# Patient Record
Sex: Female | Born: 1950
Health system: Southern US, Community
[De-identification: ages and names within clinical notes are randomized; demographics above are authoritative.]

## PROBLEM LIST (undated history)

## (undated) DIAGNOSIS — I499 Cardiac arrhythmia, unspecified: Secondary | ICD-10-CM

## (undated) DIAGNOSIS — F329 Major depressive disorder, single episode, unspecified: Secondary | ICD-10-CM

## (undated) DIAGNOSIS — Z860100 Personal history of colon polyps, unspecified: Secondary | ICD-10-CM

## (undated) DIAGNOSIS — E119 Type 2 diabetes mellitus without complications: Secondary | ICD-10-CM

## (undated) DIAGNOSIS — I10 Essential (primary) hypertension: Secondary | ICD-10-CM

## (undated) DIAGNOSIS — N83201 Unspecified ovarian cyst, right side: Secondary | ICD-10-CM

## (undated) DIAGNOSIS — K069 Disorder of gingiva and edentulous alveolar ridge, unspecified: Secondary | ICD-10-CM

## (undated) DIAGNOSIS — H269 Unspecified cataract: Secondary | ICD-10-CM

## (undated) DIAGNOSIS — R7303 Prediabetes: Secondary | ICD-10-CM

## (undated) DIAGNOSIS — F32A Depression, unspecified: Secondary | ICD-10-CM

## (undated) DIAGNOSIS — K219 Gastro-esophageal reflux disease without esophagitis: Secondary | ICD-10-CM

## (undated) DIAGNOSIS — R011 Cardiac murmur, unspecified: Secondary | ICD-10-CM

## (undated) DIAGNOSIS — K760 Fatty (change of) liver, not elsewhere classified: Secondary | ICD-10-CM

## (undated) DIAGNOSIS — Z8619 Personal history of other infectious and parasitic diseases: Secondary | ICD-10-CM

## (undated) DIAGNOSIS — D649 Anemia, unspecified: Secondary | ICD-10-CM

## (undated) DIAGNOSIS — M1990 Unspecified osteoarthritis, unspecified site: Secondary | ICD-10-CM

## (undated) DIAGNOSIS — I809 Phlebitis and thrombophlebitis of unspecified site: Secondary | ICD-10-CM

## (undated) DIAGNOSIS — T7840XA Allergy, unspecified, initial encounter: Secondary | ICD-10-CM

## (undated) HISTORY — DX: Fatty (change of) liver, not elsewhere classified: K76.0

## (undated) HISTORY — PX: CYSTECTOMY: SUR359

## (undated) HISTORY — DX: Unspecified osteoarthritis, unspecified site: M19.90

## (undated) HISTORY — DX: Unspecified cataract: H26.9

## (undated) HISTORY — DX: Disorder of gingiva and edentulous alveolar ridge, unspecified: K06.9

## (undated) HISTORY — DX: Depression, unspecified: F32.A

## (undated) HISTORY — DX: Gastro-esophageal reflux disease without esophagitis: K21.9

## (undated) HISTORY — DX: Cardiac murmur, unspecified: R01.1

## (undated) HISTORY — DX: Personal history of colonic polyps: Z86.010

## (undated) HISTORY — DX: Prediabetes: R73.03

## (undated) HISTORY — DX: Major depressive disorder, single episode, unspecified: F32.9

## (undated) HISTORY — DX: Personal history of other infectious and parasitic diseases: Z86.19

## (undated) HISTORY — DX: Personal history of colon polyps, unspecified: Z86.0100

## (undated) HISTORY — DX: Allergy, unspecified, initial encounter: T78.40XA

## (undated) HISTORY — DX: Unspecified ovarian cyst, right side: N83.201

## (undated) HISTORY — PX: THERAPEUTIC ABORTION: SHX798

---

## 1999-08-24 ENCOUNTER — Emergency Department (HOSPITAL_COMMUNITY): Admission: EM | Admit: 1999-08-24 | Discharge: 1999-08-24 | Payer: Self-pay | Admitting: Emergency Medicine

## 2001-02-25 ENCOUNTER — Emergency Department (HOSPITAL_COMMUNITY): Admission: EM | Admit: 2001-02-25 | Discharge: 2001-02-26 | Payer: Self-pay | Admitting: Emergency Medicine

## 2002-04-16 ENCOUNTER — Encounter: Admission: RE | Admit: 2002-04-16 | Discharge: 2002-04-16 | Payer: Self-pay | Admitting: Internal Medicine

## 2002-04-16 ENCOUNTER — Encounter: Payer: Self-pay | Admitting: Internal Medicine

## 2002-04-30 ENCOUNTER — Encounter: Admission: RE | Admit: 2002-04-30 | Discharge: 2002-04-30 | Payer: Self-pay | Admitting: Internal Medicine

## 2002-04-30 ENCOUNTER — Encounter: Payer: Self-pay | Admitting: Internal Medicine

## 2003-05-14 ENCOUNTER — Encounter: Admission: RE | Admit: 2003-05-14 | Discharge: 2003-05-14 | Payer: Self-pay | Admitting: Internal Medicine

## 2004-05-16 ENCOUNTER — Encounter: Admission: RE | Admit: 2004-05-16 | Discharge: 2004-05-16 | Payer: Self-pay | Admitting: Internal Medicine

## 2005-05-17 ENCOUNTER — Encounter: Admission: RE | Admit: 2005-05-17 | Discharge: 2005-05-17 | Payer: Self-pay | Admitting: Internal Medicine

## 2007-11-15 ENCOUNTER — Ambulatory Visit (HOSPITAL_COMMUNITY): Admission: RE | Admit: 2007-11-15 | Discharge: 2007-11-15 | Payer: Self-pay | Admitting: Family Medicine

## 2007-11-15 ENCOUNTER — Ambulatory Visit: Payer: Self-pay | Admitting: Family Medicine

## 2007-11-15 LAB — CONVERTED CEMR LAB
ALT: 25 units/L (ref 0–35)
AST: 23 units/L (ref 0–37)
Albumin: 4.7 g/dL (ref 3.5–5.2)
Alkaline Phosphatase: 77 units/L (ref 39–117)
BUN: 8 mg/dL (ref 6–23)
Basophils Absolute: 0 10*3/uL (ref 0.0–0.1)
Basophils Relative: 0 % (ref 0–1)
CO2: 24 meq/L (ref 19–32)
Calcium: 9.2 mg/dL (ref 8.4–10.5)
Chloride: 107 meq/L (ref 96–112)
Cholesterol: 86 mg/dL (ref 0–200)
Creatinine, Ser: 0.81 mg/dL (ref 0.40–1.20)
Eosinophils Absolute: 0.1 10*3/uL (ref 0.0–0.7)
Eosinophils Relative: 3 % (ref 0–5)
Free T4: 1.14 ng/dL (ref 0.89–1.80)
Glucose, Bld: 88 mg/dL (ref 70–99)
HCT: 42.7 % (ref 36.0–46.0)
HDL: 50 mg/dL (ref >39)
Hemoglobin: 13.5 g/dL (ref 12.0–15.0)
LDL Cholesterol: 10 mg/dL (ref 0–99)
Lymphocytes Relative: 37 % (ref 12–46)
Lymphs Abs: 1.7 10*3/uL (ref 0.7–4.0)
MCHC: 31.6 g/dL (ref 30.0–36.0)
MCV: 88 fL (ref 78.0–100.0)
Monocytes Absolute: 0.4 10*3/uL (ref 0.1–1.0)
Monocytes Relative: 9 % (ref 3–12)
Neutro Abs: 2.4 10*3/uL (ref 1.7–7.7)
Neutrophils Relative %: 52 % (ref 43–77)
Platelets: 207 10*3/uL (ref 150–400)
Potassium: 3.7 meq/L (ref 3.5–5.3)
RBC: 4.85 M/uL (ref 3.87–5.11)
RDW: 14.2 % (ref 11.5–15.5)
Sodium: 142 meq/L (ref 135–145)
TSH: 2.556 microintl units/mL (ref 0.350–4.50)
Total Bilirubin: 0.4 mg/dL (ref 0.3–1.2)
Total CHOL/HDL Ratio: 1.7
Total Protein: 7.3 g/dL (ref 6.0–8.3)
Triglycerides: 130 mg/dL (ref <150)
VLDL: 26 mg/dL (ref 0–40)
WBC: 4.7 10*3/uL (ref 4.0–10.5)

## 2008-09-18 ENCOUNTER — Encounter: Admission: RE | Admit: 2008-09-18 | Discharge: 2008-09-18 | Payer: Self-pay | Admitting: Family Medicine

## 2009-12-24 ENCOUNTER — Encounter: Admission: RE | Admit: 2009-12-24 | Discharge: 2009-12-24 | Payer: Self-pay | Admitting: Family Medicine

## 2010-11-25 ENCOUNTER — Emergency Department (HOSPITAL_BASED_OUTPATIENT_CLINIC_OR_DEPARTMENT_OTHER)
Admission: EM | Admit: 2010-11-25 | Discharge: 2010-11-25 | Disposition: A | Discharge status: Home / Self Care | Payer: BC Managed Care – PPO | Attending: Emergency Medicine | Admitting: Emergency Medicine

## 2010-11-25 ENCOUNTER — Emergency Department (INDEPENDENT_AMBULATORY_CARE_PROVIDER_SITE_OTHER): Payer: BC Managed Care – PPO

## 2010-11-25 ENCOUNTER — Encounter: Payer: Self-pay | Admitting: Family Medicine

## 2010-11-25 ENCOUNTER — Other Ambulatory Visit: Payer: Self-pay

## 2010-11-25 DIAGNOSIS — E876 Hypokalemia: Secondary | ICD-10-CM

## 2010-11-25 DIAGNOSIS — R000 Tachycardia, unspecified: Secondary | ICD-10-CM | POA: Insufficient documentation

## 2010-11-25 DIAGNOSIS — R079 Chest pain, unspecified: Secondary | ICD-10-CM

## 2010-11-25 DIAGNOSIS — R002 Palpitations: Secondary | ICD-10-CM

## 2010-11-25 HISTORY — DX: Cardiac arrhythmia, unspecified: I49.9

## 2010-11-25 HISTORY — DX: Phlebitis and thrombophlebitis of unspecified site: I80.9

## 2010-11-25 HISTORY — DX: Essential (primary) hypertension: I10

## 2010-11-25 LAB — BASIC METABOLIC PANEL
BUN: 10 mg/dL (ref 6–23)
CO2: 28 mEq/L (ref 19–32)
Calcium: 10 mg/dL (ref 8.4–10.5)
Chloride: 99 mEq/L (ref 96–112)
Creatinine, Ser: 0.8 mg/dL (ref 0.50–1.10)
Glucose, Bld: 146 mg/dL — ABNORMAL HIGH (ref 70–99)
Potassium: 2.9 mEq/L — ABNORMAL LOW (ref 3.5–5.1)
Sodium: 140 mEq/L (ref 135–145)

## 2010-11-25 LAB — CARDIAC PANEL(CRET KIN+CKTOT+MB+TROPI)
CK, MB: 1.7 ng/mL (ref 0.3–4.0)
Relative Index: 1.5 (ref 0.0–2.5)

## 2010-11-25 LAB — CBC
Hemoglobin: 12.4 g/dL (ref 12.0–15.0)
MCHC: 33.5 g/dL (ref 30.0–36.0)
MCV: 83 fL (ref 78.0–100.0)
RBC: 4.46 MIL/uL (ref 3.87–5.11)

## 2010-11-25 MED ORDER — POTASSIUM CHLORIDE CRYS ER 20 MEQ PO TBCR
40.0000 meq | EXTENDED_RELEASE_TABLET | Freq: Once | ORAL | Status: AC
  Administered 2010-11-25: 40 meq via ORAL
  Filled 2010-11-25: qty 2

## 2010-11-25 MED ORDER — POTASSIUM CHLORIDE CRYS ER 20 MEQ PO TBCR: 20.0000 meq | EXTENDED_RELEASE_TABLET | Freq: Every day | ORAL | Status: DC

## 2010-11-25 NOTE — ED Provider Notes (Addendum)
History     CSN: [id number] Arrival date & time: 11/25/2010  3:48 PM  Chief Complaint  Patient presents with  . Tachycardia    (Consider location/radiation/quality/duration/timing/severity/associated sxs/prior treatment) HPI Comments: About one month ago, pt's lisinopril/HCTZ was increased, has felt more tired, dizzy at times.  One week ago, started metoprolol.  Has had these feelings that her heart is racing since then.  Usually, has no associated symptoms.  Had one episode of chest tightness today with palpitations  Patient is a 60 y.o. female presenting with palpitations.  Palpitations  This is a new problem. The current episode started more than 2 days ago. The problem occurs hourly. The problem has been gradually worsening. Associated with: since starting labetalol about a week ago. Associated symptoms include malaise/fatigue, chest pressure, lower extremity edema and shortness of breath. Pertinent negatives include no diaphoresis, no fever, no numbness, no chest pain, no near-syncope, no syncope, no abdominal pain, no nausea, no vomiting, no headaches, no back pain, no leg pain, no dizziness, no weakness, no cough and no hemoptysis. Associated symptoms comments: Pt usually has had no symptoms associated with the palpitations, today, around lunch, had one episode of chest tightness with racing heart. She has tried nothing for the symptoms. Risk factors include smoking/tobacco exposure (quit smoking one year ago). Her past medical history does not include heart disease.    Past Medical History  Diagnosis Date  . Hypertension   . Phlebitis   . Irregular heart beat     Past Surgical History  Procedure Date  . Therapeutic abortion     No family history on file.  History  Substance Use Topics  . Smoking status: Former Games developer  . Smokeless tobacco: Not on file  . Alcohol Use: No    OB History    Grav Para Term Preterm Abortions TAB SAB Ect Mult Living                   Review of Systems  Constitutional: Positive for malaise/fatigue and fatigue. Negative for fever, chills and diaphoresis.  HENT: Negative for congestion, rhinorrhea and sneezing.   Eyes: Negative.   Respiratory: Positive for chest tightness and shortness of breath. Negative for cough and hemoptysis.        SOB at baseline  Cardiovascular: Positive for palpitations and leg swelling. Negative for chest pain, syncope and near-syncope.       Chronic leg edema, better than it usually is  Gastrointestinal: Negative for nausea, vomiting, abdominal pain, diarrhea and blood in stool.  Genitourinary: Negative for frequency, hematuria, flank pain and difficulty urinating.  Musculoskeletal: Negative for back pain and arthralgias.  Skin: Negative for rash.  Neurological: Negative for dizziness, speech difficulty, weakness, numbness and headaches.    Allergies  Review of patient's allergies indicates no known allergies.  Home Medications   Current Outpatient Rx  Name Route Sig Dispense Refill  . ASPIRIN 81 MG PO CHEW Oral Chew 81 mg by mouth daily.      Marland Kitchen CLONIDINE HCL 0.1 MG PO TABS Oral Take 0.1 mg by mouth at bedtime.      . IRON 240 (27 FE) MG PO TABS Oral Take 1 tablet by mouth daily.      Marland Kitchen LISINOPRIL-HYDROCHLOROTHIAZIDE 20-25 MG PO TABS Oral Take 1 tablet by mouth daily.      Marland Kitchen METOPROLOL TARTRATE 25 MG PO TABS Oral Take 25 mg by mouth daily.      Marland Kitchen POTASSIUM CHLORIDE CRYS CR 20 MEQ PO  TBCR Oral Take 1 tablet (20 mEq total) by mouth daily. 5 tablet 0    BP 132/72  Pulse 75  Temp(Src) 97.9 F (36.6 C) (Oral)  Resp 16  Ht 5\' 2"  (1.575 m)  Wt 183 lb (83.008 kg)  BMI 33.47 kg/m2  SpO2 100%  Physical Exam  Constitutional: She is oriented to person, place, and time. She appears well-developed and well-nourished.  HENT:  Head: Normocephalic and atraumatic.  Eyes: Pupils are equal, round, and reactive to light.  Neck: Normal range of motion. Neck supple.  Cardiovascular: Normal  rate, regular rhythm and normal heart sounds.   Pulmonary/Chest: Effort normal and breath sounds normal. No respiratory distress. She has no wheezes. She has no rales. She exhibits no tenderness.  Abdominal: Soft. Bowel sounds are normal. There is no tenderness. There is no rebound and no guarding.  Musculoskeletal: Normal range of motion. She exhibits edema. She exhibits no tenderness.  Lymphadenopathy:    She has no cervical adenopathy.  Neurological: She is alert and oriented to person, place, and time.  Skin: Skin is warm and dry. No rash noted.  Psychiatric: She has a normal mood and affect.    ED Course  Procedures (including critical care time)  Labs Reviewed  BASIC METABOLIC PANEL - Abnormal; Notable for the following:    Potassium 2.9 (*)    Glucose, Bld 146 (*)    All other components within normal limits  CBC  CARDIAC PANEL(CRET KIN+CKTOT+MB+TROPI)   Dg Chest 2 View  11/25/2010  *RADIOLOGY REPORT*  Clinical Data: Chest pain  CHEST - 2 VIEW  Comparison: 11/15/2007  Findings: Lungs are under aerated with hypoaeration change at the lung bases.  Heart is upper normal in size.  No pneumothorax or pleural effusion.  No consolidation or mass.  IMPRESSION: Bibasilar atelectasis.  Original Report Authenticated By: Donavan Burnet, M.D.     1. Heart palpitations   2. Hypokalemia       MDM    Spoke with Dr. Yetta Barre at [facility]/urgent care.  He is going to fax over an EKG for [place] to compare.  Assuming that there are no changes, since pt only had one episode of chest tightness which was associated with palpitations and all other episodes have been asymptomatic, feel that pt can be managed as an outpt, discussed this with Dr. Yetta Barre who agrees, will have pt call office on Monday.    Got old EKG, todays appears to be unchanged.  No ischemic changes noted.  Doubt ACS. Could be arrythmia, but no evidence of that today, has been typically asymptomatic. Nothing to suggest PE.   Could be related to hypokalemia.  Advised Dr. Yetta Barre of this, will follow up in office to have rechecked    Results for orders placed during the hospital encounter of 11/25/10  CBC      Component Value Range   WBC 5.3  4.0 - 10.5 (K/uL)   RBC 4.46  3.87 - 5.11 (MIL/uL)   Hemoglobin 12.4  12.0 - 15.0 (g/dL)   HCT 16.1  09.6 - 04.5 (%)   MCV 83.0  78.0 - 100.0 (fL)   MCH 27.8  26.0 - 34.0 (pg)   MCHC 33.5  30.0 - 36.0 (g/dL)   RDW 40.9  81.1 - 91.4 (%)   Platelets 197  150 - 400 (K/uL)  BASIC METABOLIC PANEL      Component Value Range   Sodium 140  135 - 145 (mEq/L)   Potassium  2.9 (*) 3.5 - 5.1 (mEq/L)   Chloride 99  96 - 112 (mEq/L)   CO2 28  19 - 32 (mEq/L)   Glucose, Bld 146 (*) 70 - 99 (mg/dL)   BUN 10  6 - 23 (mg/dL)   Creatinine, Ser 1.61  0.50 - 1.10 (mg/dL)   Calcium 09.6  8.4 - 10.5 (mg/dL)   GFR calc non Af Amer >60  >60 (mL/min)   GFR calc Af Amer >60  >60 (mL/min)  CARDIAC PANEL(CRET KIN+CKTOT+MB+TROPI)      Component Value Range   Total CK 117  7 - 177 (U/L)   CK, MB 1.7  0.3 - 4.0 (ng/mL)   Troponin I <0.30  <0.30 (ng/mL)   Relative Index 1.5  0.0 - 2.5    Dg Chest 2 View  11/25/2010  *RADIOLOGY REPORT*  Clinical Data: Chest pain  CHEST - 2 VIEW  Comparison: 11/15/2007  Findings: Lungs are under aerated with hypoaeration change at the lung bases.  Heart is upper normal in size.  No pneumothorax or pleural effusion.  No consolidation or mass.  IMPRESSION: Bibasilar atelectasis.  Original Report Authenticated By: Donavan Burnet, M.D.    Date: 11/25/2010  Rate: 76   Rhythm: normal sinus rhythm  QRS Axis: normal  Intervals: PR prolonged  ST/T Wave abnormalities: nonspecific ST changes  Conduction Disutrbances:first-degree A-V block  and right bundle branch block (incomplete)  Narrative Interpretation:   Old EKG Reviewed: unchanged     Rolan Bucco, MD 11/25/10 1710  Rolan Bucco, MD 11/25/10 1714

## 2010-11-25 NOTE — ED Notes (Addendum)
Pt c/o "feeling like my heart is racing for 2 weeks" and pt reports taking new medication recently. Pt sts "my heart also felt heavy earlier today but not right now". Pt also c/o "dizziness and light headedness x 2 weeks". Pt sts she left voice mail yesterday for PCP regarding symptoms.

## 2011-03-03 ENCOUNTER — Other Ambulatory Visit: Payer: Self-pay | Admitting: Family Medicine

## 2011-03-03 DIAGNOSIS — Z1231 Encounter for screening mammogram for malignant neoplasm of breast: Secondary | ICD-10-CM

## 2011-03-23 ENCOUNTER — Ambulatory Visit
Admission: RE | Admit: 2011-03-23 | Discharge: 2011-03-23 | Disposition: A | Discharge status: Home / Self Care | Payer: BC Managed Care – PPO | Source: Ambulatory Visit | Attending: Family Medicine | Admitting: Family Medicine

## 2011-03-23 DIAGNOSIS — Z1231 Encounter for screening mammogram for malignant neoplasm of breast: Secondary | ICD-10-CM

## 2011-09-25 ENCOUNTER — Ambulatory Visit (INDEPENDENT_AMBULATORY_CARE_PROVIDER_SITE_OTHER): Payer: BC Managed Care – PPO | Admitting: Family

## 2011-09-25 ENCOUNTER — Encounter: Payer: Self-pay | Admitting: Family

## 2011-09-25 VITALS — BP 122/80 | HR 72 | Temp 97.6°F | Resp 16 | Ht 63.0 in | Wt 171.0 lb

## 2011-09-25 DIAGNOSIS — F329 Major depressive disorder, single episode, unspecified: Secondary | ICD-10-CM | POA: Insufficient documentation

## 2011-09-25 DIAGNOSIS — Z8619 Personal history of other infectious and parasitic diseases: Secondary | ICD-10-CM

## 2011-09-25 DIAGNOSIS — M1990 Unspecified osteoarthritis, unspecified site: Secondary | ICD-10-CM | POA: Insufficient documentation

## 2011-09-25 DIAGNOSIS — E039 Hypothyroidism, unspecified: Secondary | ICD-10-CM

## 2011-09-25 DIAGNOSIS — I10 Essential (primary) hypertension: Secondary | ICD-10-CM

## 2011-09-25 DIAGNOSIS — E876 Hypokalemia: Secondary | ICD-10-CM

## 2011-09-25 DIAGNOSIS — M129 Arthropathy, unspecified: Secondary | ICD-10-CM

## 2011-09-25 LAB — BASIC METABOLIC PANEL
Creat: 0.77 mg/dL (ref 0.50–1.10)
Sodium: 138 mEq/L (ref 135–145)

## 2011-09-25 MED ORDER — METOPROLOL TARTRATE 25 MG PO TABS: 25.0000 mg | ORAL_TABLET | Freq: Every day | ORAL | Status: DC

## 2011-09-25 MED ORDER — LISINOPRIL-HYDROCHLOROTHIAZIDE 20-12.5 MG PO TABS: 2.0000 | ORAL_TABLET | Freq: Every day | ORAL | Status: DC

## 2011-09-25 NOTE — Assessment & Plan Note (Signed)
This is an ongoing issue for the patient.  Consider checking RA next visit.  Pt thinks she has been tested in the past but not sure.

## 2011-09-25 NOTE — Patient Instructions (Addendum)
Please complete your blood work prior to leaving.  Schedule a fasting physical at the front desk. Welcome to [facility]!
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
## 2011-09-25 NOTE — Assessment & Plan Note (Signed)
This is currently stable.  Pt is not on medications.  Monitor.

## 2011-09-25 NOTE — Assessment & Plan Note (Signed)
BP good today.  Continue current meds.  Obtain BMET to evaluate potassium and kidney function.

## 2011-09-26 ENCOUNTER — Encounter: Payer: Self-pay | Admitting: Family

## 2011-10-19 ENCOUNTER — Telehealth: Payer: Self-pay | Admitting: Family

## 2011-10-19 NOTE — Telephone Encounter (Signed)
Received medical records from [facility] ° °P: [phone] °F: [phone] °

## 2011-10-24 ENCOUNTER — Encounter: Payer: Self-pay | Admitting: Family

## 2011-10-24 DIAGNOSIS — E039 Hypothyroidism, unspecified: Secondary | ICD-10-CM | POA: Insufficient documentation

## 2011-10-24 DIAGNOSIS — Z8639 Personal history of other endocrine, nutritional and metabolic disease: Secondary | ICD-10-CM | POA: Insufficient documentation

## 2011-10-24 NOTE — Assessment & Plan Note (Signed)
Noted to have mild elevation of TSH per Dr. Kandis Cocking records back in 2013.  Plan follow up TFT's at upcoming apt in September.

## 2011-10-25 ENCOUNTER — Encounter: Payer: BC Managed Care – PPO | Admitting: Family

## 2011-11-13 ENCOUNTER — Ambulatory Visit (INDEPENDENT_AMBULATORY_CARE_PROVIDER_SITE_OTHER): Payer: BC Managed Care – PPO | Admitting: Family

## 2011-11-13 ENCOUNTER — Telehealth: Payer: Self-pay | Admitting: Family

## 2011-11-13 ENCOUNTER — Encounter: Payer: Self-pay | Admitting: Gastroenterology

## 2011-11-13 ENCOUNTER — Encounter: Payer: Self-pay | Admitting: Family

## 2011-11-13 VITALS — BP 120/70 | HR 63 | Temp 97.9°F | Resp 18 | Ht 63.0 in | Wt 172.0 lb

## 2011-11-13 DIAGNOSIS — H9190 Unspecified hearing loss, unspecified ear: Secondary | ICD-10-CM

## 2011-11-13 DIAGNOSIS — D126 Benign neoplasm of colon, unspecified: Secondary | ICD-10-CM

## 2011-11-13 DIAGNOSIS — M2550 Pain in unspecified joint: Secondary | ICD-10-CM

## 2011-11-13 DIAGNOSIS — Z0000 Encounter for general adult medical examination without abnormal findings: Secondary | ICD-10-CM | POA: Insufficient documentation

## 2011-11-13 DIAGNOSIS — Z23 Encounter for immunization: Secondary | ICD-10-CM

## 2011-11-13 DIAGNOSIS — K635 Polyp of colon: Secondary | ICD-10-CM

## 2011-11-13 LAB — CBC WITH DIFFERENTIAL/PLATELET
Eosinophils Absolute: 0.1 10*3/uL (ref 0.0–0.7)
Eosinophils Relative: 2 % (ref 0–5)
HCT: 37.3 % (ref 36.0–46.0)
Hemoglobin: 12.6 g/dL (ref 12.0–15.0)
Lymphocytes Relative: 42 % (ref 12–46)
MCH: 27.5 pg (ref 26.0–34.0)
MCHC: 33.8 g/dL (ref 30.0–36.0)
Monocytes Absolute: 0.4 10*3/uL (ref 0.1–1.0)
Platelets: 240 10*3/uL (ref 150–400)
RDW: 14 % (ref 11.5–15.5)
WBC: 5.2 10*3/uL (ref 4.0–10.5)

## 2011-11-13 LAB — BASIC METABOLIC PANEL WITH GFR
BUN: 10 mg/dL (ref 6–23)
Calcium: 9.6 mg/dL (ref 8.4–10.5)
Chloride: 102 mEq/L (ref 96–112)
GFR, Est African American: >89 mL/min
Glucose, Bld: 95 mg/dL (ref 70–99)
Potassium: 3.7 mEq/L (ref 3.5–5.3)

## 2011-11-13 LAB — HEPATIC FUNCTION PANEL
AST: 16 U/L (ref 0–37)
Albumin: 4.6 g/dL (ref 3.5–5.2)
Bilirubin, Direct: 0.1 mg/dL (ref 0.0–0.3)
Total Bilirubin: 0.4 mg/dL (ref 0.3–1.2)
Total Protein: 7.4 g/dL (ref 6.0–8.3)

## 2011-11-13 LAB — TSH: TSH: 2.817 u[IU]/mL (ref 0.350–4.500)

## 2011-11-13 LAB — RHEUMATOID FACTOR: Rhuematoid fact SerPl-aCnc: <10 IU/mL (ref <=14)

## 2011-11-13 NOTE — Telephone Encounter (Signed)
Received medical records from [facility]  P: [phone] F: [phone]

## 2011-11-13 NOTE — Patient Instructions (Addendum)
Schedule bone density at the front desk. You will be contact about your referral to Gastroenterology for colonoscopy and to Audiology for hearing test. Please let us know if you have not heard back within 1 week about your referrals. Please complete your blood work prior to leaving. Please schedule a follow up appointment in 3 months.

## 2011-11-13 NOTE — Assessment & Plan Note (Signed)
Discussed importance of flu shot with patient.  Pt is agreeable to flu shot today.  Refer for colo, dexa.  Formal hearing screen.  Pap next year.  Mammo up to date. Encouraged regular exercise.  Add caltrate for bone health.

## 2011-11-13 NOTE — Progress Notes (Signed)
Subjective:    Patient ID: CAMYIA ERTMAN, female    DOB: 10-18-1950, 61 y.o.   MRN: [id number]  HPI  Pt here for fasting physical.  Pt declines flu vaccine.  Doesn't remember last tetanus, pneumovax or shingles vaccine. Up to date with mammogram (february 2013- normal). Last pap smear 1 yr ago (normal). Last colonoscopy 2005 (polyps), DEXA 10 yrs ago ?normal. She reports that she eats healthy diet- chicken, fish, salad.  Eats very little bread.     Review of Systems  Constitutional: Negative for unexpected weight change.  HENT: Positive for hearing loss and congestion.        + pain related to gum disease  Eyes: Negative for visual disturbance.  Respiratory: Negative for cough.   Cardiovascular: Negative for chest pain.  Gastrointestinal: Negative for nausea and vomiting.  Genitourinary: Negative for dysuria and frequency.  Musculoskeletal: Positive for joint swelling.  Skin: Negative for rash.  Neurological: Negative for headaches.  Hematological: Does not bruise/bleed easily.  Psychiatric/Behavioral:       Depression is well controlled   Past Medical History  Diagnosis Date  . Hypertension   . Phlebitis   . Irregular heart beat   . Arthritis   . Depression   . Personal history of colonic polyps   . Heart murmur   . History of syphilis   . Gum disease     History   Social History  . Marital Status: Divorced    Spouse Name: N/A    Number of Children: 1  . Years of Education: N/A   Occupational History  . Not on file.   Social History Main Topics  . Smoking status: Former Smoker    Types: Cigarettes  . Smokeless tobacco: Never Used  . Alcohol Use: No  . Drug Use: No  . Sexually Active: Not on file   Other Topics Concern  . 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 Therapeutic abortion 1970. 1974, 1978    Family History  Problem Relation Age of Onset  . Diabetes Mother   . Cancer Father     lung cancer  . Kidney disease Father   . Seizures Father   . Cancer Brother     colon  . Heart attack Brother   . Stroke Brother     No Known Allergies  Current Outpatient Prescriptions on File Prior to Visit  Medication Sig Dispense Refill  . aspirin 81 MG chewable tablet Chew 81 mg by mouth daily.        . Calcium Carbonate-Vitamin D (CALTRATE 600+D) 600-400 MG-UNIT per tablet Take 1 tablet by mouth 2 (two) times daily.      Marland Kitchen diltiazem (CARDIZEM CD) 180 MG 24 hr capsule Take 180 mg by mouth daily.      . Ferrous Gluconate (IRON) 240 (27 FE) MG TABS Take 1 tablet by mouth daily.        Marland Kitchen lisinopril-hydrochlorothiazide (PRINZIDE,ZESTORETIC) 20-12.5 MG per tablet Take 2 tablets by mouth daily.  60 tablet  2  . metoprolol tartrate (LOPRESSOR) 25 MG tablet Take 1 tablet (25 mg total) by mouth daily.  30 tablet  2    BP 120/70  Pulse 63  Temp 97.9 F (36.6 C) (Oral)  Resp 18  Ht 5\' 3"  (1.6 m)  Wt 172 lb 0.6 oz (78.037 kg)  BMI 30.48 kg/m2  SpO2 99%       Objective:   Physical Exam  Physical Exam  Constitutional: She is oriented to person, place, and time. She appears well-developed and well-nourished. No distress.  HENT:  Head: Normocephalic and atraumatic.  Right Ear: Tympanic membrane and ear canal normal.  Left Ear: Tympanic membrane and ear canal normal.  Mouth/Throat: Oropharynx is clear and moist.  Eyes: Pupils are equal, round, and reactive to light. No scleral icterus.  Neck: Normal range of motion. No thyromegaly present.  Cardiovascular: Normal rate and regular rhythm.   No murmur heard. Pulmonary/Chest: Effort normal and breath sounds normal. No  respiratory distress. He has no wheezes. She has no rales. She exhibits no tenderness.  Abdominal: Soft. Bowel sounds are normal. He exhibits no distension and no mass. There is no tenderness. There is no rebound and no guarding.  Musculoskeletal: She exhibits no edema.  Lymphadenopathy:    She has no cervical adenopathy.  Neurological: She is alert and oriented to person, place, and time. She has normal reflexes. She exhibits normal muscle tone. Coordination normal.  Skin: Skin is warm and dry.  Psychiatric: She has a normal mood and affect. Her behavior is normal. Judgment and thought content normal.  Breasts: Examined lying Right: Without masses, retractions, discharge or axillary adenopathy.  Left: Without masses, retractions, discharge or axillary adenopathy. Inguinal/mons: Normal without inguinal adenopathy    Assessment & Plan:          Assessment & Plan:  EKG is reveiewed today and compared to 6/25 EKG reviewed by Dr. Alanda Amass. EKG notes sinus brady/1st degree AV block today.  Appears unchanged compared to prior.

## 2011-11-14 ENCOUNTER — Encounter: Payer: Self-pay | Admitting: Family

## 2011-11-14 LAB — URINALYSIS, ROUTINE W REFLEX MICROSCOPIC
Bilirubin Urine: NEGATIVE
Urobilinogen, UA: 0.2 mg/dL (ref 0.0–1.0)
pH: 6 (ref 5.0–8.0)

## 2011-11-14 LAB — URINALYSIS, MICROSCOPIC ONLY
Casts: NONE SEEN
Squamous Epithelial / LPF: NONE SEEN

## 2011-11-27 ENCOUNTER — Ambulatory Visit (INDEPENDENT_AMBULATORY_CARE_PROVIDER_SITE_OTHER)
Admission: RE | Admit: 2011-11-27 | Discharge: 2011-11-27 | Disposition: A | Discharge status: Home / Self Care | Payer: BC Managed Care – PPO | Source: Ambulatory Visit

## 2011-11-27 DIAGNOSIS — Z0000 Encounter for general adult medical examination without abnormal findings: Secondary | ICD-10-CM

## 2011-12-04 ENCOUNTER — Telehealth: Payer: Self-pay | Admitting: Family

## 2011-12-04 MED ORDER — METOPROLOL TARTRATE 25 MG PO TABS: 25.0000 mg | ORAL_TABLET | Freq: Every day | ORAL | Status: DC

## 2011-12-04 NOTE — Telephone Encounter (Signed)
Refill sent to [facility] x 2 refills.

## 2011-12-04 NOTE — Telephone Encounter (Signed)
Refill- metoprol tar 25mg  tab. Take one tablet by mouth every day. Qty 30 last fill 7.29.13

## 2011-12-05 ENCOUNTER — Telehealth: Payer: Self-pay | Admitting: *Deleted

## 2011-12-05 ENCOUNTER — Ambulatory Visit (AMBULATORY_SURGERY_CENTER): Payer: BC Managed Care – PPO | Admitting: *Deleted

## 2011-12-05 ENCOUNTER — Encounter: Payer: Self-pay | Admitting: Gastroenterology

## 2011-12-05 VITALS — Ht 62.5 in | Wt 176.2 lb

## 2011-12-05 DIAGNOSIS — Z1211 Encounter for screening for malignant neoplasm of colon: Secondary | ICD-10-CM

## 2011-12-05 MED ORDER — MOVIPREP 100 G PO SOLR: ORAL | Status: DC

## 2011-12-05 NOTE — Telephone Encounter (Signed)
Pt scheduled for colonoscopy with Dr. Christella Hartigan 10/23.  Last colonoscopy 5 to 7 years ago in [place].  Pt does not know where or the doctor's name that performed procedure.  She thinks she had polyps.  Pt will contact the pcp she had during that time and see if they have records.  Pt was instructed to call Chales Abrahams, CMA and let her know if she was able to locate records.  Release of information form signed and given to Chales Abrahams, CMA.

## 2011-12-05 NOTE — Progress Notes (Signed)
Pt scheduled for colonoscopy with Dr. Christella Hartigan 10/23.  Last colonoscopy 5 to 7 years ago in [place].  Pt does not know where or the doctor's name that performed procedure.  She thinks she had polyps.  Pt will contact the pcp she had during that time and see if they have records.  Pt was instructed to call Chales Abrahams, CMA and let her know if she was able to locate records.

## 2011-12-05 NOTE — Telephone Encounter (Signed)
Pt will call with the name of the facility we need to get records from

## 2011-12-18 ENCOUNTER — Telehealth: Payer: Self-pay | Admitting: Gastroenterology

## 2011-12-18 NOTE — Telephone Encounter (Signed)
Note not needed 

## 2011-12-19 ENCOUNTER — Encounter: Payer: Self-pay | Admitting: Family

## 2011-12-20 ENCOUNTER — Ambulatory Visit (AMBULATORY_SURGERY_CENTER): Payer: BC Managed Care – PPO | Admitting: Gastroenterology

## 2011-12-20 ENCOUNTER — Encounter: Payer: Self-pay | Admitting: Gastroenterology

## 2011-12-20 VITALS — BP 148/71 | HR 75 | Temp 96.3°F | Resp 16 | Ht 62.5 in | Wt 176.0 lb

## 2011-12-20 DIAGNOSIS — Z1211 Encounter for screening for malignant neoplasm of colon: Secondary | ICD-10-CM

## 2011-12-20 DIAGNOSIS — K644 Residual hemorrhoidal skin tags: Secondary | ICD-10-CM

## 2011-12-20 DIAGNOSIS — D126 Benign neoplasm of colon, unspecified: Secondary | ICD-10-CM

## 2011-12-20 MED ORDER — SODIUM CHLORIDE 0.9 % IV SOLN: 500.0000 mL | INTRAVENOUS | Status: DC

## 2011-12-20 NOTE — Patient Instructions (Addendum)

## 2011-12-20 NOTE — Progress Notes (Signed)
Patient did not experience any of the following events: a burn prior to discharge; a fall within the facility; wrong site/side/patient/procedure/implant event; or a hospital transfer or hospital admission upon discharge from the facility. (G8907) Patient did not have preoperative order for IV antibiotic SSI prophylaxis. (G8918)  

## 2011-12-20 NOTE — Op Note (Signed)
[facility] [address] [place], [address]   COLONOSCOPY PROCEDURE REPORT  PATIENT: Cassidy, Harrell  MR#: [id number] BIRTHDATE: Sep 23, 1950 , 60  yrs. old GENDER: Female ENDOSCOPIST: Rachael Fee, MD REFERRED GM:[id number] Peggyann Juba, FNP PROCEDURE DATE:  12/20/2011 PROCEDURE:   Colonoscopy with biopsy ASA CLASS:   Class III INDICATIONS:2005 colonoscopy with Dr.  Evette Cristal found 8mm TA. MEDICATIONS: Fentanyl 50 mcg IV, Versed 6 mg IV, and These medications were titrated to patient response per physician's verbal order  DESCRIPTION OF PROCEDURE:   After the risks benefits and alternatives of the procedure were thoroughly explained, informed consent was obtained.  A digital rectal exam revealed no abnormalities of the rectum.   The LB [id number]  endoscope was introduced through the anus and advanced to the cecum, which was identified by both the appendix and ileocecal valve. No adverse events experienced.   The quality of the prep was good, using MoviPrep  The instrument was then slowly withdrawn as the colon was fully examined.  COLON FINDINGS: There was a single small (1mm) polyp in transverse colon.  This was completely removed with biopsy forceps and sent to pathology (jar 1).  There were small external hemorrhoids.  The examination was otherwise normal.  Retroflexed views revealed no abnormalities. The time to cecum=1 minutes 28 seconds.  Withdrawal time=8 minutes 21 seconds.  The scope was withdrawn and the procedure completed. COMPLICATIONS: There were no complications.  ENDOSCOPIC IMPRESSION: There was a single small polyp, removed and sent to pathology) There were small external hemorrhoids. The examination was otherwise normal.  RECOMMENDATIONS: 1.  Given your personal history of adenomatous (pre-cancerous) polyps, you will need a repeat colonoscopy in 5 years even if the polyp removed today is NOT precancerous. 2.  You will receive a  letter within 1-2 weeks with the results of your biopsy as well as final recommendations.  Please call my office if you have not received a letter after 3 weeks.   eSigned:  Rachael Fee, MD 12/20/2011 9:41 AM

## 2011-12-20 NOTE — Progress Notes (Signed)
Pressure applied to the abdomen to reach the cecum 

## 2011-12-21 ENCOUNTER — Telehealth: Payer: Self-pay | Admitting: *Deleted

## 2011-12-21 NOTE — Telephone Encounter (Signed)
  Follow up Call-  Call back number 12/20/2011  Post procedure Call Back phone  # [phone]  Permission to leave phone message Yes     Patient questions:  Do you have a fever, pain , or abdominal swelling? no Pain Score  0 *  Have you tolerated food without any problems? yes  Have you been able to return to your normal activities? yes  Do you have any questions about your discharge instructions: Diet   no Medications  no Follow up visit  no  Do you have questions or concerns about your Care? no  Actions: * If pain score is 4 or above: No action needed, pain <4.

## 2011-12-26 ENCOUNTER — Encounter: Payer: Self-pay | Admitting: Gastroenterology

## 2012-01-01 ENCOUNTER — Other Ambulatory Visit: Payer: Self-pay | Admitting: Family

## 2012-01-29 ENCOUNTER — Telehealth: Payer: Self-pay | Admitting: Family

## 2012-01-29 MED ORDER — METOPROLOL TARTRATE 25 MG PO TABS: 25.0000 mg | ORAL_TABLET | Freq: Every day | ORAL | Status: DC

## 2012-01-29 NOTE — Telephone Encounter (Signed)
Refill- metoprol tar 25mg tab. Take one tablet by mouth every day. Qty 30 last fill 7.29.13 °

## 2012-01-29 NOTE — Telephone Encounter (Signed)
Pharmacy states they did not receive refills from [place] as noted in EPIC on 12/04/11; #30 x 2 refills. Verbal given to [place] at [place].

## 2012-02-07 ENCOUNTER — Encounter: Payer: Self-pay | Admitting: Family

## 2012-02-07 ENCOUNTER — Ambulatory Visit (INDEPENDENT_AMBULATORY_CARE_PROVIDER_SITE_OTHER): Payer: BC Managed Care – PPO | Admitting: Family

## 2012-02-07 VITALS — BP 130/72 | HR 69 | Temp 98.0°F | Resp 16 | Ht 63.0 in | Wt 177.1 lb

## 2012-02-07 DIAGNOSIS — F3289 Other specified depressive episodes: Secondary | ICD-10-CM

## 2012-02-07 DIAGNOSIS — E039 Hypothyroidism, unspecified: Secondary | ICD-10-CM

## 2012-02-07 DIAGNOSIS — I10 Essential (primary) hypertension: Secondary | ICD-10-CM

## 2012-02-07 DIAGNOSIS — F329 Major depressive disorder, single episode, unspecified: Secondary | ICD-10-CM

## 2012-02-07 NOTE — Assessment & Plan Note (Signed)
BP remains well controlled on lisinopril-HCTZ.

## 2012-02-07 NOTE — Assessment & Plan Note (Signed)
Well controlled off of meds.  Monitor.

## 2012-02-07 NOTE — Progress Notes (Signed)
Subjective:    Patient ID: Cassidy Harrell, female    DOB: [birth year], 61 y.o.   MRN: [id number]  HPI  Cassidy Harrell is a 61 yr old female who presents today for follow up.  1) Depression- Denies depression.    2) HTN- tolerating meds. No CP/SOB or swelling.    3) Hypothyroid-TSH normal last visit.    Review of Systems See HPI  Past Medical History  Diagnosis Date  . Hypertension   . Phlebitis   . Irregular heart beat   . Arthritis   . Depression   . Personal history of colonic polyps   . Heart murmur   . History of syphilis   . Gum disease     History   Social History  . Marital Status: Divorced    Spouse Name: N/A    Number of Children: 1  . Years of Education: N/A   Occupational History  . Not on file.   Social History Main Topics  . Smoking status: Former Smoker    Types: Cigarettes  . Smokeless tobacco: Never Used  . Alcohol Use: No  . Drug Use: No  . Sexually Active: Not on file   Other Topics Concern  . 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 Therapeutic abortion 1970. 1974, 1978    Family History  Problem Relation Age of Onset  . Diabetes Mother   . Cancer Father     lung cancer  . Kidney disease Father   . Seizures Father   . Cancer Brother     colon  . Heart attack Brother   . Stroke Brother   . Colon cancer Neg Hx   . Stomach cancer Neg Hx     No Known Allergies  Current Outpatient Prescriptions on File Prior to Visit  Medication Sig Dispense Refill  . aspirin 81 MG chewable tablet Chew 81 mg by mouth daily.        . Calcium Carbonate-Vitamin D (CALTRATE 600+D) 600-400  MG-UNIT per tablet Take 1 tablet by mouth 2 (two) times daily.      Marland Kitchen diltiazem (CARDIZEM CD) 180 MG 24 hr capsule Take 180 mg by mouth daily.      . Ferrous Gluconate (IRON) 240 (27 FE) MG TABS Take 1 tablet by mouth daily.        Marland Kitchen lisinopril-hydrochlorothiazide (PRINZIDE,ZESTORETIC) 20-12.5 MG per tablet TAKE TWO TABLETS BY MOUTH EVERY DAY  60 tablet  2  . metoprolol tartrate (LOPRESSOR) 25 MG tablet Take 1 tablet (25 mg total) by mouth daily.  30 tablet  2    BP 130/72  Pulse 69  Temp 98 F (36.7 C) (Oral)  Resp 16  Ht 5\' 3"  (1.6 m)  Wt 177 lb 1.3 oz (80.323 kg)  BMI 31.37 kg/m2  SpO2 98%       Objective:   Physical Exam  Constitutional: She is oriented to person, place, and time. She appears well-developed and well-nourished. No distress.  HENT:  Head: Normocephalic and atraumatic.  Cardiovascular: Normal rate and regular rhythm.   No murmur heard. Pulmonary/Chest: Effort normal and breath sounds normal. No respiratory distress. She has  no wheezes. She has no rales. She exhibits no tenderness.  Musculoskeletal: She exhibits no edema.  Lymphadenopathy:    She has no cervical adenopathy.  Neurological: She is alert and oriented to person, place, and time.  Skin: Skin is warm and dry. No rash noted. No erythema. No pallor.  Psychiatric: She has a normal mood and affect. Her behavior is normal. Judgment and thought content normal.          Assessment & Plan:

## 2012-02-07 NOTE — Patient Instructions (Addendum)
Please follow up in 4 months

## 2012-02-07 NOTE — Assessment & Plan Note (Signed)
TSH was normal last visit.  She is not on medication.  Monitor.

## 2012-02-28 HISTORY — PX: SPINAL FUSION: SHX223

## 2012-03-12 ENCOUNTER — Other Ambulatory Visit: Payer: Self-pay | Admitting: Family

## 2012-03-12 DIAGNOSIS — Z1231 Encounter for screening mammogram for malignant neoplasm of breast: Secondary | ICD-10-CM

## 2012-03-25 ENCOUNTER — Ambulatory Visit: Payer: BC Managed Care – PPO

## 2012-03-26 ENCOUNTER — Ambulatory Visit
Admission: RE | Admit: 2012-03-26 | Discharge: 2012-03-26 | Disposition: A | Discharge status: Home / Self Care | Payer: BC Managed Care – PPO | Source: Ambulatory Visit | Attending: Family | Admitting: Family

## 2012-03-26 DIAGNOSIS — Z1231 Encounter for screening mammogram for malignant neoplasm of breast: Secondary | ICD-10-CM

## 2012-04-08 ENCOUNTER — Other Ambulatory Visit: Payer: Self-pay | Admitting: Family

## 2012-05-09 ENCOUNTER — Other Ambulatory Visit: Payer: Self-pay | Admitting: Family

## 2012-06-06 ENCOUNTER — Encounter: Payer: Self-pay | Admitting: Family

## 2012-06-06 ENCOUNTER — Ambulatory Visit (INDEPENDENT_AMBULATORY_CARE_PROVIDER_SITE_OTHER): Payer: BC Managed Care – PPO | Admitting: Family

## 2012-06-06 VITALS — BP 120/70 | HR 68 | Temp 97.8°F | Resp 16 | Ht 63.0 in | Wt 178.0 lb

## 2012-06-06 DIAGNOSIS — M5090 Cervical disc disorder, unspecified, unspecified cervical region: Secondary | ICD-10-CM | POA: Insufficient documentation

## 2012-06-06 DIAGNOSIS — R200 Anesthesia of skin: Secondary | ICD-10-CM

## 2012-06-06 DIAGNOSIS — R209 Unspecified disturbances of skin sensation: Secondary | ICD-10-CM

## 2012-06-06 MED ORDER — METHYLPREDNISOLONE 4 MG PO KIT: PACK | ORAL | Status: DC

## 2012-06-06 NOTE — Progress Notes (Signed)
Subjective:    Patient ID: Cassidy Harrell, female    DOB: [birth year], 62 y.o.   MRN: [id number]  HPI  Tingling of the right hand.  Occurs with reaching.  Occuring x 2 months.  Works on [employer], right handed. Does not wake up at night with hand asleep. She reports that she had testing for carpal tunnel testing and nerve conduction studies around 2000 and was told negative for carpal tunnel. She denies current problem with sciatic pain.     Review of Systems See HPI  Past Medical History  Diagnosis Date  . Hypertension   . Phlebitis   . Irregular heart beat   . Arthritis   . Depression   . Personal history of colonic polyps   . Heart murmur   . History of syphilis   . Gum disease     History   Social History  . Marital Status: Divorced    Spouse Name: N/A    Number of Children: 1  . Years of Education: N/A   Occupational History  . Not on file.   Social History Main Topics  . Smoking status: Former Smoker    Types: Cigarettes  . Smokeless tobacco: Never Used  . Alcohol Use: No  . Drug Use: No  . Sexually Active: Not on file   Other Topics Concern  . 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 Therapeutic abortion  1970. 1974, 1978    Family History  Problem Relation Age of Onset  . Diabetes Mother   . Cancer Father     lung cancer  . Kidney disease Father   . Seizures Father   . Cancer Brother     colon  . Heart attack Brother   . Stroke Brother   . Colon cancer Neg Hx   . Stomach cancer Neg Hx     No Known Allergies  Current Outpatient  Prescriptions on File Prior to Visit  Medication Sig Dispense Refill  . aspirin 81 MG chewable tablet Chew 81 mg by mouth daily.        . Calcium Carbonate-Vitamin D (CALTRATE 600+D) 600-400 MG-UNIT per tablet Take 1 tablet by mouth 2 (two) times daily.      Marland Kitchen diltiazem (CARDIZEM CD) 180 MG 24 hr capsule Take 180 mg by mouth daily.      . Ferrous Gluconate (IRON) 240 (27 FE) MG TABS Take 1 tablet by mouth daily.        Marland Kitchen lisinopril-hydrochlorothiazide (PRINZIDE,ZESTORETIC) 20-12.5 MG per tablet TAKE TWO TABLETS BY MOUTH EVERY DAY  60 tablet  1  . metoprolol tartrate (LOPRESSOR) 25 MG tablet TAKE ONE TABLET BY MOUTH EVERY DAY  30 tablet  2   No current facility-administered medications on file prior to visit.    BP 120/70  Pulse 68  Temp(Src) 97.8 F (36.6 C) (Oral)  Resp 16  Ht 5\' 3"  (1.6 m)  Wt 178 lb (80.74 kg)  BMI 31.54 kg/m2  SpO2 99%       Objective:   Physical Exam  Constitutional: She is oriented to person, place, and time. She appears well-developed and well-nourished. No distress.  Cardiovascular: Normal rate and regular rhythm.   No murmur heard. Pulmonary/Chest: Effort normal and breath sounds normal. No respiratory distress. She has no wheezes. She has no rales. She exhibits no tenderness.  Musculoskeletal: She exhibits no edema.  Neurological: She is alert and oriented to person, place, and time.  Bilateral hand grasps are 5/5, neg tinels, neg phalans Bilateral LE strength is 5/5  Psychiatric: She has a normal mood and affect. Her behavior is normal. Judgment and thought content normal.          Assessment & Plan:

## 2012-06-06 NOTE — Assessment & Plan Note (Addendum)
I am concerned about c-spine involvement > than carpal tunnel as her neck positioning seems to affect hand numbness. She also reports some burning that extends up the arm as well.  Trial of medrol dose pak. Refer for MRI of the C spine for further evaluation.

## 2012-06-06 NOTE — Patient Instructions (Addendum)
You will be contacted about your MRI. Call if numbness/pain worsens or if not improved in 1 week. Please schedule a follow up appointment in 1 month.

## 2012-06-08 ENCOUNTER — Ambulatory Visit (HOSPITAL_BASED_OUTPATIENT_CLINIC_OR_DEPARTMENT_OTHER)
Admission: RE | Admit: 2012-06-08 | Discharge: 2012-06-08 | Disposition: A | Discharge status: Home / Self Care | Payer: BC Managed Care – PPO | Source: Ambulatory Visit | Attending: Family | Admitting: Family

## 2012-06-08 ENCOUNTER — Telehealth: Payer: Self-pay | Admitting: Family

## 2012-06-08 DIAGNOSIS — R937 Abnormal findings on diagnostic imaging of other parts of musculoskeletal system: Secondary | ICD-10-CM

## 2012-06-08 DIAGNOSIS — R209 Unspecified disturbances of skin sensation: Secondary | ICD-10-CM | POA: Insufficient documentation

## 2012-06-08 DIAGNOSIS — M5020 Other cervical disc displacement, unspecified cervical region: Secondary | ICD-10-CM | POA: Insufficient documentation

## 2012-06-08 DIAGNOSIS — R200 Anesthesia of skin: Secondary | ICD-10-CM

## 2012-06-08 DIAGNOSIS — M47812 Spondylosis without myelopathy or radiculopathy, cervical region: Secondary | ICD-10-CM | POA: Insufficient documentation

## 2012-06-08 DIAGNOSIS — R29898 Other symptoms and signs involving the musculoskeletal system: Secondary | ICD-10-CM | POA: Insufficient documentation

## 2012-06-08 NOTE — Telephone Encounter (Signed)
See my chart message

## 2012-06-10 ENCOUNTER — Other Ambulatory Visit: Payer: Self-pay | Admitting: Family

## 2012-06-13 ENCOUNTER — Encounter: Payer: Self-pay | Admitting: Family

## 2012-06-14 NOTE — Telephone Encounter (Signed)
Could you please rquest last office note from Dr. Trudee Grip office?

## 2012-06-17 NOTE — Telephone Encounter (Signed)
Attempted to reach [facility] (Dr Gerlene Fee) at [phone] and reached answering service; will call back tomorrow.

## 2012-06-18 NOTE — Telephone Encounter (Signed)
Spoke with medical records re: consultation results from 06/12/12. Note has not been received from transcription yet per [place]. Check back within 1 week if we haven't received by then.

## 2012-06-27 ENCOUNTER — Encounter: Payer: Self-pay | Admitting: Family

## 2012-06-27 DIAGNOSIS — M5090 Cervical disc disorder, unspecified, unspecified cervical region: Secondary | ICD-10-CM

## 2012-06-28 ENCOUNTER — Telehealth: Payer: Self-pay | Admitting: *Deleted

## 2012-06-28 NOTE — Telephone Encounter (Signed)
Note received and forwarded to Provider for review. 

## 2012-06-28 NOTE — Telephone Encounter (Signed)
Spoke with [place] at Dr Felipe Drone office and requested recent consultation / lab notes as pt is requesting referral for a 2nd opinion. ([phone]). They will fax.

## 2012-07-04 ENCOUNTER — Encounter: Payer: Self-pay | Admitting: Family

## 2012-07-05 ENCOUNTER — Encounter: Payer: Self-pay | Admitting: Family

## 2012-07-05 ENCOUNTER — Ambulatory Visit (INDEPENDENT_AMBULATORY_CARE_PROVIDER_SITE_OTHER): Payer: BC Managed Care – PPO | Admitting: Family

## 2012-07-05 VITALS — BP 110/70 | HR 65 | Temp 98.0°F | Resp 16 | Ht 63.0 in | Wt 177.0 lb

## 2012-07-05 DIAGNOSIS — R202 Paresthesia of skin: Secondary | ICD-10-CM

## 2012-07-05 DIAGNOSIS — I10 Essential (primary) hypertension: Secondary | ICD-10-CM

## 2012-07-05 DIAGNOSIS — R209 Unspecified disturbances of skin sensation: Secondary | ICD-10-CM

## 2012-07-05 LAB — BASIC METABOLIC PANEL
CO2: 26 mEq/L (ref 19–32)
Chloride: 102 mEq/L (ref 96–112)
Creat: 0.82 mg/dL (ref 0.50–1.10)
Potassium: 3.7 mEq/L (ref 3.5–5.3)

## 2012-07-05 NOTE — Assessment & Plan Note (Signed)
BP looks great.  Continue lisinopril hct, obtain bmet.

## 2012-07-05 NOTE — Assessment & Plan Note (Signed)
Due to Cervical disc disease.  Symptoms are stable. We have placed referral for second opinion from neurosurgery.  Await recommendations.

## 2012-07-05 NOTE — Patient Instructions (Addendum)
Please complete your lab work prior to leaving. Follow up in 6 months.  

## 2012-07-05 NOTE — Progress Notes (Signed)
Subjective:    Patient ID: Cassidy Harrell, female    DOB: 01/01/1951, 62 y.o.   MRN: [id number]  HPI  Cassidy Harrell is a 62 yr old female who presents today for follow up of her right arm pain. She reports that she has continued numbness right hand, and now starting to have pain in the left arm. Finds taht she drops things easily.  MRI of the C spine last visit noted:  The dominant right-sided abnormality is at C4-5 where a central and rightward disc extrusion compresses the cord and right C5 nerve root. Abnormal cord signal is evident. Multilevel spondylosis with spinal stenosis, multifactorial in nature noted at C3-4, C5-6, and C6-7.   Pt saw Dr. Gerlene Fee who recommended surgery.  She is nervous about undergoing surgery and has requested a second opinion. We ar awaiting second opinion.    Review of Systems See HPI  Past Medical History  Diagnosis Date  . Hypertension   . Phlebitis   . Irregular heart beat   . Arthritis   . Depression   . Personal history of colonic polyps   . Heart murmur   . History of syphilis   . Gum disease     History   Social History  . Marital Status: Divorced    Spouse Name: N/A    Number of Children: 1  . Years of Education: N/A   Occupational History  . Not on file.   Social History Main Topics  . Smoking status: Former Smoker    Types: Cigarettes  . Smokeless tobacco: Never Used  . Alcohol Use: No  . Drug Use: No  . Sexually Active: Not on file   Other Topics Concern  . 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 Therapeutic  abortion  1970. 1974, 1978    Family History  Problem Relation Age of Onset  . Diabetes Mother   . Cancer Father     lung cancer  . Kidney disease Father   . Seizures Father   . Cancer Brother     colon  . Heart attack Brother   . Stroke Brother   . Colon cancer Neg Hx   . Stomach cancer Neg Hx     No Known Allergies  Current Outpatient Prescriptions on File Prior to Visit  Medication Sig Dispense Refill  . aspirin 81 MG chewable tablet Chew 81 mg by mouth daily.        . Calcium Carbonate-Vitamin D (CALTRATE 600+D) 600-400 MG-UNIT per tablet Take 1 tablet by mouth 2 (two) times daily.      Marland Kitchen diltiazem (CARDIZEM CD) 180 MG 24 hr capsule Take 180 mg by mouth daily.      . Ferrous Gluconate (IRON) 240 (27 FE) MG TABS Take 1 tablet by mouth daily.        Marland Kitchen lisinopril-hydrochlorothiazide (PRINZIDE,ZESTORETIC) 20-12.5 MG per tablet TAKE TWO TABLETS BY MOUTH EVERY DAY  60 tablet  0  . metoprolol tartrate (  LOPRESSOR) 25 MG tablet TAKE ONE TABLET BY MOUTH EVERY DAY  30 tablet  2   No current facility-administered medications on file prior to visit.    BP 110/70  Pulse 65  Temp(Src) 98 F (36.7 C) (Oral)  Resp 16  Ht 5\' 3"  (1.6 m)  Wt 177 lb 0.6 oz (80.305 kg)  BMI 31.37 kg/m2  SpO2 99%       Objective:   Physical Exam  Constitutional: She is oriented to person, place, and time. She appears well-developed and well-nourished. No distress.  Cardiovascular: Normal rate and regular rhythm.   No murmur heard. Pulmonary/Chest: Effort normal and breath sounds normal. No respiratory distress. She has no wheezes. She has no rales. She exhibits no tenderness.  Musculoskeletal: She exhibits no edema.  Neurological: She is alert and oriented to person, place, and time.  Bilateral UE strength is 5/5  Psychiatric: She has a normal mood and affect. Her behavior is normal. Judgment and thought content normal.          Assessment & Plan:

## 2012-07-09 ENCOUNTER — Encounter: Payer: Self-pay | Admitting: Family

## 2012-07-10 ENCOUNTER — Telehealth: Payer: Self-pay

## 2012-07-10 ENCOUNTER — Encounter: Payer: Self-pay | Admitting: Family

## 2012-07-10 DIAGNOSIS — R739 Hyperglycemia, unspecified: Secondary | ICD-10-CM

## 2012-07-10 LAB — HEMOGLOBIN A1C: Mean Plasma Glucose: 137 mg/dL — ABNORMAL HIGH (ref <117)

## 2012-07-10 NOTE — Telephone Encounter (Signed)
Labs ordered.

## 2012-07-12 ENCOUNTER — Other Ambulatory Visit: Payer: Self-pay | Admitting: Family

## 2012-07-16 ENCOUNTER — Encounter: Payer: Self-pay | Admitting: Family

## 2012-08-12 ENCOUNTER — Encounter: Payer: Self-pay | Admitting: Family

## 2012-08-12 MED ORDER — METOPROLOL TARTRATE 25 MG PO TABS: 25.0000 mg | ORAL_TABLET | Freq: Every day | ORAL | Status: DC

## 2012-08-12 MED ORDER — LISINOPRIL-HYDROCHLOROTHIAZIDE 20-12.5 MG PO TABS: 2.0000 | ORAL_TABLET | Freq: Every day | ORAL | Status: DC

## 2012-08-13 ENCOUNTER — Telehealth: Payer: Self-pay | Admitting: Family

## 2012-08-13 NOTE — Telephone Encounter (Signed)
Verified w/pharmacy that Rx sent 06.16.14 for this medication was received/SLS

## 2012-08-13 NOTE — Telephone Encounter (Signed)
metoprol tar 25 mg tablet qty 30 take 1 tablet by mouth once daily

## 2012-08-15 ENCOUNTER — Telehealth: Payer: Self-pay | Admitting: Cardiovascular Disease

## 2012-08-15 NOTE — Telephone Encounter (Signed)
Returned call.  Left message that refill sent on 6.16.14 and confirmed.  Pt or pharmacy can call w/ questions.

## 2012-08-15 NOTE — Telephone Encounter (Signed)
Mrs. Manfredi is calling because she called the Pharmacy( [facility] [address]) on Thursday of last week and the pharmacy said they faxed over the prescription to [place] and have not heard anything back as of it .Marland Kitchen It is for her Diltiazem..180 mg.. She has not hhad her medication in 3 days    Thanks

## 2012-08-16 ENCOUNTER — Telehealth: Payer: Self-pay | Admitting: *Deleted

## 2012-08-16 NOTE — Telephone Encounter (Signed)
Returned call.  Left message to call back before 4pm.  Pt called back and informed Rx has been corrected.  Pt aware and will pick up Rx from [facility] [address] since it has been filled and is ready.  Pt informed she will need an appt and unable to schedule appt now r/t "collection" warning.  Pt stated she does not owe any money and informed our financial services rep will be notified to contact her to clear this matter.  Pt verbalized understanding and agreed w/ plan.

## 2012-09-16 ENCOUNTER — Other Ambulatory Visit: Payer: Self-pay | Admitting: *Deleted

## 2012-09-16 ENCOUNTER — Other Ambulatory Visit: Payer: Self-pay | Admitting: Cardiovascular Disease

## 2012-09-16 DIAGNOSIS — R011 Cardiac murmur, unspecified: Secondary | ICD-10-CM

## 2012-09-16 LAB — COMPREHENSIVE METABOLIC PANEL
Albumin: 4.4 g/dL (ref 3.5–5.2)
BUN: 10 mg/dL (ref 6–23)
CO2: 28 mEq/L (ref 19–32)
Glucose, Bld: 101 mg/dL — ABNORMAL HIGH (ref 70–99)
Sodium: 138 mEq/L (ref 135–145)
Total Bilirubin: 0.3 mg/dL (ref 0.3–1.2)
Total Protein: 7.6 g/dL (ref 6.0–8.3)

## 2012-09-16 LAB — LIPID PANEL
Cholesterol: 88 mg/dL (ref 0–200)
HDL: 59 mg/dL (ref >39)
Triglycerides: 98 mg/dL (ref <150)
VLDL: 20 mg/dL (ref 0–40)

## 2012-09-26 ENCOUNTER — Ambulatory Visit (HOSPITAL_COMMUNITY)
Admission: RE | Admit: 2012-09-26 | Discharge: 2012-09-26 | Disposition: A | Discharge status: Home / Self Care | Payer: BC Managed Care – PPO | Source: Ambulatory Visit | Attending: Cardiovascular Disease | Admitting: Cardiovascular Disease

## 2012-09-26 DIAGNOSIS — R011 Cardiac murmur, unspecified: Secondary | ICD-10-CM | POA: Insufficient documentation

## 2012-09-26 NOTE — Progress Notes (Signed)
[facility]   2D echo completed 09/26/2012.   Veda Canning, RDCS

## 2012-09-30 ENCOUNTER — Encounter: Payer: Self-pay | Admitting: Cardiovascular Disease

## 2012-10-14 ENCOUNTER — Ambulatory Visit (INDEPENDENT_AMBULATORY_CARE_PROVIDER_SITE_OTHER): Payer: BC Managed Care – PPO | Admitting: Family

## 2012-10-14 ENCOUNTER — Encounter: Payer: Self-pay | Admitting: Family

## 2012-10-14 VITALS — BP 120/70 | HR 58 | Temp 97.6°F | Resp 16 | Ht 63.0 in | Wt 175.1 lb

## 2012-10-14 DIAGNOSIS — R7309 Other abnormal glucose: Secondary | ICD-10-CM

## 2012-10-14 DIAGNOSIS — R5381 Other malaise: Secondary | ICD-10-CM | POA: Insufficient documentation

## 2012-10-14 DIAGNOSIS — R7303 Prediabetes: Secondary | ICD-10-CM

## 2012-10-14 DIAGNOSIS — M5090 Cervical disc disorder, unspecified, unspecified cervical region: Secondary | ICD-10-CM

## 2012-10-14 DIAGNOSIS — I10 Essential (primary) hypertension: Secondary | ICD-10-CM

## 2012-10-14 DIAGNOSIS — E039 Hypothyroidism, unspecified: Secondary | ICD-10-CM

## 2012-10-14 LAB — CBC WITH DIFFERENTIAL/PLATELET
Eosinophils Absolute: 0.2 10*3/uL (ref 0.0–0.7)
Eosinophils Relative: 5 % (ref 0–5)
Hemoglobin: 12.3 g/dL (ref 12.0–15.0)
Lymphs Abs: 2 10*3/uL (ref 0.7–4.0)
MCH: 27.5 pg (ref 26.0–34.0)
MCV: 82.8 fL (ref 78.0–100.0)
Monocytes Relative: 9 % (ref 3–12)
RBC: 4.48 MIL/uL (ref 3.87–5.11)

## 2012-10-14 NOTE — Assessment & Plan Note (Signed)
Clinically stable. Not exercising but working on diet.  We discussed trying to add regular exercise.  Obtain follow up A1C.

## 2012-10-14 NOTE — Assessment & Plan Note (Signed)
BP stable on current meds. Continue same.  

## 2012-10-14 NOTE — Assessment & Plan Note (Signed)
Will check CBC along with follow up TSH.  If symptoms persist and lab work unremarkable, consider sleep study to rule out OSA.

## 2012-10-14 NOTE — Assessment & Plan Note (Signed)
She would like to proceed with surgery but cannot afford at this time.

## 2012-10-14 NOTE — Assessment & Plan Note (Signed)
Will repeat TSH today 

## 2012-10-14 NOTE — Patient Instructions (Addendum)
Please complete your lab work prior to leaving. Follow up in 3 months.   

## 2012-10-14 NOTE — Progress Notes (Signed)
Subjective:    Patient ID: Cassidy Harrell, female    DOB: 20-Apr-1950, 62 y.o.   MRN: [id number]  HPI  Cassidy Harrell is a 62 yr old female who presents today for follow up of multiple medical problems:  1) Hypothyroid- currently not on synthroid. Her last TSH drawn last year was normal.   2) HTN- currently maintainde on lisinopril-hctz, metoprolol and diltiazem. She denies CP/SOB or swelling.   3) Borderline DM-  This is currently managed with diet. Her last A1C performed in 6/13 was 6.4.  Reports that she has been watching her diet.    4) Extreme fatigue- pt reports a 3-4 month history of extreme fatigue. She reports weight is stable. She does snore.    5) Cervical disc disease-She did  Have a second opinion on cervical disc disease.  The second opinion also agreed with need for surgery (cervical discectomy) per pt.  I have not seen a copy of this report.  Pt would like to proceed but at this time she cannot afford to take the time of from work which is required for rehabilitation.  Review of Systems See HPI  Past Medical History  Diagnosis Date  . Hypertension   . Phlebitis   . Irregular heart beat   . Arthritis   . Depression   . Personal history of colonic polyps   . Heart murmur   . History of syphilis   . Gum disease     History   Social History  . Marital Status: Divorced    Spouse Name: N/A    Number of Children: 1  . Years of Education: N/A   Occupational History  . Not on file.   Social History Main Topics  . Smoking status: Former Smoker    Types: Cigarettes  . Smokeless tobacco: Never Used  . Alcohol Use: No  . Drug Use: No  . Sexual Activity: Not on file   Other Topics Concern  . 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 Therapeutic abortion  1970. 1974, 1978    Family History  Problem Relation Age of Onset  . Diabetes Mother   . Cancer Father     lung cancer  . Kidney disease Father   . Seizures Father   . Cancer Brother     colon  . Heart attack Brother   . Stroke Brother   . Colon cancer Neg Hx   . Stomach cancer Neg Hx     No Known Allergies  Current Outpatient Prescriptions on File Prior to Visit  Medication Sig Dispense Refill  . aspirin 81 MG chewable tablet Chew 81 mg by mouth daily.        . Calcium Carbonate-Vitamin D (CALTRATE 600+D) 600-400 MG-UNIT per tablet Take 1 tablet by mouth 2 (two) times daily.      Marland Kitchen diltiazem (CARDIZEM CD) 180 MG 24 hr capsule Take 180 mg by mouth daily.      . Ferrous  Gluconate (IRON) 240 (27 FE) MG TABS Take 1 tablet by mouth daily.        Marland Kitchen lisinopril-hydrochlorothiazide (PRINZIDE,ZESTORETIC) 20-12.5 MG per tablet Take 2 tablets by mouth daily.  60 tablet  3  . metoprolol tartrate (LOPRESSOR) 25 MG tablet Take 1 tablet (25 mg total) by mouth daily.  30 tablet  2   No current facility-administered medications on file prior to visit.    BP 120/70  Pulse 58  Temp(Src) 97.6 F (36.4 C) (Oral)  Resp 16  Ht 5\' 3"  (1.6 m)  Wt 175 lb 1.9 oz (79.434 kg)  BMI 31.03 kg/m2  SpO2 99%       Objective:   Physical Exam  Constitutional: She is oriented to person, place, and time. She appears well-developed and well-nourished.  HENT:  Head: Normocephalic and atraumatic.  Cardiovascular: Normal rate and regular rhythm.   No murmur heard. Pulmonary/Chest: Effort normal and breath sounds normal. No respiratory distress. She has no wheezes. She has no rales. She exhibits no tenderness.  Neurological: She is alert and oriented to person, place, and time.  Skin: Skin is warm and  dry.  Psychiatric: She has a normal mood and affect. Her behavior is normal. Judgment and thought content normal.          Assessment & Plan:

## 2012-10-15 ENCOUNTER — Encounter: Payer: Self-pay | Admitting: Family

## 2012-11-13 ENCOUNTER — Encounter: Payer: Self-pay | Admitting: Family

## 2012-11-13 MED ORDER — LISINOPRIL-HYDROCHLOROTHIAZIDE 20-12.5 MG PO TABS: 2.0000 | ORAL_TABLET | Freq: Every day | ORAL | Status: DC

## 2012-11-13 MED ORDER — METOPROLOL TARTRATE 25 MG PO TABS: 25.0000 mg | ORAL_TABLET | Freq: Every day | ORAL | Status: DC

## 2012-12-02 ENCOUNTER — Telehealth (INDEPENDENT_AMBULATORY_CARE_PROVIDER_SITE_OTHER): Payer: BC Managed Care – PPO | Admitting: Family

## 2012-12-02 ENCOUNTER — Encounter: Payer: Self-pay | Admitting: Family

## 2012-12-02 VITALS — BP 130/64 | HR 83 | Temp 97.7°F | Resp 16 | Ht 63.0 in | Wt 174.0 lb

## 2012-12-02 DIAGNOSIS — L02219 Cutaneous abscess of trunk, unspecified: Secondary | ICD-10-CM

## 2012-12-02 MED ORDER — DOXYCYCLINE HYCLATE 100 MG PO TABS: 100.0000 mg | ORAL_TABLET | Freq: Two times a day (BID) | ORAL | Status: DC

## 2012-12-02 NOTE — Progress Notes (Signed)
  Subjective:    Patient ID: Cassidy Harrell, female    DOB: 1950/09/16, 62 y.o.   MRN: [id number]  HPI  Cassidy Harrell is a 62 yr old female who presents today with chief complaint of boil between her breasts. Started 6 days ago.  Area is red and painful. Yesterday, area started draining.     Review of Systems     Objective:   Physical Exam  Constitutional: She appears well-developed and well-nourished. No distress.  HENT:  Head: Normocephalic and atraumatic.  Skin:     + tender erythema noted between breasts in triangular.  No significant fluctuance          Assessment & Plan:

## 2012-12-02 NOTE — Telephone Encounter (Signed)
Spoke with pt. She just scheduled an appt with [place] today at 11am.

## 2012-12-02 NOTE — Assessment & Plan Note (Signed)
Will rx with doxy. Plan close follow up.  No indication for I and D at this time. Advised pt re: need for warm compresses twice daily to facilitate drainage and to call us if area becomes more inflamed/swollen. Pt verbalizes understanding.

## 2012-12-02 NOTE — Patient Instructions (Addendum)
Please start doxycycline. Apply warm compress twice daily to help facilitate drainage. Call if symptoms worsen. Follow up on Wednesday.

## 2012-12-04 ENCOUNTER — Encounter: Payer: Self-pay | Admitting: Family

## 2012-12-04 ENCOUNTER — Ambulatory Visit (INDEPENDENT_AMBULATORY_CARE_PROVIDER_SITE_OTHER): Payer: BC Managed Care – PPO | Admitting: Family

## 2012-12-04 VITALS — BP 120/72 | HR 82 | Temp 98.0°F | Resp 16 | Ht 63.0 in | Wt 175.0 lb

## 2012-12-04 DIAGNOSIS — L02219 Cutaneous abscess of trunk, unspecified: Secondary | ICD-10-CM

## 2012-12-04 NOTE — Addendum Note (Signed)
Addended by: Mervin Kung A on: 12/04/2012 08:04 AM   Modules accepted: Orders

## 2012-12-04 NOTE — Progress Notes (Signed)
  Subjective:    Patient ID: Cassidy Harrell, female    DOB: 03/22/1950, 62 y.o.   MRN: [id number]  HPI  Cassidy Harrell is a 62 yr old female who presents today for a 2 day follow up of her anterior chest wall abscess/cellulitis. She is currently on doxycycline and notes some improvement.   The area continues to be tender with scant drainage.   Review of Systems     Objective:   Physical Exam  Constitutional: She appears well-developed and well-nourished. No distress.  Skin: Skin is warm and dry.  Abscess/cellulitis between the breasts is less erythematous. Some fluctuance is noted in center of the abscess.          Assessment & Plan:

## 2012-12-04 NOTE — Patient Instructions (Signed)
Continue antibiotics. Call if increased pain/swelling, or if you develop fever. Follow up on Monday of next week.

## 2012-12-04 NOTE — Assessment & Plan Note (Addendum)
Slight improvement noted.  Procedure including risks/benefits explained to patient.  Questions were answered. After informed consent was obtained and a time out completed, site was cleansed with betadine and then alcohol. 1% Lidocaine with epinephrine was injected into abscess for anesthesia. Incision and drainage was performed.  Bloody purulent drainage was expressed.  Drainage obtained for culture.  Pt tolerated procedure well.  Pt is instructed to continue antibiotics and to call if symptoms worsen or if symptoms do not continue to improve.

## 2012-12-06 ENCOUNTER — Telehealth: Payer: Self-pay | Admitting: Family

## 2012-12-06 MED ORDER — AMOXICILLIN-POT CLAVULANATE 875-125 MG PO TABS: 1.0000 | ORAL_TABLET | Freq: Two times a day (BID) | ORAL | Status: DC

## 2012-12-06 NOTE — Telephone Encounter (Signed)
Please call pt and let her know that based on culture results, I would like her to stop doxy and start augmentin instead for improved coverage.

## 2012-12-06 NOTE — Telephone Encounter (Signed)
Left detailed message on cell and to call if any questions. 

## 2012-12-07 LAB — WOUND CULTURE

## 2012-12-09 ENCOUNTER — Ambulatory Visit (INDEPENDENT_AMBULATORY_CARE_PROVIDER_SITE_OTHER): Payer: BC Managed Care – PPO | Admitting: Family

## 2012-12-09 ENCOUNTER — Encounter: Payer: Self-pay | Admitting: Family

## 2012-12-09 VITALS — BP 132/70 | HR 68 | Temp 97.5°F | Resp 16 | Ht 63.0 in | Wt 178.1 lb

## 2012-12-09 DIAGNOSIS — L02219 Cutaneous abscess of trunk, unspecified: Secondary | ICD-10-CM

## 2012-12-09 NOTE — Assessment & Plan Note (Signed)
Improving, though slowly.  I suspect based on exam that there are several small pockets within the abscess.  Will have pt continue augmentin, apply warm compresses bid to promote drainage.  Follow up in 1 week.

## 2012-12-09 NOTE — Patient Instructions (Signed)
Continue warm compresses twice daily to your chest wall. Continue augmentin. Call if symptoms worsen, or if symptoms do not continue to improve.  Follow up in 1 week.

## 2012-12-09 NOTE — Progress Notes (Signed)
Subjective:    Patient ID: Cassidy Harrell, female    DOB: Mar 07, 1950, 62 y.o.   MRN: [id number]  HPI  Cassidy Harrell is a 62 yr old female who presents today for follow up. She was seen on 10/8 with chest wall abscess.  An I and D was performed and she was started on doxycycline.  Wound culture grew Proteus Mirabilis.  Abx were changed to doxycycline.   The patient reports that she started augmentin on Saturday.  She reports that the area is much less painful.   Review of Systems See HPI  Past Medical History  Diagnosis Date  . Hypertension   . Phlebitis   . Irregular heart beat   . Arthritis   . Depression   . Personal history of colonic polyps   . Heart murmur   . History of syphilis   . Gum disease     History   Social History  . Marital Status: Divorced    Spouse Name: N/A    Number of Children: 1  . Years of Education: N/A   Occupational History  . Not on file.   Social History Main Topics  . Smoking status: Former Smoker    Types: Cigarettes  . Smokeless tobacco: Never Used  . Alcohol Use: No  . Drug Use: No  . Sexual Activity: Not on file   Other Topics Concern  . 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 Therapeutic abortion  1970. 1974, 1978    Family History  Problem Relation Age of Onset  . Diabetes Mother   . Cancer Father     lung cancer  . Kidney disease Father   . Seizures Father   . Cancer Brother     colon  . Heart attack Brother   . Stroke Brother   . Colon cancer Neg Hx   . Stomach cancer Neg Hx     No Known  Allergies  Current Outpatient Prescriptions on File Prior to Visit  Medication Sig Dispense Refill  . amoxicillin-clavulanate (AUGMENTIN) 875-125 MG per tablet Take 1 tablet by mouth 2 (two) times daily.  20 tablet  0  . aspirin 81 MG chewable tablet Chew 81 mg by mouth daily.        . Calcium Carbonate-Vitamin D (CALTRATE 600+D) 600-400 MG-UNIT per tablet Take 1 tablet by mouth 2 (two) times daily.      Marland Kitchen diltiazem (CARDIZEM CD) 180 MG 24 hr capsule Take 180 mg by mouth daily.      . Ferrous Gluconate (IRON) 240 (27 FE) MG TABS Take 1 tablet by mouth daily.        Marland Kitchen lisinopril-hydrochlorothiazide (PRINZIDE,ZESTORETIC) 20-12.5 MG per tablet Take 2 tablets by mouth daily.  60 tablet  3  . metoprolol tartrate (LOPRESSOR) 25 MG tablet Take 1 tablet (25 mg total) by mouth daily.  30 tablet  3   No current facility-administered medications on file prior to visit.    BP  132/70  Pulse 68  Temp(Src) 97.5 F (36.4 C) (Oral)  Resp 16  Ht 5\' 3"  (1.6 m)  Wt 178 lb 1.3 oz (80.777 kg)  BMI 31.55 kg/m2  SpO2 98%  \    Objective:   Physical Exam  Constitutional: She appears well-developed and well-nourished. No distress.  Skin:  Continued swelling noted between breasts.  Erythema is improved.  No significant fluctuance is noted.   Psychiatric: She has a normal mood and affect. Her behavior is normal. Thought content normal.          Assessment & Plan:

## 2012-12-16 ENCOUNTER — Ambulatory Visit: Payer: BC Managed Care – PPO | Admitting: Family

## 2012-12-31 ENCOUNTER — Encounter: Payer: Self-pay | Admitting: Family

## 2012-12-31 ENCOUNTER — Ambulatory Visit (INDEPENDENT_AMBULATORY_CARE_PROVIDER_SITE_OTHER): Payer: BC Managed Care – PPO | Admitting: Family

## 2012-12-31 VITALS — BP 120/70 | HR 64 | Temp 97.9°F | Resp 16 | Ht 63.0 in | Wt 176.0 lb

## 2012-12-31 DIAGNOSIS — E119 Type 2 diabetes mellitus without complications: Secondary | ICD-10-CM

## 2012-12-31 DIAGNOSIS — R7309 Other abnormal glucose: Secondary | ICD-10-CM

## 2012-12-31 DIAGNOSIS — L02219 Cutaneous abscess of trunk, unspecified: Secondary | ICD-10-CM

## 2012-12-31 DIAGNOSIS — R7303 Prediabetes: Secondary | ICD-10-CM

## 2012-12-31 DIAGNOSIS — I10 Essential (primary) hypertension: Secondary | ICD-10-CM

## 2012-12-31 LAB — BASIC METABOLIC PANEL
Chloride: 102 mEq/L (ref 96–112)
Potassium: 3.5 mEq/L (ref 3.5–5.3)
Sodium: 140 mEq/L (ref 135–145)

## 2012-12-31 LAB — HEMOGLOBIN A1C
Hgb A1c MFr Bld: 6.4 % — ABNORMAL HIGH (ref <5.7)
Mean Plasma Glucose: 137 mg/dL — ABNORMAL HIGH (ref <117)

## 2012-12-31 MED ORDER — DILTIAZEM HCL ER COATED BEADS 180 MG PO CP24: 180.0000 mg | ORAL_CAPSULE | Freq: Every day | ORAL | Status: DC

## 2012-12-31 NOTE — Assessment & Plan Note (Signed)
BP well controlled on current meds. Continue same, obtain bmet.

## 2012-12-31 NOTE — Patient Instructions (Signed)
Please complete lab work prior to leaving. Call if increased swelling, drainage, redness from chest wall abscess. Follow up in 3 months.

## 2012-12-31 NOTE — Progress Notes (Signed)
Subjective:    Patient ID: Cassidy Harrell, female    DOB: 1950-08-06, 62 y.o.   MRN: [id number]  HPI  Cassidy Harrell is a 62 yr old female who presents today for followup.  1) Cellulitis/abscess of breast- wound culture grew Proteus Mirabilis. She was treated with augmentin.   2) HTN- currently maintained on lisinopril-hctz and diltiazem. Reports that she is tolerating bp meds without difficulty.   Denies CP or SOB.  Reports mild occasional LE edema.  3) Hyperglycemia- last A1C 07/10/12 was 6. 4.   Review of Systems    see HPI  Past Medical History  Diagnosis Date  . Hypertension   . Phlebitis   . Irregular heart beat   . Arthritis   . Depression   . Personal history of colonic polyps   . Heart murmur   . History of syphilis   . Gum disease     History   Social History  . Marital Status: Divorced    Spouse Name: N/A    Number of Children: 1  . Years of Education: N/A   Occupational History  . Not on file.   Social History Main Topics  . Smoking status: Former Smoker    Types: Cigarettes  . Smokeless tobacco: Never Used  . Alcohol Use: No  . Drug Use: No  . Sexual Activity: Not on file   Other Topics Concern  . 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 Therapeutic abortion  1970. 1974, 1978    Family History  Problem Relation Age of Onset  . Diabetes Mother   . Cancer Father     lung cancer  . Kidney disease Father   . Seizures Father   . Cancer Brother     colon  . Heart attack Brother   . Stroke Brother   . Colon cancer Neg Hx   . Stomach  cancer Neg Hx     No Known Allergies  Current Outpatient Prescriptions on File Prior to Visit  Medication Sig Dispense Refill  . aspirin 81 MG chewable tablet Chew 81 mg by mouth daily.        . Calcium Carbonate-Vitamin D (CALTRATE 600+D) 600-400 MG-UNIT per tablet Take 1 tablet by mouth 2 (two) times daily.      Marland Kitchen diltiazem (CARDIZEM CD) 180 MG 24 hr capsule Take 180 mg by mouth daily.      . Ferrous Gluconate (IRON) 240 (27 FE) MG TABS Take 1 tablet by mouth daily.        Marland Kitchen lisinopril-hydrochlorothiazide (PRINZIDE,ZESTORETIC) 20-12.5 MG per tablet Take 2 tablets by mouth daily.  60 tablet  3  . metoprolol tartrate (LOPRESSOR) 25 MG tablet Take 1 tablet (25 mg total) by mouth daily.  30 tablet  3   No current facility-administered medications on file prior to visit.    BP 120/70  Pulse 64  Temp(Src) 97.9 F (36.6 C) (Oral)  Resp 16  Ht 5\' 3"  (1.6 m)  Wt 176 lb (79.833 kg)  BMI 31.18 kg/m2  SpO2 98%    Objective:   Physical Exam  Constitutional: She is oriented to person, place, and time. She appears well-developed and well-nourished.  Cardiovascular: Normal rate and regular rhythm.   No murmur heard. Pulmonary/Chest: Effort normal and breath sounds normal. No respiratory distress. She has no wheezes. She has no rales. She exhibits no tenderness.  Musculoskeletal:  1-2 + bilateral LE edema  Neurological: She is alert and oriented to person, place, and time.  Skin:  Firm nodular approximately 1 cm wide induration noted between breasts.  Non-tender, no fluctuance, no erythema, not drainage.    Psychiatric: She has a normal mood and affect. Her behavior is normal. Judgment and thought content normal.          Assessment & Plan:

## 2012-12-31 NOTE — Assessment & Plan Note (Signed)
Obtain A1C. Obtain bmet

## 2012-12-31 NOTE — Assessment & Plan Note (Signed)
Improved.  I think that the remaining area is more of a scar tissue at this point so will hold off on further abx.  I have asked the patient to keep a close eye on the area and to call if increased swelling, redness pain or drainage and she verbalizes understanding.

## 2013-01-01 ENCOUNTER — Encounter: Payer: Self-pay | Admitting: Family

## 2013-01-06 ENCOUNTER — Ambulatory Visit: Payer: BC Managed Care – PPO | Admitting: Family

## 2013-03-10 ENCOUNTER — Other Ambulatory Visit: Payer: Self-pay

## 2013-03-10 DIAGNOSIS — Z1231 Encounter for screening mammogram for malignant neoplasm of breast: Secondary | ICD-10-CM

## 2013-03-24 ENCOUNTER — Encounter: Payer: Self-pay | Admitting: Family

## 2013-03-24 MED ORDER — METOPROLOL TARTRATE 25 MG PO TABS: 25.0000 mg | ORAL_TABLET | Freq: Every day | ORAL | Status: DC

## 2013-03-24 MED ORDER — LISINOPRIL-HYDROCHLOROTHIAZIDE 20-12.5 MG PO TABS
2.0000 | ORAL_TABLET | Freq: Every day | ORAL | Status: DC
Start: 2013-03-24 — End: 2013-07-25

## 2013-03-27 ENCOUNTER — Ambulatory Visit
Admission: RE | Admit: 2013-03-27 | Discharge: 2013-03-27 | Disposition: A | Discharge status: Home / Self Care | Payer: BC Managed Care – PPO | Source: Ambulatory Visit

## 2013-03-27 DIAGNOSIS — Z1231 Encounter for screening mammogram for malignant neoplasm of breast: Secondary | ICD-10-CM

## 2013-04-01 ENCOUNTER — Encounter: Payer: Self-pay | Admitting: Family

## 2013-04-01 ENCOUNTER — Ambulatory Visit (INDEPENDENT_AMBULATORY_CARE_PROVIDER_SITE_OTHER): Payer: BC Managed Care – PPO | Admitting: Family

## 2013-04-01 ENCOUNTER — Other Ambulatory Visit (HOSPITAL_COMMUNITY)
Admission: RE | Admit: 2013-04-01 | Discharge: 2013-04-01 | Disposition: A | Discharge status: Home / Self Care | Payer: BC Managed Care – PPO | Source: Ambulatory Visit | Attending: Family | Admitting: Family

## 2013-04-01 ENCOUNTER — Ambulatory Visit: Payer: BC Managed Care – PPO | Admitting: Family Medicine

## 2013-04-01 VITALS — BP 124/70 | HR 67 | Temp 97.7°F | Resp 16 | Ht 63.0 in | Wt 179.1 lb

## 2013-04-01 DIAGNOSIS — Z01419 Encounter for gynecological examination (general) (routine) without abnormal findings: Secondary | ICD-10-CM | POA: Insufficient documentation

## 2013-04-01 DIAGNOSIS — Z1151 Encounter for screening for human papillomavirus (HPV): Secondary | ICD-10-CM | POA: Insufficient documentation

## 2013-04-01 DIAGNOSIS — Z23 Encounter for immunization: Secondary | ICD-10-CM

## 2013-04-01 DIAGNOSIS — R7303 Prediabetes: Secondary | ICD-10-CM

## 2013-04-01 DIAGNOSIS — I10 Essential (primary) hypertension: Secondary | ICD-10-CM

## 2013-04-01 DIAGNOSIS — R7309 Other abnormal glucose: Secondary | ICD-10-CM

## 2013-04-01 NOTE — Addendum Note (Signed)
Addended by: Kelle Darting A on: 04/01/2013 08:29 AM   Modules accepted: Orders

## 2013-04-01 NOTE — Patient Instructions (Signed)
Please follow up in 3 months. Try to walk every day. We will let you know the results of your pap smear.

## 2013-04-01 NOTE — Assessment & Plan Note (Signed)
Pap performed today. 

## 2013-04-01 NOTE — Progress Notes (Signed)
Subjective:    Patient ID: Cassidy Harrell, female    DOB: 1950-05-31, 63 y.o.   MRN: [id number]  HPI  Cassidy Harrell is a 63 yr old female who presents today for follow up and GYN exam.  HTN- She is currently maintained on lisinopril-hctz, diltiazem and metoprolol. She denies CP, SOB.  Notes mild LE edema.   BP Readings from Last 3 Encounters:  04/01/13 124/70  12/31/12 120/70  12/09/12 132/70   Hyperglycemia- last A1C in early November was 6.4.   Wt Readings from Last 3 Encounters:  04/01/13 179 lb 1.9 oz (81.248 kg)  12/31/12 176 lb (79.833 kg)  12/09/12 178 lb 1.3 oz (80.777 kg)   GYN-  Last pap was 3  Yrs ago.  Reports paps always normal.    Review of Systems See HPI  Past Medical History  Diagnosis Date  . Hypertension   . Phlebitis   . Irregular heart beat   . Arthritis   . Depression   . Personal history of colonic polyps   . Heart murmur   . History of syphilis   . Gum disease     History   Social History  . Marital Status: Divorced    Spouse Name: N/A    Number of Children: 1  . Years of Education: N/A   Occupational History  . Not on file.   Social History Main Topics  . Smoking status: Former Smoker    Types: Cigarettes  . Smokeless tobacco: Never Used  . Alcohol Use: No  . Drug Use: No  . Sexual Activity: Not on file   Other Topics Concern  . 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 Therapeutic abortion  [place]    Family History  Problem Relation Age of Onset  . Diabetes Mother   . Cancer Father     lung cancer  .  Kidney disease Father   . Seizures Father   . Cancer Brother     colon  . Heart attack Brother   . Stroke Brother   . Colon cancer Neg Hx   . Stomach cancer Neg Hx     No Known Allergies  Current Outpatient Prescriptions on File Prior to Visit  Medication Sig Dispense Refill  . aspirin 81 MG chewable tablet Chew 81 mg by mouth daily.        . Calcium Carbonate-Vitamin D (CALTRATE 600+D) 600-400 MG-UNIT per tablet Take 1 tablet by mouth 2 (two) times daily.      Marland Kitchen diltiazem (CARDIZEM CD) 180 MG 24 hr capsule Take 1 capsule (180 mg total) by mouth daily.  30 capsule  5  . Ferrous Gluconate (IRON) 240 (27 FE) MG TABS Take 1 tablet by mouth daily.        Marland Kitchen lisinopril-hydrochlorothiazide (PRINZIDE,ZESTORETIC) 20-12.5 MG per tablet Take 2 tablets by mouth daily.  60 tablet  3  . metoprolol tartrate (LOPRESSOR) 25 MG tablet Take 1 tablet (25  mg total) by mouth daily.  30 tablet  3   No current facility-administered medications on file prior to visit.    BP 124/70  Pulse 67  Temp(Src) 97.7 F (36.5 C) (Oral)  Resp 16  Ht 5\' 3"  (1.6 m)  Wt 179 lb 1.9 oz (81.248 kg)  BMI 31.74 kg/m2  SpO2 99%       Objective:   Physical Exam  Constitutional: She is oriented to person, place, and time. She appears well-developed and well-nourished. No distress.  Cardiovascular: Normal rate and regular rhythm.   No murmur heard. Pulmonary/Chest: Effort normal and breath sounds normal. No respiratory distress. She has no wheezes. She has no rales. She exhibits no tenderness.  Genitourinary:  Breasts: Examined lying and sitting.  Right: Without masses, retractions, discharge or axillary adenopathy.  Left: Without masses, retractions, discharge or axillary adenopathy.  Inguinal/mons: Normal without inguinal adenopathy  External genitalia: Normal  BUS/Urethra/Skene's glands: Normal  Bladder: Normal  Vagina: Normal  Cervix: Normal  Uterus: normal in size, shape and contour. Midline and mobile    Adnexa/parametria:  Rt: Without masses or tenderness.  Lt: Without masses or tenderness.  Anus and perineum: Normal    Neurological: She is alert and oriented to person, place, and time.  Psychiatric: She has a normal mood and affect. Her behavior is normal. Judgment and thought content normal.          Assessment & Plan:

## 2013-04-01 NOTE — Assessment & Plan Note (Signed)
We discussed importance of healthy diet, exercise.

## 2013-04-01 NOTE — Assessment & Plan Note (Signed)
Blood pressure stable on current meds.  Will continue same.

## 2013-04-01 NOTE — Progress Notes (Signed)
Pre visit review using our clinic review tool, if applicable. No additional management support is needed unless otherwise documented below in the visit note. 

## 2013-04-03 ENCOUNTER — Encounter: Payer: Self-pay | Admitting: Family

## 2013-07-25 ENCOUNTER — Encounter: Payer: Self-pay | Admitting: Family

## 2013-07-25 ENCOUNTER — Other Ambulatory Visit: Payer: Self-pay | Admitting: Family

## 2013-07-25 MED ORDER — LISINOPRIL-HYDROCHLOROTHIAZIDE 20-12.5 MG PO TABS: 2.0000 | ORAL_TABLET | Freq: Every day | ORAL | Status: DC

## 2013-07-25 MED ORDER — METOPROLOL TARTRATE 25 MG PO TABS: 25.0000 mg | ORAL_TABLET | Freq: Every day | ORAL | Status: DC

## 2013-07-25 MED ORDER — DILTIAZEM HCL ER COATED BEADS 180 MG PO CP24: 180.0000 mg | ORAL_CAPSULE | Freq: Every day | ORAL | Status: DC

## 2013-08-25 ENCOUNTER — Encounter: Payer: Self-pay | Admitting: Family

## 2013-08-25 ENCOUNTER — Other Ambulatory Visit: Payer: Self-pay | Admitting: Family

## 2013-08-25 ENCOUNTER — Ambulatory Visit (INDEPENDENT_AMBULATORY_CARE_PROVIDER_SITE_OTHER): Payer: BC Managed Care – PPO | Admitting: Family

## 2013-08-25 VITALS — BP 144/84 | HR 64 | Temp 97.9°F | Resp 16 | Ht 63.0 in | Wt 179.0 lb

## 2013-08-25 DIAGNOSIS — R7303 Prediabetes: Secondary | ICD-10-CM

## 2013-08-25 DIAGNOSIS — R739 Hyperglycemia, unspecified: Secondary | ICD-10-CM

## 2013-08-25 DIAGNOSIS — D509 Iron deficiency anemia, unspecified: Secondary | ICD-10-CM

## 2013-08-25 DIAGNOSIS — I10 Essential (primary) hypertension: Secondary | ICD-10-CM

## 2013-08-25 DIAGNOSIS — E119 Type 2 diabetes mellitus without complications: Secondary | ICD-10-CM | POA: Insufficient documentation

## 2013-08-25 DIAGNOSIS — R7309 Other abnormal glucose: Secondary | ICD-10-CM

## 2013-08-25 LAB — CBC WITH DIFFERENTIAL/PLATELET
BASOS ABS: 0 10*3/uL (ref 0.0–0.1)
Basophils Relative: 0 % (ref 0–1)
EOS PCT: 2 % (ref 0–5)
Eosinophils Absolute: 0.1 10*3/uL (ref 0.0–0.7)
HCT: 36 % (ref 36.0–46.0)
Hemoglobin: 12.4 g/dL (ref 12.0–15.0)
Lymphocytes Relative: 43 % (ref 12–46)
Lymphs Abs: 1.9 10*3/uL (ref 0.7–4.0)
MCH: 27.7 pg (ref 26.0–34.0)
MCHC: 34.4 g/dL (ref 30.0–36.0)
MCV: 80.5 fL (ref 78.0–100.0)
MONO ABS: 0.4 10*3/uL (ref 0.1–1.0)
Monocytes Relative: 8 % (ref 3–12)
Neutro Abs: 2.1 10*3/uL (ref 1.7–7.7)
Neutrophils Relative %: 47 % (ref 43–77)
PLATELETS: 229 10*3/uL (ref 150–400)
RBC: 4.47 MIL/uL (ref 3.87–5.11)
RDW: 14.2 % (ref 11.5–15.5)
WBC: 4.5 10*3/uL (ref 4.0–10.5)

## 2013-08-25 LAB — HEMOGLOBIN A1C
Hgb A1c MFr Bld: 6.6 % — ABNORMAL HIGH (ref <5.7)
Mean Plasma Glucose: 143 mg/dL — ABNORMAL HIGH (ref <117)

## 2013-08-25 LAB — BASIC METABOLIC PANEL
BUN: 13 mg/dL (ref 6–23)
CHLORIDE: 102 meq/L (ref 96–112)
CO2: 29 meq/L (ref 19–32)
Calcium: 9.3 mg/dL (ref 8.4–10.5)
Creat: 0.7 mg/dL (ref 0.50–1.10)
GLUCOSE: 103 mg/dL — AB (ref 70–99)
Potassium: 3.9 mEq/L (ref 3.5–5.3)
SODIUM: 141 meq/L (ref 135–145)

## 2013-08-25 LAB — IRON: Iron: 85 ug/dL (ref 42–145)

## 2013-08-25 NOTE — Assessment & Plan Note (Addendum)
BP stable- 128/68 when repeated during exam. Encouraged more exercise. Obtain BMET. Encouraged eye exam. Continue current meds.

## 2013-08-25 NOTE — Assessment & Plan Note (Signed)
Obtain A1C. 

## 2013-08-25 NOTE — Assessment & Plan Note (Signed)
Avoids simple carbs. Emphasized importance of exercise and diet. Obtain A1c today.

## 2013-08-25 NOTE — Patient Instructions (Addendum)
Complete lab work prior to leaving. Follow up in 3 months.

## 2013-08-25 NOTE — Assessment & Plan Note (Signed)
Check serum iron and CBC today.

## 2013-08-25 NOTE — Progress Notes (Signed)
Pre visit review using our clinic review tool, if applicable. No additional management support is needed unless otherwise documented below in the visit note. 

## 2013-08-25 NOTE — Progress Notes (Signed)
Subjective:    Patient ID: Cassidy Harrell, female    DOB: 1950-08-24, 63 y.o.   MRN: [id number]  HPI Ms Garbett is a 63 yo female here today for fu of HTN and pre-diabetes.  HTN- She is currently maintained on lisinopril-hctz, diltiazem and metoprolol. Compliant She denies CP, SOB. Notes mild LE edema-worsens during summer months. Does not check BP at home. BP Readings from Last 3 Encounters:  08/25/13 144/84  04/01/13 124/70  12/31/12 120/70  Manual BP recheck 129/68  Hyperglycemia -  Lab Results  Component Value Date   HGBA1C 6.4* 12/31/2012   Review of Systems  Constitutional: Positive for fatigue (Occasional). Negative for fever and chills.  HENT: Negative for nosebleeds.   Cardiovascular: Negative for palpitations.   Exercises very little. Walks one day per week  For 30 minutes.  Avoids salt. Avoids bread and simple carbs. Avoids caffeine. Last eye check was 4 years ago.  Past Medical History  Diagnosis Date  . Hypertension   . Phlebitis   . Irregular heart beat   . Arthritis   . Depression   . Personal history of colonic polyps   . Heart murmur   . History of syphilis   . Gum disease     History   Social History  . Marital Status: Divorced    Spouse Name: N/A    Number of Children: 1  . Years of Education: N/A   Occupational History  . Not on file.   Social History Main Topics  . Smoking status: Former Smoker    Types: Cigarettes  . Smokeless tobacco: Never Used  . Alcohol Use: No  . Drug Use: No  . Sexual Activity: Not on file   Other Topics Concern  . 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 Therapeutic abortion  [place]    Family History  Problem Relation Age of Onset  . Diabetes Mother   . Cancer Father     lung cancer  . Kidney disease Father   . Seizures Father   . Cancer Brother     colon  . Heart attack Brother   . Stroke Brother   . Colon cancer Neg Hx   . Stomach cancer Neg Hx     No Known Allergies  Current Outpatient Prescriptions on File Prior to Visit  Medication Sig Dispense Refill  . aspirin 81 MG chewable tablet Chew 81 mg by mouth daily.        . Calcium Carbonate-Vitamin D (CALTRATE 600+D) 600-400 MG-UNIT per tablet Take 1 tablet by mouth 2 (two) times daily.      Marland Kitchen diltiazem (CARDIZEM CD) 180 MG 24 hr capsule Take 1 capsule (180 mg total) by mouth daily.  30 capsule  3  . Ferrous Gluconate (IRON) 240 (27 FE) MG TABS Take 1 tablet by mouth daily.        Marland Kitchen lisinopril-hydrochlorothiazide (PRINZIDE,ZESTORETIC) 20-12.5 MG per tablet Take 2 tablets by mouth daily.  60 tablet  3  . metoprolol tartrate (LOPRESSOR) 25 MG tablet Take 1 tablet (25 mg total) by mouth daily.  30 tablet  3   No current facility-administered medications on file prior to visit.    BP 144/84  Pulse 64  Temp(Src) 97.9 F (36.6 C) (Oral)  Resp 16  Ht 5\' 3"  (1.6 m)  Wt 179 lb (81.194 kg)  BMI 31.72 kg/m2  SpO2 99%    Objective:   Physical Exam  Constitutional: She is oriented to person, place, and time. She appears well-developed and well-nourished. No distress.  HENT:  Head: Normocephalic and atraumatic.  Mouth/Throat: No oropharyngeal exudate.  Eyes: Pupils are equal, round, and reactive to light. No scleral icterus.  Neck: Neck supple. No JVD present.  Cardiovascular: Normal rate, regular rhythm and normal heart sounds.   Distal pulses difficult to assess due to pedal edema. Faint.  Pulmonary/Chest: Effort normal and breath sounds normal. No respiratory distress. She has no wheezes. She has no  rales.  Musculoskeletal: She exhibits edema.  2+ pedal edema bilaterally.  Lymphadenopathy:    She has no cervical adenopathy.  Neurological: She is alert and oriented to person, place, and time.  Skin: Skin is warm and dry. She is not diaphoretic.  Psychiatric: She has a normal mood and affect. Her behavior is normal.          Assessment & Plan:  Patient seen by [facility] NP-student.  I have personally seen and examined patient and agree with Ms. Whitmire's assessment and plan.

## 2013-08-26 ENCOUNTER — Telehealth: Payer: Self-pay | Admitting: Family

## 2013-08-26 ENCOUNTER — Encounter: Payer: Self-pay | Admitting: Family

## 2013-08-26 NOTE — Telephone Encounter (Signed)
Relevant patient education assigned to patient using Emmi. ° °

## 2013-11-24 ENCOUNTER — Ambulatory Visit (INDEPENDENT_AMBULATORY_CARE_PROVIDER_SITE_OTHER): Payer: BC Managed Care – PPO | Admitting: Family

## 2013-11-24 ENCOUNTER — Ambulatory Visit (HOSPITAL_BASED_OUTPATIENT_CLINIC_OR_DEPARTMENT_OTHER)
Admission: RE | Admit: 2013-11-24 | Discharge: 2013-11-24 | Disposition: A | Discharge status: Home / Self Care | Payer: BC Managed Care – PPO | Source: Ambulatory Visit | Attending: Family | Admitting: Family

## 2013-11-24 ENCOUNTER — Encounter: Payer: Self-pay | Admitting: Family

## 2013-11-24 VITALS — BP 119/93 | HR 69 | Temp 97.9°F | Resp 18 | Ht 63.0 in | Wt 181.8 lb

## 2013-11-24 DIAGNOSIS — E049 Nontoxic goiter, unspecified: Secondary | ICD-10-CM | POA: Diagnosis present

## 2013-11-24 DIAGNOSIS — Z23 Encounter for immunization: Secondary | ICD-10-CM

## 2013-11-24 DIAGNOSIS — D509 Iron deficiency anemia, unspecified: Secondary | ICD-10-CM

## 2013-11-24 DIAGNOSIS — I10 Essential (primary) hypertension: Secondary | ICD-10-CM

## 2013-11-24 DIAGNOSIS — E010 Iodine-deficiency related diffuse (endemic) goiter: Secondary | ICD-10-CM

## 2013-11-24 DIAGNOSIS — E042 Nontoxic multinodular goiter: Secondary | ICD-10-CM | POA: Diagnosis not present

## 2013-11-24 DIAGNOSIS — E119 Type 2 diabetes mellitus without complications: Secondary | ICD-10-CM

## 2013-11-24 DIAGNOSIS — M79609 Pain in unspecified limb: Secondary | ICD-10-CM

## 2013-11-24 DIAGNOSIS — M79673 Pain in unspecified foot: Secondary | ICD-10-CM | POA: Insufficient documentation

## 2013-11-24 LAB — BASIC METABOLIC PANEL
BUN: 10 mg/dL (ref 6–23)
CHLORIDE: 100 meq/L (ref 96–112)
CO2: 26 mEq/L (ref 19–32)
Calcium: 9.2 mg/dL (ref 8.4–10.5)
Creatinine, Ser: 0.8 mg/dL (ref 0.4–1.2)
GFR: 100.45 mL/min (ref >60.00)
GLUCOSE: 103 mg/dL — AB (ref 70–99)
POTASSIUM: 3 meq/L — AB (ref 3.5–5.1)
Sodium: 136 mEq/L (ref 135–145)

## 2013-11-24 LAB — MICROALBUMIN / CREATININE URINE RATIO
CREATININE, U: 211.4 mg/dL
Microalb Creat Ratio: 0.4 mg/g (ref 0.0–30.0)
Microalb, Ur: 0.8 mg/dL (ref 0.0–1.9)

## 2013-11-24 LAB — HEMOGLOBIN A1C: Hgb A1c MFr Bld: 6.5 % (ref 4.6–6.5)

## 2013-11-24 NOTE — Progress Notes (Signed)
Subjective:    Patient ID: Cassidy Harrell, female    DOB: 31-Dec-1950, 63 y.o.   MRN: [id number]  HPI  Cassidy Harrell is a 63 yr old female who presents today for follow up.  1) HTN- current meds include diltiazem, metoprolol and lisinopril-hctz.  Densies  BP, sob. Some chronic foot swelling, unchanged.   BP Readings from Last 3 Encounters:  11/24/13 119/93  08/25/13 144/84  04/01/13 124/70   2) Anemia- maintained on iron tabs. Denies constipation.   Lab Results  Component Value Date   WBC 4.5 08/25/2013   HGB 12.4 08/25/2013   HCT 36.0 08/25/2013   MCV 80.5 08/25/2013   PLT 229 08/25/2013    3) Foot pain-  Reports intermittent bilateral foot pain.  Pain is sharp at times and throbbing. Pain can occur anywhere in the foot.   4) DM2-  Due for eye exam. Will go in December.  Has gained some weight.   Lab Results  Component Value Date   HGBA1C 6.6* 08/25/2013   HGBA1C 6.4* 12/31/2012   HGBA1C 6.4* 07/10/2012   Lab Results  Component Value Date   LDLCALC 9 09/16/2012   CREATININE 0.70 08/25/2013   Pt notes some fullness in her neck and with swallowing.   Review of Systems See HPI  Past Medical History  Diagnosis Date  . Hypertension   . Phlebitis   . Irregular heart beat   . Arthritis   . Depression   . Personal history of colonic polyps   . Heart murmur   . History of syphilis   . Gum disease     History   Social History  . Marital Status: Divorced    Spouse Name: N/A    Number of Children: 1  . Years of Education: N/A   Occupational History  . Not on file.   Social History Main Topics  . Smoking status: Former Smoker    Types: Cigarettes  . Smokeless tobacco: Never Used  . Alcohol Use: No  . Drug Use: No  . Sexual Activity: Not on file   Other Topics Concern  . 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 Therapeutic abortion  [place]    Family History  Problem Relation Age of Onset  . Diabetes Mother   . Cancer Father     lung cancer  . Kidney disease Father   . Seizures Father   . Cancer Brother     colon  . Heart attack Brother   . Stroke Brother   . Colon cancer Neg Hx   . Stomach cancer Neg Hx     No Known Allergies  Current Outpatient Prescriptions on File Prior to Visit  Medication Sig Dispense Refill  . aspirin 81 MG chewable tablet Chew 81 mg by mouth daily.        . Calcium Carbonate-Vitamin D (CALTRATE 600+D) 600-400 MG-UNIT per tablet Take 1 tablet by mouth 2 (two) times daily.      Marland Kitchen  diltiazem (CARDIZEM CD) 180 MG 24 hr capsule Take 1 capsule (180 mg total) by mouth daily.  30 capsule  3  . Ferrous Gluconate (IRON) 240 (27 FE) MG TABS Take 1 tablet by mouth daily.        Marland Kitchen lisinopril-hydrochlorothiazide (PRINZIDE,ZESTORETIC) 20-12.5 MG per tablet Take 2 tablets by mouth daily.  60 tablet  3  . metoprolol tartrate (LOPRESSOR) 25 MG tablet Take 1 tablet (25 mg total) by mouth daily.  30 tablet  3   No current facility-administered medications on file prior to visit.    BP 119/93  Pulse 69  Temp(Src) 97.9 F (36.6 C) (Oral)  Resp 18  Ht 5\' 3"  (1.6 m)  Wt 181 lb 12.8 oz (82.464 kg)  BMI 32.21 kg/m2  SpO2 100%       Objective:   Physical Exam  Constitutional: She is oriented to person, place, and time. She appears well-developed and well-nourished. No distress.  HENT:  Head: Normocephalic and atraumatic.  Neck: Thyromegaly present.  Cardiovascular: Normal rate and regular rhythm.   No murmur heard. Pulmonary/Chest: Effort normal and breath sounds normal. No respiratory distress. She  has no wheezes. She has no rales. She exhibits no tenderness.  Musculoskeletal:  2+ bilateral LE edema  Neurological: She is alert and oriented to person, place, and time.  Psychiatric: She has a normal mood and affect. Her behavior is normal. Judgment and thought content normal.          Assessment & Plan:

## 2013-11-24 NOTE — Progress Notes (Signed)
Pre visit review using our clinic review tool, if applicable. No additional management support is needed unless otherwise documented below in the visit note. 

## 2013-11-24 NOTE — Addendum Note (Signed)
Addended by: Kelle Darting A on: 11/24/2013 09:15 AM   Modules accepted: Orders

## 2013-11-24 NOTE — Assessment & Plan Note (Signed)
Stable continue iron tab.

## 2013-11-24 NOTE — Assessment & Plan Note (Signed)
Suspect mild OA, advised sparing use of ibuprofen as needed, good supportive shoes

## 2013-11-24 NOTE — Assessment & Plan Note (Addendum)
Discussed importance of diet/exercise, weight loss. Flu shot/pneumovax today. Obtain urine microalbumin, a1C and bmet

## 2013-11-24 NOTE — Assessment & Plan Note (Signed)
Will obtain TSH and [place] of thyroid.

## 2013-11-24 NOTE — Patient Instructions (Signed)
Please continue to work on diabetic diet, exercise, weight loss. Complete lab work prior to leaving. Follow up in 3 months.

## 2013-11-24 NOTE — Assessment & Plan Note (Signed)
Fair control, continue current meds.

## 2013-11-25 ENCOUNTER — Telehealth: Payer: Self-pay | Admitting: Family

## 2013-11-25 DIAGNOSIS — E876 Hypokalemia: Secondary | ICD-10-CM

## 2013-11-25 MED ORDER — POTASSIUM CHLORIDE CRYS ER 20 MEQ PO TBCR: 20.0000 meq | EXTENDED_RELEASE_TABLET | Freq: Every day | ORAL | Status: DC

## 2013-11-25 NOTE — Telephone Encounter (Signed)
Potassium is low. Start Kdur- Take 2 tabs by mouth today, then one tablet daily. Repeat bmet in 1 week, dx hypokalemia.

## 2013-11-26 NOTE — Telephone Encounter (Signed)
Left detailed message on cell# and entered future order. Advised pt to call and schedule lab appt in 1 week.

## 2013-12-01 ENCOUNTER — Other Ambulatory Visit: Payer: Self-pay | Admitting: Family

## 2013-12-01 ENCOUNTER — Encounter: Payer: Self-pay | Admitting: Family

## 2013-12-01 MED ORDER — LISINOPRIL-HYDROCHLOROTHIAZIDE 20-12.5 MG PO TABS: 2.0000 | ORAL_TABLET | Freq: Every day | ORAL | Status: DC

## 2013-12-01 NOTE — Telephone Encounter (Signed)
Rx request to pharmacy/SLS  

## 2014-01-19 ENCOUNTER — Ambulatory Visit (INDEPENDENT_AMBULATORY_CARE_PROVIDER_SITE_OTHER): Payer: BC Managed Care – PPO | Admitting: Family

## 2014-01-19 DIAGNOSIS — E049 Nontoxic goiter, unspecified: Secondary | ICD-10-CM

## 2014-01-19 DIAGNOSIS — E876 Hypokalemia: Secondary | ICD-10-CM

## 2014-01-19 NOTE — Progress Notes (Signed)
Pt came to the office for follow up of potassium. Per 11/25/13 phone note, pt was supposed to have repeated bmet 1 week after starting potassium. Pt thought she was supposed to return after completing potassium Rx and has been off medication since end of October. Pt has no other concerns today. Per verbal from Provider, ok to complete future labs for bmet and tsh today. Pt is due for routine follow up on or after 02/23/14.

## 2014-01-20 ENCOUNTER — Telehealth: Payer: Self-pay | Admitting: Family

## 2014-01-20 DIAGNOSIS — E876 Hypokalemia: Secondary | ICD-10-CM

## 2014-01-20 LAB — BASIC METABOLIC PANEL
BUN: 12 mg/dL (ref 6–23)
CHLORIDE: 101 meq/L (ref 96–112)
CO2: 28 mEq/L (ref 19–32)
Calcium: 9 mg/dL (ref 8.4–10.5)
Creatinine, Ser: 0.8 mg/dL (ref 0.4–1.2)
GFR: 88.09 mL/min (ref >60.00)
Glucose, Bld: 93 mg/dL (ref 70–99)
POTASSIUM: 3.3 meq/L — AB (ref 3.5–5.1)
Sodium: 141 mEq/L (ref 135–145)

## 2014-01-20 LAB — TSH: TSH: 3.68 u[IU]/mL (ref 0.35–4.50)

## 2014-01-20 NOTE — Telephone Encounter (Signed)
Potassium is low. Needs to start potassium supplement. Repeat bmet in 1 week dx hypokalemia.

## 2014-01-21 MED ORDER — POTASSIUM CHLORIDE CRYS ER 20 MEQ PO TBCR: 20.0000 meq | EXTENDED_RELEASE_TABLET | Freq: Every day | ORAL | Status: DC

## 2014-01-21 NOTE — Telephone Encounter (Signed)
Notified pt and she voices understanding. Refill sent to pharmacy. Lab appt scheduled for 01/28/14 at 9am.  Future order entered.

## 2014-01-28 ENCOUNTER — Other Ambulatory Visit (INDEPENDENT_AMBULATORY_CARE_PROVIDER_SITE_OTHER): Payer: BC Managed Care – PPO

## 2014-01-28 ENCOUNTER — Other Ambulatory Visit: Payer: Self-pay | Admitting: Family

## 2014-01-28 DIAGNOSIS — E876 Hypokalemia: Secondary | ICD-10-CM

## 2014-01-28 LAB — BASIC METABOLIC PANEL
BUN: 15 mg/dL (ref 6–23)
CO2: 26 meq/L (ref 19–32)
Calcium: 9 mg/dL (ref 8.4–10.5)
Chloride: 101 mEq/L (ref 96–112)
Creatinine, Ser: 0.9 mg/dL (ref 0.4–1.2)
GFR: 81.35 mL/min (ref >60.00)
Glucose, Bld: 188 mg/dL — ABNORMAL HIGH (ref 70–99)
POTASSIUM: 3.3 meq/L — AB (ref 3.5–5.1)
SODIUM: 136 meq/L (ref 135–145)

## 2014-01-29 ENCOUNTER — Other Ambulatory Visit: Payer: Self-pay | Admitting: Family

## 2014-01-29 DIAGNOSIS — E876 Hypokalemia: Secondary | ICD-10-CM

## 2014-01-29 NOTE — Telephone Encounter (Signed)
Potassium is still low with once daily dosing of potassium.  I would recommend that she increase potassium from on tab to 2 tabs once daily and repeat bmet in 1-2 weeks.  Need to make sure that potassium normalizes.

## 2014-01-30 NOTE — Telephone Encounter (Signed)
Left detailed message on pt's cell# to call and schedule lab appt in 2 weeks. Future order placed.

## 2014-02-02 NOTE — Telephone Encounter (Signed)
Pt has scheduled lab appt for 02/13/2014 at 8:15.

## 2014-02-04 ENCOUNTER — Encounter: Payer: Self-pay | Admitting: Family

## 2014-02-13 ENCOUNTER — Other Ambulatory Visit (INDEPENDENT_AMBULATORY_CARE_PROVIDER_SITE_OTHER): Payer: BC Managed Care – PPO

## 2014-02-13 DIAGNOSIS — E876 Hypokalemia: Secondary | ICD-10-CM

## 2014-02-13 LAB — BASIC METABOLIC PANEL
BUN: 15 mg/dL (ref 6–23)
CO2: 24 mEq/L (ref 19–32)
Calcium: 9.3 mg/dL (ref 8.4–10.5)
Chloride: 101 mEq/L (ref 96–112)
Creatinine, Ser: 1 mg/dL (ref 0.4–1.2)
GFR: 75.5 mL/min (ref >60.00)
Glucose, Bld: 149 mg/dL — ABNORMAL HIGH (ref 70–99)
Potassium: 3.1 mEq/L — ABNORMAL LOW (ref 3.5–5.1)
Sodium: 138 mEq/L (ref 135–145)

## 2014-02-14 ENCOUNTER — Telehealth: Payer: Self-pay | Admitting: Family

## 2014-02-14 DIAGNOSIS — E876 Hypokalemia: Secondary | ICD-10-CM

## 2014-02-14 MED ORDER — LISINOPRIL 30 MG PO TABS: 30.0000 mg | ORAL_TABLET | Freq: Every day | ORAL | Status: DC

## 2014-02-14 NOTE — Telephone Encounter (Signed)
See my chart message

## 2014-02-16 MED ORDER — POTASSIUM CHLORIDE CRYS ER 20 MEQ PO TBCR: EXTENDED_RELEASE_TABLET | ORAL | Status: DC

## 2014-02-16 NOTE — Addendum Note (Signed)
Addended by: Kelle Darting A on: 02/16/2014 07:02 PM   Modules accepted: Orders

## 2014-03-04 ENCOUNTER — Other Ambulatory Visit: Payer: Self-pay | Admitting: Family

## 2014-03-06 ENCOUNTER — Encounter: Payer: Self-pay | Admitting: Family

## 2014-03-06 ENCOUNTER — Telehealth: Payer: Self-pay | Admitting: *Deleted

## 2014-03-06 ENCOUNTER — Ambulatory Visit (INDEPENDENT_AMBULATORY_CARE_PROVIDER_SITE_OTHER): Payer: BC Managed Care – PPO | Admitting: Family

## 2014-03-06 VITALS — BP 136/82 | HR 69 | Temp 97.6°F | Resp 16 | Ht 63.0 in | Wt 188.6 lb

## 2014-03-06 DIAGNOSIS — I10 Essential (primary) hypertension: Secondary | ICD-10-CM

## 2014-03-06 DIAGNOSIS — E119 Type 2 diabetes mellitus without complications: Secondary | ICD-10-CM

## 2014-03-06 DIAGNOSIS — D509 Iron deficiency anemia, unspecified: Secondary | ICD-10-CM

## 2014-03-06 LAB — BASIC METABOLIC PANEL
BUN: 10 mg/dL (ref 6–23)
CALCIUM: 9.5 mg/dL (ref 8.4–10.5)
CO2: 22 meq/L (ref 19–32)
Chloride: 107 mEq/L (ref 96–112)
Creat: 0.77 mg/dL (ref 0.50–1.10)
GLUCOSE: 75 mg/dL (ref 70–99)
Potassium: 3.5 mEq/L (ref 3.5–5.3)
Sodium: 140 mEq/L (ref 135–145)

## 2014-03-06 NOTE — Telephone Encounter (Signed)
Pt left diltiazem bottle in exam room. Attempted to reach pt and left detailed message that she may pick it up on Monday and to come by the office and ask for Gilmore Laroche.

## 2014-03-06 NOTE — Assessment & Plan Note (Signed)
Discussed importance of diabetic diet, weight loss. Obtain follow up A1C.

## 2014-03-06 NOTE — Progress Notes (Signed)
Subjective:    Patient ID: Cassidy Harrell, female    DOB: February 05, 1951, 64 y.o.   MRN: [id number]  HPI  [name] is a 64 yr old female who presents today for follow up.  1) Hypertension- Patient is currently maintained on the following medications for blood pressure: diliazem, lisinopril, metoprolol. Patient reports good compliance with blood pressure medications. Patient denies chest pain, or swelling. Does get winded when she walks up the 2 flights of stairs to her apartment.  Last 3 blood pressure readings in our office are as follows:  BP Readings from Last 3 Encounters:  03/06/14 136/82  11/24/13 119/93  08/25/13 144/84   2) anemia-  Continues iron supplement which she has been on "for a long time."  Lab Results  Component Value Date   WBC 4.5 08/25/2013   HGB 12.4 08/25/2013   HCT 36.0 08/25/2013   MCV 80.5 08/25/2013   PLT 229 08/25/2013   3) DM2- borderline.  Lab Results  Component Value Date   HGBA1C 6.5 11/24/2013   4) nasal congestion-  Runny nose/congestion since this AM, slight HA, took coricidin this AM.  This helped her a lot. Feeling better.  Review of Systems See HPI  Past Medical History  Diagnosis Date  . Hypertension   . Phlebitis   . Irregular heart beat   . Arthritis   . Depression   . Personal history of colonic polyps   . Heart murmur   . History of syphilis   . Gum disease     History   Social History  . Marital Status: Divorced    Spouse Name: N/A    Number of Children: 1  . Years of Education: N/A   Occupational History  . Not on file.   Social History Main Topics  . Smoking status: Former Smoker    Types: Cigarettes  . Smokeless tobacco: Never Used  . Alcohol Use: No  . Drug Use: No  . Sexual Activity: Not on file   Other Topics Concern  . 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 Therapeutic abortion  [place]    Family History  Problem Relation Age of Onset  . Diabetes Mother   . Cancer Father     lung cancer  . Kidney disease Father   . Seizures Father   . Cancer Brother     colon  . Heart attack Brother   . Stroke Brother   . Colon cancer Neg Hx   . Stomach cancer Neg Hx     No Known Allergies  Current Outpatient Prescriptions on File Prior to Visit  Medication Sig Dispense Refill  . aspirin 81 MG chewable tablet Chew 81 mg by mouth daily.      . Calcium Carbonate-Vitamin D (CALTRATE 600+D) 600-400 MG-UNIT per tablet Take 1 tablet by mouth 2 (two) times daily.    Marland Kitchen diltiazem (CARDIZEM CD) 180 MG 24 hr capsule TAKE ONE CAPSULE BY MOUTH  ONCE DAILY 30 capsule 6  . Ferrous Gluconate (IRON) 240 (27 FE) MG TABS Take 1 tablet by mouth daily.      Marland Kitchen lisinopril (PRINIVIL,ZESTRIL) 30 MG tablet Take 1 tablet (30 mg total) by mouth daily. 30 tablet 2  . metoprolol tartrate (LOPRESSOR) 25 MG tablet TAKE ONE TABLET BY MOUTH ONCE DAILY 30 tablet 5  . potassium chloride SA (K-DUR,KLOR-CON) 20 MEQ tablet Take 2 tablets now and then take 2 tablets again in 4 hours, then stop. 30 tablet 0   No current facility-administered medications on file prior to visit.    BP 136/82 mmHg  Pulse 69  Temp(Src) 97.6 F (36.4 C) (Oral)  Resp 16  Ht 5\' 3"  (1.6 m)  Wt 188 lb 9.6 oz (85.548 kg)  BMI 33.42 kg/m2  SpO2 99%       Objective:   Physical Exam  Constitutional: She is oriented to person, place, and time. She appears well-developed and well-nourished. No distress.  HENT:  Head: Normocephalic and atraumatic.  Cardiovascular: Normal rate and regular rhythm.   No murmur  heard. Pulmonary/Chest: Effort normal and breath sounds normal. No respiratory distress. She has no wheezes. She has no rales. She exhibits no tenderness.  Musculoskeletal: She exhibits no edema.  Neurological: She is alert and oriented to person, place, and time.  Psychiatric: She has a normal mood and affect. Her behavior is normal. Judgment and thought content normal.          Assessment & Plan:

## 2014-03-06 NOTE — Assessment & Plan Note (Signed)
BP stable, obtain follow up bmet, continue current meds.

## 2014-03-06 NOTE — Assessment & Plan Note (Signed)
Stable, continue iron supplement.

## 2014-03-06 NOTE — Progress Notes (Signed)
Pre visit review using our clinic review tool, if applicable. No additional management support is needed unless otherwise documented below in the visit note. 

## 2014-03-06 NOTE — Patient Instructions (Signed)
Please complete lab work prior to leaving. Follow up in 3 months.  

## 2014-03-07 LAB — HEMOGLOBIN A1C
HEMOGLOBIN A1C: 6.4 % — AB (ref <5.7)
MEAN PLASMA GLUCOSE: 137 mg/dL — AB (ref <117)

## 2014-03-09 ENCOUNTER — Encounter: Payer: Self-pay | Admitting: Family

## 2014-03-09 NOTE — Telephone Encounter (Signed)
Pt picked up bottles today.

## 2014-03-10 ENCOUNTER — Encounter: Payer: Self-pay | Admitting: Family

## 2014-03-13 ENCOUNTER — Ambulatory Visit: Payer: BC Managed Care – PPO | Admitting: Family

## 2014-03-24 ENCOUNTER — Ambulatory Visit: Payer: Self-pay | Admitting: Family

## 2014-04-08 ENCOUNTER — Other Ambulatory Visit: Payer: Self-pay

## 2014-04-08 DIAGNOSIS — Z1231 Encounter for screening mammogram for malignant neoplasm of breast: Secondary | ICD-10-CM

## 2014-04-21 ENCOUNTER — Ambulatory Visit
Admission: RE | Admit: 2014-04-21 | Discharge: 2014-04-21 | Disposition: A | Discharge status: Home / Self Care | Payer: BLUE CROSS/BLUE SHIELD | Source: Ambulatory Visit

## 2014-04-21 DIAGNOSIS — Z1231 Encounter for screening mammogram for malignant neoplasm of breast: Secondary | ICD-10-CM

## 2014-05-14 ENCOUNTER — Other Ambulatory Visit: Payer: Self-pay

## 2014-05-14 ENCOUNTER — Telehealth: Payer: Self-pay | Admitting: Family

## 2014-05-14 MED ORDER — LISINOPRIL 30 MG PO TABS: 30.0000 mg | ORAL_TABLET | Freq: Every day | ORAL | Status: DC

## 2014-05-14 NOTE — Telephone Encounter (Signed)
Caller name: [place] Relation to pt: self Call back number: [phone] [facility]: [facility] [address]   Reason for call:   Requesting lisinopril refill. Has two pills left

## 2014-05-14 NOTE — Telephone Encounter (Signed)
Refills sent

## 2014-05-22 ENCOUNTER — Encounter: Payer: Self-pay | Admitting: Family

## 2014-05-28 ENCOUNTER — Ambulatory Visit (INDEPENDENT_AMBULATORY_CARE_PROVIDER_SITE_OTHER): Payer: BLUE CROSS/BLUE SHIELD | Admitting: Medical

## 2014-05-28 ENCOUNTER — Telehealth: Payer: Self-pay | Admitting: Family

## 2014-05-28 ENCOUNTER — Encounter: Payer: Self-pay | Admitting: Medical

## 2014-05-28 VITALS — BP 143/67 | HR 73 | Temp 98.2°F | Ht 63.0 in | Wt 189.4 lb

## 2014-05-28 DIAGNOSIS — M609 Myositis, unspecified: Secondary | ICD-10-CM

## 2014-05-28 DIAGNOSIS — R52 Pain, unspecified: Secondary | ICD-10-CM | POA: Diagnosis not present

## 2014-05-28 DIAGNOSIS — J029 Acute pharyngitis, unspecified: Secondary | ICD-10-CM | POA: Diagnosis not present

## 2014-05-28 DIAGNOSIS — IMO0001 Reserved for inherently not codable concepts without codable children: Secondary | ICD-10-CM | POA: Insufficient documentation

## 2014-05-28 DIAGNOSIS — J309 Allergic rhinitis, unspecified: Secondary | ICD-10-CM | POA: Insufficient documentation

## 2014-05-28 DIAGNOSIS — M791 Myalgia: Secondary | ICD-10-CM

## 2014-05-28 MED ORDER — DILTIAZEM HCL ER COATED BEADS 180 MG PO CP24: 180.0000 mg | ORAL_CAPSULE | Freq: Every day | ORAL | Status: DC

## 2014-05-28 MED ORDER — MOMETASONE FUROATE 50 MCG/ACT NA SUSP: NASAL | Status: DC

## 2014-05-28 MED ORDER — BENZONATATE 100 MG PO CAPS: 100.0000 mg | ORAL_CAPSULE | Freq: Three times a day (TID) | ORAL | Status: DC | PRN

## 2014-05-28 MED ORDER — AZITHROMYCIN 250 MG PO TABS: ORAL_TABLET | ORAL | Status: DC

## 2014-05-28 MED ORDER — LISINOPRIL 30 MG PO TABS: 30.0000 mg | ORAL_TABLET | Freq: Every day | ORAL | Status: DC

## 2014-05-28 NOTE — Assessment & Plan Note (Signed)
Recommend allegra otc and will rx nasonex nasal spray. Rx of benzonatate for cough

## 2014-05-28 NOTE — Progress Notes (Signed)
Pre visit review using our clinic review tool, if applicable. No additional management support is needed unless otherwise documented below in the visit note. 

## 2014-05-28 NOTE — Patient Instructions (Addendum)
Acute pharyngitis Your strep test was negative. However, your physical exam and clinical presentation is suspicious for strep and it is important to note that rapid strep test can be falsely negative. So I am going to give you azithromycin antibiotic today based on your exam and clinical presentation.  Rest hydrate, tylenol for fever, and warm salt water gargles.   Follow up in 7 days or as needed.      Myalgia and myositis Flu test was negative. Clinically not that suspicious.   Allergic rhinitis Recommend allegra otc and will rx nasonex nasal spray. Rx of benzonatate for cough

## 2014-05-28 NOTE — Telephone Encounter (Signed)
Caller name:Laufer Lesha Relation to ZY:YQMG Call back [phone] Pharmacy:  Reason for call: pt saw Percell Miller today, and she states her test was negative for strep but the exam states suspicious for strep, pt states her job is requiring a note from her doctor stating that it is ok for her to work and is not contagious. Please fax to [phone] Attn Marzella Schlein.Marland Kitchen

## 2014-05-28 NOTE — Telephone Encounter (Signed)
Work not faxed to Cassidy Harrell per patient request.

## 2014-05-28 NOTE — Progress Notes (Signed)
Subjective:    Patient ID: Cassidy Harrell, female    DOB: Sep 05, 1950, 64 y.o.   MRN: [id number]  HPI   Pt in with sore throat since last wed. Not severe. Voice is hoarse. Fever, chills and some bodyaches yestereay. Body aches today area no longer present. Mild bodyaches yesterday.   Some nasal congestion, sneezing and itching eyes. Faint frontal sinus pressure today.    Review of Systems  Constitutional: Positive for fever and chills. Negative for fatigue.       Mild yesterday.  HENT: Positive for congestion, sinus pressure, sore throat and voice change. Negative for ear pain, nosebleeds, postnasal drip and trouble swallowing.   Respiratory: Positive for cough. Negative for chest tightness and wheezing.        Very mild now.  Cardiovascular: Negative for chest pain and palpitations.  Musculoskeletal: Positive for myalgias. Negative for back pain.       Mild myalgia yesterday  but none presently.  Neurological: Negative for dizziness, seizures, facial asymmetry, weakness and light-headedness.  Hematological: Negative for adenopathy. Does not bruise/bleed easily.   Past Medical History  Diagnosis Date  . Hypertension   . Phlebitis   . Irregular heart beat   . Arthritis   . Depression   . Personal history of colonic polyps   . Heart murmur   . History of syphilis   . Gum disease     History   Social History  . Marital Status: Divorced    Spouse Name: N/A  . Number of Children: 1  . Years of Education: N/A   Occupational History  . Not on file.   Social History Main Topics  . Smoking status: Former Smoker    Types: Cigarettes  . Smokeless tobacco: Never Used  . Alcohol Use: No  . Drug Use: No  . Sexual Activity: Not on file   Other Topics Concern  . 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 Therapeutic abortion  [place]    Family History  Problem Relation Age of Onset  . Diabetes Mother   . Cancer Father     lung cancer  . Kidney disease Father   . Seizures Father   . Cancer Brother     colon  . Heart attack Brother   . Stroke Brother   . Colon cancer Neg Hx   . Stomach cancer Neg Hx     No Known Allergies  Current Outpatient Prescriptions on File Prior to Visit  Medication Sig Dispense Refill  . aspirin 81 MG chewable tablet Chew 81 mg by mouth daily.      . Calcium Carbonate-Vitamin D (CALTRATE 600+D) 600-400 MG-UNIT per tablet Take 1 tablet by mouth 2 (two) times daily.    Marland Kitchen diltiazem (CARDIZEM CD) 180 MG 24 hr capsule TAKE ONE CAPSULE BY MOUTH ONCE DAILY 30 capsule 6  . Ferrous Gluconate (IRON) 240 (27 FE) MG TABS Take 1 tablet by mouth daily.      Marland Kitchen  lisinopril (PRINIVIL,ZESTRIL) 30 MG tablet Take 1 tablet (30 mg total) by mouth daily. 30 tablet 1  . metoprolol tartrate (LOPRESSOR) 25 MG tablet TAKE ONE TABLET BY MOUTH ONCE DAILY 30 tablet 5   No current facility-administered medications on file prior to visit.    BP 143/67 mmHg  Pulse 73  Temp(Src) 98.2 F (36.8 C) (Oral)  Ht 5\' 3"  (1.6 m)  Wt 189 lb 6.4 oz (85.911 kg)  BMI 33.56 kg/m2  SpO2 97%      Objective:   Physical Exam  General  Mental Status - Alert. General Appearance - Well groomed. Not in acute distress.  Skin Rashes- No Rashes.  HEENT Head- Normal. Ear Auditory Canal - Left- Normal. Right - Normal.Tympanic Membrane- Left- Normal. Right- Normal. Eye Sclera/Conjunctiva- Left- Normal. Right- Normal. Nose & Sinuses Nasal Mucosa- Left-  Boggy and Congested. Right-  Boggy and  Congested.no ilateral maxillary and frontal sinus  pressure. Mouth & Throat Lips: Upper Lip- Normal: no dryness, cracking, pallor, cyanosis, or vesicular eruption. Lower Lip-Normal: no dryness, cracking, pallor, cyanosis or vesicular eruption. Buccal Mucosa- Bilateral- No Aphthous ulcers. Oropharynx- No Discharge or Erythema. +pnd Tonsils: Characteristics- Bilateral- No Erythema or Congestion. Size/Enlargement- Bilateral- No enlargement. Discharge- bilateral-None.  Neck Neck- Supple. No Masses.   Chest and Lung Exam Auscultation: Breath Sounds:-Clear even and unlabored.  Cardiovascular Auscultation:Rythm- Regular, rate and rhythm. Murmurs & Other Heart Sounds:Ausculatation of the heart reveal- No Murmurs.  Lymphatic Head & Neck General Head & Neck Lymphatics: Bilateral: Description- No Localized lymphadenopathy.       Assessment & Plan:

## 2014-05-28 NOTE — Assessment & Plan Note (Signed)
Your strep test was negative. However, your physical exam and clinical presentation is suspicious for strep and it is important to note that rapid strep test can be falsely negative. So I am going to give you azithromycin antibiotic today based on your exam and clinical presentation.  Rest hydrate, tylenol for fever, and warm salt water gargles.   Follow up in 7 days or as needed.

## 2014-05-28 NOTE — Assessment & Plan Note (Signed)
Flu test was negative. Clinically not that suspicious.

## 2014-05-29 NOTE — Telephone Encounter (Signed)
Patient states that fax was not received. Faxed work note again to [employer]

## 2014-06-02 ENCOUNTER — Encounter: Payer: Self-pay | Admitting: Medical

## 2014-06-02 NOTE — Progress Notes (Signed)
Re-faxed work note to patients employer.

## 2014-08-24 ENCOUNTER — Other Ambulatory Visit: Payer: Self-pay

## 2014-09-03 ENCOUNTER — Encounter: Payer: Self-pay | Admitting: Family

## 2014-09-07 ENCOUNTER — Telehealth: Payer: Self-pay | Admitting: Family

## 2014-09-08 NOTE — Telephone Encounter (Signed)
30 day supply of Metoprolol sent to pharmacy. Pt is past due for follow up with PCP. Please call pt to arrange appt.  Thanks!

## 2014-09-09 NOTE — Telephone Encounter (Signed)
Patient called back and scheduled appointment. She is also wanting a refill of cardizem.

## 2014-09-09 NOTE — Telephone Encounter (Signed)
Informed patient of med refill and she states that she will have to callback to schedule an appointment

## 2014-09-09 NOTE — Telephone Encounter (Signed)
Notified pt to contact pharmacy and ask them to refill from Rx sent 05/28/14. Pt voices understanding.

## 2014-09-16 ENCOUNTER — Ambulatory Visit (INDEPENDENT_AMBULATORY_CARE_PROVIDER_SITE_OTHER): Payer: BLUE CROSS/BLUE SHIELD | Admitting: Family

## 2014-09-16 ENCOUNTER — Encounter: Payer: Self-pay | Admitting: Family

## 2014-09-16 VITALS — BP 128/86 | HR 77 | Temp 97.8°F | Resp 16 | Ht 63.0 in | Wt 185.8 lb

## 2014-09-16 DIAGNOSIS — D649 Anemia, unspecified: Secondary | ICD-10-CM | POA: Diagnosis not present

## 2014-09-16 DIAGNOSIS — I10 Essential (primary) hypertension: Secondary | ICD-10-CM

## 2014-09-16 DIAGNOSIS — E119 Type 2 diabetes mellitus without complications: Secondary | ICD-10-CM | POA: Diagnosis not present

## 2014-09-16 MED ORDER — LISINOPRIL-HYDROCHLOROTHIAZIDE 20-25 MG PO TABS: 1.0000 | ORAL_TABLET | Freq: Every day | ORAL | Status: DC

## 2014-09-16 NOTE — Progress Notes (Signed)
Pre visit review using our clinic review tool, if applicable. No additional management support is needed unless otherwise documented below in the visit note. 

## 2014-09-16 NOTE — Assessment & Plan Note (Signed)
Blood pressure looks good but she has a great deal of swelling. Will change lisinopril 30mg  to lisinopril/hctz 20-25mg .  Continue diltiazem (consider changing diltiazem if swelling persists with addition of hctz.

## 2014-09-16 NOTE — Assessment & Plan Note (Signed)
Obtain a1c, urine microalbumin, bmet. Lipid panel.

## 2014-09-16 NOTE — Progress Notes (Signed)
Subjective:    Patient ID: Cassidy Harrell, female    DOB: Oct 07, 1950, 64 y.o.   MRN: [id number]  HPI  Cassidy Harrell is a 64 yr old female who presents today for follow up.  Patient presents today for follow up of multiple medical problems.  Diabetes Type 2  Pt is currently maintained on the following medications for diabetes:none, diet only.   Lab Results  Component Value Date   HGBA1C 6.4* 03/06/2014   HGBA1C 6.5 11/24/2013   HGBA1C 6.6* 08/25/2013    Lab Results  Component Value Date   MICROALBUR 0.8 11/24/2013   LDLCALC 9 09/16/2012   CREATININE 0.77 03/06/2014    Last diabetic eye exam was: 2/16- normal per pt Hypertension  Patient is currently maintained on the following medications for blood pressure: lisinopril 20mg , diltiazem Patient reports good compliance with blood pressure medications. Reports + LE edema which is chronic and worse in the evenings. Last 3 blood pressure readings in our office are as follows: BP Readings from Last 3 Encounters:  09/16/14 128/86  05/28/14 143/67  03/06/14 136/82    Review of Systems See HPI  Past Medical History  Diagnosis Date  . Hypertension   . Phlebitis   . Irregular heart beat   . Arthritis   . Depression   . Personal history of colonic polyps   . Heart murmur   . History of syphilis   . Gum disease     History   Social History  . Marital Status: Divorced    Spouse Name: N/A  . Number of Children: 1  . Years of Education: N/A   Occupational History  . Not on file.   Social History Main Topics  . Smoking status: Former Smoker    Types: Cigarettes  . Smokeless tobacco: Never Used  . Alcohol Use: No  . Drug Use: No  . Sexual Activity: Not on file   Other Topics Concern  . 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 Therapeutic abortion  [place]    Family History  Problem Relation Age of Onset  . Diabetes Mother   . Cancer Father     lung cancer  . Kidney disease Father   . Seizures Father   . Cancer Brother     colon  . Heart attack Brother   . Stroke Brother   . Colon cancer Neg Hx   . Stomach cancer Neg Hx     No Known Allergies  Current Outpatient Prescriptions on File Prior to Visit  Medication Sig Dispense Refill  . aspirin 81 MG chewable tablet Chew 81 mg by mouth daily.      . Calcium Carbonate-Vitamin D (CALTRATE 600+D) 600-400 MG-UNIT per tablet Take 1 tablet by mouth 2 (two) times daily.    Marland Kitchen diltiazem (CARDIZEM CD) 180 MG 24 hr capsule Take 1 capsule (180 mg total) by mouth daily. 30 capsule 6  . DM-APAP-CPM (CORICIDIN HBP FLU PO)  Take 2 tablets by mouth every 6 (six) hours as needed.    . Ferrous Gluconate (IRON) 240 (27 FE) MG TABS Take 1 tablet by mouth daily.      . metoprolol tartrate (LOPRESSOR) 25 MG tablet TAKE ONE TABLET BY MOUTH ONCE DAILY 30 tablet 0  . mometasone (NASONEX) 50 MCG/ACT nasal spray 2 sprays each nares q day 17 g 2   No current facility-administered medications on file prior to visit.    BP 128/86 mmHg  Pulse 77  Temp(Src) 97.8 F (36.6 C) (Oral)  Resp 16  Ht 5\' 3"  (1.6 m)  Wt 185 lb 12.8 oz (84.278 kg)  BMI 32.92 kg/m2  SpO2 99%       Objective:   Physical Exam  Constitutional: She is oriented to person, place, and time. She appears well-developed and well-nourished.  Cardiovascular: Normal rate, regular rhythm and normal heart sounds.   No murmur heard. Pulmonary/Chest: Effort normal and breath sounds normal. No respiratory distress. She has no wheezes.  Musculoskeletal:  3+ bilateral pedal edema    Neurological: She is alert and oriented to person, place, and time.  Psychiatric: She has a normal mood and affect. Her behavior is normal. Judgment and thought content normal.          Assessment & Plan:

## 2014-09-16 NOTE — Patient Instructions (Addendum)
Please complete lab work prior to leaving.  For Blood pressure/swelling: Stop lisinopril 30mg  and start lisinopril/hctz 20-25mg  once daily. Please check with your insurance company re:  zostavax coverage. Follow up in 2 weeks for nurse visit and blood work (bmet dx htn).  Follow up in 3 months for routine follow up visit with provider.

## 2014-09-17 ENCOUNTER — Encounter: Payer: Self-pay | Admitting: Family

## 2014-09-17 LAB — LIPID PANEL
Cholesterol: 79 mg/dL (ref 0–200)
HDL: 47.9 mg/dL (ref >39.00)
LDL Cholesterol: 8 mg/dL (ref 0–99)
NONHDL: 31.1
Total CHOL/HDL Ratio: 2
Triglycerides: 115 mg/dL (ref 0.0–149.0)
VLDL: 23 mg/dL (ref 0.0–40.0)

## 2014-09-17 LAB — BASIC METABOLIC PANEL
BUN: 11 mg/dL (ref 6–23)
CHLORIDE: 105 meq/L (ref 96–112)
CO2: 25 mEq/L (ref 19–32)
Calcium: 9.4 mg/dL (ref 8.4–10.5)
Creatinine, Ser: 0.79 mg/dL (ref 0.40–1.20)
GFR: 94.36 mL/min (ref >60.00)
GLUCOSE: 72 mg/dL (ref 70–99)
Potassium: 3.7 mEq/L (ref 3.5–5.1)
Sodium: 139 mEq/L (ref 135–145)

## 2014-09-17 LAB — MICROALBUMIN / CREATININE URINE RATIO
Creatinine,U: 149.8 mg/dL
MICROALB UR: 0.7 mg/dL (ref 0.0–1.9)
Microalb Creat Ratio: 0.5 mg/g (ref 0.0–30.0)

## 2014-09-17 LAB — HEMOGLOBIN A1C: Hgb A1c MFr Bld: 6 % (ref 4.6–6.5)

## 2014-09-23 ENCOUNTER — Encounter: Payer: Self-pay | Admitting: Cardiovascular Disease

## 2014-10-06 ENCOUNTER — Other Ambulatory Visit: Payer: Self-pay | Admitting: Family

## 2014-10-06 MED ORDER — METOPROLOL TARTRATE 25 MG PO TABS: 25.0000 mg | ORAL_TABLET | Freq: Every day | ORAL | Status: DC

## 2014-10-09 ENCOUNTER — Ambulatory Visit (INDEPENDENT_AMBULATORY_CARE_PROVIDER_SITE_OTHER): Payer: BLUE CROSS/BLUE SHIELD | Admitting: Family

## 2014-10-09 ENCOUNTER — Telehealth: Payer: Self-pay | Admitting: Family

## 2014-10-09 ENCOUNTER — Encounter: Payer: Self-pay | Admitting: Family

## 2014-10-09 VITALS — BP 130/68 | HR 78 | Temp 97.5°F | Ht 62.0 in | Wt 184.0 lb

## 2014-10-09 DIAGNOSIS — I10 Essential (primary) hypertension: Secondary | ICD-10-CM | POA: Diagnosis not present

## 2014-10-09 DIAGNOSIS — E119 Type 2 diabetes mellitus without complications: Secondary | ICD-10-CM

## 2014-10-09 LAB — BASIC METABOLIC PANEL
BUN: 12 mg/dL (ref 6–23)
CO2: 28 mEq/L (ref 19–32)
Calcium: 9.9 mg/dL (ref 8.4–10.5)
Chloride: 100 mEq/L (ref 96–112)
Creatinine, Ser: 0.91 mg/dL (ref 0.40–1.20)
GFR: 80.14 mL/min (ref >60.00)
Glucose, Bld: 112 mg/dL — ABNORMAL HIGH (ref 70–99)
POTASSIUM: 3.3 meq/L — AB (ref 3.5–5.1)
SODIUM: 139 meq/L (ref 135–145)

## 2014-10-09 MED ORDER — POTASSIUM CHLORIDE CRYS ER 20 MEQ PO TBCR: 20.0000 meq | EXTENDED_RELEASE_TABLET | Freq: Every day | ORAL | Status: DC

## 2014-10-09 MED ORDER — METOPROLOL TARTRATE 25 MG PO TABS: 25.0000 mg | ORAL_TABLET | Freq: Every day | ORAL | Status: DC

## 2014-10-09 NOTE — Assessment & Plan Note (Addendum)
A1C improved, continue diabetic diet.   Her lipids are outstanding- she is not on statin.

## 2014-10-09 NOTE — Assessment & Plan Note (Addendum)
BP is improved- continue current meds.  Obtain bmet.

## 2014-10-09 NOTE — Patient Instructions (Signed)
Call your insurance re: coverage for shingles vaccine. Please schedule a follow up appointment in 3 months.

## 2014-10-09 NOTE — Telephone Encounter (Signed)
Potassium is low. Advise pt to start [place] 12mEQ once daily and repeat bmet in 1 week, dx hypokalemia.

## 2014-10-09 NOTE — Progress Notes (Signed)
Pre visit review using our clinic review tool, if applicable. No additional management support is needed unless otherwise documented below in the visit note. 

## 2014-10-09 NOTE — Progress Notes (Signed)
Subjective:    Patient ID: Cassidy Harrell, female    DOB: [birth year], 64 y.o.   MRN: [id number]  HPI  Cassidy Harrell is a 64 yr old female who presents today for follow up.  HTN-  Diltiazem, lisinopril-hctz, metoprolol. Denies CP/sob. Some improvement in her swelling.   BP Readings from Last 3 Encounters:  10/09/14 130/68  09/16/14 128/86  05/28/14 143/67   DM2-  Lab Results  Component Value Date   HGBA1C 6.0 09/16/2014   HGBA1C 6.4* 03/06/2014   HGBA1C 6.5 11/24/2013   Lab Results  Component Value Date   MICROALBUR 0.7 09/16/2014   LDLCALC 8 09/16/2014   CREATININE 0.79 09/16/2014       Review of Systems See HPI Past Medical History  Diagnosis Date  . Hypertension   . Phlebitis   . Irregular heart beat   . Arthritis   . Depression   . Personal history of colonic polyps   . Heart murmur   . History of syphilis   . Gum disease     Social History   Social History  . Marital Status: Divorced    Spouse Name: N/A  . Number of Children: 1  . Years of Education: N/A   Occupational History  . Not on file.   Social History Main Topics  . Smoking status: Former Smoker    Types: Cigarettes  . Smokeless tobacco: Never Used  . Alcohol Use: No  . Drug Use: No  . Sexual Activity: Not on file   Other Topics Concern  . 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 Therapeutic abortion  [place]    Family History  Problem Relation Age of Onset  . Diabetes Mother   . Cancer Father     lung cancer  . Kidney disease Father   . Seizures Father     . Cancer Brother     colon  . Heart attack Brother   . Stroke Brother   . Colon cancer Neg Hx   . Stomach cancer Neg Hx     No Known Allergies  Current Outpatient Prescriptions on File Prior to Visit  Medication Sig Dispense Refill  . aspirin 81 MG chewable tablet Chew 81 mg by mouth daily.      . Calcium Carbonate-Vitamin D (CALTRATE 600+D) 600-400 MG-UNIT per tablet Take 1 tablet by mouth 2 (two) times daily.    Marland Kitchen diltiazem (CARDIZEM CD) 180 MG 24 hr capsule Take 1 capsule (180 mg total) by mouth daily. 30 capsule 6  . Ferrous Gluconate (IRON) 240 (27 FE) MG TABS Take 1 tablet by mouth daily.      Marland Kitchen lisinopril-hydrochlorothiazide (PRINZIDE,ZESTORETIC) 20-25 MG per tablet Take 1 tablet by mouth daily. 30 tablet 3  . metoprolol tartrate (LOPRESSOR) 25 MG tablet Take 1 tablet (25 mg total) by mouth daily. 30 tablet 0   No current facility-administered medications on file prior to visit.    BP 130/68 mmHg  Pulse 78  Temp(Src) 97.5 F (36.4 C) (Oral)  Ht 5\' 2"  (1.575 m)  Wt 184 lb (83.462 kg)  BMI 33.65 kg/m2  SpO2 97%       Objective:   Physical Exam  Constitutional: She is oriented to person, place, and time. She appears well-developed and well-nourished.  Cardiovascular: Normal rate, regular rhythm and normal heart sounds.   No murmur heard. Pulmonary/Chest: Effort normal and breath sounds normal. No respiratory distress. She has no wheezes.  Musculoskeletal:  2+ bilateral pedal edema  Neurological: She is alert and oriented to person, place, and time.  Psychiatric: She has a normal mood and affect. Her behavior is normal. Judgment and thought content normal.          Assessment & Plan:

## 2014-10-09 NOTE — Telephone Encounter (Signed)
Pt notified and made aware.  She stated understanding.  States she is currently driving at time of call and will call back on Monday to schedule appointment.

## 2014-10-15 ENCOUNTER — Encounter: Payer: Self-pay | Admitting: Family

## 2014-10-28 ENCOUNTER — Ambulatory Visit: Payer: BLUE CROSS/BLUE SHIELD | Admitting: Family

## 2014-10-28 ENCOUNTER — Other Ambulatory Visit (INDEPENDENT_AMBULATORY_CARE_PROVIDER_SITE_OTHER): Payer: BLUE CROSS/BLUE SHIELD

## 2014-10-28 DIAGNOSIS — I10 Essential (primary) hypertension: Secondary | ICD-10-CM

## 2014-10-28 DIAGNOSIS — D649 Anemia, unspecified: Secondary | ICD-10-CM

## 2014-10-28 LAB — BASIC METABOLIC PANEL
BUN: 15 mg/dL (ref 6–23)
CALCIUM: 9.6 mg/dL (ref 8.4–10.5)
CO2: 26 mEq/L (ref 19–32)
Chloride: 101 mEq/L (ref 96–112)
Creatinine, Ser: 0.96 mg/dL (ref 0.40–1.20)
GFR: 75.33 mL/min (ref >60.00)
Glucose, Bld: 166 mg/dL — ABNORMAL HIGH (ref 70–99)
Potassium: 3.8 mEq/L (ref 3.5–5.1)
Sodium: 137 mEq/L (ref 135–145)

## 2014-10-28 LAB — CBC WITH DIFFERENTIAL/PLATELET
BASOS ABS: 0 10*3/uL (ref 0.0–0.1)
Basophils Relative: 0.5 % (ref 0.0–3.0)
Eosinophils Absolute: 0.1 10*3/uL (ref 0.0–0.7)
Eosinophils Relative: 3 % (ref 0.0–5.0)
HCT: 40.2 % (ref 36.0–46.0)
Hemoglobin: 13.1 g/dL (ref 12.0–15.0)
LYMPHS ABS: 2 10*3/uL (ref 0.7–4.0)
Lymphocytes Relative: 41.1 % (ref 12.0–46.0)
MCHC: 32.8 g/dL (ref 30.0–36.0)
MCV: 84.8 fl (ref 78.0–100.0)
MONOS PCT: 7.1 % (ref 3.0–12.0)
Monocytes Absolute: 0.3 10*3/uL (ref 0.1–1.0)
NEUTROS ABS: 2.3 10*3/uL (ref 1.4–7.7)
Neutrophils Relative %: 48.3 % (ref 43.0–77.0)
PLATELETS: 198 10*3/uL (ref 150.0–400.0)
RBC: 4.74 Mil/uL (ref 3.87–5.11)
RDW: 14.3 % (ref 11.5–15.5)
WBC: 4.8 10*3/uL (ref 4.0–10.5)

## 2014-10-28 LAB — IRON: Iron: 82 ug/dL (ref 42–145)

## 2014-10-29 ENCOUNTER — Encounter: Payer: Self-pay | Admitting: Family

## 2014-11-04 ENCOUNTER — Ambulatory Visit (INDEPENDENT_AMBULATORY_CARE_PROVIDER_SITE_OTHER): Payer: BLUE CROSS/BLUE SHIELD | Admitting: Family

## 2014-11-04 ENCOUNTER — Encounter: Payer: Self-pay | Admitting: Family

## 2014-11-04 VITALS — BP 128/66 | HR 72 | Temp 97.8°F | Resp 16 | Ht 62.0 in | Wt 180.0 lb

## 2014-11-04 DIAGNOSIS — L02219 Cutaneous abscess of trunk, unspecified: Secondary | ICD-10-CM

## 2014-11-04 DIAGNOSIS — L03319 Cellulitis of trunk, unspecified: Secondary | ICD-10-CM | POA: Diagnosis not present

## 2014-11-04 MED ORDER — DOXYCYCLINE HYCLATE 100 MG PO TABS: 100.0000 mg | ORAL_TABLET | Freq: Two times a day (BID) | ORAL | Status: DC

## 2014-11-04 NOTE — Progress Notes (Signed)
Subjective:    Patient ID: Cassidy Harrell, female    DOB: 1950-04-07, 64 y.o.   MRN: [id number]  HPI  Cassidy Harrell is a 64 yr old female with chief complaint of abscess on her chest wall x 7 days. No fever. Painful, beginning to drain.  Has had abscess in the same place back in 2014.    Review of Systems See HPI  Past Medical History  Diagnosis Date  . Hypertension   . Phlebitis   . Irregular heart beat   . Arthritis   . Depression   . Personal history of colonic polyps   . Heart murmur   . History of syphilis   . Gum disease     Social History   Social History  . Marital Status: Divorced    Spouse Name: N/A  . Number of Children: 1  . Years of Education: N/A   Occupational History  . Not on file.   Social History Main Topics  . Smoking status: Former Smoker    Types: Cigarettes  . Smokeless tobacco: Never Used  . Alcohol Use: No  . Drug Use: No  . Sexual Activity: Not on file   Other Topics Concern  . 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 Therapeutic abortion  [place]    Family History  Problem Relation Age of Onset  . Diabetes Mother   . Cancer Father     lung cancer  . Kidney disease Father   . Seizures Father   . Cancer Brother     colon  . Heart attack Brother   . Stroke Brother   . Colon cancer Neg Hx   . Stomach cancer Neg Hx     No Known Allergies  Current Outpatient Prescriptions on File Prior to Visit  Medication Sig Dispense Refill  . aspirin 81 MG chewable tablet Chew 81 mg by mouth daily.      . Calcium  Carbonate-Vitamin D (CALTRATE 600+D) 600-400 MG-UNIT per tablet Take 1 tablet by mouth 2 (two) times daily.    Marland Kitchen diltiazem (CARDIZEM CD) 180 MG 24 hr capsule Take 1 capsule (180 mg total) by mouth daily. 30 capsule 6  . Ferrous Gluconate (IRON) 240 (27 FE) MG TABS Take 1 tablet by mouth daily.      Marland Kitchen lisinopril-hydrochlorothiazide (PRINZIDE,ZESTORETIC) 20-25 MG per tablet Take 1 tablet by mouth daily. 30 tablet 3  . metoprolol tartrate (LOPRESSOR) 25 MG tablet Take 1 tablet (25 mg total) by mouth daily. 30 tablet 5  . potassium chloride SA (K-DUR,KLOR-CON) 20 MEQ tablet Take 1 tablet (20 mEq total) by mouth daily. 30 tablet 1   No current facility-administered medications on file prior to visit.    BP 128/66 mmHg  Pulse 72  Temp(Src) 97.8 F (36.6 C) (Oral)  Resp 16  Ht 5\' 2"  (1.575 m)  Wt 180 lb (81.647 kg)  BMI 32.91 kg/m2  SpO2 98%  Objective:   Physical Exam  Constitutional: She is oriented to person, place, and time. She appears well-developed and well-nourished. No distress.  Neurological: She is alert and oriented to person, place, and time.  Skin:  Golf ball sized abscess noted overlying the sternum between the breasts.   Psychiatric: She has a normal mood and affect. Her behavior is normal. Judgment and thought content normal.          Assessment & Plan:

## 2014-11-04 NOTE — Progress Notes (Signed)
Pre visit review using our clinic review tool, if applicable. No additional management support is needed unless otherwise documented below in the visit note. 

## 2014-11-04 NOTE — Patient Instructions (Signed)
Start doxycycline for abscess/skin infection. Call if increased pain/swelling, redness or if you develop fever. You will be contacted about your referral to the surgeon.

## 2014-11-05 DIAGNOSIS — L03319 Cellulitis of trunk, unspecified: Secondary | ICD-10-CM | POA: Insufficient documentation

## 2014-11-05 DIAGNOSIS — L02219 Cutaneous abscess of trunk, unspecified: Secondary | ICD-10-CM | POA: Insufficient documentation

## 2014-11-05 NOTE — Assessment & Plan Note (Signed)
Procedure including risks/benefits explained to patient.  Questions were answered. After informed consent was obtained and a time out completed, abscess was infiltrated with lidocaine 1% and incision and drainage of abscess was performed. Copious amounts of foul smelling purulent drainage were expressed from the abscess.  Pt tolerated procedure well.  Drainage was sent for culture. Pt was given rx for doxcycline and advised to call if increased pain/swelling, redness or if you develop fever. Given recurrent nature of the abscess (had abscess 2014 in the same location) will refer to surgeon for further evaluation.

## 2014-11-06 ENCOUNTER — Ambulatory Visit: Payer: BLUE CROSS/BLUE SHIELD | Admitting: Family

## 2014-11-06 ENCOUNTER — Encounter: Payer: Self-pay | Admitting: Family

## 2014-11-07 LAB — WOUND CULTURE: Gram Stain: NONE SEEN

## 2014-11-11 ENCOUNTER — Ambulatory Visit (INDEPENDENT_AMBULATORY_CARE_PROVIDER_SITE_OTHER): Payer: BLUE CROSS/BLUE SHIELD | Admitting: Physician Assistant

## 2014-11-11 ENCOUNTER — Encounter: Payer: Self-pay | Admitting: Physician Assistant

## 2014-11-11 VITALS — BP 118/58 | HR 69 | Temp 97.8°F | Resp 16 | Ht 62.0 in | Wt 182.0 lb

## 2014-11-11 DIAGNOSIS — L03319 Cellulitis of trunk, unspecified: Secondary | ICD-10-CM

## 2014-11-11 DIAGNOSIS — L02219 Cutaneous abscess of trunk, unspecified: Secondary | ICD-10-CM

## 2014-11-11 NOTE — Progress Notes (Signed)
Patient presents to clinic today for 1 week follow-up of abscess and cellulitis of chest wall, having I/D performed by her PCP Melissa last week. Was started on Doxycycline and wound culture was sent. EMR reviewed and culture + for abundant proteus mirabilis. Patient endorses taking antibiotic as directed with resolution of symptoms. Denies fever, chills, pain or drainage from site. Has appointment scheduled with General Surgery due to history of recurrent abscesses.   Past Medical History  Diagnosis Date  . Hypertension   . Phlebitis   . Irregular heart beat   . Arthritis   . Depression   . Personal history of colonic polyps   . Heart murmur   . History of syphilis   . Gum disease     Current Outpatient Prescriptions on File Prior to Visit  Medication Sig Dispense Refill  . aspirin 81 MG chewable tablet Chew 81 mg by mouth daily.      . Calcium Carbonate-Vitamin D (CALTRATE 600+D) 600-400 MG-UNIT per tablet Take 1 tablet by mouth 2 (two) times daily.    Marland Kitchen diltiazem (CARDIZEM CD) 180 MG 24 hr capsule Take 1 capsule (180 mg total) by mouth daily. 30 capsule 6  . doxycycline (VIBRA-TABS) 100 MG tablet Take 1 tablet (100 mg total) by mouth 2 (two) times daily. 20 tablet 0  . Ferrous Gluconate (IRON) 240 (27 FE) MG TABS Take 1 tablet by mouth daily.      Marland Kitchen lisinopril-hydrochlorothiazide (PRINZIDE,ZESTORETIC) 20-25 MG per tablet Take 1 tablet by mouth daily. 30 tablet 3  . metoprolol tartrate (LOPRESSOR) 25 MG tablet Take 1 tablet (25 mg total) by mouth daily. 30 tablet 5  . potassium chloride SA (K-DUR,KLOR-CON) 20 MEQ tablet Take 1 tablet (20 mEq total) by mouth daily. 30 tablet 1   No current facility-administered medications on file prior to visit.    No Known Allergies  Family History  Problem Relation Age of Onset  . Diabetes Mother   . Cancer Father     lung cancer  . Kidney disease Father   . Seizures Father   . Cancer Brother     colon  . Heart attack Brother   .  Stroke Brother   . Colon cancer Neg Hx   . Stomach cancer Neg Hx     Social History   Social History  . Marital Status: Divorced    Spouse Name: N/A  . Number of Children: 1  . Years of Education: N/A   Social History Main Topics  . Smoking status: Former Smoker    Types: Cigarettes  . Smokeless tobacco: Never Used  . Alcohol Use: No  . Drug Use: No  . Sexual Activity: Not Asked   Other Topics Concern  . 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  All other ROS are negative.  BP 118/58 mmHg  Pulse 69  Temp(Src) 97.8 F (36.6 C) (Oral)  Resp 16  Ht 5\' 2"  (1.575 m)  Wt 182 lb (82.555 kg)  BMI  33.28 kg/m2  SpO2 100%  Physical Exam  Constitutional: She is oriented to person, place, and time and well-developed, well-nourished, and in no distress.  HENT:  Head: Normocephalic and atraumatic.  Eyes: Conjunctivae are normal.  Cardiovascular: Normal rate, regular rhythm, normal heart sounds and intact distal pulses.   Pulmonary/Chest: Effort normal and breath sounds normal. No respiratory distress. She has no wheezes. She has no rales. She exhibits no tenderness.  Neurological: She is alert and oriented to person, place, and time.  Skin: Skin is warm and dry.     Psychiatric: Affect normal.  Vitals reviewed.   Recent Results (from the past 2160 hour(s))  Basic metabolic panel     Status: None   Collection Time: 09/16/14  2:32 PM  Result Value Ref Range   Sodium 139 135 - 145 mEq/L   Potassium 3.7 3.5 - 5.1 mEq/L   Chloride 105 96 - 112 mEq/L   CO2 25 19 - 32 mEq/L   Glucose, Bld 72 70 - 99  mg/dL   BUN 11 6 - 23 mg/dL   Creatinine, Ser 0.79 0.40 - 1.20 mg/dL   Calcium 9.4 8.4 - 10.5 mg/dL   GFR 94.36 >60.00 mL/min  Hemoglobin A1c     Status: None   Collection Time: 09/16/14  2:32 PM  Result Value Ref Range   Hgb A1c MFr Bld 6.0 4.6 - 6.5 %    Comment: Glycemic Control Guidelines for People with Diabetes:Non Diabetic:  <6%Goal of Therapy: <7%Additional Action Suggested:  >8%   Microalbumin / creatinine urine ratio     Status: None   Collection Time: 09/16/14  2:32 PM  Result Value Ref Range   Microalb, Ur 0.7 0.0 - 1.9 mg/dL   Creatinine,U 149.8 mg/dL   Microalb Creat Ratio 0.5 0.0 - 30.0 mg/g  Lipid panel     Status: None   Collection Time: 09/16/14  2:32 PM  Result Value Ref Range   Cholesterol 79 0 - 200 mg/dL    Comment: ATP III Classification       Desirable:  < 200 mg/dL               Borderline High:  200 - 239 mg/dL          High:  > = 240 mg/dL   Triglycerides 115.0 0.0 - 149.0 mg/dL    Comment: Normal:  <150 mg/dLBorderline High:  150 - 199 mg/dL   HDL 47.90 >39.00 mg/dL   VLDL 23.0 0.0 - 40.0 mg/dL   LDL Cholesterol 8 0 - 99 mg/dL   Total CHOL/HDL Ratio 2     Comment:                Men          Women1/2 Average Risk     3.4          3.3Average Risk          5.0          4.42X Average Risk          9.6          7.13X Average Risk          15.0          11.0                       NonHDL 31.10     Comment: NOTE:  Non-HDL goal should be 30 mg/dL higher than patient's  LDL goal (i.e. LDL goal of < 70 mg/dL, would have non-HDL goal of < 100 mg/dL)  Basic metabolic panel     Status: Abnormal   Collection Time: 10/09/14  9:01 AM  Result Value Ref Range   Sodium 139 135 - 145 mEq/L   Potassium 3.3 (L) 3.5 - 5.1 mEq/L   Chloride 100 96 - 112 mEq/L   CO2 28 19 - 32 mEq/L   Glucose, Bld 112 (H) 70 - 99 mg/dL   BUN 12 6 - 23 mg/dL   Creatinine, Ser 0.91 0.40 - 1.20 mg/dL   Calcium 9.9 8.4 - 10.5 mg/dL   GFR 80.14 >60.00 mL/min  Basic metabolic panel     Status:  Abnormal   Collection Time: 10/28/14  7:48 AM  Result Value Ref Range   Sodium 137 135 - 145 mEq/L   Potassium 3.8 3.5 - 5.1 mEq/L   Chloride 101 96 - 112 mEq/L   CO2 26 19 - 32 mEq/L   Glucose, Bld 166 (H) 70 - 99 mg/dL   BUN 15 6 - 23 mg/dL   Creatinine, Ser 0.96 0.40 - 1.20 mg/dL   Calcium 9.6 8.4 - 10.5 mg/dL   GFR 75.33 >60.00 mL/min  CBC w/Diff     Status: None   Collection Time: 10/28/14  7:48 AM  Result Value Ref Range   WBC 4.8 4.0 - 10.5 K/uL   RBC 4.74 3.87 - 5.11 Mil/uL   Hemoglobin 13.1 12.0 - 15.0 g/dL   HCT 40.2 36.0 - 46.0 %   MCV 84.8 78.0 - 100.0 fl   MCHC 32.8 30.0 - 36.0 g/dL   RDW 14.3 11.5 - 15.5 %   Platelets 198.0 150.0 - 400.0 K/uL   Neutrophils Relative % 48.3 43.0 - 77.0 %   Lymphocytes Relative 41.1 12.0 - 46.0 %   Monocytes Relative 7.1 3.0 - 12.0 %   Eosinophils Relative 3.0 0.0 - 5.0 %   Basophils Relative 0.5 0.0 - 3.0 %   Neutro Abs 2.3 1.4 - 7.7 K/uL   Lymphs Abs 2.0 0.7 - 4.0 K/uL   Monocytes Absolute 0.3 0.1 - 1.0 K/uL   Eosinophils Absolute 0.1 0.0 - 0.7 K/uL   Basophils Absolute 0.0 0.0 - 0.1 K/uL  Iron     Status: None   Collection Time: 10/28/14  7:48 AM  Result Value Ref Range   Iron 82 42 - 145 ug/dL  Wound culture     Status: None   Collection Time: 11/04/14 11:49 AM  Result Value Ref Range   Culture Abundant PROTEUS MIRABILIS    Gram Stain Moderate    Gram Stain WBC present-predominately Mononuclear    Gram Stain No Squamous Epithelial Cells Seen    Gram Stain Abundant Gram Negative Rods    Gram Stain Moderate Gram Positive Cocci In Pairs In Clusters    Organism ID, Bacteria PROTEUS MIRABILIS     Comment: This organism may show imipenem resistance by mechanisms other than a carbapenemase.       Susceptibility   Proteus mirabilis -  (no method available)    AMPICILLIN <=2 Sensitive     AMOX/CLAVULANIC <=2 Sensitive     AMPICILLIN/SULBACTAM <=2 Sensitive     PIP/TAZO <=4 Sensitive     IMIPENEM 2 Intermediate      CEFTRIAXONE <=1 Sensitive     CEFTAZIDIME <=1 Sensitive     CEFEPIME <=1 Sensitive     GENTAMICIN <=1 Sensitive     TOBRAMYCIN <=1  Sensitive     CIPROFLOXACIN <=0.25 Sensitive     LEVOFLOXACIN <=0.12 Sensitive     TRIMETH/SULFA* <=20 Sensitive      * ORAL therapy:A cefazolin MIC of <32 predicts susceptibility to the oral agents cefaclor,cefdinir,cefpodoxime,cefprozil,cefuroxime,cephalexin,and loracarbef when used for therapy of uncomplicated UTIs due to E.coli,K.pneumomiae,and P.mirabilis. PARENTERAL therapy: A cefazolinMIC of >8 indicates resistance to parenteralcefazolin. An alternate test method must beperformed to confirm susceptibility to parenteralcefazolin.    Assessment/Plan: Cellulitis and abscess of trunk Healing. Infection resolved clinically. Continue hygiene measures. Finish entire course of antibiotic. Follow-up with General Surgery as scheduled. Alarm signs/symptoms discussed that would prompt return visit and additional antibiotics.

## 2014-11-11 NOTE — Patient Instructions (Signed)
The area looks good. The infection seems to have resolved. Please finish the entire course of antibiotic. Keep the area clean and dry. Follow-up with your surgeon as scheduled.  If you notice any recurrence of pain, tenderness, redness, warmth or pus drainage, please call or come see [place] immediately.

## 2014-11-11 NOTE — Assessment & Plan Note (Signed)
Healing. Infection resolved clinically. Continue hygiene measures. Finish entire course of antibiotic. Follow-up with General Surgery as scheduled. Alarm signs/symptoms discussed that would prompt return visit and additional antibiotics.

## 2014-11-11 NOTE — Progress Notes (Signed)
Pre visit review using our clinic review tool, if applicable. No additional management support is needed unless otherwise documented below in the visit note/SLS  

## 2014-12-07 ENCOUNTER — Encounter: Payer: Self-pay | Admitting: Family

## 2014-12-08 MED ORDER — POTASSIUM CHLORIDE CRYS ER 20 MEQ PO TBCR: 20.0000 meq | EXTENDED_RELEASE_TABLET | Freq: Every day | ORAL | Status: DC

## 2015-01-24 ENCOUNTER — Other Ambulatory Visit: Payer: Self-pay | Admitting: Family

## 2015-01-26 NOTE — Telephone Encounter (Signed)
Refill sent per [facility] refill protocol/SLS  

## 2015-04-15 ENCOUNTER — Telehealth: Payer: Self-pay | Admitting: Medical

## 2015-04-20 ENCOUNTER — Other Ambulatory Visit: Payer: Self-pay | Admitting: Medical

## 2015-04-22 NOTE — Telephone Encounter (Signed)
Pt called because pharmacy does not have RX for cartia. Per med chart the transmission failed. Please resend RX to [facility] on [address]

## 2015-04-23 MED ORDER — DILTIAZEM HCL ER COATED BEADS 180 MG PO CP24: 180.0000 mg | ORAL_CAPSULE | Freq: Every day | ORAL | Status: DC

## 2015-04-23 NOTE — Telephone Encounter (Addendum)
Resent the RX for the cartia over to pharmacy. #30 w 0 rf. Spoke with pharmacist and they did not receive the original order on feb 16

## 2015-04-23 NOTE — Addendum Note (Signed)
Addended by: Ricky Ala on: 04/23/2015 10:49 AM   Modules accepted: Orders

## 2015-04-23 NOTE — Telephone Encounter (Signed)
Rx filled on 04/23/15.

## 2015-04-27 ENCOUNTER — Other Ambulatory Visit: Payer: Self-pay | Admitting: Family

## 2015-04-28 ENCOUNTER — Other Ambulatory Visit: Payer: Self-pay

## 2015-04-28 DIAGNOSIS — Z1231 Encounter for screening mammogram for malignant neoplasm of breast: Secondary | ICD-10-CM

## 2015-05-12 ENCOUNTER — Ambulatory Visit
Admission: RE | Admit: 2015-05-12 | Discharge: 2015-05-12 | Disposition: A | Discharge status: Home / Self Care | Payer: BLUE CROSS/BLUE SHIELD | Source: Ambulatory Visit

## 2015-05-12 DIAGNOSIS — Z1231 Encounter for screening mammogram for malignant neoplasm of breast: Secondary | ICD-10-CM

## 2015-05-17 ENCOUNTER — Other Ambulatory Visit: Payer: Self-pay | Admitting: Family

## 2015-05-18 ENCOUNTER — Encounter: Payer: Self-pay | Admitting: Behavioral Health

## 2015-05-18 ENCOUNTER — Telehealth: Payer: Self-pay | Admitting: Behavioral Health

## 2015-05-18 NOTE — Telephone Encounter (Signed)
Pre-Visit Call completed with patient and chart updated.   Pre-Visit Info documented in Specialty Comments under [employer].    

## 2015-05-18 NOTE — Telephone Encounter (Signed)
Last OV 11/04/14 (abscess) Metoprolol last filled 10/09/14 #30 with 5  CPE scheduled for tomorrow

## 2015-05-19 ENCOUNTER — Encounter: Payer: Self-pay | Admitting: Family

## 2015-05-19 ENCOUNTER — Ambulatory Visit (INDEPENDENT_AMBULATORY_CARE_PROVIDER_SITE_OTHER): Payer: BLUE CROSS/BLUE SHIELD | Admitting: Family

## 2015-05-19 VITALS — BP 118/78 | HR 83 | Temp 97.8°F | Resp 16 | Ht 63.25 in | Wt 182.4 lb

## 2015-05-19 DIAGNOSIS — Z0000 Encounter for general adult medical examination without abnormal findings: Secondary | ICD-10-CM | POA: Diagnosis not present

## 2015-05-19 DIAGNOSIS — Z113 Encounter for screening for infections with a predominantly sexual mode of transmission: Secondary | ICD-10-CM | POA: Diagnosis not present

## 2015-05-19 DIAGNOSIS — Z0001 Encounter for general adult medical examination with abnormal findings: Secondary | ICD-10-CM

## 2015-05-19 DIAGNOSIS — R609 Edema, unspecified: Secondary | ICD-10-CM

## 2015-05-19 DIAGNOSIS — M5090 Cervical disc disorder, unspecified, unspecified cervical region: Secondary | ICD-10-CM

## 2015-05-19 DIAGNOSIS — M5412 Radiculopathy, cervical region: Secondary | ICD-10-CM | POA: Diagnosis not present

## 2015-05-19 DIAGNOSIS — E2839 Other primary ovarian failure: Secondary | ICD-10-CM | POA: Diagnosis not present

## 2015-05-19 DIAGNOSIS — R6889 Other general symptoms and signs: Secondary | ICD-10-CM | POA: Diagnosis not present

## 2015-05-19 LAB — CBC WITH DIFFERENTIAL/PLATELET
BASOS ABS: 0 10*3/uL (ref 0.0–0.1)
Basophils Relative: 0.4 % (ref 0.0–3.0)
Eosinophils Absolute: 0.1 10*3/uL (ref 0.0–0.7)
Eosinophils Relative: 2.2 % (ref 0.0–5.0)
HEMATOCRIT: 38.6 % (ref 36.0–46.0)
HEMOGLOBIN: 12.8 g/dL (ref 12.0–15.0)
LYMPHS PCT: 44.1 % (ref 12.0–46.0)
Lymphs Abs: 2.3 10*3/uL (ref 0.7–4.0)
MCHC: 33 g/dL (ref 30.0–36.0)
MCV: 83.8 fl (ref 78.0–100.0)
MONOS PCT: 10 % (ref 3.0–12.0)
Monocytes Absolute: 0.5 10*3/uL (ref 0.1–1.0)
NEUTROS ABS: 2.2 10*3/uL (ref 1.4–7.7)
Neutrophils Relative %: 43.3 % (ref 43.0–77.0)
PLATELETS: 177 10*3/uL (ref 150.0–400.0)
RBC: 4.61 Mil/uL (ref 3.87–5.11)
RDW: 14.1 % (ref 11.5–15.5)
WBC: 5.2 10*3/uL (ref 4.0–10.5)

## 2015-05-19 LAB — BASIC METABOLIC PANEL
BUN: 13 mg/dL (ref 6–23)
CALCIUM: 9.5 mg/dL (ref 8.4–10.5)
CHLORIDE: 103 meq/L (ref 96–112)
CO2: 28 meq/L (ref 19–32)
Creatinine, Ser: 0.8 mg/dL (ref 0.40–1.20)
GFR: 92.8 mL/min (ref >60.00)
Glucose, Bld: 80 mg/dL (ref 70–99)
Potassium: 3.3 mEq/L — ABNORMAL LOW (ref 3.5–5.1)
SODIUM: 139 meq/L (ref 135–145)

## 2015-05-19 LAB — URINALYSIS, ROUTINE W REFLEX MICROSCOPIC
Bilirubin Urine: NEGATIVE
Hgb urine dipstick: NEGATIVE
Ketones, ur: NEGATIVE
LEUKOCYTES UA: NEGATIVE
Nitrite: NEGATIVE
PH: 7 (ref 5.0–8.0)
SPECIFIC GRAVITY, URINE: 1.015 (ref 1.000–1.030)
TOTAL PROTEIN, URINE-UPE24: NEGATIVE
UROBILINOGEN UA: 0.2 (ref 0.0–1.0)
Urine Glucose: NEGATIVE

## 2015-05-19 LAB — HEPATIC FUNCTION PANEL
ALBUMIN: 4.2 g/dL (ref 3.5–5.2)
ALT: 17 U/L (ref 0–35)
AST: 16 U/L (ref 0–37)
Alkaline Phosphatase: 59 U/L (ref 39–117)
BILIRUBIN DIRECT: 0.1 mg/dL (ref 0.0–0.3)
TOTAL PROTEIN: 7.9 g/dL (ref 6.0–8.3)
Total Bilirubin: 0.3 mg/dL (ref 0.2–1.2)

## 2015-05-19 LAB — LIPID PANEL
CHOL/HDL RATIO: 2
Cholesterol: 87 mg/dL (ref 0–200)
HDL: 49.5 mg/dL (ref >39.00)
LDL Cholesterol: 11 mg/dL (ref 0–99)
NONHDL: 37.93
Triglycerides: 134 mg/dL (ref 0.0–149.0)
VLDL: 26.8 mg/dL (ref 0.0–40.0)

## 2015-05-19 LAB — TSH: TSH: 3.76 u[IU]/mL (ref 0.35–4.50)

## 2015-05-19 MED ORDER — METHYLPREDNISOLONE 4 MG PO TBPK: ORAL_TABLET | ORAL | Status: DC

## 2015-05-19 MED ORDER — FUROSEMIDE 20 MG PO TABS: 20.0000 mg | ORAL_TABLET | Freq: Every day | ORAL | Status: DC | PRN

## 2015-05-19 MED ORDER — METOPROLOL TARTRATE 25 MG PO TABS: 25.0000 mg | ORAL_TABLET | Freq: Every day | ORAL | Status: DC

## 2015-05-19 MED ORDER — LISINOPRIL-HYDROCHLOROTHIAZIDE 20-25 MG PO TABS: 1.0000 | ORAL_TABLET | Freq: Every day | ORAL | Status: DC

## 2015-05-19 MED ORDER — DILTIAZEM HCL ER COATED BEADS 180 MG PO CP24: 180.0000 mg | ORAL_CAPSULE | Freq: Every day | ORAL | Status: DC

## 2015-05-19 MED ORDER — POTASSIUM CHLORIDE CRYS ER 20 MEQ PO TBCR: 20.0000 meq | EXTENDED_RELEASE_TABLET | Freq: Every day | ORAL | Status: DC

## 2015-05-19 NOTE — Assessment & Plan Note (Signed)
Discussed healthy diet, exercise, weight loss.Advised pt to check with insurance re: coverage for zostavax and schedule nurse visit.  Obtain routine lab work.

## 2015-05-19 NOTE — Progress Notes (Addendum)
Subjective:    Patient ID: Cassidy Harrell, female    DOB: 05/19/1950, 65 y.o.   MRN: [id number]  HPI  Patient presents today for complete physical.  Immunizations: tetanus up to date, pneumococcal up to date. Shingles due Diet: reports good diet.  Exercise:  Not regularly exercising Colonoscopy: up to date- due 2018 Dexa: due Pap Smear: 2/15- neg HPV, neg pap, lacked endocervical component Mammogram: up to date Dental:  Due  Vision:  due  Arm pain- reports increase in the right shoulder/arm pain.  Has burning pain. Has some tenderness in the right shoulder.    LE edema- reports to recent increased sodium intake which is why she feels her edema has worsened.   Review of Systems  Constitutional: Negative for unexpected weight change.  HENT: Negative for rhinorrhea.   Respiratory:       Mild chronic cough  Cardiovascular: Positive for leg swelling.  Gastrointestinal: Negative for diarrhea.       Mild constipation  Genitourinary: Negative for dysuria and frequency.  Musculoskeletal:       Bilateral shoulder/knee pain  Skin: Negative for rash.  Neurological: Negative for headaches.  Hematological: Negative for adenopathy.  Psychiatric/Behavioral:       Denies depression/anxiety   Past Medical History  Diagnosis Date  . Hypertension   . Phlebitis   . Irregular heart beat   . Arthritis   . Depression   . Personal history of colonic polyps   . Heart murmur   . History of syphilis   . Gum disease     Social History   Social History  . Marital Status: Divorced    Spouse Name: N/A  . Number of Children: 1  . Years of Education: N/A   Occupational History  . Not on file.   Social History Main Topics  . Smoking status: Former Smoker    Types: Cigarettes  . Smokeless tobacco: Never Used  . Alcohol Use: No  . Drug Use: No  . Sexual Activity: Not on file   Other Topics Concern  . 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 Therapeutic abortion  [place]    Family History  Problem Relation Age of Onset  . Diabetes Mother   . Cancer Father     lung cancer  . Kidney disease Father   . Seizures Father   . Cancer Brother     colon  . Heart attack Brother   . Stroke Brother   . Colon cancer Neg Hx   . Stomach cancer Neg Hx     No Known Allergies  Current Outpatient Prescriptions on File Prior to Visit  Medication Sig Dispense Refill  . aspirin 81 MG chewable tablet Chew 81 mg by mouth daily.      . Calcium Carbonate-Vitamin D (CALTRATE 600+D) 600-400 MG-UNIT per tablet Take 1 tablet by mouth 2 (two) times daily.    . Ferrous Gluconate (IRON) 240 (27 FE) MG  TABS Take 1 tablet by mouth daily.       No current facility-administered medications on file prior to visit.    BP 118/78 mmHg  Pulse 83  Temp(Src) 97.8 F (36.6 C) (Oral)  Resp 16  Ht 5' 3.25" (1.607 m)  Wt 182 lb 6.4 oz (82.736 kg)  BMI 32.04 kg/m2  SpO2 97%        Objective:   Physical Exam   Physical Exam  Constitutional: She is oriented to person, place, and time. She appears well-developed and well-nourished. No distress.  HENT:  Head: Normocephalic and atraumatic.  Right Ear: Tympanic membrane and ear canal normal.  Left Ear: Tympanic membrane and ear canal normal.  Mouth/Throat: Oropharynx is clear and moist.  Eyes: Pupils are equal, round, and reactive to light. No scleral icterus.  Neck: Normal range of motion. No thyromegaly present.  Cardiovascular: Normal rate and regular rhythm.   No murmur heard. Pulmonary/Chest: Effort normal and  breath sounds normal. No respiratory distress. He has no wheezes. She has no rales. She exhibits no tenderness.  Abdominal: Soft. Bowel sounds are normal. He exhibits no distension and no mass. There is no tenderness. There is no rebound and no guarding.  Musculoskeletal: She exhibits 2-3 + bilateral LE edema Lymphadenopathy:    She has no cervical adenopathy.  Neurological: She is alert and oriented to person, place, and time. She has normal patellar reflexes. She exhibits normal muscle tone. Coordination normal.  Skin: Skin is warm and dry.  Psychiatric: She has a normal mood and affect. Her behavior is normal. Judgment and thought content normal.  Breasts: Examined lying Right: Without masses, retractions, discharge or axillary adenopathy.  Left: Without masses, retractions, discharge or axillary adenopathy.  Inguinal/mons: Normal without inguinal adenopathy          Assessment & Plan:        Edema- rx with lasix once daily prn.   Assessment & Plan:  Abnormal EKG- EKG personally reviewed. notes first degree AV block. ? Old infarct,  Will refer to cardiology for further evaluation. See phone note.

## 2015-05-19 NOTE — Assessment & Plan Note (Signed)
I think her arm pain is radicular in nature. Rx with medrol dose pak, refer back to Neurosurgery.

## 2015-05-19 NOTE — Patient Instructions (Signed)
You will be contacted about your appointment with Dr. Hal Neer about your neck. Please start medrol dose pak. You may take lasix once daily as needed for swelling.   You will be contacted about your referral for bone density. Please work on healthy diet, exercise and weight loss.

## 2015-05-19 NOTE — Progress Notes (Signed)
Pre visit review using our clinic review tool, if applicable. No additional management support is needed unless otherwise documented below in the visit note. 

## 2015-05-20 ENCOUNTER — Encounter: Payer: Self-pay | Admitting: Family

## 2015-05-20 ENCOUNTER — Telehealth: Payer: Self-pay | Admitting: Family

## 2015-05-20 DIAGNOSIS — R9431 Abnormal electrocardiogram [ECG] [EKG]: Secondary | ICD-10-CM

## 2015-05-20 DIAGNOSIS — E876 Hypokalemia: Secondary | ICD-10-CM

## 2015-05-20 LAB — HEPATITIS C ANTIBODY: HCV AB: NEGATIVE

## 2015-05-20 NOTE — Telephone Encounter (Signed)
Please also let pt know that I reviewed her EKG.  EKG is mildly abnormal.  I would like to refer her to cardiology for further evaluation.

## 2015-05-20 NOTE — Telephone Encounter (Signed)
Called to give lab results.  Left a message for call back.

## 2015-05-20 NOTE — Telephone Encounter (Signed)
Opened in error

## 2015-05-20 NOTE — Telephone Encounter (Signed)
Hep c is negative.  K is low. Has she been taking Kdur every day? Increase Kdur to 20 meq bid, repeat bmet in 1 week.

## 2015-05-20 NOTE — Addendum Note (Signed)
Addended by: [name] on: 05/20/2015 09:09 AM   Modules accepted: Miquel Dunn

## 2015-05-21 NOTE — Telephone Encounter (Signed)
Notified pt of below and she voices understanding. States that she checked with her insurance and they will cover shingles vaccine in the office. Scheduled pt for nurse visit on 05/28/15 at 4pm for zostavax and repeat bmet (K+). Future order entered.

## 2015-05-21 NOTE — Telephone Encounter (Signed)
Pt returned your call for lab results.  

## 2015-05-24 ENCOUNTER — Encounter: Payer: Self-pay | Admitting: Family

## 2015-05-28 ENCOUNTER — Ambulatory Visit (INDEPENDENT_AMBULATORY_CARE_PROVIDER_SITE_OTHER): Payer: BLUE CROSS/BLUE SHIELD | Admitting: Behavioral Health

## 2015-05-28 DIAGNOSIS — Z23 Encounter for immunization: Secondary | ICD-10-CM | POA: Diagnosis not present

## 2015-05-28 DIAGNOSIS — E876 Hypokalemia: Secondary | ICD-10-CM

## 2015-05-28 LAB — BASIC METABOLIC PANEL
BUN: 23 mg/dL (ref 7–25)
CHLORIDE: 100 mmol/L (ref 98–110)
CO2: 28 mmol/L (ref 20–31)
CREATININE: 1.01 mg/dL — AB (ref 0.50–0.99)
Calcium: 9 mg/dL (ref 8.6–10.4)
Glucose, Bld: 122 mg/dL — ABNORMAL HIGH (ref 65–99)
POTASSIUM: 3 mmol/L — AB (ref 3.5–5.3)
SODIUM: 141 mmol/L (ref 135–146)

## 2015-05-28 NOTE — Progress Notes (Signed)
Pre visit review using our clinic review tool, if applicable. No additional management support is needed unless otherwise documented below in the visit note.  Patient in office for Zoster vaccine. SQ given in Left Arm. Patient tolerated injection well.

## 2015-05-30 ENCOUNTER — Telehealth: Payer: Self-pay | Admitting: Family

## 2015-05-30 NOTE — Telephone Encounter (Signed)
Potassium is low. Take 2 tabs of potassium today and  I would like to make the following changes her medications:  Stop lisinopril-hctz. Stop lasix. Start lisinopril and aldactone once daily instead. Stop Kdur after 2 tabs today.  Follow up in 1 week for office visit.

## 2015-05-31 MED ORDER — SPIRONOLACTONE 50 MG PO TABS: 50.0000 mg | ORAL_TABLET | Freq: Every day | ORAL | Status: DC

## 2015-05-31 MED ORDER — LISINOPRIL 20 MG PO TABS: 20.0000 mg | ORAL_TABLET | Freq: Every day | ORAL | Status: DC

## 2015-05-31 NOTE — Telephone Encounter (Signed)
Notified pt of below recommendations and she voices understanding. PCP not in the office in 1 week so scheduled f/u with PA, Hassell Done.

## 2015-05-31 NOTE — Telephone Encounter (Signed)
Left message for pt to return my call.

## 2015-06-01 ENCOUNTER — Ambulatory Visit (HOSPITAL_BASED_OUTPATIENT_CLINIC_OR_DEPARTMENT_OTHER)
Admission: RE | Admit: 2015-06-01 | Discharge: 2015-06-01 | Disposition: A | Discharge status: Home / Self Care | Payer: BLUE CROSS/BLUE SHIELD | Source: Ambulatory Visit | Attending: Family | Admitting: Family

## 2015-06-01 DIAGNOSIS — Z780 Asymptomatic menopausal state: Secondary | ICD-10-CM | POA: Insufficient documentation

## 2015-06-01 DIAGNOSIS — Z720 Tobacco use: Secondary | ICD-10-CM | POA: Diagnosis not present

## 2015-06-01 DIAGNOSIS — E2839 Other primary ovarian failure: Secondary | ICD-10-CM | POA: Insufficient documentation

## 2015-06-03 ENCOUNTER — Encounter: Payer: Self-pay | Admitting: Family

## 2015-06-03 NOTE — Telephone Encounter (Signed)
Pt sent the follow MyChart message:    Non-Urgent Medical Question     From   Cassidy Harrell    To   [name], NP    Sent   06/03/2015 9:53 AM       Hello~   My feet are very, very swollen. More so than usual.        Called to follow up with patient.  Pt states that aldactone is not working.  Started med on Monday.  Now, feet are more swollen than usual.  Pt states she typically has swelling, but not this bad.  Left leg---swelling slight above ankle.  Right leg---swelling up to knee.  She denies chest pain, shortness of breath, wheezing, or swelling elsewhere.  Pt has a follow up appt with Elyn Aquas, PA-C on Monday, 06/07/15.  She was advised to elevated legs, watch sodium intake.  Provider out of the office.  Message routed to covering provider, Dr. Etter Sjogren for any additional recommendations.

## 2015-06-03 NOTE — Telephone Encounter (Signed)
Increase aldactone to 50 mg 2 po qd and keep appointment mon

## 2015-06-04 NOTE — Telephone Encounter (Signed)
Yes 100 mg

## 2015-06-04 NOTE — Telephone Encounter (Signed)
Pt notified and made aware.  She stated understanding and agrees with plan.   

## 2015-06-04 NOTE — Telephone Encounter (Signed)
Please clarify.  Pt is taking Aldactone 50 mg by mouth daily.  Should patient increase dose to 100 mg?

## 2015-06-07 ENCOUNTER — Encounter: Payer: Self-pay | Admitting: Physician Assistant

## 2015-06-07 ENCOUNTER — Ambulatory Visit (INDEPENDENT_AMBULATORY_CARE_PROVIDER_SITE_OTHER): Payer: BLUE CROSS/BLUE SHIELD | Admitting: Physician Assistant

## 2015-06-07 VITALS — BP 110/58 | HR 77 | Temp 97.8°F | Ht 63.25 in | Wt 180.6 lb

## 2015-06-07 DIAGNOSIS — I10 Essential (primary) hypertension: Secondary | ICD-10-CM

## 2015-06-07 DIAGNOSIS — E876 Hypokalemia: Secondary | ICD-10-CM | POA: Diagnosis not present

## 2015-06-07 LAB — BASIC METABOLIC PANEL
BUN: 12 mg/dL (ref 6–23)
CALCIUM: 9.9 mg/dL (ref 8.4–10.5)
CHLORIDE: 106 meq/L (ref 96–112)
CO2: 24 meq/L (ref 19–32)
CREATININE: 0.92 mg/dL (ref 0.40–1.20)
GFR: 78.97 mL/min (ref >60.00)
Glucose, Bld: 109 mg/dL — ABNORMAL HIGH (ref 70–99)
Potassium: 4.2 mEq/L (ref 3.5–5.1)
Sodium: 139 mEq/L (ref 135–145)

## 2015-06-07 NOTE — Assessment & Plan Note (Signed)
BP improved. Swelling improved. Will continue current regimen. DASH diet reviewed. Will check BMP today to reassess potassium.

## 2015-06-07 NOTE — Progress Notes (Signed)
Patient presents to clinic today for follow-up of hypertension and hyperkalemia. Potassium noted to be at 3.0 on labs obtained last week by PCP. Patient's BP medication was chanved to a combination of Diltiazem, Lisinopril, Metoprolol and Spironolactone (switched from HCTZ that was contributing to low potassium). Patient endorses taking medications as directed -- is taking Spironolactone 50 mg BID. Is tolerating medications well. Is watching salt intake.   BP Readings from Last 3 Encounters:  06/07/15 110/58  05/19/15 118/78  11/11/14 118/58   Past Medical History  Diagnosis Date  . Hypertension   . Phlebitis   . Irregular heart beat   . Arthritis   . Depression   . Personal history of colonic polyps   . Heart murmur   . History of syphilis   . Gum disease     Current Outpatient Prescriptions on File Prior to Visit  Medication Sig Dispense Refill  . aspirin 81 MG chewable tablet Chew 81 mg by mouth daily.      . Calcium Carbonate-Vitamin D (CALTRATE 600+D) 600-400 MG-UNIT per tablet Take 1 tablet by mouth 2 (two) times daily.    Marland Kitchen diltiazem (CARTIA XT) 180 MG 24 hr capsule Take 1 capsule (180 mg total) by mouth daily. 30 capsule 5  . Ferrous Gluconate (IRON) 240 (27 FE) MG TABS Take 1 tablet by mouth daily.      Marland Kitchen lisinopril (PRINIVIL,ZESTRIL) 20 MG tablet Take 1 tablet (20 mg total) by mouth daily. 30 tablet 3  . metoprolol tartrate (LOPRESSOR) 25 MG tablet Take 1 tablet (25 mg total) by mouth daily. 30 tablet 5  . spironolactone (ALDACTONE) 50 MG tablet Take 1 tablet (50 mg total) by mouth daily. 30 tablet 3   No current facility-administered medications on file prior to visit.    No Known Allergies  Family History  Problem Relation Age of Onset  . Diabetes Mother   . Cancer Father     lung cancer  . Kidney disease Father   . Seizures Father   . Cancer Brother     colon  . Heart attack Brother   . Stroke Brother   . Colon cancer Neg Hx   . Stomach cancer Neg Hx       Social History   Social History  . Marital Status: Divorced    Spouse Name: N/A  . Number of Children: 1  . Years of Education: N/A   Social History Main Topics  . Smoking status: Former Smoker    Types: Cigarettes  . Smokeless tobacco: Never Used  . Alcohol Use: No  . Drug Use: No  . Sexual Activity: Not Asked   Other Topics Concern  . 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  All other ROS are negative.  BP 110/58 mmHg  Pulse 77  Temp(Src) 97.8 F (36.6 C) (Oral)  Ht 5' 3.25" (1.607 m)  Wt 180 lb 9.6 oz (81.92 kg)  BMI 31.72 kg/m2  SpO2 99%  Physical Exam  Constitutional: She  is oriented to person, place, and time and well-developed, well-nourished, and in no distress.  HENT:  Head: Normocephalic and atraumatic.  Eyes: Conjunctivae are normal.  Cardiovascular: Normal rate, regular rhythm, normal heart sounds and intact distal pulses.   Pulmonary/Chest: Effort normal and breath sounds normal. No respiratory distress. She has no wheezes. She has no rales. She exhibits no tenderness.  Neurological: She is alert and oriented to person, place, and time.  Skin: Skin is warm and dry. No rash noted.  Psychiatric: Affect normal.  Vitals reviewed.  Recent Results (from the past 2160 hour(s))  Hepatitis C Antibody     Status: None   Collection Time: 05/19/15 12:22 PM  Result Value Ref Range   HCV Ab NEGATIVE NEGATIVE  Basic metabolic panel     Status: Abnormal   Collection Time: 05/19/15 12:22 PM  Result Value Ref Range   Sodium 139 135 - 145 mEq/L   Potassium 3.3 (L) 3.5 - 5.1 mEq/L    Chloride 103 96 - 112 mEq/L   CO2 28 19 - 32 mEq/L   Glucose, Bld 80 70 - 99 mg/dL   BUN 13 6 - 23 mg/dL   Creatinine, Ser 0.80 0.40 - 1.20 mg/dL   Calcium 9.5 8.4 - 10.5 mg/dL   GFR 92.80 >60.00 mL/min  CBC with Differential/Platelet     Status: None   Collection Time: 05/19/15 12:22 PM  Result Value Ref Range   WBC 5.2 4.0 - 10.5 K/uL   RBC 4.61 3.87 - 5.11 Mil/uL   Hemoglobin 12.8 12.0 - 15.0 g/dL   HCT 38.6 36.0 - 46.0 %   MCV 83.8 78.0 - 100.0 fl   MCHC 33.0 30.0 - 36.0 g/dL   RDW 14.1 11.5 - 15.5 %   Platelets 177.0 150.0 - 400.0 K/uL   Neutrophils Relative % 43.3 43.0 - 77.0 %   Lymphocytes Relative 44.1 12.0 - 46.0 %   Monocytes Relative 10.0 3.0 - 12.0 %   Eosinophils Relative 2.2 0.0 - 5.0 %   Basophils Relative 0.4 0.0 - 3.0 %   Neutro Abs 2.2 1.4 - 7.7 K/uL   Lymphs Abs 2.3 0.7 - 4.0 K/uL   Monocytes Absolute 0.5 0.1 - 1.0 K/uL   Eosinophils Absolute 0.1 0.0 - 0.7 K/uL   Basophils Absolute 0.0 0.0 - 0.1 K/uL  Hepatic function panel     Status: None   Collection Time: 05/19/15 12:22 PM  Result Value Ref Range   Total Bilirubin 0.3 0.2 - 1.2 mg/dL   Bilirubin, Direct 0.1 0.0 - 0.3 mg/dL   Alkaline Phosphatase 59 39 - 117 U/L   AST 16 0 - 37 U/L   ALT 17 0 - 35 U/L   Total Protein 7.9 6.0 - 8.3 g/dL   Albumin 4.2 3.5 - 5.2 g/dL  Lipid panel     Status: None   Collection Time: 05/19/15 12:22 PM  Result Value Ref Range   Cholesterol 87 0 - 200 mg/dL    Comment: ATP III Classification       Desirable:  < 200 mg/dL               Borderline High:  200 - 239 mg/dL          High:  > = 240 mg/dL   Triglycerides 134.0 0.0 - 149.0 mg/dL    Comment: Normal:  <150 mg/dLBorderline High:  150 - 199 mg/dL   HDL 49.50 >39.00 mg/dL   VLDL 26.8 0.0 -  40.0 mg/dL   LDL Cholesterol 11 0 - 99 mg/dL   Total CHOL/HDL Ratio 2     Comment:                Men          Women1/2 Average Risk     3.4          3.3Average Risk          5.0          4.42X Average Risk          9.6           7.13X Average Risk          15.0          11.0                       NonHDL 37.93     Comment: NOTE:  Non-HDL goal should be 30 mg/dL higher than patient's LDL goal (i.e. LDL goal of < 70 mg/dL, would have non-HDL goal of < 100 mg/dL)  TSH     Status: None   Collection Time: 05/19/15 12:22 PM  Result Value Ref Range   TSH 3.76 0.35 - 4.50 uIU/mL  Urinalysis, Routine w reflex microscopic (not at [facility])     Status: Abnormal   Collection Time: 05/19/15 12:22 PM  Result Value Ref Range   Color, Urine YELLOW Yellow;Lt. Yellow   APPearance CLEAR Clear   Specific Gravity, Urine 1.015 1.000-1.030   pH 7.0 5.0 - 8.0   Total Protein, Urine NEGATIVE Negative   Urine Glucose NEGATIVE Negative   Ketones, ur NEGATIVE Negative   Bilirubin Urine NEGATIVE Negative   Hgb urine dipstick NEGATIVE Negative   Urobilinogen, UA 0.2 0.0 - 1.0   Leukocytes, UA NEGATIVE Negative   Nitrite NEGATIVE Negative   WBC, UA 0-2/hpf 0-2/hpf   RBC / HPF 0-2/hpf 0-2/hpf   Mucus, UA Presence of (A) None   Squamous Epithelial / LPF Rare(0-4/hpf) Rare(0-4/hpf)    Comment: Rare clue cells present   Bacteria, UA Rare(<10/hpf) (A) None   Yeast, UA Presence of (A) None  Basic Metabolic Panel (BMET)     Status: Abnormal   Collection Time: 05/28/15  4:29 PM  Result Value Ref Range   Sodium 141 135 - 146 mmol/L   Potassium 3.0 (L) 3.5 - 5.3 mmol/L   Chloride 100 98 - 110 mmol/L   CO2 28 20 - 31 mmol/L   Glucose, Bld 122 (H) 65 - 99 mg/dL   BUN 23 7 - 25 mg/dL   Creat 1.01 (H) 0.50 - 0.99 mg/dL   Calcium 9.0 8.6 - 10.4 mg/dL    Assessment/Plan: HTN (hypertension) BP improved. Swelling improved. Will continue current regimen. DASH diet reviewed. Will check BMP today to reassess potassium.

## 2015-06-07 NOTE — Progress Notes (Signed)
Pre visit review using our clinic review tool, if applicable. No additional management support is needed unless otherwise documented below in the visit note. 

## 2015-06-07 NOTE — Patient Instructions (Signed)
Please go to the lab for blood work. I will call with your results. We will alter your regimen based on results. Follow-up with Melissa in 3-4 weeks.  DASH Eating Plan DASH stands for "Dietary Approaches to Stop Hypertension." The DASH eating plan is a healthy eating plan that has been shown to reduce high blood pressure (hypertension). Additional health benefits may include reducing the risk of type 2 diabetes mellitus, heart disease, and stroke. The DASH eating plan may also help with weight loss. WHAT DO I NEED TO KNOW ABOUT THE DASH EATING PLAN? For the DASH eating plan, you will follow these general guidelines:  Choose foods with a percent daily value for sodium of less than 5% (as listed on the food label).  Use salt-free seasonings or herbs instead of table salt or sea salt.  Check with your health care provider or pharmacist before using salt substitutes.  Eat lower-sodium products, often labeled as "lower sodium" or "no salt added."  Eat fresh foods.  Eat more vegetables, fruits, and low-fat dairy products.  Choose whole grains. Look for the word "whole" as the first word in the ingredient list.  Choose fish and skinless chicken or [place] more often than red meat. Limit fish, poultry, and meat to 6 oz (170 g) each day.  Limit sweets, desserts, sugars, and sugary drinks.  Choose heart-healthy fats.  Limit cheese to 1 oz (28 g) per day.  Eat more home-cooked food and less restaurant, buffet, and fast food.  Limit fried foods.  Cook foods using methods other than frying.  Limit canned vegetables. If you do use them, rinse them well to decrease the sodium.  When eating at a restaurant, ask that your food be prepared with less salt, or no salt if possible. WHAT FOODS CAN I EAT? Seek help from a dietitian for individual calorie needs. Grains Whole grain or whole wheat bread. Brown rice. Whole grain or whole wheat pasta. Quinoa, bulgur, and whole grain cereals. Low-sodium  cereals. Corn or whole wheat flour tortillas. Whole grain cornbread. Whole grain crackers. Low-sodium crackers. Vegetables Fresh or frozen vegetables (raw, steamed, roasted, or grilled). Low-sodium or reduced-sodium tomato and vegetable juices. Low-sodium or reduced-sodium tomato sauce and paste. Low-sodium or reduced-sodium canned vegetables.  Fruits All fresh, canned (in natural juice), or frozen fruits. Meat and Other Protein Products Ground beef (85% or leaner), grass-fed beef, or beef trimmed of fat. Skinless chicken or [place]. Ground chicken or [place]. Pork trimmed of fat. All fish and seafood. Eggs. Dried beans, peas, or lentils. Unsalted nuts and seeds. Unsalted canned beans. Dairy Low-fat dairy products, such as skim or 1% milk, 2% or reduced-fat cheeses, low-fat ricotta or cottage cheese, or plain low-fat yogurt. Low-sodium or reduced-sodium cheeses. Fats and Oils Tub margarines without trans fats. Light or reduced-fat mayonnaise and salad dressings (reduced sodium). Avocado. Safflower, olive, or canola oils. Natural peanut or almond butter. Other Unsalted popcorn and pretzels. The items listed above may not be a complete list of recommended foods or beverages. Contact your dietitian for more options. WHAT FOODS ARE NOT RECOMMENDED? Grains White bread. White pasta. White rice. Refined cornbread. Bagels and croissants. Crackers that contain trans fat. Vegetables Creamed or fried vegetables. Vegetables in a cheese sauce. Regular canned vegetables. Regular canned tomato sauce and paste. Regular tomato and vegetable juices. Fruits Dried fruits. Canned fruit in light or heavy syrup. Fruit juice. Meat and Other Protein Products Fatty cuts of meat. Ribs, chicken wings, bacon, sausage, bologna, salami, chitterlings, fatback, hot  dogs, bratwurst, and packaged luncheon meats. Salted nuts and seeds. Canned beans with salt. Dairy Whole or 2% milk, cream, half-and-half, and cream cheese.  Whole-fat or sweetened yogurt. Full-fat cheeses or blue cheese. Nondairy creamers and whipped toppings. Processed cheese, cheese spreads, or cheese curds. Condiments Onion and garlic salt, seasoned salt, table salt, and sea salt. Canned and packaged gravies. Worcestershire sauce. Tartar sauce. Barbecue sauce. Teriyaki sauce. Soy sauce, including reduced sodium. Steak sauce. Fish sauce. Oyster sauce. Cocktail sauce. Horseradish. Ketchup and mustard. Meat flavorings and tenderizers. Bouillon cubes. Hot sauce. Tabasco sauce. Marinades. Taco seasonings. Relishes. Fats and Oils Butter, stick margarine, lard, shortening, ghee, and bacon fat. Coconut, palm kernel, or palm oils. Regular salad dressings. Other Pickles and olives. Salted popcorn and pretzels. The items listed above may not be a complete list of foods and beverages to avoid. Contact your dietitian for more information. WHERE CAN I FIND MORE INFORMATION? National Heart, Lung, and Blood Institute: travelstabloid.com   This information is not intended to replace advice given to you by your health care provider. Make sure you discuss any questions you have with your health care provider.   Document Released: 02/02/2011 Document Revised: 03/06/2014 Document Reviewed: 12/18/2012 Elsevier Interactive Patient Education [employer].

## 2015-06-15 ENCOUNTER — Encounter: Payer: Self-pay | Admitting: Family

## 2015-06-15 MED ORDER — SPIRONOLACTONE 50 MG PO TABS: 50.0000 mg | ORAL_TABLET | Freq: Two times a day (BID) | ORAL | Status: DC

## 2015-06-18 ENCOUNTER — Encounter: Payer: Self-pay | Admitting: Family

## 2015-06-18 NOTE — Telephone Encounter (Signed)
Spoke with Morey Hummingbird at [facility] and requested MRI. Awaiting result to nurse station fax.

## 2015-06-18 NOTE — Telephone Encounter (Signed)
MRI result received and placed in PCP yellow folder for review.  Please advise.

## 2015-06-23 NOTE — Telephone Encounter (Signed)
Result faxed.

## 2015-06-23 NOTE — Telephone Encounter (Signed)
Reviewed MRI C spine results which were ordered by Dr. Hal Neer.  Notes progressive myelomalacia R side of cord C4-5.  + spinal stenosis, central disc protrution C2-3, mod to sever R and left foraminal stenosis at C4-5.  C5-6.  Mod R and mild left foraminal stenosis C6-7.  Reviewed results with patient. She is still waiting to hear back from Dr. Sande Rives office.  Advised pt of results and that I will defer management to neurosurgery since they ordered test and are managing her C-spine disease. She reports that she is continuing to have daily neck pain.  In the meantime,  Gilmore Laroche could you please fax a copy of the results to dr. Sande Rives attention.  thanks

## 2015-06-30 ENCOUNTER — Encounter: Payer: Self-pay | Admitting: Cardiology

## 2015-06-30 ENCOUNTER — Telehealth: Payer: Self-pay | Admitting: Cardiology

## 2015-06-30 ENCOUNTER — Ambulatory Visit (INDEPENDENT_AMBULATORY_CARE_PROVIDER_SITE_OTHER): Payer: BLUE CROSS/BLUE SHIELD | Admitting: Cardiology

## 2015-06-30 VITALS — BP 100/50 | HR 56 | Ht 63.0 in | Wt 178.2 lb

## 2015-06-30 DIAGNOSIS — I10 Essential (primary) hypertension: Secondary | ICD-10-CM | POA: Diagnosis not present

## 2015-06-30 DIAGNOSIS — R079 Chest pain, unspecified: Secondary | ICD-10-CM | POA: Diagnosis not present

## 2015-06-30 DIAGNOSIS — R002 Palpitations: Secondary | ICD-10-CM | POA: Diagnosis not present

## 2015-06-30 NOTE — Progress Notes (Signed)
Cardiology Office Note    Date:  06/30/2015   ID:  Cassidy Harrell, DOB 01-24-1951, MRN [id number]  PCP:  [name]., NP  Cardiologist:  Sueanne Margarita, MD   Chief Complaint  Patient presents with  . Chest Pain  . Palpitations    History of Present Illness:  Cassidy Harrell is a 65 y.o. female with a history of HTN, irregular heart beat and heart murmur who presents today for evaluation of chest pain.  She says that the CP has been occurring for 3-4 months.  She describes it as a sharp pain that is located to the left of her sternum.  It usually lasts a few seconds and resolved.  She denies any diaphoresis, SOB or nausea with the pain.  She does notice some DOE which is chronic.  She occasionally has some ankle edema.  She used to smoke and quit in 2011 but smoked for 40 years.  She has a brother who died of SCD at age 76 while playing baseketball.  He had a history of DCM.  She has also noticed some episodes of fluttering in her chest.     Past Medical History  Diagnosis Date  . Hypertension   . Phlebitis   . Irregular heart beat   . Arthritis   . Depression   . Personal history of colonic polyps   . Heart murmur   . History of syphilis   . Gum disease   . Pre-diabetes     Past Surgical History  Procedure Laterality Date  . Therapeutic abortion  [place]    Current Medications: Outpatient Prescriptions Prior to Visit  Medication Sig Dispense Refill  . aspirin 81 MG chewable tablet Chew 81 mg by mouth daily.      . Calcium Carbonate-Vitamin D (CALTRATE 600+D) 600-400 MG-UNIT per tablet Take 1 tablet by mouth 2 (two) times daily.    Marland Kitchen diltiazem (CARTIA XT) 180 MG 24 hr capsule Take 1 capsule (180 mg total) by mouth daily. 30 capsule 5  . Ferrous Gluconate (IRON) 240 (27 FE) MG TABS Take 1 tablet by mouth daily.      Marland Kitchen lisinopril (PRINIVIL,ZESTRIL) 20 MG tablet Take 1 tablet (20 mg total) by mouth daily. 30 tablet 3  . metoprolol tartrate  (LOPRESSOR) 25 MG tablet Take 1 tablet (25 mg total) by mouth daily. 30 tablet 5  . spironolactone (ALDACTONE) 50 MG tablet Take 1 tablet (50 mg total) by mouth 2 (two) times daily. 60 tablet 5   No facility-administered medications prior to visit.     Allergies:   Review of patient's allergies indicates no known allergies.   Social History   Social History  . Marital Status: Divorced    Spouse Name: N/A  . Number of Children: 1  . Years of Education: N/A   Social History Main Topics  . Smoking status: Former Smoker    Types: Cigarettes  . Smokeless tobacco: Never Used  . Alcohol Use: No  . Drug Use: No  . Sexual Activity: Not Asked   Other Topics Concern  . 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 There is no history of Colon cancer or Stomach cancer.   ROS:   Please see the history of present illness.    Review of Systems  Respiratory: Positive for cough, shortness of breath and snoring.   Musculoskeletal: Positive for back pain and joint swelling.  Neurological: Positive for dizziness and loss of balance.   All other systems reviewed and are negative.   PHYSICAL EXAM:   VS:  BP 100/50 mmHg  Pulse 56  Ht 5\' 3"  (1.6 m)  Wt 178 lb 3.2 oz (80.831 kg)  BMI 31.57 kg/m2   GEN: Well nourished, well developed, in no acute distress HEENT: normal Neck: no JVD, carotid bruits, or masses Cardiac: RRR; no murmurs,  rubs, or gallops,no edema.  Intact distal pulses bilaterally.  Respiratory:  clear to auscultation bilaterally, normal work of breathing GI: soft, nontender, nondistended, + BS MS: no deformity or atrophy Skin: warm and dry, no rash Neuro:  Alert and Oriented x 3, Strength and sensation are intact Psych: euthymic mood, full affect  Wt Readings from Last 3 Encounters:  06/30/15 178 lb 3.2 oz (80.831 kg)  06/07/15 180 lb 9.6 oz (81.92 kg)  05/19/15 182 lb 6.4 oz (82.736 kg)      Studies/Labs Reviewed:   EKG:  EKG is not ordered today.  Recent Labs: 05/19/2015: ALT 17; Hemoglobin 12.8; Platelets 177.0; TSH 3.76 06/07/2015: BUN 12; Creatinine, Ser 0.92; Potassium 4.2; Sodium 139   Lipid Panel    Component Value Date/Time   CHOL 87 05/19/2015 1222   TRIG 134.0 05/19/2015 1222   HDL 49.50 05/19/2015 1222   CHOLHDL 2 05/19/2015 1222   VLDL 26.8 05/19/2015 1222   LDLCALC 11 05/19/2015 1222    Additional studies/ records that were reviewed today include:  Office visit notes from PCP    ASSESSMENT:    1. Chest pain, unspecified chest pain type   2. Heart palpitations   3. Essential hypertension      PLAN:  In order of problems listed above:  1. Chest pain that sounds atypical in that it is sharp and only lasts a few minutes at a time but she does have CRFs including pre diabetes, remote tobacco use for more than 40 years, family history of heart disease at an early age and EKG showing anteroseptal infarct with no ST changes.  I will get a Stress myoview to rule out ischemia. Given the family history of DCM at a young age with history of SCD, I will get an echo to assess LVF.   2. Palpitations - I will get a 30 day heart monitor to assess for arrhythmias 3. HTN - BP controlled.  Continue Cardizem/BB/ACE I/Aldactone    Medication Adjustments/Labs and Tests Ordered: Current medicines are reviewed at length with the patient today.  Concerns regarding medicines are outlined  above.  Medication changes, Labs and Tests ordered today are listed in the Patient Instructions below.   Lurena Nida, MD  06/30/2015 9:03 AM    [facility] [address], [place], [place]  [address] Phone: [phone]; Fax: [phone]

## 2015-06-30 NOTE — Telephone Encounter (Signed)
New Message:  Pt was calling in wanting some clarity as to why was not able to receive her heart monitor today. Please f/u with her

## 2015-06-30 NOTE — Telephone Encounter (Signed)
Left message to call back  

## 2015-06-30 NOTE — Patient Instructions (Signed)
Medication Instructions:  Your physician recommends that you continue on your current medications as directed. Please refer to the Current Medication list given to you today.   Labwork: None  Testing/Procedures: Your physician has requested that you have an echocardiogram. Echocardiography is a painless test that uses sound waves to create images of your heart. It provides your doctor with information about the size and shape of your heart and how well your heart's chambers and valves are working. This procedure takes approximately one hour. There are no restrictions for this procedure.   Dr. Radford Pax recommends you have an [place].  Your physician has recommended that you wear an event monitor. Event monitors are medical devices that record the heart's electrical activity. Doctors most often [place] these monitors to diagnose arrhythmias. Arrhythmias are problems with the speed or rhythm of the heartbeat. The monitor is a small, portable device. You can wear one while you do your normal daily activities. This is usually used to diagnose what is causing palpitations/syncope (passing out).  Follow-Up: Your physician recommends that you schedule a follow-up appointment AS NEEDED with Dr. Radford Pax pending study results.  Any Other Special Instructions Will Be Listed Below (If Applicable).     If you need a refill on your cardiac medications before your next appointment, please call your pharmacy.

## 2015-06-30 NOTE — Telephone Encounter (Signed)
Informed patient she was not scheduled for a monitor because there were none available.  Confirmed she scheduled appointment for monitor 5/16. Patient was grateful for call.

## 2015-07-05 ENCOUNTER — Encounter: Payer: Self-pay | Admitting: Family

## 2015-07-05 ENCOUNTER — Ambulatory Visit (INDEPENDENT_AMBULATORY_CARE_PROVIDER_SITE_OTHER): Payer: BLUE CROSS/BLUE SHIELD | Admitting: Family

## 2015-07-05 VITALS — BP 106/51 | HR 75 | Temp 97.7°F | Resp 16 | Ht 63.0 in | Wt 179.8 lb

## 2015-07-05 DIAGNOSIS — L299 Pruritus, unspecified: Secondary | ICD-10-CM | POA: Diagnosis not present

## 2015-07-05 DIAGNOSIS — L298 Other pruritus: Secondary | ICD-10-CM

## 2015-07-05 DIAGNOSIS — T50905A Adverse effect of unspecified drugs, medicaments and biological substances, initial encounter: Principal | ICD-10-CM

## 2015-07-05 NOTE — Progress Notes (Signed)
Subjective:    Patient ID: JAXX AUD, female    DOB: Apr 26, 1950, 65 y.o.   MRN: [id number]  HPI  Cassidy Harrell is a 65 yr old female who presents today with c/o itching on the bottom of her feet since last Sunday.  Itching is also present on hands/feet/lower abdomen and lower back.  Reports that she stopped spironolactone on Saturday. Reports itching has worsened. Denies SOB, tongue/lip swelling/hives.    Review of Systems Past Medical History  Diagnosis Date  . Hypertension   . Phlebitis   . Irregular heart beat   . Arthritis   . Depression   . Personal history of colonic polyps   . Heart murmur   . History of syphilis   . Gum disease   . Pre-diabetes      Social History   Social History  . Marital Status: Divorced    Spouse Name: N/A  . Number of Children: 1  . Years of Education: N/A   Occupational History  . Not on file.   Social History Main Topics  . Smoking status: Former Smoker    Types: Cigarettes  . Smokeless tobacco: Never Used  . Alcohol Use: No  . Drug Use: No  . Sexual Activity: Not on file   Other Topics Concern  . 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 Therapeutic abortion  [place]    Family History  Problem Relation Age of Onset  . Diabetes Mother   . Cancer Father     lung cancer  . Kidney disease Father   . Seizures Father   . Cancer Brother     colon  . Heart attack Brother   . Stroke Brother   . Colon cancer Neg Hx   . Stomach cancer Neg Hx     No Known Allergies  Current Outpatient Prescriptions on  File Prior to Visit  Medication Sig Dispense Refill  . aspirin 81 MG chewable tablet Chew 81 mg by mouth daily.      . Calcium Carbonate-Vitamin D (CALTRATE 600+D) 600-400 MG-UNIT per tablet Take 1 tablet by mouth 2 (two) times daily.    Marland Kitchen diltiazem (CARTIA XT) 180 MG 24 hr capsule Take 1 capsule (180 mg total) by mouth daily. 30 capsule 5  . Ferrous Gluconate (IRON) 240 (27 FE) MG TABS Take 1 tablet by mouth daily.      Marland Kitchen lisinopril (PRINIVIL,ZESTRIL) 20 MG tablet Take 1 tablet (20 mg total) by mouth daily. 30 tablet 3  . metoprolol tartrate (LOPRESSOR) 25 MG tablet Take 1 tablet (25 mg total) by mouth daily. 30 tablet 5  . spironolactone (ALDACTONE) 50 MG tablet Take 1 tablet (50 mg total) by mouth 2 (two) times daily. (Patient not taking: Reported on 07/05/2015) 60 tablet 5   No current facility-administered medications on file prior to visit.    BP 106/51 mmHg  Pulse 75  Temp(Src) 97.7 F (36.5 C) (Oral)  Resp 16  Ht 5\' 3"  (1.6 m)  Wt 179 lb 12.8 oz (81.557 kg)  BMI 31.86 kg/m2  SpO2 99%       Objective:   Physical Exam  Constitutional: She appears well-developed and well-nourished.  Cardiovascular: Normal rate, regular rhythm and normal heart sounds.   No murmur heard. Pulmonary/Chest: Effort normal and breath sounds normal. No respiratory distress. She has no wheezes.  Musculoskeletal:  1+ bilateral LE edema  Skin:  No rash or hives noted.   Psychiatric: She has a normal mood and affect. Her behavior is normal. Judgment and thought content normal.          Assessment & Plan:  Itching due to drug- ? If related to aldactone, bp and edema appear stable.  Monitor off of aldactone. Advised trial of zyrtec and sarna anti-itch. Pt is advised as follows:   Go to the ER if you develop tongue/lip swelling. Call me if you develop rash, if itching worsens or if if itching does not improve.  Remain off of aldactone. Call if your swelling worsens off of the aldactone.

## 2015-07-05 NOTE — Addendum Note (Signed)
Addended by: [name] on: 07/05/2015 05:04 PM   Modules accepted: Orders

## 2015-07-05 NOTE — Telephone Encounter (Signed)
Please ask pt which medication she thinks is to blame. thanks

## 2015-07-05 NOTE — Progress Notes (Signed)
Pre visit review using our clinic review tool, if applicable. No additional management support is needed unless otherwise documented below in the visit note. 

## 2015-07-05 NOTE — Patient Instructions (Addendum)
Add zyrtec once daily as needed for itching. You can also try using sarna anti-itch lotion which is available over the counter. Go to the ER if you develop tongue/lip swelling. Call me if you develop rash, if itching worsens or if if itching does not improve.  Remain off of aldactone. Call if your swelling worsens off of the aldactone.

## 2015-07-08 ENCOUNTER — Telehealth (HOSPITAL_COMMUNITY): Payer: Self-pay | Admitting: *Deleted

## 2015-07-08 NOTE — Telephone Encounter (Signed)
Left message on voicemail in reference to upcoming appointment scheduled for 07/13/15. Phone number given for a call back so details instructions can be given. Janira Mandell, Ranae Palms

## 2015-07-09 ENCOUNTER — Telehealth (HOSPITAL_COMMUNITY): Payer: Self-pay | Admitting: *Deleted

## 2015-07-09 NOTE — Telephone Encounter (Signed)
Patient given detailed instructions per Myocardial Perfusion Study Information Sheet for the test on 07/13/15 at 7:15. Patient notified to arrive 15 minutes early and that it is imperative to arrive on time for appointment to keep from having the test rescheduled.  If you need to cancel or reschedule your appointment, please call the office within 24 hours of your appointment. Failure to do so may result in a cancellation of your appointment, and a $50 no show fee. Patient verbalized understanding.Veronia Beets

## 2015-07-13 ENCOUNTER — Ambulatory Visit (HOSPITAL_COMMUNITY): Payer: BLUE CROSS/BLUE SHIELD | Attending: Cardiology

## 2015-07-13 ENCOUNTER — Ambulatory Visit (INDEPENDENT_AMBULATORY_CARE_PROVIDER_SITE_OTHER): Payer: BLUE CROSS/BLUE SHIELD

## 2015-07-13 ENCOUNTER — Other Ambulatory Visit: Payer: Self-pay

## 2015-07-13 ENCOUNTER — Ambulatory Visit (HOSPITAL_BASED_OUTPATIENT_CLINIC_OR_DEPARTMENT_OTHER): Payer: BLUE CROSS/BLUE SHIELD

## 2015-07-13 ENCOUNTER — Telehealth: Payer: Self-pay | Admitting: Cardiology

## 2015-07-13 DIAGNOSIS — R079 Chest pain, unspecified: Secondary | ICD-10-CM

## 2015-07-13 DIAGNOSIS — I351 Nonrheumatic aortic (valve) insufficiency: Secondary | ICD-10-CM | POA: Insufficient documentation

## 2015-07-13 DIAGNOSIS — E119 Type 2 diabetes mellitus without complications: Secondary | ICD-10-CM | POA: Insufficient documentation

## 2015-07-13 DIAGNOSIS — I119 Hypertensive heart disease without heart failure: Secondary | ICD-10-CM | POA: Insufficient documentation

## 2015-07-13 DIAGNOSIS — R002 Palpitations: Secondary | ICD-10-CM

## 2015-07-13 DIAGNOSIS — R0609 Other forms of dyspnea: Secondary | ICD-10-CM | POA: Insufficient documentation

## 2015-07-13 DIAGNOSIS — Z87891 Personal history of nicotine dependence: Secondary | ICD-10-CM | POA: Diagnosis not present

## 2015-07-13 DIAGNOSIS — I340 Nonrheumatic mitral (valve) insufficiency: Secondary | ICD-10-CM | POA: Diagnosis not present

## 2015-07-13 LAB — MYOCARDIAL PERFUSION IMAGING
CHL CUP NUCLEAR SDS: 0
CHL CUP NUCLEAR SRS: 3
CHL RATE OF PERCEIVED EXERTION: 18
CSEPEW: 6.5 METS
Exercise duration (min): 4 min
Exercise duration (sec): 36 s
LHR: 0.34
LV sys vol: 35 mL
LVDIAVOL: 80 mL (ref 46–106)
MPHR: 156 {beats}/min
NUC STRESS TID: 0.94
Peak HR: 142 {beats}/min
Percent HR: 91 %
Rest HR: 59 {beats}/min
SSS: 3

## 2015-07-13 MED ORDER — AMLODIPINE BESYLATE 5 MG PO TABS: 5.0000 mg | ORAL_TABLET | Freq: Every day | ORAL | Status: DC

## 2015-07-13 MED ORDER — TECHNETIUM TC 99M TETROFOSMIN IV KIT
32.9000 | PACK | Freq: Once | INTRAVENOUS | Status: AC | PRN
  Administered 2015-07-13: 32.9 via INTRAVENOUS
  Filled 2015-07-13: qty 33

## 2015-07-13 MED ORDER — TECHNETIUM TC 99M TETROFOSMIN IV KIT
10.4000 | PACK | Freq: Once | INTRAVENOUS | Status: AC | PRN
  Administered 2015-07-13: 10 via INTRAVENOUS
  Filled 2015-07-13: qty 10

## 2015-07-13 NOTE — Telephone Encounter (Deleted)
-----   Message from Sueanne Margarita, MD sent at 07/13/2015  2:15 PM EDT ----- Please let patient know that stress test was fine

## 2015-07-13 NOTE — Telephone Encounter (Signed)
Cassidy Margarita, MD   Sent: Tue Jul 13, 2015 2:15 PM    To: Theodoro Parma, RN    Cc: [name], NP        Result Note     Please let patient know that stress test was fine    Informed patient of results and verbal understanding expressed. Instructed patient to STOP METOPROLOL and CARTIA. Instructed patient to START AMLODIPINE 5 mg daily. Scheduled patient 5/24 in the HTN Clinic. Patient agrees with treatment plan.

## 2015-07-13 NOTE — Telephone Encounter (Signed)
Patient presented today for stress myoview and was noted to be in Type I second degree AV block.  She is currently wearing a heart monitor.  She is on Cartia 180mg  daily and metoprolol tartrate 25mg  daily.  Please have her stop metoprolol and Cartia and start amlodipine 5mg  daily.  Have her seen in HTN clinic in 1 week.

## 2015-07-13 NOTE — Addendum Note (Signed)
Addended by: Harland German A on: 07/13/2015 06:02 PM   Modules accepted: Orders, Medications

## 2015-07-14 NOTE — Telephone Encounter (Signed)
This encounter was created in error - please disregard.

## 2015-07-15 ENCOUNTER — Encounter: Payer: Self-pay | Admitting: Family

## 2015-07-20 NOTE — Telephone Encounter (Signed)
Fax received and forwarded to PCP's yellow folder for review.

## 2015-07-20 NOTE — Telephone Encounter (Signed)
Called Dr Clarice Pole office and requested office notes / results be faxed to nurse station. Awaiting reports.

## 2015-07-21 ENCOUNTER — Encounter: Payer: Self-pay | Admitting: Pharmacist

## 2015-07-21 ENCOUNTER — Ambulatory Visit (INDEPENDENT_AMBULATORY_CARE_PROVIDER_SITE_OTHER): Payer: BLUE CROSS/BLUE SHIELD | Admitting: Pharmacist

## 2015-07-21 VITALS — BP 104/82 | HR 87

## 2015-07-21 DIAGNOSIS — I10 Essential (primary) hypertension: Secondary | ICD-10-CM | POA: Diagnosis not present

## 2015-07-21 NOTE — Progress Notes (Signed)
Patient ID: FEIGE KRUTZ                 DOB: [birth year]                      MRN: [id number]     HPI: Cassidy Harrell is a 65 y.o. female referred by Dr. Radford Pax to HTN clinic. PMH is significant for HTN, DM, and depression. Patient had a stress myoview on 07/13/15 and was noted to be in Type 1 2nd degree AV block. Diltiazem and metoprolol were discontinued at that time and pt was initiated on amlodipine 5mg  daily. Still continues to take lisinopril daily. Pt presents today for 1 week HTN follow up.  Patient reports that she has been tolerating her medications well. She denies symptoms of orthostatic hypotension, dizziness, blurred vision, or headache. She takes her medications in the afternoon and has not yet taken her HTN meds.  Current HTN meds: amlodipine 5mg  daily, lisinopril 20mg  daily Previously tried: diltiazem, metoprolol - discontinued due to 2nd degree AV block BP goal: < 140/14mmHg  Family History: Brother died at age 16 from [place] while playing basketball.  Social History: Used to smoke for 40 years, quit in 2011. Pt reports that she does not drink alcohol or use illicit drugs.    Wt Readings from Last 3 Encounters:  07/05/15 179 lb 12.8 oz (81.557 kg)  06/30/15 178 lb 3.2 oz (80.831 kg)  06/07/15 180 lb 9.6 oz (81.92 kg)   BP Readings from Last 3 Encounters:  07/05/15 106/51  06/30/15 100/50  06/07/15 110/58   Pulse Readings from Last 3 Encounters:  07/05/15 75  06/30/15 56  06/07/15 77    Renal function: CrCl cannot be calculated (Unknown ideal weight.).  Past Medical History  Diagnosis Date  . Hypertension   . Phlebitis   . Irregular heart beat   . Arthritis   . Depression   . Personal history of colonic polyps   . Heart murmur   . History of syphilis   . Gum disease   . Pre-diabetes     Current Outpatient Prescriptions on File Prior to Visit  Medication Sig Dispense Refill  . amLODipine (NORVASC) 5 MG tablet Take 1 tablet (5 mg total) by mouth  daily. 30 tablet 11  . aspirin 81 MG chewable tablet Chew 81 mg by mouth daily.      . Calcium Carbonate-Vitamin D (CALTRATE 600+D) 600-400 MG-UNIT per tablet Take 1 tablet by mouth 2 (two) times daily.    . Ferrous Gluconate (IRON) 240 (27 FE) MG TABS Take 1 tablet by mouth daily.      Marland Kitchen lisinopril (PRINIVIL,ZESTRIL) 20 MG tablet Take 1 tablet (20 mg total) by mouth daily. 30 tablet 3   No current facility-administered medications on file prior to visit.    Allergies  Allergen Reactions  . Aldactone [Spironolactone] Other (See Comments)    itching     Assessment/Plan:  1. Hypertension -  BP low at 104/18mmHg today since starting amlodipine last week. She had not taken her HTN meds yet today either. Will discontinue amlodipine, pt to continue on just lisinopril 20mg  daily for BP control. She has f/u with Dr. Radford Pax within the next month before her surgery and BP will be reassessed at that time. Given history of well controlled BP readings, expect that lisinopril monotherapy will sufficient to control BP.   Miran Kautzman E. Kingslee Dowse, PharmD, [place] [id number]  [place], [place] [address] Phone: [phone]; Fax: [phone] 07/21/2015 8:55 AM

## 2015-07-21 NOTE — Patient Instructions (Signed)
Stop taking your amlodipine  Continue taking all your other medications as directed   Make a follow up appointment with Dr. Radford Pax so you can be cleared for surgery in June  Please call [phone] if you have any questions or concerns with your medications

## 2015-08-02 ENCOUNTER — Encounter: Payer: Self-pay | Admitting: Family

## 2015-08-19 ENCOUNTER — Telehealth: Payer: Self-pay

## 2015-08-19 NOTE — Telephone Encounter (Signed)
Received message from Medical Records this morning that the patient is having neck surgery tomorrow at [facility] and the anesthesiologist needs to see the results of the monitor the patient wore. Called Shelly, monitor technician, and had her import the monitor tracings into [employer]. [facility] and informed her that Dr. Radford Pax is out of the [place] and will not review the monitor results prior to surgery. Kayte requested the monitor tracings faxed for anesthesiology to review.  Reiterated to her that they have not been resulted/reviewed by Cardiology.  Confirmed with her cardiac clearance is not needed. She was grateful for assistance.

## 2015-08-20 HISTORY — PX: SPINE SURGERY: SHX786

## 2015-10-10 ENCOUNTER — Other Ambulatory Visit: Payer: Self-pay | Admitting: Family

## 2015-10-10 ENCOUNTER — Encounter: Payer: Self-pay | Admitting: Family

## 2015-10-11 MED ORDER — LISINOPRIL 20 MG PO TABS: 20.0000 mg | ORAL_TABLET | Freq: Every day | ORAL | 0 refills | Status: DC

## 2015-10-15 ENCOUNTER — Ambulatory Visit (INDEPENDENT_AMBULATORY_CARE_PROVIDER_SITE_OTHER): Payer: BLUE CROSS/BLUE SHIELD | Admitting: Family

## 2015-10-15 ENCOUNTER — Encounter: Payer: Self-pay | Admitting: Family

## 2015-10-15 VITALS — BP 138/70 | HR 82 | Temp 97.7°F | Resp 18 | Ht 63.0 in | Wt 177.8 lb

## 2015-10-15 DIAGNOSIS — I10 Essential (primary) hypertension: Secondary | ICD-10-CM

## 2015-10-15 DIAGNOSIS — M5090 Cervical disc disorder, unspecified, unspecified cervical region: Secondary | ICD-10-CM

## 2015-10-15 DIAGNOSIS — E119 Type 2 diabetes mellitus without complications: Secondary | ICD-10-CM

## 2015-10-15 LAB — BASIC METABOLIC PANEL
BUN: 10 mg/dL (ref 6–23)
CALCIUM: 9.3 mg/dL (ref 8.4–10.5)
CO2: 25 mEq/L (ref 19–32)
CREATININE: 0.83 mg/dL (ref 0.40–1.20)
Chloride: 108 mEq/L (ref 96–112)
GFR: 88.83 mL/min (ref >60.00)
Glucose, Bld: 91 mg/dL (ref 70–99)
Potassium: 3.6 mEq/L (ref 3.5–5.1)
SODIUM: 142 meq/L (ref 135–145)

## 2015-10-15 LAB — HEMOGLOBIN A1C: HEMOGLOBIN A1C: 5.9 % (ref 4.6–6.5)

## 2015-10-15 MED ORDER — LISINOPRIL 20 MG PO TABS: 20.0000 mg | ORAL_TABLET | Freq: Every day | ORAL | 1 refills | Status: DC

## 2015-10-15 NOTE — Progress Notes (Signed)
Subjective:    Patient ID: Cassidy Harrell, female    DOB: [birth year], 65 y.o.   MRN: [id number]  HPI  Ms. Raboin is a 65 yr old female who presents today for follow up.   HTN-  Maintained on lisinopril.   BP Readings from Last 3 Encounters:  10/15/15 138/70  07/21/15 104/82  07/05/15 (!) 106/51   DM2- on diabetic diet only.  Lab Results  Component Value Date   HGBA1C 6.0 09/16/2014   HGBA1C 6.4 (H) 03/06/2014   HGBA1C 6.5 11/24/2013   Lab Results  Component Value Date   MICROALBUR 0.7 09/16/2014   LDLCALC 11 05/19/2015   CREATININE 0.92 06/07/2015     Pt had recent cervical disc surgery and is about to return to work after a 2 month leave of absence. Reports neck is feeling well. Still has some nerve damage in her right arm which is unchanged.   Review of Systems  Respiratory: Negative for shortness of breath.   Cardiovascular:       Swelling occurs if she eats too much sodium     Past Medical History:  Diagnosis Date  . Arthritis   . Depression   . Gum disease   . Heart murmur   . History of syphilis   . Hypertension   . Irregular heart beat   . Personal history of colonic polyps   . Phlebitis   . Pre-diabetes      Social History   Social History  . Marital status: Divorced    Spouse name: N/A  . Number of children: 1  . Years of education: N/A   Occupational History  . Not on file.   Social History Main Topics  . Smoking status: Former Smoker    Types: Cigarettes  . Smokeless tobacco: Never Used  . Alcohol use No  . Drug use: No  . Sexual activity: Not on file   Other Topics Concern  . 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 SPINE SURGERY  08/20/2015   bulging / herniated disc in C-spine  . THERAPEUTIC ABORTION  1970. 1974, 1978    Family History  Problem Relation Age of Onset  . Diabetes Mother   . Cancer Father     lung cancer  . Kidney disease Father   . Seizures Father   . Cancer Brother     colon  . Heart attack Brother   . Stroke Brother   . Colon cancer Neg Hx   . Stomach cancer Neg Hx     Allergies  Allergen Reactions  . Aldactone [Spironolactone] Other (See Comments)    itching    Current Outpatient Prescriptions on File Prior to Visit  Medication Sig Dispense Refill  . aspirin 81 MG chewable tablet Chew 81 mg by mouth daily.      . Calcium Carbonate-Vitamin D (CALTRATE 600+D) 600-400 MG-UNIT per tablet Take 1 tablet by mouth 2 (two) times daily.    Marland Kitchen  Ferrous Gluconate (IRON) 240 (27 FE) MG TABS Take 1 tablet by mouth daily.       No current facility-administered medications on file prior to visit.     BP 138/70 (BP Location: Right Arm, Cuff Size: Normal)   Pulse 82   Temp 97.7 F (36.5 C) (Oral)   Resp 18   Ht 5\' 3"  (1.6 m)   Wt 177 lb 12.8 oz (80.6 kg)   SpO2 98% Comment: room air  BMI 31.50 kg/m       Objective:   Physical Exam  Constitutional: She appears well-developed and well-nourished.  Cardiovascular: Normal rate, regular rhythm and normal heart sounds.   No murmur heard. Pulmonary/Chest: Effort normal and breath sounds normal. No respiratory distress. She has no wheezes.  Musculoskeletal:  1+ bilateral LE edema  Psychiatric: She has a normal mood and affect. Her behavior is normal. Judgment and thought content normal.          Assessment & Plan:

## 2015-10-15 NOTE — Assessment & Plan Note (Signed)
BP stable on lisinopril, continue same. Obtain follow up bmet.

## 2015-10-15 NOTE — Progress Notes (Signed)
Pre visit review using our clinic review tool, if applicable. No additional management support is needed unless otherwise documented below in the visit note. 

## 2015-10-15 NOTE — Assessment & Plan Note (Signed)
Obtain follow up a1c.  

## 2015-10-15 NOTE — Assessment & Plan Note (Signed)
Clinically stable following surgery.

## 2015-10-15 NOTE — Patient Instructions (Signed)
Please complete lab work prior to leaving.   

## 2015-10-17 ENCOUNTER — Encounter: Payer: Self-pay | Admitting: Family

## 2015-11-08 ENCOUNTER — Encounter: Payer: Self-pay | Admitting: Family

## 2015-11-24 ENCOUNTER — Other Ambulatory Visit: Payer: Self-pay | Admitting: Neurological Surgery

## 2015-11-25 ENCOUNTER — Other Ambulatory Visit: Payer: Self-pay | Admitting: Neurological Surgery

## 2015-11-25 DIAGNOSIS — M12811 Other specific arthropathies, not elsewhere classified, right shoulder: Secondary | ICD-10-CM

## 2015-11-25 DIAGNOSIS — M75101 Unspecified rotator cuff tear or rupture of right shoulder, not specified as traumatic: Principal | ICD-10-CM

## 2015-11-26 ENCOUNTER — Other Ambulatory Visit: Payer: Self-pay | Admitting: Neurological Surgery

## 2015-11-26 DIAGNOSIS — M12811 Other specific arthropathies, not elsewhere classified, right shoulder: Secondary | ICD-10-CM

## 2015-11-26 DIAGNOSIS — M75101 Unspecified rotator cuff tear or rupture of right shoulder, not specified as traumatic: Principal | ICD-10-CM

## 2015-12-06 ENCOUNTER — Ambulatory Visit
Admission: RE | Admit: 2015-12-06 | Discharge: 2015-12-06 | Disposition: A | Discharge status: Home / Self Care | Payer: BLUE CROSS/BLUE SHIELD | Source: Ambulatory Visit | Attending: Neurological Surgery | Admitting: Neurological Surgery

## 2015-12-06 ENCOUNTER — Other Ambulatory Visit: Payer: Self-pay | Admitting: Neurological Surgery

## 2015-12-06 DIAGNOSIS — M12811 Other specific arthropathies, not elsewhere classified, right shoulder: Secondary | ICD-10-CM

## 2015-12-06 DIAGNOSIS — M75101 Unspecified rotator cuff tear or rupture of right shoulder, not specified as traumatic: Principal | ICD-10-CM

## 2015-12-29 HISTORY — PX: SHOULDER ARTHROSCOPY: SHX128

## 2016-02-16 DIAGNOSIS — Z4789 Encounter for other orthopedic aftercare: Secondary | ICD-10-CM | POA: Diagnosis not present

## 2016-02-23 ENCOUNTER — Encounter: Payer: Self-pay | Admitting: Family

## 2016-03-01 ENCOUNTER — Encounter: Payer: Self-pay | Admitting: Family

## 2016-03-01 ENCOUNTER — Ambulatory Visit (INDEPENDENT_AMBULATORY_CARE_PROVIDER_SITE_OTHER): Payer: Medicare Other | Admitting: Family

## 2016-03-01 VITALS — BP 158/82 | HR 81 | Temp 97.9°F | Resp 18 | Ht 63.0 in | Wt 185.2 lb

## 2016-03-01 DIAGNOSIS — L729 Follicular cyst of the skin and subcutaneous tissue, unspecified: Secondary | ICD-10-CM

## 2016-03-01 DIAGNOSIS — L02219 Cutaneous abscess of trunk, unspecified: Secondary | ICD-10-CM | POA: Diagnosis not present

## 2016-03-01 DIAGNOSIS — L03319 Cellulitis of trunk, unspecified: Secondary | ICD-10-CM

## 2016-03-01 DIAGNOSIS — I10 Essential (primary) hypertension: Secondary | ICD-10-CM | POA: Diagnosis not present

## 2016-03-01 MED ORDER — LISINOPRIL 40 MG PO TABS: 40.0000 mg | ORAL_TABLET | Freq: Every day | ORAL | 3 refills | Status: DC

## 2016-03-01 MED ORDER — CEPHALEXIN 500 MG PO CAPS: 500.0000 mg | ORAL_CAPSULE | Freq: Three times a day (TID) | ORAL | 0 refills | Status: DC

## 2016-03-01 NOTE — Patient Instructions (Signed)
Begin keflex 3 times daily for the cyst on your chest. Increase lisinopril from 20mg  to 40mg  for your blood pressure.  Call if increased redness/swelling/drainage surrounding boil or if you develop fever.  You will be contacted about your referral to the surgeon.

## 2016-03-01 NOTE — Progress Notes (Signed)
Subjective:    Patient ID: Cassidy Harrell, female    DOB: [birth year], 66 y.o.   MRN: [id number]  HPI  Cassidy Harrell is a 66 yr old female who presents today to discuss a cyst in the center of her chest. She had a flare up of this back in 2016. Reports that it began to spontaneously drain.    HTN- reports good compliance with lisinopril.  BP Readings from Last 3 Encounters:  03/01/16 (!) 165/69  10/15/15 138/70  07/21/15 104/82      Review of Systems See HPI  Past Medical History:  Diagnosis Date  . Arthritis   . Depression   . Gum disease   . Heart murmur   . History of syphilis   . Hypertension   . Irregular heart beat   . Personal history of colonic polyps   . Phlebitis   . Pre-diabetes      Social History   Social History  . Marital status: Divorced    Spouse name: N/A  . Number of children: 1  . Years of education: N/A   Occupational History  . Not on file.   Social History Main Topics  . Smoking status: Former Smoker    Types: Cigarettes  . Smokeless tobacco: Never Used  . Alcohol use No  . Drug use: No  . Sexual activity: Not on file   Other Topics Concern  . 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 SHOULDER ARTHROSCOPY Right 12/2015  . SPINE SURGERY  08/20/2015   bulging / herniated disc in C-spine  . THERAPEUTIC ABORTION  1970. 1974, 1978    Family History  Problem Relation Age of Onset  . Diabetes Mother   . Cancer Father     lung cancer  . Kidney disease Father   . Seizures Father   . Cancer Brother     colon  . Heart  attack Brother   . Stroke Brother   . Colon cancer Neg Hx   . Stomach cancer Neg Hx     Allergies  Allergen Reactions  . Aldactone [Spironolactone] Other (See Comments)    itching    Current Outpatient Prescriptions on File Prior to Visit  Medication Sig Dispense Refill  . aspirin 81 MG chewable tablet Chew 81 mg by mouth daily.      . Calcium Carbonate-Vitamin D (CALTRATE 600+D) 600-400 MG-UNIT per tablet Take 1 tablet by mouth 2 (two) times daily.    . Ferrous Gluconate (IRON) 240 (27 FE) MG TABS Take 1 tablet by mouth daily.       No current facility-administered medications on file prior to visit.     BP (!) 158/82   Pulse 81   Temp 97.9 F (36.6 C) (Oral)   Resp 18   Ht 5\' 3"  (1.6 m)   Wt 185 lb 3.2 oz (84 kg)   SpO2 100% Comment: room air  BMI 32.81 kg/m  Objective:   Physical Exam  Constitutional: She is oriented to person, place, and time. She appears well-developed and well-nourished. No distress.  Musculoskeletal: She exhibits no edema.  Neurological: She is alert and oriented to person, place, and time.  Skin: Skin is warm and dry.  + boil noted in mid chest. fluctuant  Psychiatric: She has a normal mood and affect. Her behavior is normal. Thought content normal.          Assessment & Plan:  Boil- recurrent.  Procedure including risks/benefits explained to patient.  Questions were answered. After informed consent was obtained and a time out completed, site was cleansed with betadine and then alcohol. 1% Lidocaine was infiltrated into the abscess and surrounding tissues and then incision and drainage was performed with expression of purulent drainage. sis.  Pt tolerated procedure well.  Specimen sent for pathology review.  Pt instructed  to contact us if she develops redness, drainage or swelling at the site.  Will rx with keflex and refer to general surgeon for deeper incision and cyst removal at a later date given recurrent nature of the  infection.  HTN- uncontrolled. Increase lisinopril from 20mg  to 40mg  once daily. Follow up in 1 week for bp recheck and bmet.

## 2016-03-01 NOTE — Progress Notes (Signed)
Pre visit review using our clinic review tool, if applicable. No additional management support is needed unless otherwise documented below in the visit note.,  

## 2016-03-10 ENCOUNTER — Ambulatory Visit (INDEPENDENT_AMBULATORY_CARE_PROVIDER_SITE_OTHER): Payer: Medicare Other | Admitting: Family

## 2016-03-10 ENCOUNTER — Encounter: Payer: Self-pay | Admitting: Family

## 2016-03-10 VITALS — BP 128/63 | HR 99 | Temp 98.1°F | Resp 16 | Ht 63.0 in | Wt 183.0 lb

## 2016-03-10 DIAGNOSIS — Z23 Encounter for immunization: Secondary | ICD-10-CM

## 2016-03-10 DIAGNOSIS — L0291 Cutaneous abscess, unspecified: Secondary | ICD-10-CM | POA: Diagnosis not present

## 2016-03-10 NOTE — Progress Notes (Signed)
Pre visit review using our clinic review tool, if applicable. No additional management support is needed unless otherwise documented below in the visit note. 

## 2016-03-10 NOTE — Patient Instructions (Signed)
Please complete antibiotics. We will check the status of your referral to the surgeon.

## 2016-03-12 NOTE — Progress Notes (Signed)
Subjective:    Patient ID: Cassidy Harrell, female    DOB: 1950-03-05, 66 y.o.   MRN: [id number]  HPI  Cassidy Harrell presents today for a 1 week follow up of her abscess between her breasts overlying her sternum. She reports significant improvement. She is just about done with her antibiotics.   Review of Systems See HPI  Past Medical History:  Diagnosis Date  . Arthritis   . Depression   . Gum disease   . Heart murmur   . History of syphilis   . Hypertension   . Irregular heart beat   . Personal history of colonic polyps   . Phlebitis   . Pre-diabetes      Social History   Social History  . Marital status: Divorced    Spouse name: N/A  . Number of children: 1  . Years of education: N/A   Occupational History  . Not on file.   Social History Main Topics  . Smoking status: Former Smoker    Types: Cigarettes  . Smokeless tobacco: Never Used  . Alcohol use No  . Drug use: No  . Sexual activity: Not on file   Other Topics Concern  . 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 SHOULDER ARTHROSCOPY Right 12/2015  . SPINE SURGERY  08/20/2015   bulging / herniated disc in C-spine  . THERAPEUTIC ABORTION  1970. 1974, 1978    Family History  Problem Relation Age of Onset  . Diabetes Mother   . Cancer Father     lung cancer  . Kidney disease Father   . Seizures Father   . Cancer Brother     colon  . Heart attack Brother   . Stroke Brother   . Colon cancer Neg Hx   . Stomach cancer Neg Hx     Allergies  Allergen Reactions  . Aldactone  [Spironolactone] Other (See Comments)    itching    Current Outpatient Prescriptions on File Prior to Visit  Medication Sig Dispense Refill  . aspirin 81 MG chewable tablet Chew 81 mg by mouth daily.      . Calcium Carbonate-Vitamin D (CALTRATE 600+D) 600-400 MG-UNIT per tablet Take 1 tablet by mouth 2 (two) times daily.    . cephALEXin (KEFLEX) 500 MG capsule Take 1 capsule (500 mg total) by mouth 3 (three) times daily. 21 capsule 0  . Ferrous Gluconate (IRON) 240 (27 FE) MG TABS Take 1 tablet by mouth daily.      Marland Kitchen lisinopril (PRINIVIL,ZESTRIL) 40 MG tablet Take 1 tablet (40 mg total) by mouth daily. 30 tablet 3   No current facility-administered medications on file prior to visit.     BP 128/63 (BP Location: Left Arm, Cuff Size: Large)   Pulse 99   Temp 98.1 F (36.7 C) (Oral)   Resp 16   Ht 5\' 3"  (1.6 m)   Wt 183 lb (83 kg)   SpO2 99%   BMI  32.42 kg/m       Objective:   Physical Exam  Constitutional: She appears well-developed and well-nourished.  Skin:  Significant improvement in the abscess noted on the chest wall overlying her sternum.  Small pea sized firm nodule remains.           Assessment & Plan:  Abscess- improved.  I think she will require a deeper excision from general surgery to avoid recurrence.  I checked with our scheduler and this appointment has been scheduled and pt has been notified of her appointment.

## 2016-03-13 DIAGNOSIS — M25511 Pain in right shoulder: Secondary | ICD-10-CM | POA: Diagnosis not present

## 2016-03-20 DIAGNOSIS — M25511 Pain in right shoulder: Secondary | ICD-10-CM | POA: Diagnosis not present

## 2016-03-27 DIAGNOSIS — M7541 Impingement syndrome of right shoulder: Secondary | ICD-10-CM | POA: Diagnosis not present

## 2016-03-27 DIAGNOSIS — Z4789 Encounter for other orthopedic aftercare: Secondary | ICD-10-CM | POA: Diagnosis not present

## 2016-03-30 DIAGNOSIS — M25511 Pain in right shoulder: Secondary | ICD-10-CM | POA: Diagnosis not present

## 2016-04-04 DIAGNOSIS — M25511 Pain in right shoulder: Secondary | ICD-10-CM | POA: Diagnosis not present

## 2016-04-05 ENCOUNTER — Other Ambulatory Visit: Payer: Self-pay | Admitting: Family

## 2016-04-05 DIAGNOSIS — Z1231 Encounter for screening mammogram for malignant neoplasm of breast: Secondary | ICD-10-CM

## 2016-04-06 DIAGNOSIS — M25511 Pain in right shoulder: Secondary | ICD-10-CM | POA: Diagnosis not present

## 2016-04-13 DIAGNOSIS — D235 Other benign neoplasm of skin of trunk: Secondary | ICD-10-CM | POA: Diagnosis not present

## 2016-05-10 ENCOUNTER — Other Ambulatory Visit: Payer: Self-pay | Admitting: General Surgery

## 2016-05-10 DIAGNOSIS — L720 Epidermal cyst: Secondary | ICD-10-CM | POA: Diagnosis not present

## 2016-05-10 DIAGNOSIS — L723 Sebaceous cyst: Secondary | ICD-10-CM | POA: Diagnosis not present

## 2016-05-18 ENCOUNTER — Ambulatory Visit: Payer: Medicare Other

## 2016-06-06 ENCOUNTER — Ambulatory Visit: Payer: Medicare Other

## 2016-06-20 ENCOUNTER — Ambulatory Visit: Payer: Medicare Other

## 2016-06-27 ENCOUNTER — Telehealth: Payer: Self-pay | Admitting: *Deleted

## 2016-06-27 MED ORDER — LISINOPRIL 40 MG PO TABS: 40.0000 mg | ORAL_TABLET | Freq: Every day | ORAL | 0 refills | Status: DC

## 2016-06-27 NOTE — Telephone Encounter (Signed)
Received fax from [facility] ([place]) requesting 90 day supply of pt's lisinopril 40mg . Refill sent. Pt last seen 02/2016 for cellulitis and has no future follow ups. Please advise when pt should be seen in the office?

## 2016-06-27 NOTE — Telephone Encounter (Signed)
6 months from last HTN follow up visit please.

## 2016-06-27 NOTE — Telephone Encounter (Signed)
Pt will be due 07/30/16. Mychart message sent to pt.

## 2016-07-05 ENCOUNTER — Other Ambulatory Visit: Payer: Self-pay | Admitting: Family

## 2016-10-03 ENCOUNTER — Encounter: Payer: Self-pay | Admitting: Family

## 2016-10-03 ENCOUNTER — Ambulatory Visit (INDEPENDENT_AMBULATORY_CARE_PROVIDER_SITE_OTHER): Payer: Medicare Other | Admitting: Family

## 2016-10-03 VITALS — BP 124/84 | HR 93 | Temp 97.8°F | Ht 63.0 in | Wt 185.0 lb

## 2016-10-03 DIAGNOSIS — Z862 Personal history of diseases of the blood and blood-forming organs and certain disorders involving the immune mechanism: Secondary | ICD-10-CM | POA: Diagnosis not present

## 2016-10-03 DIAGNOSIS — E119 Type 2 diabetes mellitus without complications: Secondary | ICD-10-CM

## 2016-10-03 DIAGNOSIS — I10 Essential (primary) hypertension: Secondary | ICD-10-CM

## 2016-10-03 LAB — CBC WITH DIFFERENTIAL/PLATELET
BASOS ABS: 0 10*3/uL (ref 0.0–0.1)
BASOS PCT: 0.4 % (ref 0.0–3.0)
Eosinophils Absolute: 0.1 10*3/uL (ref 0.0–0.7)
Eosinophils Relative: 2.6 % (ref 0.0–5.0)
HCT: 40.2 % (ref 36.0–46.0)
Hemoglobin: 13.1 g/dL (ref 12.0–15.0)
Lymphocytes Relative: 45.2 % (ref 12.0–46.0)
Lymphs Abs: 2.1 10*3/uL (ref 0.7–4.0)
MCHC: 32.6 g/dL (ref 30.0–36.0)
MCV: 84.1 fl (ref 78.0–100.0)
MONO ABS: 0.5 10*3/uL (ref 0.1–1.0)
Monocytes Relative: 11.2 % (ref 3.0–12.0)
NEUTROS ABS: 1.9 10*3/uL (ref 1.4–7.7)
Neutrophils Relative %: 40.6 % — ABNORMAL LOW (ref 43.0–77.0)
PLATELETS: 193 10*3/uL (ref 150.0–400.0)
RBC: 4.78 Mil/uL (ref 3.87–5.11)
RDW: 14.3 % (ref 11.5–15.5)
WBC: 4.7 10*3/uL (ref 4.0–10.5)

## 2016-10-03 LAB — BASIC METABOLIC PANEL
BUN: 9 mg/dL (ref 6–23)
CO2: 27 mEq/L (ref 19–32)
CREATININE: 0.69 mg/dL (ref 0.40–1.20)
Calcium: 9.3 mg/dL (ref 8.4–10.5)
Chloride: 105 mEq/L (ref 96–112)
GFR: 109.6 mL/min (ref >60.00)
Glucose, Bld: 81 mg/dL (ref 70–99)
Potassium: 3.7 mEq/L (ref 3.5–5.1)
SODIUM: 138 meq/L (ref 135–145)

## 2016-10-03 LAB — LIPID PANEL
CHOL/HDL RATIO: 2
CHOLESTEROL: 82 mg/dL (ref 0–200)
HDL: 50 mg/dL (ref >39.00)
LDL Cholesterol: 13 mg/dL (ref 0–99)
NonHDL: 32.27
TRIGLYCERIDES: 94 mg/dL (ref 0.0–149.0)
VLDL: 18.8 mg/dL (ref 0.0–40.0)

## 2016-10-03 MED ORDER — LISINOPRIL 40 MG PO TABS: 40.0000 mg | ORAL_TABLET | Freq: Every day | ORAL | 1 refills | Status: DC

## 2016-10-03 NOTE — Progress Notes (Signed)
Subjective:    Patient ID: Cassidy Harrell, female    DOB: 10-17-1950, 66 y.o.   MRN: [id number]  HPI  Cassidy Harrell is a 65 year old female who presents today for follow-up.  DM2- she is currently maintained on= diabetic diet only. She is maintained on aspirin 81 mg by mouth daily  Lab Results  Component Value Date   HGBA1C 5.9 10/15/2015   HGBA1C 6.0 09/16/2014   HGBA1C 6.4 (H) 03/06/2014   Lab Results  Component Value Date   MICROALBUR 0.7 09/16/2014   LDLCALC 11 05/19/2015   CREATININE 0.83 10/15/2015   HTN- she is maintained on lisinopril 40 mg by mouth daily. Denies CP/SOB, has chronic LE edema.   BP Readings from Last 3 Encounters:  10/03/16 124/84  03/10/16 128/63  03/01/16 (!) 158/82   History of anemia-continues iron supplement.  Lab Results  Component Value Date   WBC 5.2 05/19/2015   HGB 12.8 05/19/2015   HCT 38.6 05/19/2015   MCV 83.8 05/19/2015   PLT 177.0 05/19/2015    Review of Systems See HPI  Past Medical History:  Diagnosis Date  . Arthritis   . Depression   . Gum disease   . Heart murmur   . History of syphilis   . Hypertension   . Irregular heart beat   . Personal history of colonic polyps   . Phlebitis   . Pre-diabetes      Social History   Social History  . Marital status: Divorced    Spouse name: N/A  . Number of children: 1  . Years of education: N/A   Occupational History  . Not on file.   Social History Main Topics  . Smoking status: Former Smoker    Types: Cigarettes  . Smokeless tobacco: Never Used  . Alcohol use No  . Drug use: No  . Sexual activity: Not on file   Other Topics Concern  . 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 SHOULDER ARTHROSCOPY Right 12/2015  . SPINE SURGERY  08/20/2015   bulging / herniated disc in C-spine  . THERAPEUTIC ABORTION  1970. 1974, 1978    Family History  Problem Relation Age of Onset  . Diabetes Mother   . Cancer Father        lung cancer  . Kidney disease Father   . Seizures Father   . Cancer Brother        colon  . Heart attack Brother   . Stroke Brother   . Colon cancer Neg Hx   . Stomach cancer Neg Hx     Allergies  Allergen Reactions  . Aldactone [Spironolactone] Other (See Comments)    itching    Current Outpatient Prescriptions on File Prior to Visit  Medication Sig Dispense Refill  . aspirin 81 MG chewable tablet Chew 81 mg by mouth daily.      . Calcium Carbonate-Vitamin D (CALTRATE 600+D) 600-400 MG-UNIT per tablet Take 1 tablet by  mouth 2 (two) times daily.    . Ferrous Gluconate (IRON) 240 (27 FE) MG TABS Take 1 tablet by mouth daily.       No current facility-administered medications on file prior to visit.     BP 124/84 (BP Location: Left Arm, Patient Position: Sitting, Cuff Size: Normal)   Pulse 93   Temp 97.8 F (36.6 C) (Oral)   Ht 5\' 3"  (1.6 m)   Wt 185 lb (83.9 kg)   SpO2 98%   BMI 32.77 kg/m       Objective:   Physical Exam  Constitutional: She appears well-developed and well-nourished.  Cardiovascular: Normal rate, regular rhythm and normal heart sounds.   No murmur heard. Pulmonary/Chest: Effort normal and breath sounds normal. No respiratory distress. She has no wheezes.  Musculoskeletal:  2+ bilateral lower extremity edema  Psychiatric: She has a normal mood and affect. Her behavior is normal. Judgment and thought content normal.          Assessment & Plan:  Diabetes type 2-will obtain A1c. We discussed diabetic diet.  Hypertension-blood pressure stable on current medications continue  same.  History of anemia-maintained on iron supplement. Continue same, obtain follow-up CBC.

## 2016-10-03 NOTE — Patient Instructions (Addendum)
Please complete lab wokr prior to leaving. Schedule a medicare wellness visit.

## 2016-10-03 NOTE — Progress Notes (Signed)
Pre visit review using our clinic review tool, if applicable. No additional management support is needed unless otherwise documented below in the visit note. 

## 2016-10-04 LAB — HEMOGLOBIN A1C
Hgb A1c MFr Bld: 6 % — ABNORMAL HIGH (ref <5.7)
Mean Plasma Glucose: 126 mg/dL

## 2016-11-09 ENCOUNTER — Encounter: Payer: Self-pay | Admitting: Gastroenterology

## 2016-12-26 DIAGNOSIS — H903 Sensorineural hearing loss, bilateral: Secondary | ICD-10-CM | POA: Diagnosis not present

## 2017-01-02 DIAGNOSIS — Z23 Encounter for immunization: Secondary | ICD-10-CM | POA: Diagnosis not present

## 2017-03-05 ENCOUNTER — Encounter: Payer: Self-pay | Admitting: Medical

## 2017-03-05 ENCOUNTER — Ambulatory Visit (INDEPENDENT_AMBULATORY_CARE_PROVIDER_SITE_OTHER): Payer: Medicare Other | Admitting: Medical

## 2017-03-05 VITALS — BP 159/63 | HR 81 | Temp 97.6°F | Resp 16 | Ht 63.0 in | Wt 194.6 lb

## 2017-03-05 DIAGNOSIS — R05 Cough: Secondary | ICD-10-CM

## 2017-03-05 DIAGNOSIS — J0100 Acute maxillary sinusitis, unspecified: Secondary | ICD-10-CM | POA: Diagnosis not present

## 2017-03-05 DIAGNOSIS — J40 Bronchitis, not specified as acute or chronic: Secondary | ICD-10-CM | POA: Diagnosis not present

## 2017-03-05 DIAGNOSIS — R195 Other fecal abnormalities: Secondary | ICD-10-CM | POA: Diagnosis not present

## 2017-03-05 DIAGNOSIS — R059 Cough, unspecified: Secondary | ICD-10-CM

## 2017-03-05 MED ORDER — AZITHROMYCIN 250 MG PO TABS: ORAL_TABLET | ORAL | 0 refills | Status: DC

## 2017-03-05 MED ORDER — FLUTICASONE PROPIONATE 50 MCG/ACT NA SUSP: 2.0000 | Freq: Every day | NASAL | 1 refills | Status: DC

## 2017-03-05 MED ORDER — BENZONATATE 100 MG PO CAPS: 100.0000 mg | ORAL_CAPSULE | Freq: Three times a day (TID) | ORAL | 0 refills | Status: DC | PRN

## 2017-03-05 NOTE — Patient Instructions (Addendum)
You appear to have bronchitis and sinusitis type symptoms that have lingered since flu like illness around christmas. Rest hydrate and tylenol for fever. I am prescribing cough medicine benzonatate, and azithromycin antibiotic. For your nasal congestion rx flonase.  You should gradually get better. If not then notify us and would recommend a chest xray.  For loose stools bland diet guidelines. If diarrhea worsens then notify us and would get stool panel kit. Increase probiotics in diet while on antibiotic.  Follow up in 7-10 days or as needed

## 2017-03-05 NOTE — Progress Notes (Signed)
Subjective:    Patient ID: Cassidy Harrell, female    DOB: 29-Apr-1950, 67 y.o.   MRN: [id number]  HPI  Pt in states sick since christmas. Pt states around then had st, nasal congestion, chest congestion, sinus pressure and some cough. Pt states some symptoms subsided some  but overall still not recovered. Most prominent is sinus pressure.   Pt early on tried dayquil and nyquil.  Early on had fever, chills, sweats and body aches for 1st day around christmas. She did get flu vaccine in November.  Pt had some on and off loose stools. Some formed.   Review of Systems  Constitutional: Negative for chills, fatigue and fever.  HENT: Positive for congestion, postnasal drip, sinus pressure and sinus pain. Negative for facial swelling and rhinorrhea.   Respiratory: Negative for cough, chest tightness and wheezing.   Cardiovascular: Negative for chest pain and palpitations.  Gastrointestinal: Negative for abdominal pain.  Musculoskeletal: Negative for back pain and gait problem.  Skin: Negative for rash.  Neurological: Negative for dizziness, weakness, light-headedness and numbness.  Hematological: Negative for adenopathy. Does not bruise/bleed easily.  Psychiatric/Behavioral: Negative for behavioral problems.    Past Medical History:  Diagnosis Date  . Arthritis   . Depression   . Gum disease   . Heart murmur   . History of syphilis   . Hypertension   . Irregular heart beat   . Personal history of colonic polyps   . Phlebitis   . Pre-diabetes      Social History   Socioeconomic History  . Marital status: Divorced    Spouse name: Not on file  . Number of children: 1  . Years of education: Not on file  . Highest education level: Not on file  Social Needs  . Financial resource strain: Not on file  . Food insecurity - worry: Not on file  . Food insecurity - inability: Not on file  . Transportation needs - medical: Not on file  . Transportation needs - non-medical: Not on  file  Occupational History  . Not on file  Tobacco Use  . Smoking status: Former Smoker    Types: Cigarettes  . Smokeless tobacco: Never Used  Substance and Sexual Activity  . Alcohol use: No  . Drug use: No  . Sexual activity: Not on file  Other Topics Concern  . 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 SHOULDER ARTHROSCOPY Right 12/2015  . SPINE SURGERY  08/20/2015   bulging / herniated disc in C-spine  . THERAPEUTIC ABORTION  1970. 1974, 1978    Family History  Problem Relation Age of Onset  . Diabetes Mother   . Cancer Father        lung cancer  . Kidney disease Father   . Seizures Father   . Cancer Brother        colon  . Heart attack Brother   . Stroke Brother   . Colon cancer Neg Hx   . Stomach  cancer Neg Hx     Allergies  Allergen Reactions  . Aldactone [Spironolactone] Other (See Comments)    itching    Current Outpatient Medications on File Prior to Visit  Medication Sig Dispense Refill  . aspirin 81 MG chewable tablet Chew 81 mg by mouth daily.      . Calcium Carbonate-Vitamin D (CALTRATE 600+D) 600-400 MG-UNIT per tablet Take 1 tablet by mouth 2 (two) times daily.    . Ferrous Gluconate (IRON) 240 (27 FE) MG TABS Take 1 tablet by mouth daily.      Marland Kitchen lisinopril (PRINIVIL,ZESTRIL) 40 MG tablet Take 1 tablet (40 mg total) by mouth daily. 90 tablet 1   No current facility-administered medications on file prior to visit.     BP (!) 159/63   Pulse 81   Temp 97.6 F (36.4 C) (Oral)   Resp 16   Ht '5\' 3"'  (1.6 m)   Wt 194 lb 9.6 oz (88.3 kg)   SpO2  99%   BMI 34.47 kg/m       Objective:   Physical Exam  General  Mental Status - Alert. General Appearance - Well groomed. Not in acute distress.  Skin Rashes- No Rashes.  HEENT Head- Normal. Ear Auditory Canal - Left- Normal. Right - Normal.Tympanic Membrane- Left- Normal. Right- Normal. Eye Sclera/Conjunctiva- Left- Normal. Right- Normal. Nose & Sinuses Nasal Mucosa- Left-  Boggy and Congested. Right-  Boggy and  Congested.Bilateral faint maxillary but no frontal sinus pressure. Mouth & Throat Lips: Upper Lip- Normal: no dryness, cracking, pallor, cyanosis, or vesicular eruption. Lower Lip-Normal: no dryness, cracking, pallor, cyanosis or vesicular eruption. Buccal Mucosa- Bilateral- No Aphthous ulcers. Oropharynx- No Discharge or Erythema. Tonsils: Characteristics- Bilateral- No Erythema or Congestion. Size/Enlargement- Bilateral- No enlargement. Discharge- bilateral-None.  Neck Neck- Supple. No Masses.   Chest and Lung Exam Auscultation: Breath Sounds:-Clear even and unlabored.  Cardiovascular Auscultation:Rythm- Regular, rate and rhythm. Murmurs & Other Heart Sounds:Ausculatation of the heart reveal- No Murmurs.  Lymphatic Head & Neck General Head & Neck Lymphatics: Bilateral: Description- No Localized lymphadenopathy.       Assessment & Plan:  You appear to have bronchitis and sinusitis type symptoms that have lingered since flu like illness around christmas. Rest hydrate and tylenol for fever. I am prescribing cough medicine benzonatate, and azithromycin antibiotic. For your nasal congestion rx flonase.  You should gradually get better. If not then notify us and would recommend a chest xray.  For loose stools bland diet guidelines. If diarrhea worsens then notify us and would get stool panel kit. Increase probiotics in diet while on antibiotic.  Follow up in 7-10 days or as needed  Monterius Rolf, Percell Miller, [employer]

## 2017-03-30 ENCOUNTER — Other Ambulatory Visit: Payer: Self-pay | Admitting: Family

## 2017-03-30 NOTE — Telephone Encounter (Signed)
Patient sent in refill request for lisinopril. He was to follow up 6 months from last ov which would land this month. I have sent patient in 30 day supply but will need office visit for further refills.

## 2017-04-03 ENCOUNTER — Ambulatory Visit: Payer: Medicare Other | Admitting: Family

## 2017-04-04 ENCOUNTER — Encounter: Payer: Self-pay | Admitting: Family

## 2017-04-04 ENCOUNTER — Ambulatory Visit (INDEPENDENT_AMBULATORY_CARE_PROVIDER_SITE_OTHER): Payer: Medicare Other | Admitting: Family

## 2017-04-04 VITALS — BP 140/76 | HR 80 | Temp 98.2°F | Resp 16 | Ht 63.0 in | Wt 195.0 lb

## 2017-04-04 DIAGNOSIS — Z23 Encounter for immunization: Secondary | ICD-10-CM

## 2017-04-04 DIAGNOSIS — I10 Essential (primary) hypertension: Secondary | ICD-10-CM | POA: Diagnosis not present

## 2017-04-04 DIAGNOSIS — E119 Type 2 diabetes mellitus without complications: Secondary | ICD-10-CM | POA: Diagnosis not present

## 2017-04-04 DIAGNOSIS — E010 Iodine-deficiency related diffuse (endemic) goiter: Secondary | ICD-10-CM

## 2017-04-04 DIAGNOSIS — D509 Iron deficiency anemia, unspecified: Secondary | ICD-10-CM

## 2017-04-04 LAB — CBC WITH DIFFERENTIAL/PLATELET
Basophils Absolute: 0 10*3/uL (ref 0.0–0.1)
Basophils Relative: 0.4 % (ref 0.0–3.0)
EOS PCT: 0.6 % (ref 0.0–5.0)
Eosinophils Absolute: 0 10*3/uL (ref 0.0–0.7)
HEMATOCRIT: 39.2 % (ref 36.0–46.0)
HEMOGLOBIN: 12.9 g/dL (ref 12.0–15.0)
Lymphocytes Relative: 48.3 % — ABNORMAL HIGH (ref 12.0–46.0)
Lymphs Abs: 2 10*3/uL (ref 0.7–4.0)
MCHC: 32.8 g/dL (ref 30.0–36.0)
MCV: 83.7 fl (ref 78.0–100.0)
MONOS PCT: 9.7 % (ref 3.0–12.0)
Monocytes Absolute: 0.4 10*3/uL (ref 0.1–1.0)
Neutro Abs: 1.7 10*3/uL (ref 1.4–7.7)
Neutrophils Relative %: 41 % — ABNORMAL LOW (ref 43.0–77.0)
Platelets: 183 10*3/uL (ref 150.0–400.0)
RBC: 4.68 Mil/uL (ref 3.87–5.11)
RDW: 14.9 % (ref 11.5–15.5)
WBC: 4.2 10*3/uL (ref 4.0–10.5)

## 2017-04-04 LAB — FERRITIN: Ferritin: 85.3 ng/mL (ref 10.0–291.0)

## 2017-04-04 LAB — BASIC METABOLIC PANEL
BUN: 12 mg/dL (ref 6–23)
CO2: 27 mEq/L (ref 19–32)
Calcium: 9.5 mg/dL (ref 8.4–10.5)
Chloride: 104 mEq/L (ref 96–112)
Creatinine, Ser: 0.7 mg/dL (ref 0.40–1.20)
GFR: 107.63 mL/min (ref >60.00)
GLUCOSE: 105 mg/dL — AB (ref 70–99)
POTASSIUM: 4 meq/L (ref 3.5–5.1)
SODIUM: 140 meq/L (ref 135–145)

## 2017-04-04 LAB — TSH: TSH: 4.26 u[IU]/mL (ref 0.35–4.50)

## 2017-04-04 LAB — IRON: Iron: 88 ug/dL (ref 42–145)

## 2017-04-04 LAB — HEMOGLOBIN A1C: Hgb A1c MFr Bld: 6.6 % — ABNORMAL HIGH (ref 4.6–6.5)

## 2017-04-04 NOTE — Progress Notes (Signed)
Subjective:    Patient ID: Cassidy Harrell, female    DOB: March 23, 1950, 67 y.o.   MRN: [id number]  HPI   Patient is a 67 yr old female who presents today for routine follow up.  1) DM2- diet controlled.  On ACE and aspirin 81mg .  Reports some changes in her diet. Wt Readings from Last 3 Encounters:  04/04/17 195 lb (88.5 kg)  03/05/17 194 lb 9.6 oz (88.3 kg)  10/03/16 185 lb (83.9 kg)    Lab Results  Component Value Date   HGBA1C 6.0 (H) 10/03/2016   HGBA1C 5.9 10/15/2015   HGBA1C 6.0 09/16/2014   Lab Results  Component Value Date   MICROALBUR 0.7 09/16/2014   LDLCALC 13 10/03/2016   CREATININE 0.69 10/03/2016   2) Anemia-  Maintained on iron once daily. Lab Results  Component Value Date   WBC 4.7 10/03/2016   HGB 13.1 10/03/2016   HCT 40.2 10/03/2016   MCV 84.1 10/03/2016   PLT 193.0 10/03/2016   3) HTN-  Maintained on lisinopril.  BP Readings from Last 3 Encounters:  04/04/17 140/76  03/05/17 (!) 159/63  10/03/16 124/84   Reports occasional intermittent sciatic pain- declines further rx or treatment at this time      Review of Systems    see HPI  Past Medical History:  Diagnosis Date  . Arthritis   . Depression   . Gum disease   . Heart murmur   . History of syphilis   . Hypertension   . Irregular heart beat   . Personal history of colonic polyps   . Phlebitis   . Pre-diabetes      Social History   Socioeconomic History  . Marital status: Divorced    Spouse name: Not on file  . Number of children: 1  . Years of education: Not on file  . Highest education level: Not on file  Social Needs  . Financial resource strain: Not on file  . Food insecurity - worry: Not on file  . Food insecurity - inability: Not on file  . Transportation needs - medical: Not on file  . Transportation needs - non-medical: Not on file  Occupational History  . Not on file  Tobacco Use  . Smoking status: Former Smoker    Types: Cigarettes  . Smokeless  tobacco: Never Used  Substance and Sexual Activity  . Alcohol use: No  . Drug use: No  . Sexual activity: Not on file  Other Topics Concern  . 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 SHOULDER ARTHROSCOPY Right 12/2015  . SPINE SURGERY  08/20/2015   bulging / herniated disc in C-spine  . THERAPEUTIC ABORTION  1970. 1974, 1978    Family History  Problem Relation Age of Onset  . Diabetes Mother   . Cancer Father        lung cancer  . Kidney disease Father   . Seizures Father   . Cancer Brother  colon  . Heart attack Brother   . Stroke Brother   . Colon cancer Neg Hx   . Stomach cancer Neg Hx     Allergies  Allergen Reactions  . Aldactone [Spironolactone] Other (See Comments)    itching    Current Outpatient Medications on File Prior to Visit  Medication Sig Dispense Refill  . aspirin 81 MG chewable tablet Chew 81 mg by mouth daily.      . Calcium Carbonate-Vitamin D (CALTRATE 600+D) 600-400 MG-UNIT per tablet Take 1 tablet by mouth 2 (two) times daily.    . Ferrous Gluconate (IRON) 240 (27 FE) MG TABS Take 1 tablet by mouth daily.      . fluticasone (FLONASE) 50 MCG/ACT nasal spray Place 2 sprays into both nostrils daily. 16 g 1  . lisinopril (PRINIVIL,ZESTRIL) 40 MG tablet TAKE 1 TABLET BY MOUTH ONCE DAILY 30 tablet 0   No current facility-administered medications on file prior to visit.     BP 140/76   Pulse 80   Temp 98.2 F (36.8 C) (Oral)   Resp 16   Ht 5\' 3"  (1.6 m)   Wt 195 lb (88.5 kg)   SpO2 99%   BMI 34.54 kg/m    Objective:   Physical  Exam  Constitutional: She appears well-developed and well-nourished.  HENT:  Head: Normocephalic.  Neck:  + thryomegally R>L  Cardiovascular: Normal rate, regular rhythm and normal heart sounds.  No murmur heard. Pulmonary/Chest: Effort normal and breath sounds normal. No respiratory distress. She has no wheezes.  Psychiatric: She has a normal mood and affect. Her behavior is normal. Judgment and thought content normal.          Assessment & Plan:  DM2- stable.  Continue dm diet.  Check a1c.  Thyromegaly- check tsh, US thyroid.   Anemia-  Cbc stable, check follow up CBC, diff, serum iron ferritin.   HTN- stable continue current meds.   prevnar today.

## 2017-04-04 NOTE — Addendum Note (Signed)
Addended by: Jiles Prows on: 04/04/2017 09:57 AM   Modules accepted: Orders

## 2017-04-04 NOTE — Patient Instructions (Addendum)
Please complete lab work prior to leaving.  You will be contacted about scheduling your thyroid ultrasound.  Follow up in 3 months.

## 2017-04-05 ENCOUNTER — Other Ambulatory Visit: Payer: Self-pay | Admitting: Family

## 2017-04-05 DIAGNOSIS — Z1231 Encounter for screening mammogram for malignant neoplasm of breast: Secondary | ICD-10-CM

## 2017-04-11 DIAGNOSIS — H2513 Age-related nuclear cataract, bilateral: Secondary | ICD-10-CM | POA: Diagnosis not present

## 2017-04-18 ENCOUNTER — Encounter: Payer: Self-pay | Admitting: Family

## 2017-04-24 ENCOUNTER — Ambulatory Visit: Payer: Self-pay | Admitting: *Deleted

## 2017-04-24 ENCOUNTER — Ambulatory Visit (HOSPITAL_BASED_OUTPATIENT_CLINIC_OR_DEPARTMENT_OTHER): Payer: Medicare Other

## 2017-04-24 ENCOUNTER — Encounter (HOSPITAL_BASED_OUTPATIENT_CLINIC_OR_DEPARTMENT_OTHER): Payer: Self-pay

## 2017-04-24 ENCOUNTER — Ambulatory Visit (HOSPITAL_BASED_OUTPATIENT_CLINIC_OR_DEPARTMENT_OTHER)
Admission: RE | Admit: 2017-04-24 | Discharge: 2017-04-24 | Disposition: A | Discharge status: Home / Self Care | Payer: Medicare Other | Source: Ambulatory Visit | Attending: Family | Admitting: Family

## 2017-04-24 DIAGNOSIS — Z1231 Encounter for screening mammogram for malignant neoplasm of breast: Secondary | ICD-10-CM | POA: Diagnosis not present

## 2017-04-24 NOTE — Telephone Encounter (Signed)
Pt reports palms of both hands and top of both feet started itching Sunday night   "after eating at a [employer]." states she took 25 mg of benadryl which helped.  States yesterday much better, hands itched " just a little." Took 25 mg of benadryl again last night. Noted red blotches along both sides and armpits this AM, slightly puffy. "Better this afternoon, not puffy, not itchy."  Reports 1-2 hours ago middle and right side of upper lip slightly swollen. States "can hardly see but I can tell it is puffy." Denies any SOB, difficulty swallowing, tongue is not swollen. Denies any pain, tenderness.  No new medications. Did start new "face cream 2 weeks ago."  Home care advise given per protocol. Instructed pt to call back if symptoms worsen, any SOB, pain, difficulty swallowing, tongue swelling.    Reason for Disposition . Mild lip swelling of unknown cause  Answer Assessment - Initial Assessment Questions 1. ONSET: "When did the swelling start?" (e.g., minutes, hours, days)     1-2 hours ago, upper one only 2. SEVERITY: "How swollen is it?"     "mild" "A little bit" 3. ITCHING: "Is there any itching?" If so, ask: "How much?"   (Scale 1-10; mild, moderate or severe)     Hands itchy.  Onset Sunday both palms itchy, feet itchy. Sides red "blottchy" 4. PAIN: "Is the swelling painful to touch?" If so, ask: "How painful is it?"   (Scale 1-10; mild, moderate or severe)     No 5. CAUSE: "What do you think is causing the lip swelling?"     Unsure 6. RECURRENT SYMPTOM: "Have you had lip swelling before?" If so, ask: "When was the last time?" "What happened that time?"     No 7. OTHER SYMPTOMS: "Do you have any other symptoms?" (e.g., toothache)    No  8. PREGNANCY: "Is there any chance you are pregnant?" "When was your last menstrual period?"     No  Protocols used: [facility]

## 2017-04-25 ENCOUNTER — Ambulatory Visit (HOSPITAL_BASED_OUTPATIENT_CLINIC_OR_DEPARTMENT_OTHER)
Admission: RE | Admit: 2017-04-25 | Discharge: 2017-04-25 | Disposition: A | Discharge status: Home / Self Care | Payer: Medicare Other | Source: Ambulatory Visit | Attending: Family | Admitting: Family

## 2017-04-25 DIAGNOSIS — E010 Iodine-deficiency related diffuse (endemic) goiter: Secondary | ICD-10-CM | POA: Diagnosis not present

## 2017-04-25 DIAGNOSIS — E042 Nontoxic multinodular goiter: Secondary | ICD-10-CM | POA: Diagnosis not present

## 2017-04-30 ENCOUNTER — Encounter: Payer: Self-pay | Admitting: Family

## 2017-04-30 ENCOUNTER — Other Ambulatory Visit: Payer: Self-pay | Admitting: Family

## 2017-04-30 MED ORDER — LISINOPRIL 40 MG PO TABS: 40.0000 mg | ORAL_TABLET | Freq: Every day | ORAL | 3 refills | Status: DC

## 2017-05-29 ENCOUNTER — Telehealth: Payer: Self-pay | Admitting: *Deleted

## 2017-05-29 MED ORDER — LISINOPRIL 40 MG PO TABS: 40.0000 mg | ORAL_TABLET | Freq: Every day | ORAL | 1 refills | Status: DC

## 2017-05-29 NOTE — Telephone Encounter (Signed)
Received fax from [facility] requesting 90 day supply of lisinopril 40mg  to be sent to local [facility]. Refill sent.

## 2017-07-03 ENCOUNTER — Ambulatory Visit: Payer: Medicare Other | Admitting: Family

## 2017-09-11 ENCOUNTER — Ambulatory Visit: Payer: Medicare Other | Admitting: Family

## 2017-09-13 ENCOUNTER — Ambulatory Visit (INDEPENDENT_AMBULATORY_CARE_PROVIDER_SITE_OTHER): Payer: Medicare Other | Admitting: Family

## 2017-09-13 ENCOUNTER — Encounter: Payer: Self-pay | Admitting: Family

## 2017-09-13 VITALS — BP 154/66 | HR 90 | Temp 97.8°F | Resp 18 | Ht 63.0 in | Wt 194.0 lb

## 2017-09-13 DIAGNOSIS — I10 Essential (primary) hypertension: Secondary | ICD-10-CM

## 2017-09-13 DIAGNOSIS — M5416 Radiculopathy, lumbar region: Secondary | ICD-10-CM | POA: Diagnosis not present

## 2017-09-13 DIAGNOSIS — E119 Type 2 diabetes mellitus without complications: Secondary | ICD-10-CM

## 2017-09-13 LAB — HEMOGLOBIN A1C: Hgb A1c MFr Bld: 6.7 % — ABNORMAL HIGH (ref 4.6–6.5)

## 2017-09-13 LAB — BASIC METABOLIC PANEL
BUN: 10 mg/dL (ref 6–23)
CHLORIDE: 106 meq/L (ref 96–112)
CO2: 27 mEq/L (ref 19–32)
CREATININE: 0.76 mg/dL (ref 0.40–1.20)
Calcium: 9 mg/dL (ref 8.4–10.5)
GFR: 97.76 mL/min (ref >60.00)
Glucose, Bld: 153 mg/dL — ABNORMAL HIGH (ref 70–99)
POTASSIUM: 3.7 meq/L (ref 3.5–5.1)
Sodium: 139 mEq/L (ref 135–145)

## 2017-09-13 MED ORDER — METOPROLOL SUCCINATE ER 25 MG PO TB24: 25.0000 mg | ORAL_TABLET | Freq: Every day | ORAL | 3 refills | Status: DC

## 2017-09-13 MED ORDER — MELOXICAM 7.5 MG PO TABS: 7.5000 mg | ORAL_TABLET | Freq: Every day | ORAL | 0 refills | Status: DC

## 2017-09-13 NOTE — Progress Notes (Signed)
Subjective:    Patient ID: Cassidy Harrell, female    DOB: 06-01-1950, 67 y.o.   MRN: [id number]  HPI  Patient is a 67 yr old female who presents today for follow up:  1) DM2- diet controlled.   Lab Results  Component Value Date   HGBA1C 6.6 (H) 04/04/2017   HGBA1C 6.0 (H) 10/03/2016   HGBA1C 5.9 10/15/2015   Lab Results  Component Value Date   MICROALBUR 0.7 09/16/2014   LDLCALC 13 10/03/2016   CREATININE 0.70 04/04/2017   2) HTN- maintained on lisinopril 40mg . Reports that she is tolerating medication.  BP Readings from Last 3 Encounters:  09/13/17 (!) 154/66  04/04/17 140/76  03/05/17 (!) 159/63   3) Anemia-  Maintained on iron one tab once daily.  Lab Results  Component Value Date   WBC 4.2 04/04/2017   HGB 12.9 04/04/2017   HCT 39.2 04/04/2017   MCV 83.7 04/04/2017   PLT 183.0 04/04/2017   4) Back pain-  Reports that she continues low back pain with radicular symptoms down the left leg. Reports intermittent pain.  She has tried tylenol without improvement. Notes Bayer back and body helps.  She takes 1 tab bid.    Review of Systems See HPI  Past Medical History:  Diagnosis Date  . Arthritis   . Depression   . Gum disease   . Heart murmur   . History of syphilis   . Hypertension   . Irregular heart beat   . Personal history of colonic polyps   . Phlebitis   . Pre-diabetes      Social History   Socioeconomic History  . Marital status: Divorced    Spouse name: Not on file  . Number of children: 1  . Years of education: Not on file  . Highest education level: Not on file  Occupational History  . Not on file  Social Needs  . Financial resource strain: Not on file  . Food insecurity:    Worry: Not on file    Inability: Not on file  . Transportation needs:    Medical: Not on file    Non-medical: Not on file  Tobacco Use  . Smoking status: Former Smoker    Types: Cigarettes  . Smokeless tobacco: Never Used  Substance and Sexual Activity    . Alcohol use: No  . Drug use: No  . Sexual activity: Not on file  Lifestyle  . Physical activity:    Days per week: Not on file    Minutes per session: Not on file  . Stress: Not on file  Relationships  . Social connections:    Talks on phone: Not on file    Gets together: Not on file    Attends religious service: Not on file    Active member of club or organization: Not on file    Attends meetings of clubs or organizations: Not on file    Relationship status: Not on file  . Intimate partner violence:    Fear of current or ex partner: Not on file    Emotionally abused: Not on file    Physically abused: Not on file    Forced sexual activity: Not on file  Other Topics Concern  . 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 SHOULDER ARTHROSCOPY Right 12/2015  . SPINE SURGERY  08/20/2015   bulging / herniated disc in C-spine  . THERAPEUTIC ABORTION  1970. 1974, 1978    Family History  Problem Relation Age of Onset  . Diabetes Mother   . Cancer Father        lung cancer  . Kidney disease Father   . Seizures Father   . Cancer Brother        colon  . Heart attack Brother   . Stroke Brother   . Colon cancer Neg Hx   . Stomach cancer Neg Hx     Allergies  Allergen Reactions  . Aldactone [Spironolactone] Other (See Comments)    itching    Current Outpatient Medications on File Prior to Visit  Medication Sig Dispense Refill  . aspirin 81 MG chewable tablet Chew 81 mg by mouth daily.      . Calcium Carbonate-Vitamin D (CALTRATE 600+D) 600-400 MG-UNIT per tablet Take 1  tablet by mouth 2 (two) times daily.    . Ferrous Gluconate (IRON) 240 (27 FE) MG TABS Take 1 tablet by mouth daily.      Marland Kitchen lisinopril (PRINIVIL,ZESTRIL) 40 MG tablet Take 1 tablet (40 mg total) by mouth daily. 90 tablet 1   No current facility-administered medications on file prior to visit.     BP (!) 154/66 (BP Location: Right Arm, Cuff Size: Large)   Pulse 90   Temp 97.8 F (36.6 C) (Oral)   Resp 18   Ht 5\' 3"  (1.6 m)   Wt 194 lb (88 kg)   SpO2 100%   BMI 34.37 kg/m       Objective:   Physical Exam  Constitutional: She appears well-developed and well-nourished.  Cardiovascular: Normal rate, regular rhythm and normal heart sounds.  No murmur heard. Pulmonary/Chest: Effort normal and breath sounds normal. No respiratory distress. She has no wheezes.  Psychiatric: She has a normal mood and affect. Her behavior is normal. Judgment and thought content normal.          Assessment & Plan:  HTN- uncontrolled. Will add toprol xl to her regimen. Follow up in 1 month for nurse visit bp check.  DM2- discussed diet/exercise. Obtain follow up A1C.  Lumbar radiculopathy- advised pt d/c bayer, start meloxicam once daily as well as back exercises as outlined in AVS. Follow up if symptoms worsen or fail to improve.

## 2017-09-13 NOTE — Patient Instructions (Addendum)
Please complete lab work prior to leaving. Stop Bayer Back and Body, instead begin meloxicam once daily and back exercises as below. Call if symptoms worsen or if symptoms fail to improve.   Exercises The following exercises strengthen the muscles that help to support the back. They also help to keep the lower back flexible. Doing these exercises can help to prevent back pain or lessen existing pain. If you have back pain or discomfort, try doing these exercises 2-3 times each day or as told by your health care provider. When the pain goes away, do them once each day, but increase the number of times that you repeat the steps for each exercise (do more repetitions). If you do not have back pain or discomfort, do these exercises once each day or as told by your health care provider. Exercises Single Knee to Chest  Repeat these steps 3-5 times for each leg: 1. Lie on your back on a firm bed or the floor with your legs extended. 2. Bring one knee to your chest. Your other leg should stay extended and in contact with the floor. 3. Hold your knee in place by grabbing your knee or thigh. 4. Pull on your knee until you feel a gentle stretch in your lower back. 5. Hold the stretch for 10-30 seconds. 6. Slowly release and straighten your leg.  Pelvic Tilt  Repeat these steps 5-10 times: 1. Lie on your back on a firm bed or the floor with your legs extended. 2. Bend your knees so they are pointing toward the ceiling and your feet are flat on the floor. 3. Tighten your lower abdominal muscles to press your lower back against the floor. This motion will tilt your pelvis so your tailbone points up toward the ceiling instead of pointing to your feet or the floor. 4. With gentle tension and even breathing, hold this position for 5-10 seconds.  Cat-Cow  Repeat these steps until your lower back becomes more flexible: 1. Get into a hands-and-knees position on a firm surface. Keep your hands under your  shoulders, and keep your knees under your hips. You may place padding under your knees for comfort. 2. Let your head hang down, and point your tailbone toward the floor so your lower back becomes rounded like the back of a cat. 3. Hold this position for 5 seconds. 4. Slowly lift your head and point your tailbone up toward the ceiling so your back forms a sagging arch like the back of a cow. 5. Hold this position for 5 seconds.  Press-Ups  Repeat these steps 5-10 times: 1. Lie on your abdomen (face-down) on the floor. 2. Place your palms near your head, about shoulder-width apart. 3. While you keep your back as relaxed as possible and keep your hips on the floor, slowly straighten your arms to raise the top half of your body and lift your shoulders. Do not use your back muscles to raise your upper torso. You may adjust the placement of your hands to make yourself more comfortable. 4. Hold this position for 5 seconds while you keep your back relaxed. 5. Slowly return to lying flat on the floor.  Bridges  Repeat these steps 10 times: 1. Lie on your back on a firm surface. 2. Bend your knees so they are pointing toward the ceiling and your feet are flat on the floor. 3. Tighten your buttocks muscles and lift your buttocks off of the floor until your waist is at almost the same  height as your knees. You should feel the muscles working in your buttocks and the back of your thighs. If you do not feel these muscles, slide your feet 1-2 inches farther away from your buttocks. 4. Hold this position for 3-5 seconds. 5. Slowly lower your hips to the starting position, and allow your buttocks muscles to relax completely.  If this exercise is too easy, try doing it with your arms crossed over your chest. Abdominal Crunches  Repeat these steps 5-10 times: 1. Lie on your back on a firm bed or the floor with your legs extended. 2. Bend your knees so they are pointing toward the ceiling and your feet are  flat on the floor. 3. Cross your arms over your chest. 4. Tip your chin slightly toward your chest without bending your neck. 5. Tighten your abdominal muscles and slowly raise your trunk (torso) high enough to lift your shoulder blades a tiny bit off of the floor. Avoid raising your torso higher than that, because it can put too much stress on your low back and it does not help to strengthen your abdominal muscles. 6. Slowly return to your starting position.  Back Lifts Repeat these steps 5-10 times: 1. Lie on your abdomen (face-down) with your arms at your sides, and rest your forehead on the floor. 2. Tighten the muscles in your legs and your buttocks. 3. Slowly lift your chest off of the floor while you keep your hips pressed to the floor. Keep the back of your head in line with the curve in your back. Your eyes should be looking at the floor. 4. Hold this position for 3-5 seconds. 5. Slowly return to your starting position.  Contact a health care provider if:  Your back pain or discomfort gets much worse when you do an exercise.  Your back pain or discomfort does not lessen within 2 hours after you exercise. If you have any of these problems, stop doing these exercises right away. Do not do them again unless your health care provider says that you can. Get help right away if:  You develop sudden, severe back pain. If this happens, stop doing the exercises right away. Do not do them again unless your health care provider says that you can. This information is not intended to replace advice given to you by your health care provider. Make sure you discuss any questions you have with your health care provider. Document Released: 03/23/2004 Document Revised: 06/23/2015 Document Reviewed: 04/09/2014 Elsevier Interactive Patient Education  2017 [employer].

## 2017-09-19 ENCOUNTER — Telehealth: Payer: Self-pay | Admitting: *Deleted

## 2017-09-19 NOTE — Telephone Encounter (Signed)
Request for most recent eye exam faxed to [phone].

## 2017-10-14 ENCOUNTER — Other Ambulatory Visit: Payer: Self-pay | Admitting: Family

## 2017-10-15 MED ORDER — MELOXICAM 7.5 MG PO TABS: 7.5000 mg | ORAL_TABLET | Freq: Every day | ORAL | 0 refills | Status: DC

## 2017-10-15 NOTE — Addendum Note (Signed)
Addended by: Kelle Darting A on: 10/15/2017 01:25 PM   Modules accepted: Orders

## 2017-10-16 ENCOUNTER — Ambulatory Visit (INDEPENDENT_AMBULATORY_CARE_PROVIDER_SITE_OTHER): Payer: Medicare Other | Admitting: Family Medicine

## 2017-10-16 VITALS — BP 148/82 | HR 70

## 2017-10-16 DIAGNOSIS — I10 Essential (primary) hypertension: Secondary | ICD-10-CM | POA: Diagnosis not present

## 2017-10-16 NOTE — Progress Notes (Addendum)
Pre visit review using our clinic tool,if applicable. No additional management support is needed unless otherwise documented below in the visit note.   Pt here for Blood pressure check per   Pt currently takes: Lisinopril 40 mg daily and Metoprolol Suc. 25 mg daily has had Lisinopril but states she is out of Metoprolol. Has only been back on Metoprolol for 30 days.States she has ordered refill.   BP today @ = 148/82 HR = 70  Pt advised per per Dr. Carollee Herter take medications as ordered and return in 1 week for BP check.Patient agreed. Appointment scheduled.   Reviewed-- Ann Held, DO

## 2017-10-26 ENCOUNTER — Ambulatory Visit (INDEPENDENT_AMBULATORY_CARE_PROVIDER_SITE_OTHER): Payer: Medicare Other | Admitting: Family

## 2017-10-26 VITALS — BP 156/81 | HR 72

## 2017-10-26 DIAGNOSIS — I10 Essential (primary) hypertension: Secondary | ICD-10-CM

## 2017-10-26 MED ORDER — AMLODIPINE BESYLATE 5 MG PO TABS: 5.0000 mg | ORAL_TABLET | Freq: Every day | ORAL | 2 refills | Status: DC

## 2017-10-26 NOTE — Progress Notes (Signed)
Reviewed and agree.  Chioke Noxon S O'Sullivan NP 

## 2017-10-26 NOTE — Progress Notes (Signed)
Pre visit review using our clinic tool,if applicable. No additional management support is needed unless otherwise documented below in the visit note.   Pt here for Blood pressure check per order from Dr. Carollee Herter dated 10/16/17  Pt currently takes:Lisinopril 40 mg and Metoprolol Suc 25 mg daily.   Pt reports compliance with medication. States she has had some family stressors but nothing major. Denies chest pain/ tightness, SOB.   BP today @ = 156/81 P = 72  Pt advised per [name] add Amlodipine 5 mg daily #30 RF 2 and follow up with provider in 2 weeks.

## 2017-11-09 ENCOUNTER — Ambulatory Visit (INDEPENDENT_AMBULATORY_CARE_PROVIDER_SITE_OTHER): Payer: Medicare Other | Admitting: Family

## 2017-11-09 VITALS — BP 136/79 | HR 71

## 2017-11-09 DIAGNOSIS — I10 Essential (primary) hypertension: Secondary | ICD-10-CM

## 2017-11-09 NOTE — Progress Notes (Signed)
Pre visit review using our clinic tool,if applicable. No additional management support is needed unless otherwise documented below in the visit note.    Pt here for Blood pressure check per order from M.. Inda Castle. NP  Pt currently tPakes:   Pt reports compliance with medication.  BP today = 136/79 P = 69  Pt advised per Elby Beck NP to continue Liisinopril 40 mg, Metoprolol 25 mg and Amlodipine 5 mg to control BP. Return for follow up visit with provider in 3 months. Patient agreed.  Marland Kitchen

## 2017-11-10 NOTE — Progress Notes (Signed)
Ammi Hutt S O'Sullivan NP 

## 2017-12-12 ENCOUNTER — Other Ambulatory Visit: Payer: Self-pay | Admitting: Family

## 2017-12-12 MED ORDER — LISINOPRIL 40 MG PO TABS: 40.0000 mg | ORAL_TABLET | Freq: Every day | ORAL | 1 refills | Status: DC

## 2017-12-12 NOTE — Telephone Encounter (Signed)
Copied from [place] [id number]. Topic: Quick Communication - Rx Refill/Question >> Dec 12, 2017  4:30 PM Bea Graff, NT wrote: Medication: lisinopril (PRINIVIL,ZESTRIL) 40 MG tablet   Has the patient contacted their pharmacy? Yes.   (Agent: If no, request that the patient contact the pharmacy for the refill.) (Agent: If yes, when and what did the pharmacy advise?)  Preferred Pharmacy (with phone number or street name): [facility] ([address] [place] [phone] (Phone) [phone] (Fax)    Agent: Please be advised that RX refills may take up to 3 business days. We ask that you follow-up with your pharmacy.

## 2017-12-14 ENCOUNTER — Other Ambulatory Visit: Payer: Self-pay | Admitting: Family

## 2017-12-14 MED ORDER — MELOXICAM 7.5 MG PO TABS: 7.5000 mg | ORAL_TABLET | Freq: Every day | ORAL | 0 refills | Status: DC

## 2017-12-14 MED ORDER — METOPROLOL SUCCINATE ER 25 MG PO TB24: 25.0000 mg | ORAL_TABLET | Freq: Every day | ORAL | 3 refills | Status: DC

## 2017-12-14 NOTE — Telephone Encounter (Signed)
Copied from [place] [id number]. Topic: Quick Communication - Rx Refill/Question >> Dec 14, 2017 12:53 PM Burchel, Abbi R wrote: Medication: metoprolol succinate (TOPROL-XL) 25 MG 24 hr tablet   Preferred Pharmacy: [facility] [address] ([address] [place] [address] [place] ([place]) [place] [address] Phone: [phone] Fax: [phone]  Pt was advised that RX refills may take up to 3 business days. We ask that you follow-up with your pharmacy.

## 2018-01-13 ENCOUNTER — Other Ambulatory Visit: Payer: Self-pay | Admitting: Family

## 2018-01-25 ENCOUNTER — Other Ambulatory Visit: Payer: Self-pay | Admitting: Family

## 2018-02-08 ENCOUNTER — Encounter: Payer: Self-pay | Admitting: Family

## 2018-02-08 ENCOUNTER — Ambulatory Visit (INDEPENDENT_AMBULATORY_CARE_PROVIDER_SITE_OTHER): Payer: Medicare Other | Admitting: Family

## 2018-02-08 VITALS — BP 132/55 | HR 80 | Temp 98.1°F | Resp 16 | Ht 63.0 in | Wt 192.0 lb

## 2018-02-08 DIAGNOSIS — M1990 Unspecified osteoarthritis, unspecified site: Secondary | ICD-10-CM

## 2018-02-08 DIAGNOSIS — Z86010 Personal history of colonic polyps: Secondary | ICD-10-CM

## 2018-02-08 DIAGNOSIS — Z23 Encounter for immunization: Secondary | ICD-10-CM | POA: Diagnosis not present

## 2018-02-08 DIAGNOSIS — I10 Essential (primary) hypertension: Secondary | ICD-10-CM

## 2018-02-08 DIAGNOSIS — E119 Type 2 diabetes mellitus without complications: Secondary | ICD-10-CM | POA: Diagnosis not present

## 2018-02-08 LAB — HEMOGLOBIN A1C
Hgb A1c MFr Bld: 6.3 % of total Hgb — ABNORMAL HIGH (ref <5.7)
MEAN PLASMA GLUCOSE: 134 (calc)
eAG (mmol/L): 7.4 (calc)

## 2018-02-08 LAB — BASIC METABOLIC PANEL
BUN: 12 mg/dL (ref 7–25)
CO2: 24 mmol/L (ref 20–32)
Calcium: 9.3 mg/dL (ref 8.6–10.4)
Chloride: 107 mmol/L (ref 98–110)
Creat: 0.72 mg/dL (ref 0.50–0.99)
Glucose, Bld: 99 mg/dL (ref 65–99)
Potassium: 3.8 mmol/L (ref 3.5–5.3)
Sodium: 140 mmol/L (ref 135–146)

## 2018-02-08 MED ORDER — LISINOPRIL 40 MG PO TABS: 40.0000 mg | ORAL_TABLET | Freq: Every day | ORAL | 1 refills | Status: DC

## 2018-02-08 MED ORDER — MELOXICAM 7.5 MG PO TABS: 7.5000 mg | ORAL_TABLET | Freq: Every day | ORAL | 1 refills | Status: DC

## 2018-02-08 MED ORDER — METOPROLOL SUCCINATE ER 25 MG PO TB24: 25.0000 mg | ORAL_TABLET | Freq: Every day | ORAL | 1 refills | Status: DC

## 2018-02-08 MED ORDER — AMLODIPINE BESYLATE 5 MG PO TABS: 5.0000 mg | ORAL_TABLET | Freq: Every day | ORAL | 1 refills | Status: DC

## 2018-02-08 NOTE — Patient Instructions (Signed)
Please complete lab work prior to leaving.   

## 2018-02-08 NOTE — Progress Notes (Signed)
Subjective:    Patient ID: Cassidy Harrell, female    DOB: 10-13-1950, 67 y.o.   MRN: [id number]  HPI  Patient is a 67 yr old female who presents today for follow up.  HTN- maintained on lisinopril, amlodipine and metoprolol.  BP Readings from Last 3 Encounters:  02/08/18 (!) 132/55  11/09/17 136/79  10/26/17 (!) 156/81   DM2- maintained on diet alone.  Wt Readings from Last 3 Encounters:  02/08/18 192 lb (87.1 kg)  09/13/17 194 lb (88 kg)  04/04/17 195 lb (88.5 kg)    Lab Results  Component Value Date   HGBA1C 6.7 (H) 09/13/2017   HGBA1C 6.6 (H) 04/04/2017   HGBA1C 6.0 (H) 10/03/2016   Lab Results  Component Value Date   MICROALBUR 0.7 09/16/2014   LDLCALC 13 10/03/2016   CREATININE 0.76 09/13/2017    Osteoarthritis- reports that she continues meloxicam once daily which really helps with her pain.   Review of Systems    see HPI  Past Medical History:  Diagnosis Date  . Arthritis   . Depression   . Gum disease   . Heart murmur   . History of syphilis   . Hypertension   . Irregular heart beat   . Personal history of colonic polyps   . Phlebitis   . Pre-diabetes      Social History   Socioeconomic History  . Marital status: Divorced    Spouse name: Not on file  . Number of children: 1  . Years of education: Not on file  . Highest education level: Not on file  Occupational History  . Not on file  Social Needs  . Financial resource strain: Not on file  . Food insecurity:    Worry: Not on file    Inability: Not on file  . Transportation needs:    Medical: Not on file    Non-medical: Not on file  Tobacco Use  . Smoking status: Former Smoker    Types: Cigarettes  . Smokeless tobacco: Never Used  Substance and Sexual Activity  . Alcohol use: No  . Drug use: No  . Sexual activity: Not on file  Lifestyle  . Physical activity:    Days per week: Not on file    Minutes per session: Not on file  . Stress: Not on file  Relationships  . Social  connections:    Talks on phone: Not on file    Gets together: Not on file    Attends religious service: Not on file    Active member of club or organization: Not on file    Attends meetings of clubs or organizations: Not on file    Relationship status: Not on file  . Intimate partner violence:    Fear of current or ex partner: Not on file    Emotionally abused: Not on file    Physically abused: Not on file    Forced sexual activity: Not on file  Other Topics Concern  . 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 SHOULDER ARTHROSCOPY Right 12/2015  . SPINE SURGERY  08/20/2015   bulging / herniated disc in C-spine  . THERAPEUTIC ABORTION  1970. 1974, 1978    Family History  Problem Relation Age of Onset  . Diabetes Mother   . Cancer Father        lung cancer  . Kidney disease Father   . Seizures Father   . Cancer Brother        colon  . Heart attack Brother   . Stroke Brother   . Colon cancer Neg Hx   . Stomach cancer Neg Hx     Allergies  Allergen Reactions  . Aldactone [Spironolactone] Other (See Comments)    itching    Current Outpatient Medications on File Prior to Visit  Medication Sig Dispense Refill  . aspirin 81 MG chewable tablet Chew 81 mg by mouth daily.      . Calcium Carbonate-Vitamin D (CALTRATE 600+D) 600-400 MG-UNIT per tablet Take 1 tablet by mouth 2 (two) times daily.    . Ferrous Gluconate (IRON) 240 (27 FE) MG TABS Take 1 tablet by mouth daily.       No current facility-administered medications on file prior to visit.     BP (!) 132/55 (BP  Location: Right Arm, Patient Position: Sitting, Cuff Size: Large)   Pulse 80   Temp 98.1 F (36.7 C) (Oral)   Resp 16   Ht 5\' 3"  (1.6 m)   Wt 192 lb (87.1 kg)   SpO2 100%   BMI 34.01 kg/m     Objective:   Physical Exam Constitutional:      Appearance: She is well-developed.  Neck:     Musculoskeletal: Neck supple.     Thyroid: No thyromegaly.  Cardiovascular:     Rate and Rhythm: Normal rate and regular rhythm.     Heart sounds: Normal heart sounds. No murmur.  Pulmonary:     Effort: Pulmonary effort is normal. No respiratory distress.     Breath sounds: Normal breath sounds. No wheezing.  Musculoskeletal:       Arms:     Comments: 2+ bilateral LE edema  Skin:    General: Skin is warm and dry.  Neurological:     Mental Status: She is alert and oriented to person, place, and time.  Psychiatric:        Behavior: Behavior normal.        Thought Content: Thought content normal.        Judgment: Judgment normal.           Assessment & Plan:  HTN- bp is stable on current meds, continue same.   DM2-  Lab Results  Component Value Date   HGBA1C 6.3 (H) 02/08/2018   Follow up A1C is stable/improved. We discussed diabetic diet/exercise/weight loss.  Osteoarthritis- stable with meloxicam, continue same.   She is past due for colonoscopy- will order.

## 2018-02-09 ENCOUNTER — Encounter: Payer: Self-pay | Admitting: Family

## 2018-02-12 ENCOUNTER — Other Ambulatory Visit: Payer: Self-pay | Admitting: Family

## 2018-03-11 ENCOUNTER — Other Ambulatory Visit (HOSPITAL_BASED_OUTPATIENT_CLINIC_OR_DEPARTMENT_OTHER): Payer: Self-pay | Admitting: Family

## 2018-03-11 DIAGNOSIS — Z1231 Encounter for screening mammogram for malignant neoplasm of breast: Secondary | ICD-10-CM

## 2018-03-26 LAB — HM DIABETES EYE EXAM

## 2018-04-30 ENCOUNTER — Ambulatory Visit (HOSPITAL_BASED_OUTPATIENT_CLINIC_OR_DEPARTMENT_OTHER)
Admission: RE | Admit: 2018-04-30 | Discharge: 2018-04-30 | Disposition: A | Discharge status: Home / Self Care | Payer: Medicare Other | Source: Ambulatory Visit | Attending: Family | Admitting: Family

## 2018-04-30 DIAGNOSIS — Z1231 Encounter for screening mammogram for malignant neoplasm of breast: Secondary | ICD-10-CM | POA: Diagnosis present

## 2018-05-17 ENCOUNTER — Encounter: Payer: Medicare Other | Admitting: Family

## 2018-06-02 ENCOUNTER — Telehealth: Payer: Self-pay | Admitting: Family

## 2018-06-02 NOTE — Telephone Encounter (Signed)
Please contact pt to schedule an annual wellness visit with me.

## 2018-06-03 ENCOUNTER — Ambulatory Visit (INDEPENDENT_AMBULATORY_CARE_PROVIDER_SITE_OTHER): Payer: Medicare Other | Admitting: Family

## 2018-06-03 ENCOUNTER — Other Ambulatory Visit: Payer: Self-pay

## 2018-06-03 DIAGNOSIS — Z0000 Encounter for general adult medical examination without abnormal findings: Secondary | ICD-10-CM

## 2018-06-03 MED ORDER — MELOXICAM 7.5 MG PO TABS: 7.5000 mg | ORAL_TABLET | Freq: Every day | ORAL | 2 refills | Status: DC

## 2018-06-03 NOTE — Telephone Encounter (Signed)
[email or web address]

## 2018-06-03 NOTE — Progress Notes (Signed)
Subjective:    Cassidy Harrell is a 68 y.o. female who presents for Medicare Annual/Subsequent preventive examination.  Virtual Visit via Video Note  I connected with Morley Kos on 06/04/18 at  2:00 PM EDT by a video enabled telemedicine application and verified that I am speaking with the correct person using two identifiers. This visit type was conducted due to national recommendations for restrictions regarding the COVID-19 Pandemic (e.g. social distancing).  This format is felt to be most appropriate for this patient at this time.   I discussed the limitations of evaluation and management by telemedicine and the availability of in person appointments. The patient expressed understanding and agreed to proceed.  Only the patient and myself were on today's video visit. The patient was at home and I was in my office at the time of today's visit.    Preventive Screening-Counseling & Management   HTN- maintained on amlodipine 5mg , toprol xl 25mg .   BP Readings from Last 3 Encounters:  02/08/18 (!) 132/55  11/09/17 136/79  10/26/17 (!) 156/81   Leg pain-reports pain shoots down the back of the left leg. Similar to previous pain and was helped previously by meloxicam.  Tobacco Social History   Tobacco Use  Smoking Status Former Smoker  . Types: Cigarettes  Smokeless Tobacco Never Used     Problems Prior to Visit 1. HTN- maintained on amlodipine 5mg , lisinopril 40mg .    BP Readings from Last 3 Encounters:  02/08/18 (!) 132/55  11/09/17 136/79  10/26/17 (!) 156/81     Current Problems (verified) Patient Active Problem List   Diagnosis Date Noted  . Chest pain 06/30/2015  . Heart palpitations 06/30/2015  . Hypokalemia 06/07/2015  . Cellulitis and abscess of trunk 11/05/2014  . Myalgia and myositis 05/28/2014  . Allergic rhinitis 05/28/2014  . Thyromegaly 11/24/2013  . Foot pain 11/24/2013  . Anemia, iron deficiency 08/25/2013  . Diabetes type 2, controlled ([place])  08/25/2013  . Other malaise and fatigue 10/14/2012  . Cervical disc disease 06/06/2012  . Routine general medical examination at a health care facility 11/13/2011  . Hypothyroid 10/24/2011  . Arthritis 09/25/2011  . Depression 09/25/2011  . History of syphilis 09/25/2011  . HTN (hypertension) 09/25/2011    Medications Prior to Visit Current Outpatient Medications on File Prior to Visit  Medication Sig Dispense Refill  . amLODipine (NORVASC) 5 MG tablet Take 1 tablet (5 mg total) by mouth daily. 90 tablet 1  . aspirin 81 MG chewable tablet Chew 81 mg by mouth daily.      . Calcium Carbonate-Vitamin D (CALTRATE 600+D) 600-400 MG-UNIT per tablet Take 1 tablet by mouth 2 (two) times daily.    . Ferrous Gluconate (IRON) 240 (27 FE) MG TABS Take 1 tablet by mouth daily.      Marland Kitchen lisinopril (PRINIVIL,ZESTRIL) 40 MG tablet Take 1 tablet (40 mg total) by mouth daily. 90 tablet 1  . meloxicam (MOBIC) 7.5 MG tablet TAKE 1 TABLET BY MOUTH ONCE DAILY 30 tablet 0  . metoprolol succinate (TOPROL-XL) 25 MG 24 hr tablet Take 1 tablet (25 mg total) by mouth daily. 90 tablet 1   No current facility-administered medications on file prior to visit.     Current Medications (verified) Current Outpatient Medications  Medication Sig Dispense Refill  . amLODipine (NORVASC) 5 MG tablet Take 1 tablet (5 mg total) by mouth daily. 90 tablet 1  . aspirin 81 MG chewable tablet Chew 81 mg by mouth daily.      Marland Kitchen  Calcium Carbonate-Vitamin D (CALTRATE 600+D) 600-400 MG-UNIT per tablet Take 1 tablet by mouth 2 (two) times daily.    . Ferrous Gluconate (IRON) 240 (27 FE) MG TABS Take 1 tablet by mouth daily.      Marland Kitchen lisinopril (PRINIVIL,ZESTRIL) 40 MG tablet Take 1 tablet (40 mg total) by mouth daily. 90 tablet 1  . meloxicam (MOBIC) 7.5 MG tablet TAKE 1 TABLET BY MOUTH ONCE DAILY 30 tablet 0  . metoprolol succinate (TOPROL-XL) 25 MG 24 hr tablet Take 1 tablet (25 mg total) by mouth daily. 90 tablet 1   No current  facility-administered medications for this visit.      Allergies (verified) Aldactone [spironolactone]   PAST HISTORY  Family History Family History  Problem Relation Age of Onset  . Diabetes Mother   . Cancer Father        lung cancer  . Kidney disease Father   . Seizures Father   . Cancer Brother        colon  . Heart attack Brother   . Stroke Brother   . Colon cancer Neg Hx   . Stomach cancer Neg Hx     Social History Social History   Tobacco Use  . Smoking status: Former Smoker    Types: Cigarettes  . Smokeless tobacco: Never Used  Substance Use Topics  . Alcohol use: No     Are there smokers in your home (other than you)? No  Risk Factors Current exercise habits: minimal walking Dietary issues discussed: continue healthy diet, exercise, weight loss efforts  Cardiac risk factors: advanced age (older than 91 for men, 90 for women) and hypertension.  Depression Screen (Note: if answer to either of the following is "Yes", a more complete depression screening is indicated)   Over the past two weeks, have you felt down, depressed or hopeless? No  Over the past two weeks, have you felt little interest or pleasure in doing things? No  Have you lost interest or pleasure in daily life? No  Do you often feel hopeless? No  Do you cry easily over simple problems? No  Activities of Daily Living In your present state of health, do you have any difficulty performing the following activities?:  Driving? No Managing money?  No Feeding yourself? No Getting from bed to chair? No . Climbing a flight of stairs? No Preparing food and eating?: No Bathing or showering? No Getting dressed: No Getting to the toilet? No Using the toilet:No Moving around from place to place: No In the past year have you fallen or had a near fall?:No   Are you sexually active?  No  Do you have more than one partner?  No  Hearing Difficulties: No Do you often ask people to speak up or  repeat themselves? No Do you experience ringing or noises in your ears? No Do you have difficulty understanding soft or whispered voices? No   Do you feel that you have a problem with memory? No  Do you often misplace items? No  Do you feel safe at home?  Yes  Cognitive Testing  Alert? yes Normal Appearance? yes  Oriented to person? Yes  Place? Yes   Time? Yes  Recall of three objects?  Yes  Can perform simple calculations? Yes  Displays appropriate judgment?Yes  Can read the correct time from a watch face?Yes   Advanced Directives have been discussed with the patient? Yes  List the Names of Other Physician/Practitioners you currently use: 1.  Indicate any recent Medical Services you may have received from other than [facility] providers in the past year (date may be approximate).  Immunization History  Administered Date(s) Administered  . Influenza Split 11/13/2011  . Influenza, High Dose Seasonal PF 03/10/2016, 01/02/2017, 02/08/2018  . Influenza,inj,Quad PF,6+ Mos 11/24/2013  . Influenza-Unspecified 11/27/2012  . Pneumococcal Conjugate-13 04/04/2017  . Pneumococcal Polysaccharide-23 11/24/2013  . Tdap 04/01/2013  . Zoster 05/28/2015    Screening Tests Health Maintenance  Topic Date Due  . COLONOSCOPY  12/19/2016  . HEMOGLOBIN A1C  08/10/2018  . FOOT EXAM  09/14/2018  . INFLUENZA VACCINE  09/28/2018  . PNA vac Low Risk Adult (2 of 2 - PPSV23) 11/25/2018  . OPHTHALMOLOGY EXAM  03/27/2019  . MAMMOGRAM  04/30/2019  . TETANUS/TDAP  04/02/2023  . DEXA SCAN  Completed  . Hepatitis C Screening  Completed    All answers were reviewed with the patient and necessary referrals were made:  [name], NP   06/03/2018   History reviewed: allergies, current medications, past family history, past medical history, past social history, past surgical history and problem list  Review of Systems Pertinent items are noted in HPI.    Objective:      Gen: Awake, alert,  no acute distress Resp: Breathing is even and non-labored Psych: calm/pleasant demeanor Neuro: Alert and Oriented x 3, + facial symmetry, speech is clear.   There is no height or weight on file to calculate BMI. There were no vitals taken for this visit.       Assessment:      Plan:    I will reach out to her GI provider to see if they would like a 5 or 10 year follow up colo.   During the course of the visit the patient was educated and counseled about appropriate screening and preventive services including:    Bone densitometry screening  Colorectal cancer screening  Advanced directives: patient has an advanced directive  Diet review for nutrition referral? Yes ____  Not Indicated __x__   Patient Instructions (the written plan) was given to the patient.  Medicare Attestation I have personally reviewed: The patient's medical and social history Their use of alcohol, tobacco or illicit drugs Their current medications and supplements The patient's functional ability including ADLs,fall risks, home safety risks, cognitive, and hearing and visual impairment Diet and physical activities Evidence for depression or mood disorders  The patient's weight, height, BMI, and visual acuity have been recorded in the chart.  I have made referrals, counseling, and provided education to the patient based on review of the above and I have provided the patient with a written personalized care plan for preventive services.     [name], NP   06/03/2018

## 2018-06-03 NOTE — Telephone Encounter (Signed)
PATIENT SCHEDULED FOR 2PM TODAY

## 2018-06-07 ENCOUNTER — Telehealth: Payer: Self-pay | Admitting: Family

## 2018-06-07 NOTE — Telephone Encounter (Signed)
Copied from [place] [id number]. Topic: Quick Communication - Rx Refill/Question >> Jun 07, 2018  3:33 PM [place], [place] D wrote: Medication: meloxicam (MOBIC) 7.5 MG tablet / Pt stated she was prescribed meloxicam due to pain from sciatica however she discovered that she was already taking it and has been taking it twice a day instead of once per day. Please advise.  Has the patient contacted their pharmacy? No. (Agent: If no, request that the patient contact the pharmacy for the refill.) (Agent: If yes, when and what did the pharmacy advise?)  Preferred Pharmacy (with phone number or street name): [facility] ([address]), [place] [phone] (Phone) [phone] (Fax)    Agent: Please be advised that RX refills may take up to 3 business days. We ask that you follow-up with your pharmacy.

## 2018-06-10 NOTE — Telephone Encounter (Signed)
Please advise pt to decrease meloxicam to 1 tab once daily as needed.  How bad is her sciatica pain?  If symptoms are moderate to severe I will send an rx for medrol dose pak. (steroid medication).

## 2018-06-10 NOTE — Telephone Encounter (Signed)
Patient advised to take Meloxicam once a day only. She reports her pain is not mod or severe. She still has "some pain and hard to lift leg or to sit on hard surfaces"

## 2018-06-11 ENCOUNTER — Telehealth: Payer: Self-pay | Admitting: Family

## 2018-06-11 NOTE — Telephone Encounter (Signed)
See mychart.  

## 2018-06-11 NOTE — Telephone Encounter (Signed)
-----   Message from Milus Banister, MD sent at 06/11/2018  6:20 AM EDT ----- Sorry for the delay.    The 10 year recommendation is correct based on newer guidelines (basically 1-2 small adenomas, less than 1cm, followed by a normal examination 5 years later reverts the patient back to routine risk with 10 year follow up.).  thanks for checking  Dj ----- Message ----- From: [name], NP Sent: 06/04/2018  11:52 AM EDT To: Milus Banister, MD  [place] you are hanging in there!  Just wanted to double check. Your recall letter said 5 yr follow up but your office told her 10 years when we tried to set it up.    Thanks,  [employer]

## 2018-09-03 ENCOUNTER — Other Ambulatory Visit: Payer: Self-pay

## 2018-09-03 ENCOUNTER — Ambulatory Visit (INDEPENDENT_AMBULATORY_CARE_PROVIDER_SITE_OTHER): Payer: Medicare Other | Admitting: Family

## 2018-09-03 ENCOUNTER — Encounter: Payer: Self-pay | Admitting: Family

## 2018-09-03 VITALS — BP 143/64 | HR 79 | Temp 98.2°F | Resp 16 | Ht 63.0 in | Wt 190.0 lb

## 2018-09-03 DIAGNOSIS — M5416 Radiculopathy, lumbar region: Secondary | ICD-10-CM

## 2018-09-03 NOTE — Progress Notes (Signed)
Subjective:    Patient ID: Cassidy Harrell, female    DOB: Mar 23, 1950, 68 y.o.   MRN: [id number]  HPI  Patient is a 68 yr old female who presents today with chief complaint of bilateral leg pain L>R.    Patient reports bilateral knee pain.  Also having left ankle pain.  Has a burning pain left lateral calf as well as low back pain.  Pain is worse when she is cold.  Pain radiates down the buttock.  Pain has been present for greater than 6 months.  She reports only slight improvement with use of meloxicam.   Review of Systems  See HPI  Past Medical History:  Diagnosis Date  . Arthritis   . Depression   . Gum disease   . Heart murmur   . History of syphilis   . Hypertension   . Irregular heart beat   . Personal history of colonic polyps   . Phlebitis   . Pre-diabetes      Social History   Socioeconomic History  . Marital status: Divorced    Spouse name: Not on file  . Number of children: 1  . Years of education: Not on file  . Highest education level: Not on file  Occupational History  . Not on file  Social Needs  . Financial resource strain: Not on file  . Food insecurity    Worry: Not on file    Inability: Not on file  . Transportation needs    Medical: Not on file    Non-medical: Not on file  Tobacco Use  . Smoking status: Former Smoker    Types: Cigarettes  . Smokeless tobacco: Never Used  Substance and Sexual Activity  . Alcohol use: No  . Drug use: No  . Sexual activity: Not on file  Lifestyle  . Physical activity    Days per week: Not on file    Minutes per session: Not on file  . Stress: Not on file  Relationships  . Social Herbalist on phone: Not on file    Gets together: Not on file    Attends religious service: Not on file    Active member of club or organization: Not on file    Attends meetings of clubs or organizations: Not on file    Relationship status: Not on file  . Intimate partner violence    Fear of current or ex  partner: Not on file    Emotionally abused: Not on file    Physically abused: Not on file    Forced sexual activity: Not on file  Other Topics Concern  . 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 SHOULDER ARTHROSCOPY Right 12/2015  . SPINE SURGERY  08/20/2015   bulging / herniated disc in C-spine  . THERAPEUTIC ABORTION  1970. 1974, 1978    Family History  Problem Relation Age of Onset  . Diabetes Mother   . Cancer Father  lung cancer  . Kidney disease Father   . Seizures Father   . Cancer Brother        colon  . Heart attack Brother   . Stroke Brother   . Colon cancer Neg Hx   . Stomach cancer Neg Hx     Allergies  Allergen Reactions  . Aldactone [Spironolactone] Other (See Comments)    itching    Current Outpatient Medications on File Prior to Visit  Medication Sig Dispense Refill  . amLODipine (NORVASC) 5 MG tablet Take 1 tablet (5 mg total) by mouth daily. 90 tablet 1  . aspirin 81 MG chewable tablet Chew 81 mg by mouth daily.      . Calcium Carbonate-Vitamin D (CALTRATE 600+D) 600-400 MG-UNIT per tablet Take 1 tablet by mouth 2 (two) times daily.    . Ferrous Gluconate (IRON) 240 (27 FE) MG TABS Take 1 tablet by mouth daily.      Marland Kitchen lisinopril (PRINIVIL,ZESTRIL) 40 MG tablet Take 1 tablet (40 mg total) by mouth daily. 90 tablet 1  . meloxicam (MOBIC) 7.5 MG tablet Take 1 tablet (7.5 mg total) by mouth daily. 30 tablet 2  . metoprolol succinate (TOPROL-XL) 25 MG 24 hr tablet Take 1 tablet (25 mg total) by mouth daily. 90 tablet 1   No current  facility-administered medications on file prior to visit.     BP (!) 143/64 (BP Location: Right Arm, Patient Position: Sitting, Cuff Size: Large)   Pulse 79   Temp 98.2 F (36.8 C) (Oral)   Resp 16   Ht 5\' 3"  (1.6 m)   Wt 190 lb (86.2 kg)   SpO2 100%   BMI 33.66 kg/m       Objective:   Physical Exam Constitutional:      Appearance: She is well-developed.  Cardiovascular:     Rate and Rhythm: Normal rate and regular rhythm.     Heart sounds: Normal heart sounds. No murmur.  Pulmonary:     Effort: Pulmonary effort is normal. No respiratory distress.     Breath sounds: Normal breath sounds. No wheezing.  Musculoskeletal:     Right lower leg: 2+ Edema present.     Left lower leg: 2+ Edema present.  Psychiatric:        Behavior: Behavior normal.        Thought Content: Thought content normal.        Judgment: Judgment normal.   Neurological-bilateral lower extremity strength is 5 out of 5        Assessment & Plan:  1) lumbar radiculopathy-Uncontrolled. Will refer for MRI of the lumbar spine due to ongoing radicular pain for right greater than 6 months despite conservative treatment.  Further recommendations pending review of MRI.  Continue meloxicam for now.

## 2018-09-03 NOTE — Patient Instructions (Signed)
You should be contacted about scheduling your MRI to further evaluate your spine.

## 2018-09-18 ENCOUNTER — Other Ambulatory Visit: Payer: Self-pay

## 2018-09-18 MED ORDER — LISINOPRIL 40 MG PO TABS: 40.0000 mg | ORAL_TABLET | Freq: Every day | ORAL | 1 refills | Status: DC

## 2018-09-29 ENCOUNTER — Ambulatory Visit
Admission: RE | Admit: 2018-09-29 | Discharge: 2018-09-29 | Disposition: A | Discharge status: Home / Self Care | Payer: Medicare Other | Source: Ambulatory Visit | Attending: Family | Admitting: Family

## 2018-09-29 ENCOUNTER — Other Ambulatory Visit: Payer: Self-pay

## 2018-09-29 DIAGNOSIS — M5416 Radiculopathy, lumbar region: Secondary | ICD-10-CM

## 2018-09-30 ENCOUNTER — Telehealth: Payer: Self-pay | Admitting: Family

## 2018-09-30 DIAGNOSIS — N9489 Other specified conditions associated with female genital organs and menstrual cycle: Secondary | ICD-10-CM

## 2018-09-30 DIAGNOSIS — M5441 Lumbago with sciatica, right side: Secondary | ICD-10-CM

## 2018-09-30 DIAGNOSIS — G8929 Other chronic pain: Secondary | ICD-10-CM

## 2018-09-30 NOTE — Telephone Encounter (Signed)
Also, the radiologist saw what looks like an ovarian cyst.  They recommend an ultrasound to take a closer look. I have placed the ultrasound order.

## 2018-09-30 NOTE — Telephone Encounter (Signed)
Please contact pt and let her know that I have reviewed her MRI. It shows that she has a lot of arthritis in her spine, some bulging discs and some pinched nerves.  I would like to refer her to see neurosurgery for consultation.  If she develops increased weakness, numbness, bowel/bladder incontinence she needs to go to the ER in the meantime.

## 2018-09-30 NOTE — Telephone Encounter (Signed)
FYI patient has seen Dr. Armando Gang neurosurgeon in the past.   All results given to patient and advised to go to the er if any of the symptoms mentioned by provider. She was informed she will get a phone call from radiology to set up ultra sound due to possible ovarian cyst.

## 2018-10-01 ENCOUNTER — Other Ambulatory Visit: Payer: Self-pay | Admitting: *Deleted

## 2018-10-01 ENCOUNTER — Other Ambulatory Visit: Payer: Self-pay | Admitting: Family

## 2018-10-01 ENCOUNTER — Ambulatory Visit (HOSPITAL_BASED_OUTPATIENT_CLINIC_OR_DEPARTMENT_OTHER)
Admission: RE | Admit: 2018-10-01 | Discharge: 2018-10-01 | Disposition: A | Discharge status: Home / Self Care | Payer: Medicare Other | Source: Ambulatory Visit | Attending: Family | Admitting: Family

## 2018-10-01 ENCOUNTER — Other Ambulatory Visit: Payer: Self-pay

## 2018-10-01 DIAGNOSIS — N9489 Other specified conditions associated with female genital organs and menstrual cycle: Secondary | ICD-10-CM | POA: Diagnosis not present

## 2018-10-02 ENCOUNTER — Encounter: Payer: Self-pay | Admitting: Family

## 2018-10-20 ENCOUNTER — Other Ambulatory Visit: Payer: Self-pay | Admitting: Family

## 2018-10-28 ENCOUNTER — Other Ambulatory Visit: Payer: Self-pay | Admitting: Family

## 2018-10-29 ENCOUNTER — Encounter: Payer: Self-pay | Admitting: Family

## 2018-11-05 ENCOUNTER — Other Ambulatory Visit: Payer: Self-pay

## 2018-11-05 ENCOUNTER — Ambulatory Visit (INDEPENDENT_AMBULATORY_CARE_PROVIDER_SITE_OTHER): Payer: Medicare Other | Admitting: Family

## 2018-11-05 DIAGNOSIS — Z23 Encounter for immunization: Secondary | ICD-10-CM | POA: Diagnosis not present

## 2018-11-05 DIAGNOSIS — N950 Postmenopausal bleeding: Secondary | ICD-10-CM | POA: Diagnosis not present

## 2018-11-05 DIAGNOSIS — R6889 Other general symptoms and signs: Secondary | ICD-10-CM

## 2018-11-05 DIAGNOSIS — E119 Type 2 diabetes mellitus without complications: Secondary | ICD-10-CM | POA: Diagnosis not present

## 2018-11-05 NOTE — Patient Instructions (Signed)
Please complete lab work prior to leaving. You should be contacted about your referral to GYN for further evaluation of your bleeding.

## 2018-11-05 NOTE — Progress Notes (Signed)
Subjective:    Patient ID: Cassidy Harrell, female    DOB: October 21, 1950, 68 y.o.   MRN: [id number]  HPI  Patient is a 68 year old female who presents today with report of vaginal bleeding.  She first noticed this bleeding about 3 weeks ago.  She notes scant blood on the tissue most of the time after she wipes.  She denies any obvious rectal bleeding.  She has not needed to use a panty liner or pad. Reports that she stills sees about once a day.  Reports that  She has been sweating more. Worse after she showers.  Reports that she entered menopause just before age 44.       Review of Systems    see HPI  Past Medical History:  Diagnosis Date  . Arthritis   . Depression   . Gum disease   . Heart murmur   . History of syphilis   . Hypertension   . Irregular heart beat   . Personal history of colonic polyps   . Phlebitis   . Pre-diabetes      Social History   Socioeconomic History  . Marital status: Divorced    Spouse name: Not on file  . Number of children: 1  . Years of education: Not on file  . Highest education level: Not on file  Occupational History  . Not on file  Social Needs  . Financial resource strain: Not on file  . Food insecurity    Worry: Not on file    Inability: Not on file  . Transportation needs    Medical: Not on file    Non-medical: Not on file  Tobacco Use  . Smoking status: Former Smoker    Types: Cigarettes  . Smokeless tobacco: Never Used  Substance and Sexual Activity  . Alcohol use: No  . Drug use: No  . Sexual activity: Not on file  Lifestyle  . Physical activity    Days per week: Not on file    Minutes per session: Not on file  . Stress: Not on file  Relationships  . Social Herbalist on phone: Not on file    Gets together: Not on file    Attends religious service: Not on file    Active member of club or organization: Not on file    Attends meetings of clubs or organizations: Not on file    Relationship status:  Not on file  . Intimate partner violence    Fear of current or ex partner: Not on file    Emotionally abused: Not on file    Physically abused: Not on file    Forced sexual activity: Not on file  Other Topics Concern  . 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 SHOULDER ARTHROSCOPY Right 12/2015  . SPINE SURGERY  08/20/2015   bulging / herniated disc in C-spine  . THERAPEUTIC ABORTION  1970. 1974, 1978    Family History  Problem Relation Age of  Onset  . Diabetes Mother   . Cancer Father        lung cancer  . Kidney disease Father   . Seizures Father   . Cancer Brother        colon  . Heart attack Brother   . Stroke Brother   . Colon cancer Neg Hx   . Stomach cancer Neg Hx     Allergies  Allergen Reactions  . Aldactone [Spironolactone] Other (See Comments)    itching    Current Outpatient Medications on File Prior to Visit  Medication Sig Dispense Refill  . amLODipine (NORVASC) 5 MG tablet Take 1 tablet by mouth once daily 90 tablet 0  . aspirin 81 MG chewable tablet Chew 81 mg by mouth daily.      . Calcium Carbonate-Vitamin D (CALTRATE 600+D) 600-400 MG-UNIT per tablet Take 1 tablet by mouth 2 (two) times daily.    . Ferrous Gluconate (IRON) 240 (27 FE) MG TABS Take 1 tablet by mouth daily.      Marland Kitchen lisinopril (ZESTRIL) 40 MG tablet Take 1 tablet (40 mg total) by mouth daily. 90 tablet 1  . meloxicam (MOBIC) 7.5 MG tablet Take 1 tablet (7.5 mg total) by mouth daily. 30 tablet 2  . metoprolol succinate (TOPROL-XL) 25 MG 24 hr tablet Take 1 tablet by mouth once daily 90  tablet 0   No current facility-administered medications on file prior to visit.     BP (!) 121/51 (BP Location: Left Arm, Cuff Size: Large)   Pulse 90   Temp (!) 96.8 F (36 C) (Temporal)   Resp 12   Ht _0  (1.6 m)   Wt 186 lb 6.4 oz (84.6 kg)   SpO2 100%   BMI 33.02 kg/m    Objective:   Physical Exam Constitutional:      Appearance: She is well-developed.  Cardiovascular:     Rate and Rhythm: Normal rate and regular rhythm.     Heart sounds: Normal heart sounds. No murmur.  Pulmonary:     Effort: Pulmonary effort is normal. No respiratory distress.     Breath sounds: Normal breath sounds. No wheezing.  Genitourinary:    Pubic Area: No rash.      Labia:        Right: No rash.        Left: No rash.      Urethra: No urethral swelling or urethral lesion.     Comments: Normal external perineal area.  Small external hemorrhoid is noted.  Introitus is noted to be dry.  There is a scant amount of brown blood near the cervical os, cervix is otherwise Psychiatric:        Behavior: Behavior normal.        Thought Content: Thought content normal.        Judgment: Judgment normal.           Assessment & Plan:  Diabetes type 2-she is past due for A1c.  (this is kit sent from insurance) Will obtain A1c today in the office instead.  This is diet-controlled.  Postmenopausal bleeding- will refer to gynecology for further evaluation  Heat intolerance-will obtain TSH to rule out hyperthyroidism.

## 2018-11-06 ENCOUNTER — Encounter: Payer: Self-pay | Admitting: Family

## 2018-11-06 LAB — BASIC METABOLIC PANEL
BUN: 19 mg/dL (ref 6–23)
CO2: 28 mEq/L (ref 19–32)
Calcium: 9.4 mg/dL (ref 8.4–10.5)
Chloride: 106 mEq/L (ref 96–112)
Creatinine, Ser: 0.75 mg/dL (ref 0.40–1.20)
GFR: 93.07 mL/min (ref >60.00)
Glucose, Bld: 77 mg/dL (ref 70–99)
Potassium: 4.4 mEq/L (ref 3.5–5.1)
Sodium: 142 mEq/L (ref 135–145)

## 2018-11-06 LAB — HEMOGLOBIN A1C: Hgb A1c MFr Bld: 7.1 % — ABNORMAL HIGH (ref 4.6–6.5)

## 2018-11-06 LAB — TSH: TSH: 3.74 u[IU]/mL (ref 0.35–4.50)

## 2018-12-04 ENCOUNTER — Encounter: Payer: Self-pay | Admitting: Obstetrics & Gynecology

## 2018-12-04 ENCOUNTER — Other Ambulatory Visit (HOSPITAL_COMMUNITY)
Admission: RE | Admit: 2018-12-04 | Discharge: 2018-12-04 | Disposition: A | Discharge status: Home / Self Care | Payer: Medicare Other | Source: Ambulatory Visit | Attending: Obstetrics & Gynecology | Admitting: Obstetrics & Gynecology

## 2018-12-04 ENCOUNTER — Other Ambulatory Visit: Payer: Self-pay

## 2018-12-04 ENCOUNTER — Ambulatory Visit (INDEPENDENT_AMBULATORY_CARE_PROVIDER_SITE_OTHER): Payer: Medicare Other | Admitting: Obstetrics & Gynecology

## 2018-12-04 ENCOUNTER — Encounter: Payer: Medicare Other | Admitting: Obstetrics & Gynecology

## 2018-12-04 VITALS — BP 135/61 | HR 87 | Ht 63.0 in | Wt 185.0 lb

## 2018-12-04 DIAGNOSIS — Z1151 Encounter for screening for human papillomavirus (HPV): Secondary | ICD-10-CM | POA: Diagnosis not present

## 2018-12-04 DIAGNOSIS — N83201 Unspecified ovarian cyst, right side: Secondary | ICD-10-CM

## 2018-12-04 DIAGNOSIS — Z124 Encounter for screening for malignant neoplasm of cervix: Secondary | ICD-10-CM | POA: Insufficient documentation

## 2018-12-04 DIAGNOSIS — N950 Postmenopausal bleeding: Secondary | ICD-10-CM

## 2018-12-04 DIAGNOSIS — N841 Polyp of cervix uteri: Secondary | ICD-10-CM

## 2018-12-04 DIAGNOSIS — N83202 Unspecified ovarian cyst, left side: Secondary | ICD-10-CM

## 2018-12-04 NOTE — Progress Notes (Signed)
Pt states that she started spotting the second week of August x 2 weeks.

## 2018-12-04 NOTE — Progress Notes (Signed)
Subjective:     Cassidy Harrell is a 68 y.o. female here for a routine exam.  Current complaints: bleedgin noted in Aug 2020.   Pt reports that it was spotting that occurred over a couple of days. It was not painful and was not assoc with pain. LMP was >10 years prev. Pt reports that she still has hot flushes.  She did note that she had a steroid injection of her spine prior to noting the bleeding. She is not sexually active and has not been since 2006.    Gynecologic History No LMP recorded. Patient is postmenopausal. Contraception: post menopausal status Last Pap: 04/01/2013. Results were: normal Last mammogram: 3.04/2018. Results were: normal  Obstetric History OB History  Gravida Para Term Preterm AB Living  2 1 1   1 1   SAB TAB Ectopic Multiple Live Births    1     1    # Outcome Date GA Lbr Len/2nd Weight Sex Delivery Anes PTL Lv  2 Term 23 [redacted]w[redacted]d   M Vag-Spont  N LIV  1 TAB 1970             The following portions of the patient's history were reviewed and updated as appropriate: allergies, current medications, past family history, past medical history, past social history, past surgical history and problem list.  Review of Systems Pertinent items are noted in HPI.    Objective:  BP 135/61   Pulse 87   Ht 5\' 3"  (1.6 m)   Wt 185 lb (83.9 kg)   BMI 32.77 kg/m  The indications for endometrial biopsy were reviewed.   Risks of the biopsy including cramping, bleeding, infection, uterine perforation, inadequate specimen and need for additional procedures  were discussed. The patient states she understands and agrees to undergo procedure today. Consent was signed. Time out was performed. Urine HCG was negative. A sterile speculum was placed in the patient's vagina and the cervix was prepped with Betadine. A single-toothed tenaculum was placed on the anterior lip of the cervix to stabilize it. The 3 mm pipelle was introduced into the endometrial cavity without difficulty to a depth of  6cm, and a minimal amount of tissue was obtained and sent to pathology. The instruments were removed from the patient's vagina. Minimal bleeding from the cervix was noted. The patient tolerated the procedure well.   CLINICAL DATA:  Follow-up MRI, question cyst on left ovary  EXAM: ULTRASOUND PELVIS TRANSVAGINAL  TECHNIQUE: Transvaginal ultrasou/nd examination of the pelvis was performed including evaluation of the uterus, ovaries, adnexal regions, and pelvic cul-de-sac.  COMPARISON:  Lumbar MRI 09/29/2018  FINDINGS: Uterus  Measurements: 7.2 x 4.2 x 4.4 cm = volume: 70 mL. 1.7 cm right posterior intramural fibroid  Endometrium  Thickness: 3 mm in thickness.  No focal abnormality visualized.  Right ovary  Measurements: 4.0 x 3.1 x 3.4 cm = volume: 22 mL. Benign-appearing cyst in the right ovary measures 3.5 x 2.8 x 2.4 cm. Thin internal septation. No internal blood flow or nodularity.  Left ovary  Measurements: 4.1 x 1.7 x 3.6 cm = volume: 13 ML. 3.5 x 2.2 x 2.4 cm simple appearing cyst.  Other findings:  No abnormal free fluid  IMPRESSION: Bilateral ovarian cysts, measuring up to 3.5 cm. These appear benign. These could be followed with repeat ultrasound in 6 months to ensure stability.  Assessment:   PMPB. Pt with thin endometrial stripe however, will perform endo bx today  Ovarian cyst- favor benign. Rec repeat  US in 3-6 months     Plan:   Routine post-procedure instructions were given to the patient. The patient will follow up to review the results and for further management.    F/u US in Jan 2021 F/u PAP with hrHPV F/u surg path in 2 weeks or sooner prn  Neysa Arts L. Harraway-Smith, M.D., Cherlynn June

## 2018-12-04 NOTE — Patient Instructions (Signed)
Endometrial Biopsy, Care After This sheet gives you information about how to care for yourself after your procedure. Your health care provider may also give you more specific instructions. If you have problems or questions, contact your health care provider. What can I expect after the procedure? After the procedure, it is common to have:  Mild cramping.  A small amount of vaginal bleeding for a few days. This is normal. Follow these instructions at home:   Take over-the-counter and prescription medicines only as told by your health care provider.  Do not douche, use tampons, or have sexual intercourse until your health care provider approves.  Return to your normal activities as told by your health care provider. Ask your health care provider what activities are safe for you.  Follow instructions from your health care provider about any activity restrictions, such as restrictions on strenuous exercise or heavy lifting. Contact a health care provider if:  You have heavy bleeding, or bleed for longer than 2 days after the procedure.  You have bad smelling discharge from your vagina.  You have a fever or chills.  You have a burning sensation when urinating or you have difficulty urinating.  You have severe pain in your lower abdomen. Get help right away if:  You have severe cramps in your stomach or back.  You pass large blood clots.  Your bleeding increases.  You become weak or light-headed, or you pass out. Summary  After the procedure, it is common to have mild cramping and a small amount of vaginal bleeding for a few days.  Do not douche, use tampons, or have sexual intercourse until your health care provider approves.  Return to your normal activities as told by your health care provider. Ask your health care provider what activities are safe for you. This information is not intended to replace advice given to you by your health care provider. Make sure you discuss any  questions you have with your health care provider. Document Released: 12/04/2012 Document Revised: 01/26/2017 Document Reviewed: 03/01/2016 Elsevier Patient Education  2020 [employer]. Endometrial Biopsy  Endometrial biopsy is a procedure in which a tissue sample is taken from inside the uterus. The sample is taken from the endometrium, which is the lining of the uterus. The tissue sample is then checked under a microscope to see if the tissue is normal or abnormal. This procedure helps to determine where you are in your menstrual cycle and how hormone levels are affecting the lining of the uterus. This procedure may also be used to evaluate uterine bleeding or to diagnose endometrial cancer, endometrial tuberculosis, polyps, or other inflammatory conditions. Tell a health care provider about:  Any allergies you have.  All medicines you are taking, including vitamins, herbs, eye drops, creams, and over-the-counter medicines.  Any problems you or family members have had with anesthetic medicines.  Any blood disorders you have.  Any surgeries you have had.  Any medical conditions you have.  Whether you are pregnant or may be pregnant. What are the risks? Generally, this is a safe procedure. However, problems may occur, including:  Bleeding.  Pelvic infection.  Puncture of the wall of the uterus with the biopsy device (rare). What happens before the procedure?  Keep a record of your menstrual cycles as told by your health care provider. You may need to schedule your procedure for a specific time in your cycle.  You may want to bring a sanitary pad to wear after the procedure.  Ask  your health care provider about: ? Changing or stopping your regular medicines. This is especially important if you are taking diabetes medicines or blood thinners. ? Taking medicines such as aspirin and ibuprofen. These medicines can thin your blood. Do not take these medicines before your procedure if  your health care provider instructs you not to.  Plan to have someone take you home from the hospital or clinic. What happens during the procedure?  To lower your risk of infection: ? Your health care team will wash or sanitize their hands.  You will lie on an exam table with your feet and legs supported as in a pelvic exam.  Your health care provider will insert an instrument (speculum) into your vagina to see your cervix.  Your cervix will be cleansed with an antiseptic solution.  A medicine (local anesthetic) will be used to numb the cervix.  A forceps instrument (tenaculum) will be used to hold your cervix steady for the biopsy.  A thin, rod-like instrument (uterine sound) will be inserted through your cervix to determine the length of your uterus and the location where the biopsy sample will be removed.  A thin, flexible tube (catheter) will be inserted through your cervix and into the uterus. The catheter will be used to collect the biopsy sample from your endometrial tissue.  The catheter and speculum will then be removed, and the tissue sample will be sent to a lab for examination. What happens after the procedure?  You will rest in a recovery area until you are ready to go home.  You may have mild cramping and a small amount of vaginal bleeding. This is normal.  It is up to you to get the results of your procedure. Ask your health care provider, or the department that is doing the procedure, when your results will be ready. Summary  Endometrial biopsy is a procedure in which a tissue sample is taken from the endometrium, which is the lining of the uterus.  This procedure may help to diagnose menstrual cycle problems, abnormal bleeding, or other conditions affecting the endometrium.  Before the procedure, keep a record of your menstrual cycles as told by your health care provider.  The tissue sample that is removed will be checked under a microscope to see if it is  normal or abnormal. This information is not intended to replace advice given to you by your health care provider. Make sure you discuss any questions you have with your health care provider. Document Released: 06/16/2004 Document Revised: 01/26/2017 Document Reviewed: 03/01/2016 Elsevier Patient Education  2020 [employer].

## 2018-12-09 ENCOUNTER — Other Ambulatory Visit: Payer: Self-pay

## 2018-12-10 LAB — SURGICAL PATHOLOGY

## 2018-12-11 ENCOUNTER — Telehealth: Payer: Self-pay

## 2018-12-11 NOTE — Telephone Encounter (Signed)
Called pt regarding results. Pt made aware that her endo bx was negative and there is no evidence of cancer. Pt advised to f/u if bleeding persists. Understanding was voiced.  Cassidy Harrell, CMA

## 2018-12-11 NOTE — Telephone Encounter (Signed)
-----   Message from Lavonia Drafts, MD sent at 12/10/2018  4:24 PM EDT ----- Please call pt. Her endo bx was negative. There was no evidence of cancer. Please have her f/u if her bleeding persists.   Thx,  [facility]-S

## 2018-12-16 LAB — CYTOLOGY - PAP
Comment: NEGATIVE
Diagnosis: NEGATIVE
High risk HPV: NEGATIVE

## 2018-12-24 ENCOUNTER — Other Ambulatory Visit: Payer: Self-pay | Admitting: *Deleted

## 2018-12-24 ENCOUNTER — Other Ambulatory Visit: Payer: Self-pay | Admitting: Family

## 2018-12-24 MED ORDER — METOPROLOL SUCCINATE ER 25 MG PO TB24: 25.0000 mg | ORAL_TABLET | Freq: Every day | ORAL | 1 refills | Status: DC

## 2018-12-24 MED ORDER — MELOXICAM 7.5 MG PO TABS: 7.5000 mg | ORAL_TABLET | Freq: Every day | ORAL | 1 refills | Status: DC

## 2018-12-24 MED ORDER — LISINOPRIL 40 MG PO TABS: 40.0000 mg | ORAL_TABLET | Freq: Every day | ORAL | 0 refills | Status: DC

## 2018-12-24 MED ORDER — LISINOPRIL 40 MG PO TABS: 40.0000 mg | ORAL_TABLET | Freq: Every day | ORAL | 1 refills | Status: DC

## 2018-12-24 NOTE — Addendum Note (Signed)
Addended by: Kem Boroughs D on: 12/24/2018 11:00 AM   Modules accepted: Orders

## 2018-12-24 NOTE — Telephone Encounter (Signed)
Talked to patient both rx sent to [facility] for a 30 days supply

## 2018-12-24 NOTE — Telephone Encounter (Signed)
Patient requesting call back from [place] regarding this medication. She states [facility] is taking too long to deliver medication.

## 2018-12-25 ENCOUNTER — Ambulatory Visit: Payer: Medicare Other | Admitting: Obstetrics & Gynecology

## 2018-12-26 ENCOUNTER — Other Ambulatory Visit: Payer: Self-pay | Admitting: Family

## 2018-12-26 NOTE — Telephone Encounter (Signed)
Medication Refill - Medication: lisinopril (ZESTRIL) 40 MG tablet [id number] meloxicam (MOBIC) 7.5 MG tablet [id number]      Preferred Pharmacy (with phone number or street name):  [facility] ([address]), [place] - [address]  [id number] [place] ([place]) [place] [address]  Phone: [phone] Fax: [phone]     Agent: Please be advised that RX refills may take up to 3 business days. We ask that you follow-up with your pharmacy.

## 2019-01-20 ENCOUNTER — Other Ambulatory Visit: Payer: Self-pay

## 2019-01-20 MED ORDER — AMLODIPINE BESYLATE 5 MG PO TABS: 5.0000 mg | ORAL_TABLET | Freq: Every day | ORAL | 0 refills | Status: DC

## 2019-02-04 ENCOUNTER — Other Ambulatory Visit: Payer: Self-pay

## 2019-02-04 ENCOUNTER — Telehealth: Payer: Medicare Other | Admitting: Family

## 2019-02-04 ENCOUNTER — Encounter: Payer: Self-pay | Admitting: Medical

## 2019-02-04 ENCOUNTER — Ambulatory Visit (INDEPENDENT_AMBULATORY_CARE_PROVIDER_SITE_OTHER): Payer: Medicare Other | Admitting: Medical

## 2019-02-04 VITALS — BP 149/71

## 2019-02-04 DIAGNOSIS — Z20828 Contact with and (suspected) exposure to other viral communicable diseases: Secondary | ICD-10-CM | POA: Diagnosis not present

## 2019-02-04 DIAGNOSIS — Z20822 Contact with and (suspected) exposure to covid-19: Secondary | ICD-10-CM

## 2019-02-04 NOTE — Patient Instructions (Signed)
For covid exposure but no signs or symptoms, I put in order to [facility] on [address]. Hopefully turn around time for results 2-3 days. Advise quarantine until we get covid test results.  If you get any signs or symptoms let us know.  Follow up Monday as regular scheduled with pcp or as needed

## 2019-02-04 NOTE — Progress Notes (Signed)
   Subjective:    Patient ID: Cassidy Harrell, female    DOB: Aug 24, 1950, 68 y.o.   MRN: [id number]  HPI   Virtual Visit via Video Note  I connected with [name] on 02/04/19 at  8:00 AM EST by a video enabled telemedicine application and verified that I am speaking with the correct person using two identifiers.  Location: Patient: home Provider: office  Pt did not check bp or pulse.   I discussed the limitations of evaluation and management by telemedicine and the availability of in person appointments. The patient expressed understanding and agreed to proceed.  History of Present Illness:   Pt had some exposure to covid through nurse aid of her mother(nurse aid sister has covid so pt and mom may have been exposed to covid). This nurse aid come in every day for 2-3 hours. The aid was wearing a mask and family members had mask on as well.   Observations/Objective: General-no acute distress, pleasant, oriented.normal speech. Lungs- on inspection lungs appear unlabored. Neck- no tracheal deviation or jvd on inspection. Neuro- gross motor function appears intact.  Assessment and Plan: For covid exposure but no signs or symptoms, I put in order to get [facility] at [facility] on [address]. Hopefully turn around time for results 2-3 days. Advise quarantine until we get covid test results.  If you get any signs or symptoms let us know.  Follow up Monday as regular scheduled with pcp or as needed  Follow Up Instructions:    I discussed the assessment and treatment plan with the patient. The patient was provided an opportunity to ask questions and all were answered. The patient agreed with the plan and demonstrated an understanding of the instructions.   The patient was advised to call back or seek an in-person evaluation if the symptoms worsen or if the condition fails to improve as anticipated.  I provided 15  minutes of non-face-to-face time during  this encounter.   Mackie Pai, PA-C    Review of Systems  Constitutional: Negative for chills, fatigue and fever.  HENT: Negative for congestion and ear pain.   Respiratory: Negative for cough, chest tightness, shortness of breath and wheezing.   Cardiovascular: Negative for chest pain and palpitations.  Gastrointestinal: Negative for abdominal pain.  Genitourinary: Negative for difficulty urinating and dysuria.  Musculoskeletal: Negative for back pain, gait problem, joint swelling and myalgias.  Skin: Negative for rash.  Neurological: Negative for dizziness, light-headedness and headaches.  Hematological: Negative for adenopathy. Does not bruise/bleed easily.  Psychiatric/Behavioral: Negative for behavioral problems and dysphoric mood. The patient is not nervous/anxious.          Objective:   Physical Exam        Assessment & Plan:

## 2019-02-05 ENCOUNTER — Encounter: Payer: Self-pay | Admitting: Medical

## 2019-02-06 LAB — NOVEL CORONAVIRUS, NAA: SARS-CoV-2, NAA: NOT DETECTED

## 2019-02-10 ENCOUNTER — Encounter: Payer: Self-pay | Admitting: Family

## 2019-02-10 ENCOUNTER — Other Ambulatory Visit: Payer: Self-pay

## 2019-02-10 ENCOUNTER — Telehealth (INDEPENDENT_AMBULATORY_CARE_PROVIDER_SITE_OTHER): Payer: Medicare Other | Admitting: Family

## 2019-02-10 DIAGNOSIS — E119 Type 2 diabetes mellitus without complications: Secondary | ICD-10-CM | POA: Diagnosis not present

## 2019-02-10 DIAGNOSIS — I10 Essential (primary) hypertension: Secondary | ICD-10-CM | POA: Diagnosis not present

## 2019-02-10 DIAGNOSIS — M549 Dorsalgia, unspecified: Secondary | ICD-10-CM

## 2019-02-10 DIAGNOSIS — G8929 Other chronic pain: Secondary | ICD-10-CM | POA: Diagnosis not present

## 2019-02-10 MED ORDER — BLOOD GLUCOSE MONITOR KIT: PACK | 0 refills | Status: AC

## 2019-02-10 NOTE — Progress Notes (Signed)
Virtual Visit via Video Note  I connected with Cassidy Harrell on 02/10/19 at 12:40 PM EST by a video enabled telemedicine application and verified that I am speaking with the correct person using two identifiers.  Location: Patient: home Provider: home   I discussed the limitations of evaluation and management by telemedicine and the availability of in person appointments. The patient expressed understanding and agreed to proceed.  History of Present Illness:   HTN- on amlodipine 5mg  and metoprolol 25mg , and lisinopril 40mg .  Chronic LE swelling- not as backas it was last year.  BP Readings from Last 3 Encounters:  02/05/19 (!) 149/71  12/04/18 135/61  11/05/18 (!) 121/51   DM2-  Lab Results  Component Value Date   HGBA1C 7.1 (H) 11/05/2018   HGBA1C 6.3 (H) 02/08/2018   HGBA1C 6.7 (H) 09/13/2017   Lab Results  Component Value Date   MICROALBUR 0.7 09/16/2014   LDLCALC 13 10/03/2016   CREATININE 0.75 11/05/2018   Arthritis pain and back pain- reports that she takes meloxicam daily which helps her pain. See Dr Ellene Route who helps her with her back pain.   Reports that she has had a rash "down by my wrist."  First noticed it on 12/11.  Reports that she used a chlorox wipe to wipe her hand.    Lab Results  Component Value Date   WBC 4.2 04/04/2017   HGB 12.9 04/04/2017   HCT 39.2 04/04/2017   MCV 83.7 04/04/2017   PLT 183.0 04/04/2017      Observations/Objective:   Gen: Awake, alert, no acute distress Resp: Breathing is even and non-labored Psych: calm/pleasant demeanor Neuro: Alert and Oriented x 3, + facial symmetry, speech is clear.   Assessment and Plan:  HTN- fair BP, continue current meds.    DM2- not checking sugars at home. Will send rx to her pharmacy for a glucometer and supplies. She is advised on home glycemic goals.  Chronic back pain- has been taking ibuprofen and meloxicam.  I advised her to d/c ibuprofen and continue meloxicam. Add tylenol prn.  She continues to follow with neurosurgery.     Follow Up Instructions:    I discussed the assessment and treatment plan with the patient. The patient was provided an opportunity to ask questions and all were answered. The patient agreed with the plan and demonstrated an understanding of the instructions.   The patient was advised to call back or seek an in-person evaluation if the symptoms worsen or if the condition fails to improve as anticipated.  [name], NP

## 2019-02-22 ENCOUNTER — Other Ambulatory Visit: Payer: Self-pay | Admitting: Family

## 2019-03-04 ENCOUNTER — Other Ambulatory Visit: Payer: Self-pay

## 2019-03-04 ENCOUNTER — Ambulatory Visit (HOSPITAL_BASED_OUTPATIENT_CLINIC_OR_DEPARTMENT_OTHER)
Admission: RE | Admit: 2019-03-04 | Discharge: 2019-03-04 | Disposition: A | Discharge status: Home / Self Care | Payer: Medicare Other | Source: Ambulatory Visit | Attending: Obstetrics & Gynecology | Admitting: Obstetrics & Gynecology

## 2019-03-04 DIAGNOSIS — N83201 Unspecified ovarian cyst, right side: Secondary | ICD-10-CM | POA: Insufficient documentation

## 2019-03-04 DIAGNOSIS — N83202 Unspecified ovarian cyst, left side: Secondary | ICD-10-CM | POA: Diagnosis present

## 2019-03-04 DIAGNOSIS — N950 Postmenopausal bleeding: Secondary | ICD-10-CM | POA: Diagnosis present

## 2019-03-30 ENCOUNTER — Encounter: Payer: Self-pay | Admitting: Family

## 2019-03-31 NOTE — Telephone Encounter (Signed)
Patient scheduled for virtual visit tomorrow afternoon

## 2019-04-01 ENCOUNTER — Other Ambulatory Visit: Payer: Self-pay

## 2019-04-01 ENCOUNTER — Emergency Department (HOSPITAL_BASED_OUTPATIENT_CLINIC_OR_DEPARTMENT_OTHER): Payer: Medicare Other

## 2019-04-01 ENCOUNTER — Encounter (HOSPITAL_BASED_OUTPATIENT_CLINIC_OR_DEPARTMENT_OTHER): Payer: Self-pay

## 2019-04-01 ENCOUNTER — Encounter: Payer: Self-pay | Admitting: Family

## 2019-04-01 ENCOUNTER — Inpatient Hospital Stay (HOSPITAL_BASED_OUTPATIENT_CLINIC_OR_DEPARTMENT_OTHER)
Admission: EM | Admit: 2019-04-01 | Discharge: 2019-04-06 | DRG: 373 | Disposition: A | Discharge status: Home / Self Care | Payer: Medicare Other | Attending: General Surgery | Admitting: General Surgery

## 2019-04-01 ENCOUNTER — Ambulatory Visit (INDEPENDENT_AMBULATORY_CARE_PROVIDER_SITE_OTHER): Payer: Medicare Other | Admitting: Family

## 2019-04-01 VITALS — BP 121/75

## 2019-04-01 DIAGNOSIS — Z8249 Family history of ischemic heart disease and other diseases of the circulatory system: Secondary | ICD-10-CM

## 2019-04-01 DIAGNOSIS — B964 Proteus (mirabilis) (morganii) as the cause of diseases classified elsewhere: Secondary | ICD-10-CM | POA: Diagnosis not present

## 2019-04-01 DIAGNOSIS — K3533 Acute appendicitis with perforation and localized peritonitis, with abscess: Secondary | ICD-10-CM | POA: Diagnosis not present

## 2019-04-01 DIAGNOSIS — Z823 Family history of stroke: Secondary | ICD-10-CM | POA: Diagnosis not present

## 2019-04-01 DIAGNOSIS — R140 Abdominal distension (gaseous): Secondary | ICD-10-CM

## 2019-04-01 DIAGNOSIS — Z841 Family history of disorders of kidney and ureter: Secondary | ICD-10-CM | POA: Diagnosis not present

## 2019-04-01 DIAGNOSIS — R7303 Prediabetes: Secondary | ICD-10-CM | POA: Diagnosis present

## 2019-04-01 DIAGNOSIS — Z833 Family history of diabetes mellitus: Secondary | ICD-10-CM | POA: Diagnosis not present

## 2019-04-01 DIAGNOSIS — K651 Peritoneal abscess: Secondary | ICD-10-CM | POA: Diagnosis not present

## 2019-04-01 DIAGNOSIS — Z03818 Encounter for observation for suspected exposure to other biological agents ruled out: Secondary | ICD-10-CM | POA: Diagnosis not present

## 2019-04-01 DIAGNOSIS — E039 Hypothyroidism, unspecified: Secondary | ICD-10-CM | POA: Diagnosis present

## 2019-04-01 DIAGNOSIS — R197 Diarrhea, unspecified: Secondary | ICD-10-CM | POA: Diagnosis not present

## 2019-04-01 DIAGNOSIS — Z888 Allergy status to other drugs, medicaments and biological substances status: Secondary | ICD-10-CM | POA: Diagnosis not present

## 2019-04-01 DIAGNOSIS — Z791 Long term (current) use of non-steroidal anti-inflammatories (NSAID): Secondary | ICD-10-CM

## 2019-04-01 DIAGNOSIS — Z87891 Personal history of nicotine dependence: Secondary | ICD-10-CM

## 2019-04-01 DIAGNOSIS — F329 Major depressive disorder, single episode, unspecified: Secondary | ICD-10-CM | POA: Diagnosis present

## 2019-04-01 DIAGNOSIS — Z801 Family history of malignant neoplasm of trachea, bronchus and lung: Secondary | ICD-10-CM | POA: Diagnosis not present

## 2019-04-01 DIAGNOSIS — R1031 Right lower quadrant pain: Secondary | ICD-10-CM

## 2019-04-01 DIAGNOSIS — Z79899 Other long term (current) drug therapy: Secondary | ICD-10-CM

## 2019-04-01 DIAGNOSIS — K381 Appendicular concretions: Secondary | ICD-10-CM | POA: Diagnosis present

## 2019-04-01 DIAGNOSIS — Z7982 Long term (current) use of aspirin: Secondary | ICD-10-CM

## 2019-04-01 DIAGNOSIS — Z86010 Personal history of colonic polyps: Secondary | ICD-10-CM

## 2019-04-01 DIAGNOSIS — K3532 Acute appendicitis with perforation and localized peritonitis, without abscess: Secondary | ICD-10-CM | POA: Diagnosis present

## 2019-04-01 DIAGNOSIS — J9811 Atelectasis: Secondary | ICD-10-CM | POA: Diagnosis not present

## 2019-04-01 DIAGNOSIS — M1990 Unspecified osteoarthritis, unspecified site: Secondary | ICD-10-CM | POA: Diagnosis present

## 2019-04-01 DIAGNOSIS — Z8672 Personal history of thrombophlebitis: Secondary | ICD-10-CM

## 2019-04-01 DIAGNOSIS — I10 Essential (primary) hypertension: Secondary | ICD-10-CM | POA: Diagnosis present

## 2019-04-01 DIAGNOSIS — Z20822 Contact with and (suspected) exposure to covid-19: Secondary | ICD-10-CM | POA: Diagnosis present

## 2019-04-01 DIAGNOSIS — E876 Hypokalemia: Secondary | ICD-10-CM | POA: Diagnosis present

## 2019-04-01 DIAGNOSIS — R109 Unspecified abdominal pain: Secondary | ICD-10-CM | POA: Diagnosis not present

## 2019-04-01 DIAGNOSIS — K3521 Acute appendicitis with generalized peritonitis, with abscess: Secondary | ICD-10-CM | POA: Diagnosis not present

## 2019-04-01 LAB — CBC WITH DIFFERENTIAL/PLATELET
Abs Immature Granulocytes: 0.12 10*3/uL — ABNORMAL HIGH (ref 0.00–0.07)
Basophils Absolute: 0 10*3/uL (ref 0.0–0.1)
Basophils Relative: 0 %
Eosinophils Absolute: 0 10*3/uL (ref 0.0–0.5)
Eosinophils Relative: 0 %
HCT: 40.4 % (ref 36.0–46.0)
Hemoglobin: 12.8 g/dL (ref 12.0–15.0)
Immature Granulocytes: 1 %
Lymphocytes Relative: 13 %
Lymphs Abs: 2.3 10*3/uL (ref 0.7–4.0)
MCH: 27.3 pg (ref 26.0–34.0)
MCHC: 31.7 g/dL (ref 30.0–36.0)
MCV: 86.1 fL (ref 80.0–100.0)
Monocytes Absolute: 1.6 10*3/uL — ABNORMAL HIGH (ref 0.1–1.0)
Monocytes Relative: 9 %
Neutro Abs: 14.1 10*3/uL — ABNORMAL HIGH (ref 1.7–7.7)
Neutrophils Relative %: 77 %
Platelets: 236 10*3/uL (ref 150–400)
RBC: 4.69 MIL/uL (ref 3.87–5.11)
RDW: 13.6 % (ref 11.5–15.5)
WBC: 18.2 10*3/uL — ABNORMAL HIGH (ref 4.0–10.5)
nRBC: 0 % (ref 0.0–0.2)

## 2019-04-01 LAB — COMPREHENSIVE METABOLIC PANEL
ALT: 20 U/L (ref 0–44)
AST: 18 U/L (ref 15–41)
Albumin: 3.7 g/dL (ref 3.5–5.0)
Alkaline Phosphatase: 81 U/L (ref 38–126)
Anion gap: 11 (ref 5–15)
BUN: 11 mg/dL (ref 8–23)
CO2: 23 mmol/L (ref 22–32)
Calcium: 9 mg/dL (ref 8.9–10.3)
Chloride: 102 mmol/L (ref 98–111)
Creatinine, Ser: 0.97 mg/dL (ref 0.44–1.00)
GFR calc Af Amer: >60 mL/min (ref >60)
GFR calc non Af Amer: >60 mL/min (ref >60)
Glucose, Bld: 157 mg/dL — ABNORMAL HIGH (ref 70–99)
Potassium: 3.5 mmol/L (ref 3.5–5.1)
Sodium: 136 mmol/L (ref 135–145)
Total Bilirubin: 0.4 mg/dL (ref 0.3–1.2)
Total Protein: 8.3 g/dL — ABNORMAL HIGH (ref 6.5–8.1)

## 2019-04-01 LAB — SARS CORONAVIRUS 2 BY RT PCR (HOSPITAL ORDER, PERFORMED IN ~~LOC~~ HOSPITAL LAB): SARS Coronavirus 2: NEGATIVE

## 2019-04-01 LAB — SARS CORONAVIRUS 2 AG (30 MIN TAT): SARS Coronavirus 2 Ag: NEGATIVE

## 2019-04-01 LAB — LIPASE, BLOOD: Lipase: 22 U/L (ref 11–51)

## 2019-04-01 MED ORDER — ONDANSETRON HCL 4 MG/2ML IJ SOLN
4.0000 mg | Freq: Once | INTRAMUSCULAR | Status: AC
  Administered 2019-04-01: 21:00:00 4 mg via INTRAVENOUS
  Filled 2019-04-01: qty 2

## 2019-04-01 MED ORDER — SODIUM CHLORIDE 0.9 % IV SOLN: INTRAVENOUS | Status: DC

## 2019-04-01 MED ORDER — HYDROMORPHONE HCL 1 MG/ML IJ SOLN
0.5000 mg | Freq: Once | INTRAMUSCULAR | Status: AC
  Administered 2019-04-01: 21:00:00 0.5 mg via INTRAVENOUS
  Filled 2019-04-01: qty 1

## 2019-04-01 MED ORDER — PIPERACILLIN-TAZOBACTAM 3.375 G IVPB 30 MIN
3.3750 g | Freq: Once | INTRAVENOUS | Status: AC
  Administered 2019-04-01: 21:00:00 3.375 g via INTRAVENOUS
  Filled 2019-04-01 (×2): qty 50

## 2019-04-01 MED ORDER — IOHEXOL 300 MG/ML  SOLN
100.0000 mL | Freq: Once | INTRAMUSCULAR | Status: AC | PRN
  Administered 2019-04-01: 20:00:00 100 mL via INTRAVENOUS

## 2019-04-01 NOTE — ED Triage Notes (Signed)
Pt c/o right side abd pain, diarrhea x 1 week-vomited x 1 yesterday-NAD-steady gait

## 2019-04-01 NOTE — Progress Notes (Signed)
Virtual Visit via Video Note  I connected with Cassidy Harrell on 04/01/19 at  5:00 PM EST by a video enabled telemedicine application and verified that I am speaking with the correct person using two identifiers.  Location: Patient: home Provider: work   I discussed the limitations of evaluation and management by telemedicine and the availability of in person appointments. The patient expressed understanding and agreed to proceed.  History of Present Illness:  Patient is a 69 yr old female who presents today with chief complaint of bloating. She reports belching and passing gas. Symptoms began 7 days ago.    Reports that last Wednesday her stomach was swollen and "really tight."  Reports that she thought she was constipated. She drank some mag citrate.  Had a few small BM's then "it was like running water."  Reports frequent belching. Stomach felt very tight.  Reports "not half as tight as it was." Has mild tenderness in the upper abdomen and the lower abdomen.  Reports that she vomited x 1 on Monday.    Reports last BM was today.  Normal consistency.  (small BM). She has been drinking liquids today. Ate a 1/2 banana and some crackers.    Reports tenderness to palpation in the right lower abdomen and "in the middle."   Past Medical History:  Diagnosis Date  . Arthritis   . Depression   . Gum disease   . Heart murmur   . History of syphilis   . Hypertension   . Irregular heart beat   . Personal history of colonic polyps   . Phlebitis   . Pre-diabetes      Social History   Socioeconomic History  . Marital status: Divorced    Spouse name: Not on file  . Number of children: 1  . Years of education: Not on file  . Highest education level: Not on file  Occupational History  . Not on file  Tobacco Use  . Smoking status: Former Smoker    Types: Cigarettes  . Smokeless tobacco: Never Used  Substance and Sexual Activity  . Alcohol use: No  . Drug use: No  . Sexual activity:  Not on file  Other Topics Concern  . 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 Difficulty of Paying Living Expenses: Not on file  Food Insecurity:   . Worried About Running Out of Food in the Last Year: Not on file  . Ran Out of Food in the Last Year: Not on file  Transportation Needs:   . Lack of Transportation (Medical): Not on file  . Lack of Transportation (Non-Medical): Not on file  Physical Activity:   . Days of Exercise per Week: Not on file  . Minutes of Exercise per Session: Not on file  Stress:   . Feeling of Stress : Not on file  Social Connections:   . Frequency of Communication with Friends and Family: Not on file  . Frequency of Social Gatherings   with Friends and Family: Not on file  . Attends Religious Services: Not on file  . Active Member of Clubs or Organizations: Not on file  . Attends Archivist Meetings: Not on file  . Marital Status: Not on file  Intimate Partner Violence:   . Fear of Current or Ex-Partner: Not on file  . Emotionally Abused: Not on file  . Physically Abused: Not on file  . Sexually Abused: Not on file    Past Surgical History:  Procedure Laterality Date  . SHOULDER ARTHROSCOPY Right 12/2015  . SPINE SURGERY  08/20/2015   bulging / herniated disc in C-spine  . THERAPEUTIC ABORTION  1970. 1974, 1978    Family History  Problem Relation Age of Onset  . Diabetes Mother   . Cancer Father        lung cancer  . Kidney disease Father   . Seizures Father   . Cancer Brother        colon  . Heart attack  Brother   . Stroke Brother   . Colon cancer Neg Hx   . Stomach cancer Neg Hx     Allergies  Allergen Reactions  . Aldactone [Spironolactone] Other (See Comments)    itching    Current Outpatient Medications on File Prior to Visit  Medication Sig Dispense Refill  . acetaminophen (TYLENOL) 650 MG CR tablet Take 650 mg by mouth every 8 (eight) hours as needed for pain.    Marland Kitchen amLODipine (NORVASC) 5 MG tablet TAKE 1 TABLET BY MOUTH  DAILY 90 tablet 1  . aspirin 81 MG chewable tablet Chew 81 mg by mouth daily.      . blood glucose meter kit and supplies KIT Dispense based on patient and insurance preference. Use up to four times daily as directed. (FOR ICD-9 250.00, 250.01). 1 each 0  . Calcium Carbonate-Vitamin D (CALTRATE 600+D) 600-400 MG-UNIT per tablet Take 1 tablet by mouth 2 (two) times daily.    . Ferrous Gluconate (IRON) 240 (27 FE) MG TABS Take 1 tablet by mouth daily.      Marland Kitchen lisinopril (ZESTRIL) 40 MG tablet Take 1 tablet (40 mg total) by mouth daily. 30 tablet 0  . meloxicam (MOBIC) 7.5 MG tablet Take 1 tablet by mouth once daily 30 tablet 0  . metoprolol succinate (TOPROL-XL) 25 MG 24 hr tablet Take 1 tablet (25 mg total) by mouth daily. 90 tablet 1   No current facility-administered medications on file prior to visit.    BP 121/75    Observations/Objective:   Gen: Awake, alert, no acute distress- belching frequently Resp: Breathing is even and non-labored Psych: calm/pleasant demeanor Neuro: Alert and Oriented x 3, + facial symmetry, speech is clear.   Assessment and Plan:  Abdominal pain- Hx is concerning for acute appendicitis. I think she needs abdominal imaging which I cannot order as an outpatient at this hour. Recommended that she proceed to the ED for further evaluation. Pt is agreeable. Report given to Dr. Rogene Houston EDP at the [facility] ED.   30 minutes spent on today's call.   Follow Up Instructions:    I discussed the assessment and treatment  plan with the patient. The patient was provided an opportunity to ask questions and all were answered. The patient agreed with the plan and demonstrated an understanding of the instructions.   The patient was advised to call back or seek an in-person evaluation if the symptoms worsen or if the condition fails to  improve as anticipated.  [name], NP

## 2019-04-01 NOTE — ED Provider Notes (Addendum)
[facility] Provider Note   CSN: [id number] Arrival date & time: 04/01/19  1842     History Chief Complaint  Patient presents with  . Abdominal Pain    Cassidy Harrell is a 69 y.o. female.  Patient sent in by primary care provider with concerns for right lower quadrant abdominal pain.  Patient states that discomfort started about a week ago.  Has had some increased right-sided abdominal pain no significant diarrhea occasionally a little bit of loose bowel movement and vomited once yesterday.  No vomiting today.  No fevers no shortness of breath no upper respiratory symptoms.  Past medical history significant for hypertension no past intra-abdominal surgery.        Past Medical History:  Diagnosis Date  . Arthritis   . Depression   . Gum disease   . Heart murmur   . History of syphilis   . Hypertension   . Irregular heart beat   . Personal history of colonic polyps   . Phlebitis   . Pre-diabetes     Patient Active Problem List   Diagnosis Date Noted  . Chest pain 06/30/2015  . Heart palpitations 06/30/2015  . Hypokalemia 06/07/2015  . Cellulitis and abscess of trunk 11/05/2014  . Myalgia and myositis 05/28/2014  . Allergic rhinitis 05/28/2014  . Thyromegaly 11/24/2013  . Foot pain 11/24/2013  . Anemia, iron deficiency 08/25/2013  . Diabetes type 2, controlled ([place]) 08/25/2013  . Other malaise and fatigue 10/14/2012  . Cervical disc disease 06/06/2012  . Routine general medical examination at a health care facility 11/13/2011  . Hypothyroid 10/24/2011  . Arthritis 09/25/2011  . Depression 09/25/2011  . History of syphilis 09/25/2011  . HTN (hypertension) 09/25/2011    Past Surgical History:  Procedure Laterality Date  . SHOULDER ARTHROSCOPY Right 12/2015  . SPINE SURGERY  08/20/2015   bulging / herniated disc in C-spine  . THERAPEUTIC ABORTION  1970. 1974, 1978     OB History    Gravida  2   Para  1   Term  1   Preterm      AB  1   Living  1     SAB      TAB  1   Ectopic      Multiple      Live Births  1           Family History  Problem Relation Age of Onset  . Diabetes Mother   . Cancer Father        lung cancer  . Kidney disease Father   . Seizures Father   . Cancer Brother        colon  . Heart attack Brother   . Stroke Brother   . Colon cancer Neg Hx   . Stomach cancer Neg Hx     Social History   Tobacco Use  . Smoking status: Former Smoker    Types: Cigarettes  . Smokeless tobacco: Never Used  Substance Use Topics  . Alcohol use: No  . Drug use: No    Home Medications Prior to Admission medications   Medication Sig Start Date End Date Taking? Authorizing Provider  acetaminophen (TYLENOL) 650 MG CR tablet Take 650 mg by mouth every 8 (eight) hours as needed for pain.    [provider]  amLODipine (NORVASC) 5 MG tablet TAKE 1 TABLET BY MOUTH  DAILY 02/23/19   [name], NP  aspirin 81 MG chewable tablet  Chew 81 mg by mouth daily.      [provider]  blood glucose meter kit and supplies KIT Dispense based on patient and insurance preference. Use up to four times daily as directed. (FOR ICD-9 250.00, 250.01). 02/10/19   [name], NP  Calcium Carbonate-Vitamin D (CALTRATE 600+D) 600-400 MG-UNIT per tablet Take 1 tablet by mouth 2 (two) times daily.    [provider]  Ferrous Gluconate (IRON) 240 (27 FE) MG TABS Take 1 tablet by mouth daily.      [provider]  lisinopril (ZESTRIL) 40 MG tablet Take 1 tablet (40 mg total) by mouth daily. 12/24/18   [name], NP  meloxicam (MOBIC) 7.5 MG tablet Take 1 tablet by mouth once daily 12/24/18   [name], NP  metoprolol succinate (TOPROL-XL) 25 MG 24 hr tablet Take 1 tablet (25 mg total) by mouth daily. 12/24/18   [name], NP    Allergies    Aldactone [spironolactone]  Review of Systems   Review of Systems    Constitutional: Negative for chills and fever.  HENT: Negative for congestion, rhinorrhea and sore throat.   Eyes: Negative for visual disturbance.  Respiratory: Negative for cough and shortness of breath.   Cardiovascular: Negative for chest pain and leg swelling.  Gastrointestinal: Positive for abdominal pain, nausea and vomiting. Negative for diarrhea.  Genitourinary: Negative for dysuria.  Musculoskeletal: Negative for back pain and neck pain.  Skin: Negative for rash.  Neurological: Negative for dizziness, light-headedness and headaches.  Hematological: Does not bruise/bleed easily.  Psychiatric/Behavioral: Negative for confusion.    Physical Exam Updated Vital Signs BP (!) 149/68 (BP Location: Right Arm)   Pulse (!) 115   Temp 98.2 F (36.8 C) (Oral)   Resp 15   Ht 1.588 m (5' 2.5")   Wt 83.4 kg   SpO2 98%   BMI 33.10 kg/m   Physical Exam Vitals and nursing note reviewed.  Constitutional:      General: She is not in acute distress.    Appearance: Normal appearance. She is well-developed.  HENT:     Head: Normocephalic and atraumatic.  Eyes:     Conjunctiva/sclera: Conjunctivae normal.     Pupils: Pupils are equal, round, and reactive to light.  Cardiovascular:     Rate and Rhythm: Normal rate and regular rhythm.     Heart sounds: No murmur.  Pulmonary:     Effort: Pulmonary effort is normal. No respiratory distress.     Breath sounds: Normal breath sounds.  Abdominal:     Palpations: Abdomen is soft.     Tenderness: There is abdominal tenderness. There is guarding.     Comments: Right lower quadrant  Musculoskeletal:        General: No swelling. Normal range of motion.     Cervical back: Normal range of motion and neck supple.  Skin:    General: Skin is warm and dry.     Capillary Refill: Capillary refill takes less than 2 seconds.  Neurological:     General: No focal deficit present.     Mental Status: She is alert and oriented to person, place, and  time.     Cranial Nerves: No cranial nerve deficit.     Sensory: No sensory deficit.     Motor: No weakness.     ED Results / Procedures / Treatments   Labs (all labs ordered are listed, but only abnormal results are displayed) Labs Reviewed  CBC WITH DIFFERENTIAL/PLATELET -  Abnormal; Notable for the following components:      Result Value   WBC 18.2 (*)    Neutro Abs 14.1 (*)    Monocytes Absolute 1.6 (*)    Abs Immature Granulocytes 0.12 (*)    All other components within normal limits  COMPREHENSIVE METABOLIC PANEL - Abnormal; Notable for the following components:   Glucose, Bld 157 (*)    Total Protein 8.3 (*)    All other components within normal limits  SARS CORONAVIRUS 2 AG (30 MIN TAT)  LIPASE, BLOOD    EKG EKG Interpretation  Date/Time:  Tuesday April 01 2019 20:58:23 EST Ventricular Rate:  95 PR Interval:    QRS Duration: 96 QT Interval:  349 QTC Calculation: 439 R Axis:   -4 Text Interpretation: Sinus rhythm Borderline prolonged PR interval Low voltage, precordial leads Probable anteroseptal infarct, old Confirmed by Fredia Sorrow [phone]) on 04/01/2019 9:05:00 PM   Radiology CT Abdomen Pelvis W Contrast  Result Date: 04/01/2019 CLINICAL DATA:  69 year old female with right-sided abdominal pain and diarrhea. EXAM: CT ABDOMEN AND PELVIS WITH CONTRAST TECHNIQUE: Multidetector CT imaging of the abdomen and pelvis was performed using the standard protocol following bolus administration of intravenous contrast. CONTRAST:  155m OMNIPAQUE IOHEXOL 300 MG/ML  SOLN COMPARISON:  Pelvic ultrasound dated 10/01/2018. FINDINGS: Lower chest: Minimal right lung base atelectasis. The visualized lung bases are otherwise clear. No intra-abdominal free air.  Small free fluid within the pelvis. Hepatobiliary: Multiple hepatic hypodense lesions measuring up to 2.2 cm in the left lobe of the liver. The larger lesions demonstrate fluid attenuation most consistent with cysts and the  smaller lesions are not characterized. There is background of fatty infiltration of the liver. No intrahepatic biliary ductal dilatation. The gallbladder is unremarkable. Pancreas: Unremarkable. No pancreatic ductal dilatation or surrounding inflammatory changes. Spleen: Normal in size without focal abnormality. Adrenals/Urinary Tract: The adrenal glands are unremarkable. There is a 2 mm nonobstructing right renal interpolar calculus. There is no hydronephrosis on either side. There is symmetric enhancement and excretion of contrast by both kidneys. The visualized ureters and urinary bladder appear unremarkable. Stomach/Bowel: There is no bowel obstruction. The appendix is enlarged and inflamed. A 4.5 x 3.0 cm fluid containing collection in the right lower quadrant at the tip of the appendix is concerning for loculated collection or contained perforation versus abscess. Several appendicoliths noted within this collection. The appendix is located in the right lower quadrant posterior to the cecum and measures approximately 13 mm in thickness. Vascular/Lymphatic: There is mild aortoiliac atherosclerotic disease. The IVC is unremarkable. No portal venous gas. There is no adenopathy. Reproductive: There is a 2 cm posterior uterine fibroid. Bilateral ovarian follicles/cysts measure up to 3 cm on the right. Other: None Musculoskeletal: No acute or significant osseous findings. IMPRESSION: 1. Acute appendicitis with findings most consistent with contained/loculated perforation or developing abscess containing several appendicoliths at the tip of the appendix. 2.  Aortic Atherosclerosis (ICD10-I70.0). Electronically Signed   By: AAnner CreteM.D.   On: 04/01/2019 20:34   DG Chest Port 1 View  Result Date: 04/01/2019 CLINICAL DATA:  Abdominal pain and diarrhea EXAM: PORTABLE CHEST 1 VIEW COMPARISON:  11/25/2010 FINDINGS: Mild right lower lobe atelectasis/infiltrate. Left lung clear. No heart failure, edema, or  effusion. IMPRESSION: Mild right lower lobe atelectasis/infiltrate. Electronically Signed   By: CFranchot GalloM.D.   On: 04/01/2019 19:20    Procedures Procedures (including critical care time)  Medications Ordered in ED Medications  0.9 %  sodium chloride infusion (has no administration in time range)  piperacillin-tazobactam (ZOSYN) IVPB 3.375 g (3.375 g Intravenous New Bag/Given 04/01/19 2054)  0.9 %  sodium chloride infusion ( Intravenous New Bag/Given 04/01/19 2053)  iohexol (OMNIPAQUE) 300 MG/ML solution 100 mL (100 mLs Intravenous Contrast Given 04/01/19 2016)    ED Course  I have reviewed the triage vital signs and the nursing notes.  Pertinent labs & imaging results that were available during my care of the patient were reviewed by me and considered in my medical decision making (see chart for details).    MDM Rules/Calculators/A&P                       Work-up significant for leukocytosis white blood cell count 18,000.  Patient's vital signs here other than heart rate up around 115 without any significant abnormalities oxygen saturation 98%.  Blood pressure 149/68.  Temp 98.2.  Patient with tenderness right lower quadrant with guarding.  In the face of the high white count concerning for intra-abdominal process.  Original chest x-ray raise concerns about a right lower-sided pneumonia.  CT scan showed no evidence of pneumonia in the lower lung fields.  But did show evidence of appendicitis with perforation may be early abscess formation.  Patient does not have any generalized peritoneal type findings.  But does have localized peritonitis on exam.  Patient be made n.p.o. given some IV fluids EKG will be obtained for admission to general surgery.  Patient given Zosyn for IV antibiotic.  Patient's point-of-care Covid testing was negative.    Final Clinical Impression(s) / ED Diagnoses Final diagnoses:  Acute appendicitis with perforation and localized peritonitis, unspecified  whether abscess present, unspecified whether gangrene present    Rx / DC Orders ED Discharge Orders    None       Fredia Sorrow, MD 04/01/19 2054    Fredia Sorrow, MD 04/01/19 2105   Dr. Brantley Stage once patient transferred to the [facility] emergency department.  We will make arrangements.  Discussed with Dr. Sharen Hones [facility] emergency department.  Patient will go there for Dr. Brantley Stage to see her in the emergency department.    Fredia Sorrow, MD 04/01/19 2200

## 2019-04-02 ENCOUNTER — Inpatient Hospital Stay (HOSPITAL_COMMUNITY): Payer: Medicare Other

## 2019-04-02 DIAGNOSIS — R7303 Prediabetes: Secondary | ICD-10-CM | POA: Diagnosis present

## 2019-04-02 DIAGNOSIS — Z87891 Personal history of nicotine dependence: Secondary | ICD-10-CM | POA: Diagnosis not present

## 2019-04-02 DIAGNOSIS — K381 Appendicular concretions: Secondary | ICD-10-CM | POA: Diagnosis present

## 2019-04-02 DIAGNOSIS — Z801 Family history of malignant neoplasm of trachea, bronchus and lung: Secondary | ICD-10-CM | POA: Diagnosis not present

## 2019-04-02 DIAGNOSIS — Z86010 Personal history of colonic polyps: Secondary | ICD-10-CM | POA: Diagnosis not present

## 2019-04-02 DIAGNOSIS — Z841 Family history of disorders of kidney and ureter: Secondary | ICD-10-CM | POA: Diagnosis not present

## 2019-04-02 DIAGNOSIS — K3533 Acute appendicitis with perforation and localized peritonitis, with abscess: Secondary | ICD-10-CM | POA: Diagnosis present

## 2019-04-02 DIAGNOSIS — E039 Hypothyroidism, unspecified: Secondary | ICD-10-CM | POA: Diagnosis present

## 2019-04-02 DIAGNOSIS — Z7982 Long term (current) use of aspirin: Secondary | ICD-10-CM | POA: Diagnosis not present

## 2019-04-02 DIAGNOSIS — Z8249 Family history of ischemic heart disease and other diseases of the circulatory system: Secondary | ICD-10-CM | POA: Diagnosis not present

## 2019-04-02 DIAGNOSIS — Z79899 Other long term (current) drug therapy: Secondary | ICD-10-CM | POA: Diagnosis not present

## 2019-04-02 DIAGNOSIS — K3532 Acute appendicitis with perforation and localized peritonitis, without abscess: Secondary | ICD-10-CM | POA: Diagnosis present

## 2019-04-02 DIAGNOSIS — I10 Essential (primary) hypertension: Secondary | ICD-10-CM | POA: Diagnosis present

## 2019-04-02 DIAGNOSIS — Z20822 Contact with and (suspected) exposure to covid-19: Secondary | ICD-10-CM | POA: Diagnosis present

## 2019-04-02 DIAGNOSIS — Z823 Family history of stroke: Secondary | ICD-10-CM | POA: Diagnosis not present

## 2019-04-02 DIAGNOSIS — Z8672 Personal history of thrombophlebitis: Secondary | ICD-10-CM | POA: Diagnosis not present

## 2019-04-02 DIAGNOSIS — F329 Major depressive disorder, single episode, unspecified: Secondary | ICD-10-CM | POA: Diagnosis present

## 2019-04-02 DIAGNOSIS — E876 Hypokalemia: Secondary | ICD-10-CM | POA: Diagnosis present

## 2019-04-02 DIAGNOSIS — Z888 Allergy status to other drugs, medicaments and biological substances status: Secondary | ICD-10-CM | POA: Diagnosis not present

## 2019-04-02 DIAGNOSIS — B964 Proteus (mirabilis) (morganii) as the cause of diseases classified elsewhere: Secondary | ICD-10-CM | POA: Diagnosis present

## 2019-04-02 DIAGNOSIS — Z791 Long term (current) use of non-steroidal anti-inflammatories (NSAID): Secondary | ICD-10-CM | POA: Diagnosis not present

## 2019-04-02 DIAGNOSIS — M1990 Unspecified osteoarthritis, unspecified site: Secondary | ICD-10-CM | POA: Diagnosis present

## 2019-04-02 DIAGNOSIS — Z833 Family history of diabetes mellitus: Secondary | ICD-10-CM | POA: Diagnosis not present

## 2019-04-02 HISTORY — DX: Acute appendicitis with perforation, localized peritonitis, and gangrene, without abscess: K35.32

## 2019-04-02 LAB — COMPREHENSIVE METABOLIC PANEL
ALT: 15 U/L (ref 0–44)
AST: 15 U/L (ref 15–41)
Albumin: 3.3 g/dL — ABNORMAL LOW (ref 3.5–5.0)
Alkaline Phosphatase: 77 U/L (ref 38–126)
Anion gap: 11 (ref 5–15)
BUN: 10 mg/dL (ref 8–23)
CO2: 26 mmol/L (ref 22–32)
Calcium: 8.7 mg/dL — ABNORMAL LOW (ref 8.9–10.3)
Chloride: 104 mmol/L (ref 98–111)
Creatinine, Ser: 0.88 mg/dL (ref 0.44–1.00)
GFR calc Af Amer: >60 mL/min (ref >60)
GFR calc non Af Amer: >60 mL/min (ref >60)
Glucose, Bld: 148 mg/dL — ABNORMAL HIGH (ref 70–99)
Potassium: 3.5 mmol/L (ref 3.5–5.1)
Sodium: 141 mmol/L (ref 135–145)
Total Bilirubin: 0.6 mg/dL (ref 0.3–1.2)
Total Protein: 7.4 g/dL (ref 6.5–8.1)

## 2019-04-02 LAB — CBC
HCT: 36.6 % (ref 36.0–46.0)
Hemoglobin: 11.4 g/dL — ABNORMAL LOW (ref 12.0–15.0)
MCH: 27.3 pg (ref 26.0–34.0)
MCHC: 31.1 g/dL (ref 30.0–36.0)
MCV: 87.6 fL (ref 80.0–100.0)
Platelets: 213 10*3/uL (ref 150–400)
RBC: 4.18 MIL/uL (ref 3.87–5.11)
RDW: 13.7 % (ref 11.5–15.5)
WBC: 14.1 10*3/uL — ABNORMAL HIGH (ref 4.0–10.5)
nRBC: 0 % (ref 0.0–0.2)

## 2019-04-02 LAB — PROTIME-INR
INR: 1.2 (ref 0.8–1.2)
Prothrombin Time: 15.6 seconds — ABNORMAL HIGH (ref 11.4–15.2)

## 2019-04-02 LAB — HIV ANTIBODY (ROUTINE TESTING W REFLEX): HIV Screen 4th Generation wRfx: NONREACTIVE

## 2019-04-02 MED ORDER — FENTANYL CITRATE (PF) 100 MCG/2ML IJ SOLN
INTRAMUSCULAR | Status: AC
  Filled 2019-04-02: qty 2

## 2019-04-02 MED ORDER — MIDAZOLAM HCL 2 MG/2ML IJ SOLN
INTRAMUSCULAR | Status: AC
  Filled 2019-04-02: qty 2

## 2019-04-02 MED ORDER — ONDANSETRON HCL 4 MG/2ML IJ SOLN
4.0000 mg | Freq: Four times a day (QID) | INTRAMUSCULAR | Status: DC | PRN
  Administered 2019-04-05 – 2019-04-06 (×3): 4 mg via INTRAVENOUS
  Filled 2019-04-02 (×3): qty 2

## 2019-04-02 MED ORDER — ENOXAPARIN SODIUM 40 MG/0.4ML ~~LOC~~ SOLN
40.0000 mg | Freq: Every day | SUBCUTANEOUS | Status: DC
  Filled 2019-04-02: qty 0.4

## 2019-04-02 MED ORDER — LISINOPRIL 20 MG PO TABS
40.0000 mg | ORAL_TABLET | Freq: Every day | ORAL | Status: DC
  Administered 2019-04-02 – 2019-04-06 (×5): 40 mg via ORAL
  Filled 2019-04-02 (×5): qty 2

## 2019-04-02 MED ORDER — HYDROMORPHONE HCL 1 MG/ML IJ SOLN
0.5000 mg | Freq: Once | INTRAMUSCULAR | Status: AC
  Administered 2019-04-02: 0.5 mg via INTRAVENOUS
  Filled 2019-04-02: qty 1

## 2019-04-02 MED ORDER — SODIUM CHLORIDE 0.9 % IV SOLN
INTRAVENOUS | Status: DC | PRN
  Administered 2019-04-02: 250 mL via INTRAVENOUS

## 2019-04-02 MED ORDER — AMLODIPINE BESYLATE 5 MG PO TABS
5.0000 mg | ORAL_TABLET | Freq: Every day | ORAL | Status: DC
  Administered 2019-04-02 – 2019-04-06 (×5): 5 mg via ORAL
  Filled 2019-04-02 (×5): qty 1

## 2019-04-02 MED ORDER — LIDOCAINE HCL (PF) 1 % IJ SOLN
INTRAMUSCULAR | Status: AC | PRN
  Administered 2019-04-02: 10 mL

## 2019-04-02 MED ORDER — NALOXONE HCL 0.4 MG/ML IJ SOLN
INTRAMUSCULAR | Status: AC
  Filled 2019-04-02: qty 1

## 2019-04-02 MED ORDER — DEXTROSE-NACL 5-0.9 % IV SOLN: INTRAVENOUS | Status: DC

## 2019-04-02 MED ORDER — METOPROLOL SUCCINATE ER 25 MG PO TB24
25.0000 mg | ORAL_TABLET | Freq: Every day | ORAL | Status: DC
  Administered 2019-04-02 – 2019-04-06 (×5): 25 mg via ORAL
  Filled 2019-04-02 (×5): qty 1

## 2019-04-02 MED ORDER — SODIUM CHLORIDE 0.9 % IV SOLN: INTRAVENOUS | Status: DC

## 2019-04-02 MED ORDER — SODIUM CHLORIDE 0.9 % IV SOLN
INTRAVENOUS | Status: AC
  Filled 2019-04-02: qty 1000

## 2019-04-02 MED ORDER — PIPERACILLIN-TAZOBACTAM 3.375 G IVPB
3.3750 g | Freq: Three times a day (TID) | INTRAVENOUS | Status: DC
  Administered 2019-04-02 – 2019-04-06 (×13): 3.375 g via INTRAVENOUS
  Filled 2019-04-02 (×14): qty 50

## 2019-04-02 MED ORDER — HYDROMORPHONE HCL 1 MG/ML IJ SOLN
1.0000 mg | INTRAMUSCULAR | Status: DC | PRN
  Administered 2019-04-02 – 2019-04-03 (×5): 1 mg via INTRAVENOUS
  Filled 2019-04-02 (×6): qty 1

## 2019-04-02 MED ORDER — FLUMAZENIL 0.5 MG/5ML IV SOLN
INTRAVENOUS | Status: AC
  Filled 2019-04-02: qty 5

## 2019-04-02 MED ORDER — FENTANYL CITRATE (PF) 100 MCG/2ML IJ SOLN
INTRAMUSCULAR | Status: AC | PRN
  Administered 2019-04-02: 50 ug via INTRAVENOUS

## 2019-04-02 MED ORDER — ENOXAPARIN SODIUM 40 MG/0.4ML ~~LOC~~ SOLN
40.0000 mg | Freq: Every day | SUBCUTANEOUS | Status: DC
  Administered 2019-04-03 – 2019-04-06 (×4): 40 mg via SUBCUTANEOUS
  Filled 2019-04-02 (×4): qty 0.4

## 2019-04-02 MED ORDER — MIDAZOLAM HCL 2 MG/2ML IJ SOLN
INTRAMUSCULAR | Status: AC | PRN
  Administered 2019-04-02 (×2): 1 mg via INTRAVENOUS

## 2019-04-02 MED ORDER — ONDANSETRON 4 MG PO TBDP
4.0000 mg | ORAL_TABLET | Freq: Four times a day (QID) | ORAL | Status: DC | PRN
  Filled 2019-04-02: qty 1

## 2019-04-02 NOTE — Procedures (Signed)
appy abscess  S/p RLQ APPY ABSCESS DRAIN  No comp Stable ebl min cx sent Keep to suction bulb Full report in pacs

## 2019-04-02 NOTE — H&P (Signed)
Cassidy Harrell is an 69 y.o. female.   Chief Complaint: Abdominal pain HPI: Patient transferred from the [facility] ER due to abdominal pain.  She was seen at the earlier yesterday due to a 2-day history of abdominal pain.  Is been hurting for the last 2 days or so she states.  Pain location is right lower quadrant of her abdomen.  There is no radiation of the pain is made worse with movement.  The pain is moderate to severe made better with rest.  A CT scan was obtained which shows a perforated appendix with a periappendiceal abscess containing appendicoliths.  Her pain is still present but controlled.  She has no signs of hemodynamic instability.  She complains of the pain being a 5-6 out of 10 location right lower quadrant dull and achy in nature without radiation  Past Medical History:  Diagnosis Date  . Arthritis   . Depression   . Gum disease   . Heart murmur   . History of syphilis   . Hypertension   . Irregular heart beat   . Personal history of colonic polyps   . Phlebitis   . Pre-diabetes     Past Surgical History:  Procedure Laterality Date  . SHOULDER ARTHROSCOPY Right 12/2015  . SPINE SURGERY  08/20/2015   bulging / herniated disc in C-spine  . THERAPEUTIC ABORTION  1970. 1974, 1978    Family History  Problem Relation Age of Onset  . Diabetes Mother   . Cancer Father        lung cancer  . Kidney disease Father   . Seizures Father   . Cancer Brother        colon  . Heart attack Brother   . Stroke Brother   . Colon cancer Neg Hx   . Stomach cancer Neg Hx    Social History:  reports that she has quit smoking. Her smoking use included cigarettes. She has never used smokeless tobacco. She reports that she does not drink alcohol or use drugs.  Allergies:  Allergies  Allergen Reactions  . Aldactone [Spironolactone] Other (See Comments)    itching    (Not in a hospital admission)   Results for orders placed or performed during the hospital encounter of  04/01/19 (from the past 48 hour(s))  CBC with Differential/Platelet     Status: Abnormal   Collection Time: 04/01/19  7:04 PM  Result Value Ref Range   WBC 18.2 (H) 4.0 - 10.5 K/uL   RBC 4.69 3.87 - 5.11 MIL/uL   Hemoglobin 12.8 12.0 - 15.0 g/dL   HCT 40.4 36.0 - 46.0 %   MCV 86.1 80.0 - 100.0 fL   MCH 27.3 26.0 - 34.0 pg   MCHC 31.7 30.0 - 36.0 g/dL   RDW 13.6 11.5 - 15.5 %   Platelets 236 150 - 400 K/uL   nRBC 0.0 0.0 - 0.2 %   Neutrophils Relative % 77 %   Neutro Abs 14.1 (H) 1.7 - 7.7 K/uL   Lymphocytes Relative 13 %   Lymphs Abs 2.3 0.7 - 4.0 K/uL   Monocytes Relative 9 %   Monocytes Absolute 1.6 (H) 0.1 - 1.0 K/uL   Eosinophils Relative 0 %   Eosinophils Absolute 0.0 0.0 - 0.5 K/uL   Basophils Relative 0 %   Basophils Absolute 0.0 0.0 - 0.1 K/uL   Immature Granulocytes 1 %   Abs Immature Granulocytes 0.12 (H) 0.00 - 0.07 K/uL    Comment: Performed  at [facility], [address] [place], [place] [address]  Comprehensive metabolic panel     Status: Abnormal   Collection Time: 04/01/19  7:04 PM  Result Value Ref Range   Sodium 136 135 - 145 mmol/L   Potassium 3.5 3.5 - 5.1 mmol/L   Chloride 102 98 - 111 mmol/L   CO2 23 22 - 32 mmol/L   Glucose, Bld 157 (H) 70 - 99 mg/dL   BUN 11 8 - 23 mg/dL   Creatinine, Ser 0.97 0.44 - 1.00 mg/dL   Calcium 9.0 8.9 - 10.3 mg/dL   Total Protein 8.3 (H) 6.5 - 8.1 g/dL   Albumin 3.7 3.5 - 5.0 g/dL   AST 18 15 - 41 U/L   ALT 20 0 - 44 U/L   Alkaline Phosphatase 81 38 - 126 U/L   Total Bilirubin 0.4 0.3 - 1.2 mg/dL   GFR calc non Af Amer >60 >60 mL/min   GFR calc Af Amer >60 >60 mL/min   Anion gap 11 5 - 15    Comment: Performed at [facility], [address] [place], [place] [address]  Lipase, blood     Status: None   Collection Time: 04/01/19  7:04 PM  Result Value Ref Range   Lipase 22 11 - 51 U/L    Comment: Performed at [facility], [address] [place], [place] [address]  SARS  Coronavirus 2 Ag (30 min TAT) - Nasal Swab (BD Veritor Kit)     Status: None   Collection Time: 04/01/19  7:31 PM   Specimen: Nasal Swab (BD Veritor Kit)  Result Value Ref Range   SARS Coronavirus 2 Ag NEGATIVE NEGATIVE    Comment: (NOTE) SARS-CoV-2 antigen NOT DETECTED.  Negative results are presumptive.  Negative results do not preclude SARS-CoV-2 infection and should not be used as the sole basis for treatment or other patient management decisions, including infection  control decisions, particularly in the presence of clinical signs and  symptoms consistent with COVID-19, or in those who have been in contact with the virus.  Negative results must be combined with clinical observations, patient history, and epidemiological information. The expected result is Negative. Fact Sheet for Patients: PodPark.tn Fact Sheet for Healthcare Providers: GiftContent.is This test is not yet approved or cleared by the [place] FDA and  has been authorized for detection and/or diagnosis of SARS-CoV-2 by FDA under an Emergency Use Authorization (EUA).  This EUA will remain in effect (meaning this test can be used) for the duration of  the COVID-19 de claration under Section 564(b)(1) of the Act, 21 U.S.C. section 360bbb-3(b)(1), unless the authorization is terminated or revoked sooner. Performed at [facility], [address] [place], [place] [address]   SARS Coronavirus 2 by RT PCR (hospital order, performed in [facility] hospital lab) Nasopharyngeal Nasopharyngeal Swab     Status: None   Collection Time: 04/01/19 10:20 PM   Specimen: Nasopharyngeal Swab  Result Value Ref Range   SARS Coronavirus 2 NEGATIVE NEGATIVE    Comment: Performed at [facility], [address], [place], [place] [address]   CT Abdomen Pelvis W Contrast  Result Date: 04/01/2019 CLINICAL DATA:  69 year old female with right-sided  abdominal pain and diarrhea. EXAM: CT ABDOMEN AND PELVIS WITH CONTRAST TECHNIQUE: Multidetector CT imaging of the abdomen and pelvis was performed using the standard protocol following bolus administration of intravenous contrast. CONTRAST:  159m OMNIPAQUE IOHEXOL 300 MG/ML  SOLN COMPARISON:  Pelvic ultrasound dated 10/01/2018. FINDINGS: Lower chest: Minimal right lung base atelectasis. The visualized lung bases are otherwise clear. No intra-abdominal free air.  Small free fluid within the pelvis. Hepatobiliary: Multiple hepatic hypodense lesions measuring up to 2.2 cm in the left lobe of the liver. The larger lesions demonstrate fluid attenuation most consistent with cysts and the smaller lesions are not characterized. There is background of fatty infiltration of the liver. No intrahepatic biliary ductal dilatation. The gallbladder is unremarkable. Pancreas: Unremarkable. No pancreatic ductal dilatation or surrounding inflammatory changes. Spleen: Normal in size without focal abnormality. Adrenals/Urinary Tract: The adrenal glands are unremarkable. There is a 2 mm nonobstructing right renal interpolar calculus. There is no hydronephrosis on either side. There is symmetric enhancement and excretion of contrast by both kidneys. The visualized ureters and urinary bladder appear unremarkable. Stomach/Bowel: There is no bowel obstruction. The appendix is enlarged and inflamed. A 4.5 x 3.0 cm fluid containing collection in the right lower quadrant at the tip of the appendix is concerning for loculated collection or contained perforation versus abscess. Several appendicoliths noted within this collection. The appendix is located in the right lower quadrant posterior to the cecum and measures approximately 13 mm in thickness. Vascular/Lymphatic: There is mild aortoiliac atherosclerotic disease. The IVC is unremarkable. No portal venous gas. There is no adenopathy. Reproductive: There is a 2 cm posterior uterine fibroid.  Bilateral ovarian follicles/cysts measure up to 3 cm on the right. Other: None Musculoskeletal: No acute or significant osseous findings. IMPRESSION: 1. Acute appendicitis with findings most consistent with contained/loculated perforation or developing abscess containing several appendicoliths at the tip of the appendix. 2.  Aortic Atherosclerosis (ICD10-I70.0). Electronically Signed   By: AAnner CreteM.D.   On: 04/01/2019 20:34   DG Chest Port 1 View  Result Date: 04/01/2019 CLINICAL DATA:  Abdominal pain and diarrhea EXAM: PORTABLE CHEST 1 VIEW COMPARISON:  11/25/2010 FINDINGS: Mild right lower lobe atelectasis/infiltrate. Left lung clear. No heart failure, edema, or effusion. IMPRESSION: Mild right lower lobe atelectasis/infiltrate. Electronically Signed   By: CFranchot GalloM.D.   On: 04/01/2019 19:20    Review of Systems  Constitutional: Negative.   HENT: Negative.   Eyes: Negative.   Respiratory: Negative.   Cardiovascular: Negative.   Gastrointestinal: Positive for abdominal pain.  Endocrine: Negative.   Genitourinary: Negative.   Musculoskeletal: Negative.   Neurological: Negative.   Hematological: Negative.   Psychiatric/Behavioral: Negative.     Blood pressure (!) 146/69, pulse 92, temperature 98.2 F (36.8 C), temperature source Oral, resp. rate 17, height 5' 2.5" (1.588 m), weight 83.4 kg, SpO2 98 %. Physical Exam  Constitutional: She is oriented to person, place, and time. She appears well-developed and well-nourished.  HENT:  Head: Normocephalic and atraumatic.  Eyes: Pupils are equal, round, and reactive to light. EOM are normal.  Cardiovascular: Normal rate and regular rhythm.  Respiratory: Effort normal and breath sounds normal.  GI: There is abdominal tenderness in the right lower quadrant. There is tenderness at McBurney's point.  Musculoskeletal:        General: Normal range of motion.     Cervical back: Normal range of motion and neck supple.  Neurological:  She is alert and oriented to person, place, and time.  Skin: Skin is warm and dry.  Psychiatric: She has a normal mood and affect. Her behavior is normal. Thought content normal.     Assessment/Plan Perforated appendicitis with abscess/phlegmon containing appendicoliths  Admit for IV fluids, IV antibiotics, allow clear liquid diet for now consult interventional radiology in a.m. to see if collection is drainable.  If not, may require operative intervention nonoperative management recommended for now given perforated state and abscess.  Discussed rationale for management, operative and nonoperative management strategies, the pros and cons of each and complications of each at this point time.  Patient Active Problem List   Diagnosis Date Noted  . Chest pain 06/30/2015  . Heart palpitations 06/30/2015  . Hypokalemia 06/07/2015  . Cellulitis and abscess of trunk 11/05/2014  . Myalgia and myositis 05/28/2014  . Allergic rhinitis 05/28/2014  . Thyromegaly 11/24/2013  . Foot pain 11/24/2013  . Anemia, iron deficiency 08/25/2013  . Diabetes type 2, controlled ([place]) 08/25/2013  . Other malaise and fatigue 10/14/2012  . Cervical disc disease 06/06/2012  . Routine general medical examination at a health care facility 11/13/2011  . Hypothyroid 10/24/2011  . Arthritis 09/25/2011  . Depression 09/25/2011  . History of syphilis 09/25/2011  . HTN (hypertension) 09/25/2011    Turner Daniels, MD 04/02/2019, 1:23 AM

## 2019-04-02 NOTE — ED Provider Notes (Signed)
Patient transferred from outside facility with right lower quadrant pain was found to have perforated appendicitis.  She is stable on arrival complaining of some right-sided pain.  Labs and CT imaging reviewed.  General surgery will be notified of her arrival.  She did receive Zosyn prior to arrival.  D/w Dr. Brantley Stage.   Ezequiel Essex, MD 04/02/19 [phone]

## 2019-04-02 NOTE — Progress Notes (Signed)
[facility]/Trauma Progress Note      Assessment/Plan HTN - home meds Depression Pre-diabetes  Perforated appendicitis with abscess/phlegmon containing appendicoliths - IR consult pending - continue bowel rest, IVF, IV antibiotics - would prefer non op management at this time  FEN: CLD VTE: SCD's, lovenox ID: Zosyn 02/02   WBC 14.1, afebrile Foley: none Follow up: TBD    LOS: 0 days    Subjective: CC: abdominal pain   Pain about the same as yesterday. Some nausea but no vomiting since Monday. Having flatus. Discussed IR drain and why we would prefer to avoid surgery at this time. Discussed atelectasis seen on CXR. Nurse at bedside.   Objective: Vital signs in last 24 hours: Temp:  [98 F (36.7 C)-98.3 F (36.8 C)] 98 F (36.7 C) (02/03 0551) Pulse Rate:  [82-115] 91 (02/03 0551) Resp:  [15-20] 20 (02/03 0551) BP: (118-156)/(58-85) 145/66 (02/03 0551) SpO2:  [94 %-98 %] 94 % (02/03 0551) Weight:  [83.4 kg] 83.4 kg (02/02 1849) Last BM Date: 04/01/19  Intake/Output from previous day: 02/02 0701 - 02/03 0700 In: 1885.3 [P.O.:330; I.V.:1497.3; IV Piggyback:57.9] Out: 0  Intake/Output this shift: No intake/output data recorded.  PE:  Gen:  Alert, NAD, pleasant, cooperative Card:  RRR, no M/G/R heard Pulm:  CTA, no W/R/R, effort normal Abd: Soft, ND, +BS, no HSM, TTP of RLQ with guarding, no peritonitis  Skin: no rashes noted, warm and dry   Anti-infectives: Anti-infectives (From admission, onward)   Start     Dose/Rate Route Frequency Ordered Stop   04/02/19 0400  piperacillin-tazobactam (ZOSYN) IVPB 3.375 g     3.375 g 12.5 mL/hr over 240 Minutes Intravenous Every 8 hours 04/02/19 0353     04/01/19 2045  piperacillin-tazobactam (ZOSYN) IVPB 3.375 g     3.375 g 100 mL/hr over 30 Minutes Intravenous  Once 04/01/19 2043 04/01/19 2120      Lab Results:  Recent Labs    04/01/19 1904 04/02/19 0425  WBC 18.2* 14.1*  HGB 12.8 11.4*  HCT  40.4 36.6  PLT 236 213   BMET Recent Labs    04/01/19 1904 04/02/19 0425  NA 136 141  K 3.5 3.5  CL 102 104  CO2 23 26  GLUCOSE 157* 148*  BUN 11 10  CREATININE 0.97 0.88  CALCIUM 9.0 8.7*   PT/INR No results for input(s): LABPROT, INR in the last 72 hours. CMP     Component Value Date/Time   NA 141 04/02/2019 0425   K 3.5 04/02/2019 0425   CL 104 04/02/2019 0425   CO2 26 04/02/2019 0425   GLUCOSE 148 (H) 04/02/2019 0425   BUN 10 04/02/2019 0425   CREATININE 0.88 04/02/2019 0425   CREATININE 0.72 02/08/2018 1400   CALCIUM 8.7 (L) 04/02/2019 0425   PROT 7.4 04/02/2019 0425   ALBUMIN 3.3 (L) 04/02/2019 0425   AST 15 04/02/2019 0425   ALT 15 04/02/2019 0425   ALKPHOS 77 04/02/2019 0425   BILITOT 0.6 04/02/2019 0425   GFRNONAA >60 04/02/2019 0425   GFRNONAA 78 11/13/2011 0827   GFRAA >60 04/02/2019 0425   GFRAA >89 11/13/2011 0827   Lipase     Component Value Date/Time   LIPASE 22 04/01/2019 1904    Studies/Results: CT Abdomen Pelvis W Contrast  Result Date: 04/01/2019 CLINICAL DATA:  69 year old female with right-sided abdominal pain and diarrhea. EXAM: CT ABDOMEN AND PELVIS WITH CONTRAST TECHNIQUE: Multidetector CT imaging of the abdomen and pelvis was performed using the  standard protocol following bolus administration of intravenous contrast. CONTRAST:  14mL OMNIPAQUE IOHEXOL 300 MG/ML  SOLN COMPARISON:  Pelvic ultrasound dated 10/01/2018. FINDINGS: Lower chest: Minimal right lung base atelectasis. The visualized lung bases are otherwise clear. No intra-abdominal free air.  Small free fluid within the pelvis. Hepatobiliary: Multiple hepatic hypodense lesions measuring up to 2.2 cm in the left lobe of the liver. The larger lesions demonstrate fluid attenuation most consistent with cysts and the smaller lesions are not characterized. There is background of fatty infiltration of the liver. No intrahepatic biliary ductal dilatation. The gallbladder is unremarkable.  Pancreas: Unremarkable. No pancreatic ductal dilatation or surrounding inflammatory changes. Spleen: Normal in size without focal abnormality. Adrenals/Urinary Tract: The adrenal glands are unremarkable. There is a 2 mm nonobstructing right renal interpolar calculus. There is no hydronephrosis on either side. There is symmetric enhancement and excretion of contrast by both kidneys. The visualized ureters and urinary bladder appear unremarkable. Stomach/Bowel: There is no bowel obstruction. The appendix is enlarged and inflamed. A 4.5 x 3.0 cm fluid containing collection in the right lower quadrant at the tip of the appendix is concerning for loculated collection or contained perforation versus abscess. Several appendicoliths noted within this collection. The appendix is located in the right lower quadrant posterior to the cecum and measures approximately 13 mm in thickness. Vascular/Lymphatic: There is mild aortoiliac atherosclerotic disease. The IVC is unremarkable. No portal venous gas. There is no adenopathy. Reproductive: There is a 2 cm posterior uterine fibroid. Bilateral ovarian follicles/cysts measure up to 3 cm on the right. Other: None Musculoskeletal: No acute or significant osseous findings. IMPRESSION: 1. Acute appendicitis with findings most consistent with contained/loculated perforation or developing abscess containing several appendicoliths at the tip of the appendix. 2.  Aortic Atherosclerosis (ICD10-I70.0). Electronically Signed   By: Anner Crete M.D.   On: 04/01/2019 20:34   DG Chest Port 1 View  Result Date: 04/01/2019 CLINICAL DATA:  Abdominal pain and diarrhea EXAM: PORTABLE CHEST 1 VIEW COMPARISON:  11/25/2010 FINDINGS: Mild right lower lobe atelectasis/infiltrate. Left lung clear. No heart failure, edema, or effusion. IMPRESSION: Mild right lower lobe atelectasis/infiltrate. Electronically Signed   By: Franchot Gallo M.D.   On: 04/01/2019 19:20     Kalman Drape, [facility] Surgery Please see amion for pager for the following: Cristine Polio, & Friday 7:00am - 4:30pm Thursdays 7:00am -11:30am

## 2019-04-02 NOTE — Consult Note (Signed)
Chief Complaint: Patient was seen in consultation today for CT-guided drainage of right lower abdominal/pelvic abscess Chief Complaint  Patient presents with  . Abdominal Pain    Referring Physician(s): Cornett,T  Supervising Physician: Daryll Brod  Patient Status: [facility] - In-pt  History of Present Illness: Cassidy Harrell is a 69 y.o. female with past medical history of depression, hypertension, prediabetes, colonic polyps who was admitted to [facility] as transfer from [facility] ED with several day history of abdominal pain/nausea.  Subsequent imaging revealed acute appendicitis with findings most consistent with contained/loculated perforation or developing abscess containing several appendicoliths at the tip of the appendix.  She is currently afebrile, WBC 14.1, hemoglobin 11.4, platelets 213K, creatinine 0.88, PT 15.6, INR 1.2, COVID-19 negative.  Request now received from surgery for CT-guided drainage of the right lower quadrant abscess.  Past Medical History:  Diagnosis Date  . Arthritis   . Depression   . Gum disease   . Heart murmur   . History of syphilis   . Hypertension   . Irregular heart beat   . Personal history of colonic polyps   . Phlebitis   . Pre-diabetes     Past Surgical History:  Procedure Laterality Date  . SHOULDER ARTHROSCOPY Right 12/2015  . SPINE SURGERY  08/20/2015   bulging / herniated disc in C-spine  . THERAPEUTIC ABORTION  1970. 1974, 1978    Allergies: Aldactone [spironolactone]  Medications: Prior to Admission medications   Medication Sig Start Date End Date Taking? Authorizing Provider  acetaminophen (TYLENOL) 650 MG CR tablet Take 650 mg by mouth every 8 (eight) hours as needed for pain.   Yes [provider]  amLODipine (NORVASC) 5 MG tablet TAKE 1 TABLET BY MOUTH  DAILY Patient taking differently: Take 5 mg by mouth daily.  02/23/19  Yes [name], NP  aspirin 81 MG chewable tablet Chew 81 mg  by mouth daily.     Yes [provider]  Calcium Carbonate-Vitamin D (CALTRATE 600+D) 600-400 MG-UNIT per tablet Take 1 tablet by mouth 2 (two) times daily.   Yes [provider]  Ferrous Gluconate (IRON) 240 (27 FE) MG TABS Take 1 tablet by mouth daily.     Yes [provider]  lisinopril (ZESTRIL) 40 MG tablet Take 1 tablet (40 mg total) by mouth daily. 12/24/18  Yes [name], NP  meloxicam (MOBIC) 7.5 MG tablet Take 1 tablet by mouth once daily Patient taking differently: Take 7.5 mg by mouth daily.  12/24/18  Yes [name], NP  metoprolol succinate (TOPROL-XL) 25 MG 24 hr tablet Take 1 tablet (25 mg total) by mouth daily. 12/24/18  Yes [name], NP  blood glucose meter kit and supplies KIT Dispense based on patient and insurance preference. Use up to four times daily as directed. (FOR ICD-9 250.00, 250.01). 02/10/19   [name], NP     Family History  Problem Relation Age of Onset  . Diabetes Mother   . Cancer Father        lung cancer  . Kidney disease Father   . Seizures Father   . Cancer Brother        colon  . Heart attack Brother   . Stroke Brother   . Colon cancer Neg Hx   . Stomach cancer Neg Hx     Social History   Socioeconomic History  . Marital status: Divorced    Spouse name: Not on file  . Number of children:  1  . Years of education: Not on file  . Highest education level: Not on file  Occupational History  . Not on file  Tobacco Use  . Smoking status: Former Smoker    Types: Cigarettes  . Smokeless tobacco: Never Used  Substance and Sexual Activity  . Alcohol use: No  . Drug use: No  . Sexual activity: Not on file  Other Topics Concern  . 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 Difficulty of Paying Living Expenses: Not on file  Food Insecurity:   . Worried About Charity fundraiser in the Last Year: Not on file  . Ran Out of Food in the Last Year: Not on file  Transportation Needs:   . Lack of Transportation (Medical): Not on file  . Lack of Transportation (Non-Medical): Not on file  Physical Activity:   . Days of Exercise per Week: Not on file  . Minutes of Exercise per Session: Not on file  Stress:   . Feeling of Stress : Not on file  Social Connections:   . Frequency of Communication with Friends and Family: Not on file  . Frequency of Social Gatherings with Friends and Family: Not on file  . Attends Religious Services: Not on file  . Active Member of Clubs or Organizations: Not on file  . Attends Archivist Meetings: Not on file  . Marital Status: Not on file      Review of Systems currently denies fever, headache, chest pain, dyspnea, cough, vomiting or abnormal bleeding.  She does have abdominal (primarily right lower quadrant) pain and back pain as well as intermittent nausea.  Vital Signs: BP 132/67 (BP Location: Left Arm)   Pulse 90   Temp 98 F (36.7 C) (Oral)   Resp 18   Ht 5' 2.5" (1.588 m)   Wt 183 lb 14.4 oz (83.4 kg)   SpO2 100%   BMI 33.10 kg/m   Physical Exam awake/alert; chest-  CTA bilat; heart- RRR; abd- soft, few BS, mild- mod tender RLQ; no LE edema  Imaging: US PELVIS TRANSVAGINAL NON-OB (TV ONLY)  Result Date: 03/04/2019 CLINICAL DATA:  BILATERAL ovarian cysts, postmenopausal bleeding EXAM: ULTRASOUND PELVIS TRANSVAGINAL TECHNIQUE: Transvaginal ultrasound examination of the pelvis was performed including evaluation of the uterus, ovaries, adnexal regions, and pelvic cul-de-sac. COMPARISON:   10/01/2018 FINDINGS: Uterus Measurements: 6.2 x 4.0 x 4.0 cm = volume: 53 mL. Anteverted. Subserosal leiomyoma at upper RIGHT uterus posteriorly 2.0 x 1.9 x 2.0 cm. No additional masses. Endometrium Thickness: 2 mm.  No endometrial fluid or focal abnormality Right ovary Measurements: 3.3 x 4.4 x 2.6 cm = volume: 20 mL. Cyst with a single thin septation identified within RIGHT ovary 3.3 x 2.2 x 2.9 cm, previously 3.5 x 2.4 x 2.8 cm. Left ovary Measurements: 3.0 x 2.0 x 2.5 cm = volume: 8 mL. Cyst within LEFT ovary 2.5 x 1.5 x 2.2 cm, grossly simple, less well evaluated due to  position posterior to the upper uterus, decreased in size since prior study. Other findings: No free pelvic fluid. No additional adnexal masses. IMPRESSION: Small subserosal leiomyoma posterior upper RIGHT uterus 2.0 cm diameter. Small simple LEFT ovarian cyst decreased in size since previous exam. Minimally complicated cyst with a single thin septation within RIGHT ovary little changed since previous study. Electronically Signed   By: Lavonia Dana M.D.   On: 03/04/2019 14:23   CT Abdomen Pelvis W Contrast  Result Date: 04/01/2019 CLINICAL DATA:  69 year old female with right-sided abdominal pain and diarrhea. EXAM: CT ABDOMEN AND PELVIS WITH CONTRAST TECHNIQUE: Multidetector CT imaging of the abdomen and pelvis was performed using the standard protocol following bolus administration of intravenous contrast. CONTRAST:  130m OMNIPAQUE IOHEXOL 300 MG/ML  SOLN COMPARISON:  Pelvic ultrasound dated 10/01/2018. FINDINGS: Lower chest: Minimal right lung base atelectasis. The visualized lung bases are otherwise clear. No intra-abdominal free air.  Small free fluid within the pelvis. Hepatobiliary: Multiple hepatic hypodense lesions measuring up to 2.2 cm in the left lobe of the liver. The larger lesions demonstrate fluid attenuation most consistent with cysts and the smaller lesions are not characterized. There is background of fatty infiltration of  the liver. No intrahepatic biliary ductal dilatation. The gallbladder is unremarkable. Pancreas: Unremarkable. No pancreatic ductal dilatation or surrounding inflammatory changes. Spleen: Normal in size without focal abnormality. Adrenals/Urinary Tract: The adrenal glands are unremarkable. There is a 2 mm nonobstructing right renal interpolar calculus. There is no hydronephrosis on either side. There is symmetric enhancement and excretion of contrast by both kidneys. The visualized ureters and urinary bladder appear unremarkable. Stomach/Bowel: There is no bowel obstruction. The appendix is enlarged and inflamed. A 4.5 x 3.0 cm fluid containing collection in the right lower quadrant at the tip of the appendix is concerning for loculated collection or contained perforation versus abscess. Several appendicoliths noted within this collection. The appendix is located in the right lower quadrant posterior to the cecum and measures approximately 13 mm in thickness. Vascular/Lymphatic: There is mild aortoiliac atherosclerotic disease. The IVC is unremarkable. No portal venous gas. There is no adenopathy. Reproductive: There is a 2 cm posterior uterine fibroid. Bilateral ovarian follicles/cysts measure up to 3 cm on the right. Other: None Musculoskeletal: No acute or significant osseous findings. IMPRESSION: 1. Acute appendicitis with findings most consistent with contained/loculated perforation or developing abscess containing several appendicoliths at the tip of the appendix. 2.  Aortic Atherosclerosis (ICD10-I70.0). Electronically Signed   By: AAnner CreteM.D.   On: 04/01/2019 20:34   DG Chest Port 1 View  Result Date: 04/01/2019 CLINICAL DATA:  Abdominal pain and diarrhea EXAM: PORTABLE CHEST 1 VIEW COMPARISON:  11/25/2010 FINDINGS: Mild right lower lobe atelectasis/infiltrate. Left lung clear. No heart failure, edema, or effusion. IMPRESSION: Mild right lower lobe atelectasis/infiltrate. Electronically Signed    By: CFranchot GalloM.D.   On: 04/01/2019 19:20    Labs:  CBC: Recent Labs    04/01/19 1904 04/02/19 0425  WBC 18.2* 14.1*  HGB 12.8 11.4*  HCT 40.4 36.6  PLT 236 213    COAGS: Recent Labs    04/02/19 0834  INR 1.2    BMP: Recent Labs    11/05/18 1436 04/01/19 1904 04/02/19 0425  NA 142 136 141  K 4.4 3.5 3.5  CL 106 102 104  CO2 '28 23 26  ' GLUCOSE 77 157* 148*  BUN '19 11 10  ' CALCIUM 9.4 9.0 8.7*  CREATININE 0.75 0.97 0.88  GFRNONAA  --  >  60 >60  GFRAA  --  >60 >60    LIVER FUNCTION TESTS: Recent Labs    04/01/19 1904 04/02/19 0425  BILITOT 0.4 0.6  AST 18 15  ALT 20 15  ALKPHOS 81 77  PROT 8.3* 7.4  ALBUMIN 3.7 3.3*    TUMOR MARKERS: No results for input(s): AFPTM, CEA, CA199, CHROMGRNA in the last 8760 hours.  Assessment and Plan: 69 y.o. female with past medical history of depression, hypertension, prediabetes, colonic polyps who was admitted to [facility] as transfer from [facility] ED with several day history of abdominal pain/nausea.  Subsequent imaging revealed acute appendicitis with findings most consistent with contained/loculated perforation or developing abscess containing several appendicoliths at the tip of the appendix.  She is currently afebrile, WBC 14.1, hemoglobin 11.4, platelets 213K, creatinine 0.88, PT 15.6, INR 1.2, COVID-19 negative.  Request now received from surgery for CT-guided drainage of the right lower quadrant abdomino-pelvic abscess.  Imaging studies have been reviewed by Dr. Annamaria Boots. Risks and benefits discussed with the patient including bleeding, infection, damage to adjacent structures, bowel perforation/fistula connection, and sepsis.  All of the patient's questions were answered, patient is agreeable to proceed. Consent signed and in chart.  Procedure scheduled for today   Thank you for this interesting consult.  I greatly enjoyed meeting Cassidy Harrell and look forward to participating in their care.  A  copy of this report was sent to the requesting provider on this date.  Electronically Signed: D. Rowe Robert, PA-C 04/02/2019, 8:56 AM   I spent a total of 25 minutes in face to face in clinical consultation, greater than 50% of which was counseling/coordinating care for CT-guided drainage of right lower abdominal/ pelvic abscess

## 2019-04-03 MED ORDER — GLUCERNA SHAKE PO LIQD
237.0000 mL | Freq: Three times a day (TID) | ORAL | Status: DC
  Administered 2019-04-03 – 2019-04-04 (×5): 237 mL via ORAL
  Filled 2019-04-03 (×11): qty 237

## 2019-04-03 NOTE — Progress Notes (Signed)
    CC: Abdominal pain  Subjective: She feels better this a.m. she still kind of tender right lower quadrant, or lower abdomen.  She is tolerating clears well.  IR drain is a clear serous fluid.  Objective: Vital signs in last 24 hours: Temp:  [98 F (36.7 C)-100 F (37.8 C)] 99.2 F (37.3 C) (02/04 0538) Pulse Rate:  [87-108] 87 (02/04 0538) Resp:  [15-20] 18 (02/04 0538) BP: (131-152)/(62-72) 151/68 (02/04 0538) SpO2:  [90 %-100 %] 91 % (02/04 0538) Last BM Date: 04/01/19 600 p.o. recorded 1850 IV 750 urine 170 from the drain No BM recorded T-max 99.4 heart rate 90s - 100 range BP stable No labs this a.m. Intake/Output from previous day: 02/03 0701 - 02/04 0700 In: 2456.6 [P.O.:600; I.V.:1704.8; IV Piggyback:151.8] Out: 920 [Urine:750; Drains:170] Intake/Output this shift: Total I/O In: -  Out: 200 [Urine:200]  General appearance: alert, cooperative and no distress Resp: clear to auscultation bilaterally and Few rales in the bases.  She is moving about 500 with incentive.  We went over the use of incentive at the bedside. Cardio: regular rate and rhythm, S1, S2 normal, no murmur, click, rub or gallop GI: Soft, still tender in the lower right side of the abdomen right lower quadrant over the drain site.  Positive bowel sounds positive flatus, no BM.  The drainage is serous and clear. Extremities: extremities normal, atraumatic, no cyanosis or edema  Lab Results:  Recent Labs    04/01/19 1904 04/02/19 0425  WBC 18.2* 14.1*  HGB 12.8 11.4*  HCT 40.4 36.6  PLT 236 213    BMET Recent Labs    04/01/19 1904 04/02/19 0425  NA 136 141  K 3.5 3.5  CL 102 104  CO2 23 26  GLUCOSE 157* 148*  BUN 11 10  CREATININE 0.97 0.88  CALCIUM 9.0 8.7*   PT/INR Recent Labs    04/02/19 0834  LABPROT 15.6*  INR 1.2    Recent Labs  Lab 04/01/19 1904 04/02/19 0425  AST 18 15  ALT 20 15  ALKPHOS 81 77  BILITOT 0.4 0.6  PROT 8.3* 7.4  ALBUMIN 3.7 3.3*      Lipase     Component Value Date/Time   LIPASE 22 04/01/2019 1904     Medications: . amLODipine  5 mg Oral Daily  . enoxaparin (LOVENOX) injection  40 mg Subcutaneous Daily  . lisinopril  40 mg Oral Daily  . metoprolol succinate  25 mg Oral Daily   . sodium chloride Stopped (04/02/19 0431)  . dextrose 5 % and 0.9% NaCl 100 mL/hr at 04/03/19 0250  . piperacillin-tazobactam (ZOSYN)  IV 3.375 g (04/03/19 0537)    Assessment/Plan HTN - home meds Depression Pre-diabetes  Perforated appendicitis with abscess/phlegmon containing appendicoliths - IR RLQ drain 04/30/19 - continue bowel rest, IVF, IV antibiotics - would prefer non op management at this time  FEN: CLD/IV fluids VTE: SCD's, lovenox ID: Zosyn 02/02 >> day 3   WBC  18.2>>14.1 Foley: none Follow up: [place]: We will advance her to full liquids, continue antibiotics, continue drain, ambulate, continue I-S, recheck labs in the a.m.  LOS: 1 day    Kash Davie 04/03/2019 Please see Amion

## 2019-04-03 NOTE — Progress Notes (Signed)
Referring Physician(s): Claris Gower, Fraser Din ([place])  Supervising Physician: Jacqulynn Cadet  Patient Status:  [facility] - In-pt  Chief Complaint: Pain at drain site  Subjective:  History of ruptured appendical abscess s/p RLQ drain placement in IR 04/02/2019 by Dr. Annamaria Boots. Patient awake and alert laying in bed. Complains of tenderness of drain insertion site, as expected. RLQ drain site c/d/i.   Allergies: Aldactone [spironolactone]  Medications: Prior to Admission medications   Medication Sig Start Date End Date Taking? Authorizing Provider  acetaminophen (TYLENOL) 650 MG CR tablet Take 650 mg by mouth every 8 (eight) hours as needed for pain.   Yes [provider]  amLODipine (NORVASC) 5 MG tablet TAKE 1 TABLET BY MOUTH  DAILY Patient taking differently: Take 5 mg by mouth daily.  02/23/19  Yes [name], NP  aspirin 81 MG chewable tablet Chew 81 mg by mouth daily.     Yes [provider]  Calcium Carbonate-Vitamin D (CALTRATE 600+D) 600-400 MG-UNIT per tablet Take 1 tablet by mouth 2 (two) times daily.   Yes [provider]  Ferrous Gluconate (IRON) 240 (27 FE) MG TABS Take 1 tablet by mouth daily.     Yes [provider]  lisinopril (ZESTRIL) 40 MG tablet Take 1 tablet (40 mg total) by mouth daily. 12/24/18  Yes [name], NP  meloxicam (MOBIC) 7.5 MG tablet Take 1 tablet by mouth once daily Patient taking differently: Take 7.5 mg by mouth daily.  12/24/18  Yes [name], NP  metoprolol succinate (TOPROL-XL) 25 MG 24 hr tablet Take 1 tablet (25 mg total) by mouth daily. 12/24/18  Yes [name], NP  blood glucose meter kit and supplies KIT Dispense based on patient and insurance preference. Use up to four times daily as directed. (FOR ICD-9 250.00, 250.01). 02/10/19   [name], NP     Vital Signs: BP (!) 146/65 (BP Location: Left Arm)   Pulse 88   Temp 98.9 F (37.2 C) (Oral)   Resp 16   Ht  5' 2.5" (1.588 m)   Wt 183 lb 14.4 oz (83.4 kg)   SpO2 94%   BMI 33.10 kg/m   Physical Exam Vitals and nursing note reviewed.  Constitutional:      General: She is not in acute distress.    Appearance: Normal appearance.  Pulmonary:     Effort: Pulmonary effort is normal. No respiratory distress.  Abdominal:     Comments: RLQ drain site with mild tenderness, no erythema, drainage, or active bleeding; approximately 15-20 cc dark red fluid in suction bulb; drain flushes/aspirates without resistance.  Skin:    General: Skin is warm and dry.  Neurological:     Mental Status: She is alert and oriented to person, place, and time.  Psychiatric:        Mood and Affect: Mood normal.        Behavior: Behavior normal.     Imaging: CT Abdomen Pelvis W Contrast  Result Date: 04/01/2019 CLINICAL DATA:  69 year old female with right-sided abdominal pain and diarrhea. EXAM: CT ABDOMEN AND PELVIS WITH CONTRAST TECHNIQUE: Multidetector CT imaging of the abdomen and pelvis was performed using the standard protocol following bolus administration of intravenous contrast. CONTRAST:  169m OMNIPAQUE IOHEXOL 300 MG/ML  SOLN COMPARISON:  Pelvic ultrasound dated 10/01/2018. FINDINGS: Lower chest: Minimal right lung base atelectasis. The visualized lung bases are otherwise clear. No intra-abdominal free air.  Small free fluid within the pelvis. Hepatobiliary: Multiple hepatic hypodense lesions measuring  up to 2.2 cm in the left lobe of the liver. The larger lesions demonstrate fluid attenuation most consistent with cysts and the smaller lesions are not characterized. There is background of fatty infiltration of the liver. No intrahepatic biliary ductal dilatation. The gallbladder is unremarkable. Pancreas: Unremarkable. No pancreatic ductal dilatation or surrounding inflammatory changes. Spleen: Normal in size without focal abnormality. Adrenals/Urinary Tract: The adrenal glands are unremarkable. There is a 2 mm  nonobstructing right renal interpolar calculus. There is no hydronephrosis on either side. There is symmetric enhancement and excretion of contrast by both kidneys. The visualized ureters and urinary bladder appear unremarkable. Stomach/Bowel: There is no bowel obstruction. The appendix is enlarged and inflamed. A 4.5 x 3.0 cm fluid containing collection in the right lower quadrant at the tip of the appendix is concerning for loculated collection or contained perforation versus abscess. Several appendicoliths noted within this collection. The appendix is located in the right lower quadrant posterior to the cecum and measures approximately 13 mm in thickness. Vascular/Lymphatic: There is mild aortoiliac atherosclerotic disease. The IVC is unremarkable. No portal venous gas. There is no adenopathy. Reproductive: There is a 2 cm posterior uterine fibroid. Bilateral ovarian follicles/cysts measure up to 3 cm on the right. Other: None Musculoskeletal: No acute or significant osseous findings. IMPRESSION: 1. Acute appendicitis with findings most consistent with contained/loculated perforation or developing abscess containing several appendicoliths at the tip of the appendix. 2.  Aortic Atherosclerosis (ICD10-I70.0). Electronically Signed   By: Anner Crete M.D.   On: 04/01/2019 20:34   DG Chest Port 1 View  Result Date: 04/01/2019 CLINICAL DATA:  Abdominal pain and diarrhea EXAM: PORTABLE CHEST 1 VIEW COMPARISON:  11/25/2010 FINDINGS: Mild right lower lobe atelectasis/infiltrate. Left lung clear. No heart failure, edema, or effusion. IMPRESSION: Mild right lower lobe atelectasis/infiltrate. Electronically Signed   By: Franchot Gallo M.D.   On: 04/01/2019 19:20   CT IMAGE GUIDED DRAINAGE BY PERCUTANEOUS CATHETER  Result Date: 04/02/2019 INDICATION: Right lower quadrant ruptured appendiceal abscess EXAM: CT GUIDED DRAINAGE OF RIGHT LOWER QUADRANT ABSCESS MEDICATIONS: The patient is currently admitted to the  hospital and receiving intravenous antibiotics. The antibiotics were administered within an appropriate time frame prior to the initiation of the procedure. ANESTHESIA/SEDATION: 2.0 mg IV Versed 50 mcg IV Fentanyl Moderate Sedation Time:  15 MINUTES The patient was continuously monitored during the procedure by the interventional radiology nurse under my direct supervision. COMPLICATIONS: None immediate. TECHNIQUE: Informed written consent was obtained from the patient after a thorough discussion of the procedural risks, benefits and alternatives. All questions were addressed. Maximal Sterile Barrier Technique was utilized including caps, mask, sterile gowns, sterile gloves, sterile drape, hand hygiene and skin antiseptic. A timeout was performed prior to the initiation of the procedure. PROCEDURE: Previous imaging reviewed. Patient positioned supine. Noncontrast localization CT performed. The right lower quadrant appendiceal abscess was localized and marked for a right lower quadrant lateral approach. Under sterile conditions and local anesthesia, an 18 gauge 15 cm access needle was advanced from a right lower quadrant lateral approach to the abscess. CT confirms needle position. Syringe aspiration yielded purulent fluid. Guidewire advanced. Guidewire confirmed within the abscess cavity by CT. Tract dilatation performed to insert a 10 [place] drain. Drain catheter position also confirmed with CT. Syringe aspiration yielded a total of 12 cc purulent fecal contaminated fluid. Sample sent for culture. Catheter secured with Prolene suture and connected to external suction bulb. No immediate complication. Patient tolerated the procedure well. FINDINGS: Imaging confirms  needle placed in the right lower quadrant appendiceal abscess for drain placement IMPRESSION: Successful CT-guided right lower quadrant appendiceal abscess drain insertion Electronically Signed   By: Jerilynn Mages.  Shick M.D.   On: 04/02/2019 15:12     Labs:  CBC: Recent Labs    04/01/19 1904 04/02/19 0425  WBC 18.2* 14.1*  HGB 12.8 11.4*  HCT 40.4 36.6  PLT 236 213    COAGS: Recent Labs    04/02/19 0834  INR 1.2    BMP: Recent Labs    11/05/18 1436 04/01/19 1904 04/02/19 0425  NA 142 136 141  K 4.4 3.5 3.5  CL 106 102 104  CO2 '28 23 26  ' GLUCOSE 77 157* 148*  BUN '19 11 10  ' CALCIUM 9.4 9.0 8.7*  CREATININE 0.75 0.97 0.88  GFRNONAA  --  >60 >60  GFRAA  --  >60 >60    LIVER FUNCTION TESTS: Recent Labs    04/01/19 1904 04/02/19 0425  BILITOT 0.4 0.6  AST 18 15  ALT 20 15  ALKPHOS 81 77  PROT 8.3* 7.4  ALBUMIN 3.7 3.3*    Assessment and Plan:  History of ruptured appendical abscess s/p RLQ drain placement in IR 04/02/2019 by Dr. Annamaria Boots. RLQ drain stable with approximately 15-20 cc dark red fluid in suction bulb (additional 170 cc output from drain in past 24 hours per chart). Continue current drain management- continue with Qshift flushes/monitor of output. Plan for repeat CT/possible drain injection when output <10 cc/day (assess for possible removal). Further plans per [facility]- appreciate and agree with management. IR to follow.   Electronically Signed: Earley Abide, PA-C 04/03/2019, 1:03 PM   I spent a total of 25 Minutes at the the patient's bedside AND on the patient's hospital floor or unit, greater than 50% of which was counseling/coordinating care for appendiceal abscess s/p RLQ drain placement.

## 2019-04-03 NOTE — Plan of Care (Signed)

## 2019-04-03 NOTE — Progress Notes (Signed)
Initial Nutrition Assessment  DOCUMENTATION CODES:   Obesity unspecified  INTERVENTION:   -Glucerna Shake po TID, each supplement provides 220 kcal and 10 grams of protein -Magic cup BID with meals, each supplement provides 290 kcal and 9 grams of protein  NUTRITION DIAGNOSIS:   Increased nutrient needs related to acute illness(perforated appendix) as evidenced by estimated needs.  GOAL:   Patient will meet greater than or equal to 90% of their needs  MONITOR:   PO intake, Supplement acceptance, Labs, Weight trends, I & O's  REASON FOR ASSESSMENT:   Consult Assessment of nutrition requirement/status  ASSESSMENT:   69 y.o. female patient presents with 2-day history of abdominal pain.A CT scan was obtained which shows a perforated appendix with a periappendiceal abscess containing appendicoliths.  2/3: s/p CT-guided drainage of right lower abdominal/pelvic abscess  **[place]. States she doesn't really like her choices on the full liquid diet. Pt states she tolerates dairy so is interested in trying some ice cream with her meal trays. Pt states the only thing she likes that she has been ordered so far is the mango icees. Placed dinner order for pt with icees, Magic cups and cream soup. Pt states she hasn't eaten that much for a few days now. Pt is interested in trying Glucerna shakes for extra protein. RD to order.  Per pt, UBW was 190-192 lbs recently. Pt has lost 7 lbs.  Per weight records, pt has lost 7 lbs since July 2020.  I/Os: +3.2L since admit UOP: 700 ml x 24 hrs  Labs reviewed. Medications:  D5 infusion  NUTRITION - FOCUSED PHYSICAL EXAM:  Working remotely.  Diet Order:   Diet Order            Diet full liquid Room service appropriate? Yes; Fluid consistency: Thin  Diet effective now              EDUCATION NEEDS:   No education needs have been identified at this time  Skin:  Skin Assessment: Reviewed  RN Assessment  Last BM:  2/2  Height:   Ht Readings from Last 1 Encounters:  04/01/19 5' 2.5" (1.588 m)    Weight:   Wt Readings from Last 1 Encounters:  04/01/19 83.4 kg   BMI:  Body mass index is 33.1 kg/m.  Estimated Nutritional Needs:   Kcal:  1500-1700  Protein:  60-70g  Fluid:  1.7L/day  Mendel Ryder, MS, RD, LDN Inpatient Clinical Dietitian Contact information available via Amion

## 2019-04-04 LAB — BASIC METABOLIC PANEL
Anion gap: 9 (ref 5–15)
Anion gap: 11 (ref 5–15)
BUN: <5 mg/dL — ABNORMAL LOW (ref 8–23)
BUN: <5 mg/dL — ABNORMAL LOW (ref 8–23)
CO2: 25 mmol/L (ref 22–32)
CO2: 25 mmol/L (ref 22–32)
Calcium: 8.2 mg/dL — ABNORMAL LOW (ref 8.9–10.3)
Calcium: 8.4 mg/dL — ABNORMAL LOW (ref 8.9–10.3)
Chloride: 106 mmol/L (ref 98–111)
Chloride: 107 mmol/L (ref 98–111)
Creatinine, Ser: 0.73 mg/dL (ref 0.44–1.00)
Creatinine, Ser: 0.76 mg/dL (ref 0.44–1.00)
GFR calc Af Amer: >60 mL/min (ref >60)
GFR calc Af Amer: >60 mL/min (ref >60)
GFR calc non Af Amer: >60 mL/min (ref >60)
GFR calc non Af Amer: >60 mL/min (ref >60)
Glucose, Bld: 179 mg/dL — ABNORMAL HIGH (ref 70–99)
Glucose, Bld: 167 mg/dL — ABNORMAL HIGH (ref 70–99)
Potassium: 2.7 mmol/L — CL (ref 3.5–5.1)
Potassium: 3.4 mmol/L — ABNORMAL LOW (ref 3.5–5.1)
Sodium: 140 mmol/L (ref 135–145)
Sodium: 143 mmol/L (ref 135–145)

## 2019-04-04 LAB — CBC
HCT: 34.6 % — ABNORMAL LOW (ref 36.0–46.0)
Hemoglobin: 10.7 g/dL — ABNORMAL LOW (ref 12.0–15.0)
MCH: 27 pg (ref 26.0–34.0)
MCHC: 30.9 g/dL (ref 30.0–36.0)
MCV: 87.2 fL (ref 80.0–100.0)
Platelets: 225 10*3/uL (ref 150–400)
RBC: 3.97 MIL/uL (ref 3.87–5.11)
RDW: 13.7 % (ref 11.5–15.5)
WBC: 10.9 10*3/uL — ABNORMAL HIGH (ref 4.0–10.5)
nRBC: 0 % (ref 0.0–0.2)

## 2019-04-04 LAB — MAGNESIUM: Magnesium: 1.9 mg/dL (ref 1.7–2.4)

## 2019-04-04 MED ORDER — OXYCODONE HCL 5 MG PO TABS: 5.0000 mg | ORAL_TABLET | ORAL | Status: DC | PRN

## 2019-04-04 MED ORDER — METOPROLOL TARTRATE 5 MG/5ML IV SOLN: 5.0000 mg | Freq: Three times a day (TID) | INTRAVENOUS | Status: DC | PRN

## 2019-04-04 MED ORDER — ACETAMINOPHEN 325 MG PO TABS
650.0000 mg | ORAL_TABLET | Freq: Four times a day (QID) | ORAL | Status: DC
  Administered 2019-04-04 – 2019-04-06 (×8): 650 mg via ORAL
  Filled 2019-04-04 (×8): qty 2

## 2019-04-04 MED ORDER — POTASSIUM CHLORIDE 10 MEQ/100ML IV SOLN
10.0000 meq | INTRAVENOUS | Status: AC
  Administered 2019-04-04 (×3): 10 meq via INTRAVENOUS
  Filled 2019-04-04: qty 100

## 2019-04-04 MED ORDER — POTASSIUM CHLORIDE CRYS ER 20 MEQ PO TBCR
40.0000 meq | EXTENDED_RELEASE_TABLET | Freq: Once | ORAL | Status: AC
  Administered 2019-04-04: 40 meq via ORAL
  Filled 2019-04-04: qty 2

## 2019-04-04 MED ORDER — DEXTROSE-NACL 5-0.9 % IV SOLN: INTRAVENOUS | Status: DC

## 2019-04-04 MED ORDER — ALBUTEROL SULFATE (2.5 MG/3ML) 0.083% IN NEBU
2.5000 mg | INHALATION_SOLUTION | RESPIRATORY_TRACT | Status: DC | PRN
  Administered 2019-04-04: 2.5 mg via RESPIRATORY_TRACT

## 2019-04-04 MED ORDER — POTASSIUM CHLORIDE CRYS ER 20 MEQ PO TBCR
30.0000 meq | EXTENDED_RELEASE_TABLET | Freq: Two times a day (BID) | ORAL | Status: AC
  Administered 2019-04-04 (×2): 30 meq via ORAL
  Filled 2019-04-04 (×2): qty 1

## 2019-04-04 NOTE — Progress Notes (Signed)
Pt c/o wheezing. No auditory wheezing from assessing her. Pt is not SOB or showing signs of distress. MD paged and prn breathing tx ordered.

## 2019-04-04 NOTE — Progress Notes (Addendum)
Subjective: CC: Abdominal pain, rlq  Patient doing well this morning. Did have a fever of 100.6 overnight. She reports that she has mild, 3/10 pain in her RLQ that is constant and worse when she gets out of bed. She is tolerating fld but does not like the selections. She reports she is drinking water and had some grits and OJ this morning. She denies any n/v. She is passing flatus. She did have a very small liquidy bm this morning. She is mobilizing in her room without difficulty. She is voiding without difficulty.   Objective: Vital signs in last 24 hours: Temp:  [97.8 F (36.6 C)-100.6 F (38.1 C)] 97.8 F (36.6 C) (02/05 0816) Pulse Rate:  [82-93] 91 (02/05 0816) Resp:  [18-20] 20 (02/05 0816) BP: (142-146)/(62-68) 146/67 (02/05 0816) SpO2:  [90 %-96 %] 96 % (02/05 0816) Last BM Date: 04/04/19  Intake/Output from previous day: 02/04 0701 - 02/05 0700 In: 2562.8 [P.O.:120; I.V.:2311.6; IV Piggyback:131.2] Out: 1120 [Urine:1100; Drains:20] Intake/Output this shift: Total I/O In: 100 [P.O.:90; Other:10] Out: 400 [Urine:400]  PE: Gen:  Alert, NAD, pleasant Card:  RRR Pulm:  CTAB, no W/R/R, effort normal Abd: Soft, protuberant, appears ND, mild tenderness of the rlq without r/r/g. +BS. IR drain in place with SS fluid in drain. 20cc/24 hours.  Ext:  Trace pedal edema b/l Psych: A&Ox3  Skin: no rashes noted, warm and dry  Lab Results:  Recent Labs    04/02/19 0425 04/04/19 0450  WBC 14.1* 10.9*  HGB 11.4* 10.7*  HCT 36.6 34.6*  PLT 213 225   BMET Recent Labs    04/02/19 0425 04/04/19 0450  NA 141 140  K 3.5 2.7*  CL 104 106  CO2 26 25  GLUCOSE 148* 179*  BUN 10 <5*  CREATININE 0.88 0.73  CALCIUM 8.7* 8.2*   PT/INR Recent Labs    04/02/19 0834  LABPROT 15.6*  INR 1.2   CMP     Component Value Date/Time   NA 140 04/04/2019 0450   K 2.7 (LL) 04/04/2019 0450   CL 106 04/04/2019 0450   CO2 25 04/04/2019 0450   GLUCOSE 179 (H) 04/04/2019 0450   BUN <5 (L) 04/04/2019 0450   CREATININE 0.73 04/04/2019 0450   CREATININE 0.72 02/08/2018 1400   CALCIUM 8.2 (L) 04/04/2019 0450   PROT 7.4 04/02/2019 0425   ALBUMIN 3.3 (L) 04/02/2019 0425   AST 15 04/02/2019 0425   ALT 15 04/02/2019 0425   ALKPHOS 77 04/02/2019 0425   BILITOT 0.6 04/02/2019 0425   GFRNONAA >60 04/04/2019 0450   GFRNONAA 78 11/13/2011 0827   GFRAA >60 04/04/2019 0450   GFRAA >89 11/13/2011 0827   Lipase     Component Value Date/Time   LIPASE 22 04/01/2019 1904       Studies/Results: CT IMAGE GUIDED DRAINAGE BY PERCUTANEOUS CATHETER  Result Date: 04/02/2019 INDICATION: Right lower quadrant ruptured appendiceal abscess EXAM: CT GUIDED DRAINAGE OF RIGHT LOWER QUADRANT ABSCESS MEDICATIONS: The patient is currently admitted to the hospital and receiving intravenous antibiotics. The antibiotics were administered within an appropriate time frame prior to the initiation of the procedure. ANESTHESIA/SEDATION: 2.0 mg IV Versed 50 mcg IV Fentanyl Moderate Sedation Time:  15 MINUTES The patient was continuously monitored during the procedure by the interventional radiology nurse under my direct supervision. COMPLICATIONS: None immediate. TECHNIQUE: Informed written consent was obtained from the patient after a thorough discussion of the procedural risks, benefits and alternatives. All questions were  addressed. Maximal Sterile Barrier Technique was utilized including caps, mask, sterile gowns, sterile gloves, sterile drape, hand hygiene and skin antiseptic. A timeout was performed prior to the initiation of the procedure. PROCEDURE: Previous imaging reviewed. Patient positioned supine. Noncontrast localization CT performed. The right lower quadrant appendiceal abscess was localized and marked for a right lower quadrant lateral approach. Under sterile conditions and local anesthesia, an 18 gauge 15 cm access needle was advanced from a right lower quadrant lateral approach to the  abscess. CT confirms needle position. Syringe aspiration yielded purulent fluid. Guidewire advanced. Guidewire confirmed within the abscess cavity by CT. Tract dilatation performed to insert a 10 [place] drain. Drain catheter position also confirmed with CT. Syringe aspiration yielded a total of 12 cc purulent fecal contaminated fluid. Sample sent for culture. Catheter secured with Prolene suture and connected to external suction bulb. No immediate complication. Patient tolerated the procedure well. FINDINGS: Imaging confirms needle placed in the right lower quadrant appendiceal abscess for drain placement IMPRESSION: Successful CT-guided right lower quadrant appendiceal abscess drain insertion Electronically Signed   By: Jerilynn Mages.  Shick M.D.   On: 04/02/2019 15:12    Anti-infectives: Anti-infectives (From admission, onward)   Start     Dose/Rate Route Frequency Ordered Stop   04/02/19 0400  piperacillin-tazobactam (ZOSYN) IVPB 3.375 g     3.375 g 12.5 mL/hr over 240 Minutes Intravenous Every 8 hours 04/02/19 0353     04/01/19 2045  piperacillin-tazobactam (ZOSYN) IVPB 3.375 g     3.375 g 100 mL/hr over 30 Minutes Intravenous  Once 04/01/19 2043 04/01/19 2120       Assessment/Plan HTN - home meds Depression Pre-diabetes - Glucose < 200 on BMP's. F/u PCP  Hypokalemia - K 2.7 this am. 4 runs of IV K completed. Will give oral potassium as well. Check Mag now. Recheck BMP at 1300.  Perforated appendicitis with abscess/phlegmon containing appendicoliths - IR RLQ drain 04/30/19. Drain care per IR  - Cx w/ abd gram + cocci and gram - rods. Re-incubated for better growth - Continue non-op management with IV abx and IR drain  - Will need 10-14 days of abx total. Cont IV abx. Await sensitives of cx.  - Mobilize   [id number], SLIV VTE: SCD's, lovenox [id number] 02/02 >> day 4. WBC  18.2>>14.1> 10.9. Tmax 100.6 overnight  Foley:None Follow [id number] and Drain clinic   Plan: Adv diet, SLIV, start on  oral pain medication, continue iv abx. Follow IR cx's. Replace K. Check Mg now and BMP in PM. Possible d/c home tomorrow w/ abx, f/u with Dr. Rosendo Gros in the office and drain clinic for IR drain.   LOS: 2 days    Jillyn Ledger , [facility] 04/04/2019, 10:01 AM Please see Amion for pager number during day hours 7:00am-4:30pm

## 2019-04-04 NOTE — Progress Notes (Signed)
MD paged at [place] for critical K of 2.7.

## 2019-04-04 NOTE — Discharge Instructions (Signed)
Appendicitis, Adult ° °Appendicitis is inflammation of the appendix. The appendix is a finger-shaped tube that is attached to the large intestine. If appendicitis is not treated, it can cause the appendix to tear (rupture). A ruptured appendix can lead to a life-threatening infection. It can also cause a painful collection of pus (abscess) to form in the appendix. °What are the causes? °This condition may be caused by a blockage in the appendix that leads to infection. The blockage can be caused by: °· A ball of stool (feces). °· Enlarged lymph glands. °In some cases, the cause may not be known. °What increases the risk? °Age is a risk factor. You are more likely to develop this condition if you are between 10 and 30 years of age. °What are the signs or symptoms? °Symptoms of this condition include: °· Pain that starts around the belly button and moves toward the lower right part of the abdomen. The pain can become more severe as time passes. It gets worse with coughing or sudden movements. °· Tenderness in the lower right abdomen. °· Nausea. °· Vomiting. °· Loss of appetite. °· Fever. °· Difficulty passing stool (constipation). °· Passing very loose stools (diarrhea). °· Generally feeling unwell. °How is this diagnosed? °This condition may be diagnosed with: °· A physical exam. °· Blood tests. °· Urine test. °To confirm the diagnosis, an ultrasound, MRI, or CT scan may be done. °How is this treated? °This condition is usually treated with surgery to remove the appendix (appendectomy). There are two methods for doing an appendectomy: °· Open appendectomy. In this surgery, the appendix is removed through a large incision that is made in the lower right abdomen. This procedure may be recommended if: °? You have major scarring from a previous surgery. °? You have a bleeding disorder. °? You are pregnant and are about to give birth. °? You have a condition that makes it hard to do surgery through small incisions  (laparoscopic procedure). This includes severe infection or a ruptured appendix. °· Laparoscopic appendectomy. In this surgery, the appendix is removed through small incisions. This procedure usually causes less pain and fewer problems than an open appendectomy. It also has a shorter recovery time. °If the appendix has ruptured and an abscess has formed: °· A drain may be placed into the abscess to remove fluid. °· Antibiotic medicines may be given through an IV. °· The appendix may or may not need to be removed. °Follow these instructions at home: °If you had surgery, follow instructions from your health care provider about how to care for yourself at home and how to care for your incision. °Medicines °· Take over-the-counter and prescription medicines only as told by your health care provider. °· If you were prescribed an antibiotic medicine, take it as told by your health care provider. Do not stop taking the antibiotic even if you start to feel better. °Eating and drinking °· Follow instructions from your health care provider about eating restrictions. You may slowly resume a regular diet once your nausea or vomiting stops. °General instructions °· Do not use any products that contain nicotine or tobacco, such as cigarettes, e-cigarettes, and chewing tobacco. If you need help quitting, ask your health care provider. °· Do not drive or use heavy machinery while taking prescription pain medicine. °· Ask your health care provider if the medicine prescribed to you can cause constipation. You may need to take steps to prevent or treat constipation, such as: °? Drink enough fluid to keep your   urine pale yellow. ? Take over-the-counter or prescription medicines. ? Eat foods that are high in fiber, such as beans, whole grains, and fresh fruits and vegetables. ? Limit foods that are high in fat and processed sugars, such as fried or sweet foods.  Keep all follow-up visits as told by your health care provider. This  is important. Contact a health care provider if:  There is pus, blood, or excessive drainage coming from your incision.  You have nausea or vomiting. Get help right away if you have:  Worsening abdominal pain.  A fever.  Chills.  Fatigue.  Muscle aches.  Shortness of breath. Summary  Appendicitis is inflammation of the appendix.  This condition may be caused by a blockage in the appendix that leads to infection.  This condition is usually treated with surgery to remove the appendix. This information is not intended to replace advice given to you by your health care provider. Make sure you discuss any questions you have with your health care provider. Document Revised: 08/01/2017 Document Reviewed: 08/01/2017 Elsevier Patient Education  2020 [place] Surgical drains are used to remove extra fluid that normally builds up in a surgical wound after surgery. A surgical drain helps to heal a surgical wound. Different kinds of surgical drains include:  Active drains. These drains use suction to pull drainage away from the surgical wound. Drainage flows through a tube to a container outside of the body. With these drains, you need to keep the bulb or the drainage container flat (compressed) at all times, except while you empty it. Flattening the bulb or container creates suction.  Passive drains. These drains allow fluid to drain naturally, by gravity. Drainage flows through a tube to a bandage (dressing) or a container outside of the body. Passive drains do not need to be emptied. A drain is placed during surgery. Right after surgery, drainage is usually bright red and a little thicker than water. The drainage may gradually turn yellow or pink and become thinner. It is likely that your health care provider will remove the drain when the drainage stops or when the amount decreases to 1-2 Tbsp (15-30 mL) during a 24-hour period. Supplies  needed:  Tape.  Germ-free cleaning solution (sterile saline).  Cotton swabs.  Split gauze drain sponge: 4 x 4 inches (10 x 10 cm).  Gauze square: 4 x 4 inches (10 x 10 cm). How to care for your surgical drain Care for your drain as told by your health care provider. This is important to help prevent infection. If your drain is placed at your back, or any other hard-to-reach area, ask another person to assist you in performing the following tasks: General care  Keep the skin around the drain dry and covered with a dressing at all times.  Check your drain area every day for signs of infection. Check for: ? Redness, swelling, or pain. ? Pus or a bad smell. ? Cloudy drainage. ? Tenderness or pressure at the drain exit site. Changing the dressing Follow instructions from your health care provider about how to change your dressing. Change your dressing at least once a day. Change it more often if needed to keep the dressing dry. Make sure you: 1. Gather your supplies. 2. Wash your hands with soap and water before you change your dressing. If soap and water are not available, use hand sanitizer. 3. Remove the old dressing. Avoid using scissors to do that. 4. Wash your hands  with soap and water again after removing the old dressing. 5. Use sterile saline to clean your skin around the drain. You may need to use a cotton swab to clean the skin. 6. Place the tube through the slit in a drain sponge. Place the drain sponge so that it covers your wound. 7. Place the gauze square or another drain sponge on top of the drain sponge that is on the wound. Make sure the tube is between those layers. 8. Tape the dressing to your skin. 9. Tape the drainage tube to your skin 1-2 inches (2.5-5 cm) below the place where the tube enters your body. Taping keeps the tube from pulling on any stitches (sutures) that you have. 10. Wash your hands with soap and water. 11. Write down the color of your drainage and  how often you change your dressing. How to empty your active drain  1. Make sure that you have a measuring cup that you can empty your drainage into. 2. Wash your hands with soap and water. If soap and water are not available, use hand sanitizer. 3. Loosen any pins or clips that hold the tube in place. 4. If your health care provider tells you to strip the tube to prevent clots and tube blockages: ? Hold the tube at the skin with one hand. Use your other hand to pinch the tubing with your thumb and first finger. ? Gently move your fingers down the tube while squeezing very lightly. This clears any drainage, clots, or tissue from the tube. ? You may need to do this several times each day to keep the tube clear. Do not pull on the tube. 5. Open the bulb cap or the drain plug. Do not touch the inside of the cap or the bottom of the plug. 6. Turn the device upside down and gently squeeze. 7. Empty all of the drainage into the measuring cup. 8. Compress the bulb or the container and replace the cap or the plug. To compress the bulb or the container, squeeze it firmly in the middle while you close the cap or plug the container. 9. Write down the amount of drainage that you have in each 24-hour period. If you have less than 2 Tbsp (30 mL) of drainage during 24 hours, contact your health care provider. 10. Flush the drainage down the toilet. 11. Wash your hands with soap and water. Contact a health care provider if:  You have redness, swelling, or pain around your drain area.  You have pus or a bad smell coming from your drain area.  You have a fever or chills.  The skin around your drain is warm to the touch.  The amount of drainage that you have is increasing instead of decreasing.  You have drainage that is cloudy.  There is a sudden stop or a sudden decrease in the amount of drainage that you have.  Your drain tube falls out.  Your active drain does not stay compressed after you empty  it. Summary  Surgical drains are used to remove extra fluid that normally builds up in a surgical wound after surgery.  Different kinds of surgical drains include active drains and passive drains. Active drains use suction to pull drainage away from the surgical wound, and passive drains allow fluid to drain naturally.  It is important to care for your drain to prevent infection. If your drain is placed at your back, or any other hard-to-reach area, ask another person to assist you.  Contact your health care provider if you have redness, swelling, or pain around your drain area. This information is not intended to replace advice given to you by your health care provider. Make sure you discuss any questions you have with your health care provider. Document Revised: 03/20/2018 Document Reviewed: 03/20/2018 Elsevier Patient Education  [place].  Percutaneous Abscess Drain, Care After This sheet gives you information about how to care for yourself after your procedure. Your health care provider may also give you more specific instructions. If you have problems or questions, contact your health care provider. What can I expect after the procedure? After your procedure, it is common to have:  A small amount of bruising and discomfort in the area where the drainage tube (catheter) was placed.  Sleepiness and fatigue. This should go away after the medicines you were given have worn off. Follow these instructions at home: Incision care  Follow instructions from your health care provider about how to take care of your incision. Make sure you: ? Wash your hands with soap and water before you change your bandage (dressing). If soap and water are not available, use hand sanitizer. ? Change your dressing as told by your health care provider. ? Leave stitches (sutures), skin glue, or adhesive strips in place. These skin closures may need to stay in place for 2 weeks or longer. If adhesive  strip edges start to loosen and curl up, you may trim the loose edges. Do not remove adhesive strips completely unless your health care provider tells you to do that.  Check your incision area every day for signs of infection. Check for: ? More redness, swelling, or pain. ? More fluid or blood. ? Warmth. ? Pus or a bad smell. ? Fluid leaking from around your catheter (instead of fluid draining through your catheter). Catheter care   Follow instructions from your health care provider about emptying and cleaning your catheter and collection bag. You may need to clean the catheter every day so it does not clog.  If directed, write down the following information every time you empty your bag: ? The date and time. ? The amount of drainage. General instructions  Rest at home for 1-2 days after your procedure. Return to your normal activities as told by your health care provider.  Do not take baths, swim, or use a hot tub for 24 hours after your procedure, or until your health care provider says that this is okay.  Take over-the-counter and prescription medicines only as told by your health care provider.  Keep all follow-up visits as told by your health care provider. This is important. Contact a health care provider if:  You have less than 10 mL of drainage a day for 2-3 days in a row, or as directed by your health care provider.  You have more redness, swelling, or pain around your incision area.  You have more fluid or blood coming from your incision area.  Your incision area feels warm to the touch.  You have pus or a bad smell coming from your incision area.  You have fluid leaking from around your catheter (instead of through your catheter).  You have a fever or chills.  You have pain that does not get better with medicine. Get help right away if:  Your catheter comes out.  You suddenly stop having drainage from your catheter.  You suddenly have blood in the fluid that  is draining from your catheter.  You become  dizzy or you faint.  You develop a rash.  You have nausea or vomiting.  You have difficulty breathing or you feel short of breath.  You develop chest pain.  You have problems with your speech or vision.  You have trouble balancing or moving your arms or legs. Summary  It is common to have a small amount of bruising and discomfort in the area where the drainage tube (catheter) was placed.  You may be directed to record the amount of drainage from the bag every time you empty it.  Follow instructions from your health care provider about emptying and cleaning your catheter and collection bag. This information is not intended to replace advice given to you by your health care provider. Make sure you discuss any questions you have with your health care provider. Document Revised: 01/26/2017 Document Reviewed: 01/06/2016 Elsevier Patient Education  2020 [employer].

## 2019-04-04 NOTE — Progress Notes (Signed)
Referring Physician(s): Armandina Gemma  Supervising Physician: Daryll Brod  Patient Status:  [facility] - In-pt  Chief Complaint: RLQ abdominal pain, Appendiceal abscess s/p drain placement (04/02/2019)  Subjective: Patient appears alert and is sitting comfortably in bed today. She reports she is feeling better today as compared to previous days. Patient states she has mild tenderness in her RLQ and at the drain site. She denies drain leakage and reports drain is getting flushed regularly. Patient denies any fever, nausea, vomiting, chills, chest pain or SOB. She states she had two bowel movements this morning, and denies bloody or black stools. Patient has an appetite and has been eating. Today her vitals are stable and her WBC is improving (10.9). The surgery team is hopeful to discharge her sometime this weekend.   Allergies: Aldactone [spironolactone]  Medications: Prior to Admission medications   Medication Sig Start Date End Date Taking? Authorizing Provider  acetaminophen (TYLENOL) 650 MG CR tablet Take 650 mg by mouth every 8 (eight) hours as needed for pain.   Yes [provider]  amLODipine (NORVASC) 5 MG tablet TAKE 1 TABLET BY MOUTH  DAILY Patient taking differently: Take 5 mg by mouth daily.  02/23/19  Yes [name], NP  aspirin 81 MG chewable tablet Chew 81 mg by mouth daily.     Yes [provider]  Calcium Carbonate-Vitamin D (CALTRATE 600+D) 600-400 MG-UNIT per tablet Take 1 tablet by mouth 2 (two) times daily.   Yes [provider]  Ferrous Gluconate (IRON) 240 (27 FE) MG TABS Take 1 tablet by mouth daily.     Yes [provider]  lisinopril (ZESTRIL) 40 MG tablet Take 1 tablet (40 mg total) by mouth daily. 12/24/18  Yes [name], NP  meloxicam (MOBIC) 7.5 MG tablet Take 1 tablet by mouth once daily Patient taking differently: Take 7.5 mg by mouth daily.  12/24/18  Yes [name], NP  metoprolol succinate  (TOPROL-XL) 25 MG 24 hr tablet Take 1 tablet (25 mg total) by mouth daily. 12/24/18  Yes [name], NP  blood glucose meter kit and supplies KIT Dispense based on patient and insurance preference. Use up to four times daily as directed. (FOR ICD-9 250.00, 250.01). 02/10/19   [name], NP     Vital Signs: BP (!) 132/52 (BP Location: Left Arm) Comment: Notified Leann, RN of abnormal BP  Pulse 82   Temp 98.4 F (36.9 C) (Oral)   Resp 19   Ht 5' 2.5" (1.588 m)   Wt 183 lb 14.4 oz (83.4 kg)   SpO2 99%   BMI 33.10 kg/m   Physical Exam Cardiovascular:     Rate and Rhythm: Normal rate and regular rhythm.     Heart sounds: Normal heart sounds.  Pulmonary:     Effort: Pulmonary effort is normal.     Breath sounds: Normal breath sounds.  Abdominal:     General: Abdomen is flat. Bowel sounds are normal.     Palpations: Abdomen is soft.     Tenderness: There is abdominal tenderness (tenderness over drain site) in the right lower quadrant.     Comments: Drain site is clean, dry and intact. Approximately 10 cc of serosanguinous drainage.   Skin:    General: Skin is warm and dry.  Neurological:     Mental Status: She is alert.     Imaging: CT Abdomen Pelvis W Contrast  Result Date: 04/01/2019 CLINICAL DATA:  69 year old female with right-sided abdominal pain and diarrhea.  EXAM: CT ABDOMEN AND PELVIS WITH CONTRAST TECHNIQUE: Multidetector CT imaging of the abdomen and pelvis was performed using the standard protocol following bolus administration of intravenous contrast. CONTRAST:  161m OMNIPAQUE IOHEXOL 300 MG/ML  SOLN COMPARISON:  Pelvic ultrasound dated 10/01/2018. FINDINGS: Lower chest: Minimal right lung base atelectasis. The visualized lung bases are otherwise clear. No intra-abdominal free air.  Small free fluid within the pelvis. Hepatobiliary: Multiple hepatic hypodense lesions measuring up to 2.2 cm in the left lobe of the liver. The larger lesions demonstrate  fluid attenuation most consistent with cysts and the smaller lesions are not characterized. There is background of fatty infiltration of the liver. No intrahepatic biliary ductal dilatation. The gallbladder is unremarkable. Pancreas: Unremarkable. No pancreatic ductal dilatation or surrounding inflammatory changes. Spleen: Normal in size without focal abnormality. Adrenals/Urinary Tract: The adrenal glands are unremarkable. There is a 2 mm nonobstructing right renal interpolar calculus. There is no hydronephrosis on either side. There is symmetric enhancement and excretion of contrast by both kidneys. The visualized ureters and urinary bladder appear unremarkable. Stomach/Bowel: There is no bowel obstruction. The appendix is enlarged and inflamed. A 4.5 x 3.0 cm fluid containing collection in the right lower quadrant at the tip of the appendix is concerning for loculated collection or contained perforation versus abscess. Several appendicoliths noted within this collection. The appendix is located in the right lower quadrant posterior to the cecum and measures approximately 13 mm in thickness. Vascular/Lymphatic: There is mild aortoiliac atherosclerotic disease. The IVC is unremarkable. No portal venous gas. There is no adenopathy. Reproductive: There is a 2 cm posterior uterine fibroid. Bilateral ovarian follicles/cysts measure up to 3 cm on the right. Other: None Musculoskeletal: No acute or significant osseous findings. IMPRESSION: 1. Acute appendicitis with findings most consistent with contained/loculated perforation or developing abscess containing several appendicoliths at the tip of the appendix. 2.  Aortic Atherosclerosis (ICD10-I70.0). Electronically Signed   By: AAnner CreteM.D.   On: 04/01/2019 20:34   DG Chest Port 1 View  Result Date: 04/01/2019 CLINICAL DATA:  Abdominal pain and diarrhea EXAM: PORTABLE CHEST 1 VIEW COMPARISON:  11/25/2010 FINDINGS: Mild right lower lobe atelectasis/infiltrate.  Left lung clear. No heart failure, edema, or effusion. IMPRESSION: Mild right lower lobe atelectasis/infiltrate. Electronically Signed   By: CFranchot GalloM.D.   On: 04/01/2019 19:20   CT IMAGE GUIDED DRAINAGE BY PERCUTANEOUS CATHETER  Result Date: 04/02/2019 INDICATION: Right lower quadrant ruptured appendiceal abscess EXAM: CT GUIDED DRAINAGE OF RIGHT LOWER QUADRANT ABSCESS MEDICATIONS: The patient is currently admitted to the hospital and receiving intravenous antibiotics. The antibiotics were administered within an appropriate time frame prior to the initiation of the procedure. ANESTHESIA/SEDATION: 2.0 mg IV Versed 50 mcg IV Fentanyl Moderate Sedation Time:  15 MINUTES The patient was continuously monitored during the procedure by the interventional radiology nurse under my direct supervision. COMPLICATIONS: None immediate. TECHNIQUE: Informed written consent was obtained from the patient after a thorough discussion of the procedural risks, benefits and alternatives. All questions were addressed. Maximal Sterile Barrier Technique was utilized including caps, mask, sterile gowns, sterile gloves, sterile drape, hand hygiene and skin antiseptic. A timeout was performed prior to the initiation of the procedure. PROCEDURE: Previous imaging reviewed. Patient positioned supine. Noncontrast localization CT performed. The right lower quadrant appendiceal abscess was localized and marked for a right lower quadrant lateral approach. Under sterile conditions and local anesthesia, an 18 gauge 15 cm access needle was advanced from a right lower quadrant lateral approach to  the abscess. CT confirms needle position. Syringe aspiration yielded purulent fluid. Guidewire advanced. Guidewire confirmed within the abscess cavity by CT. Tract dilatation performed to insert a 10 [place] drain. Drain catheter position also confirmed with CT. Syringe aspiration yielded a total of 12 cc purulent fecal contaminated fluid. Sample sent  for culture. Catheter secured with Prolene suture and connected to external suction bulb. No immediate complication. Patient tolerated the procedure well. FINDINGS: Imaging confirms needle placed in the right lower quadrant appendiceal abscess for drain placement IMPRESSION: Successful CT-guided right lower quadrant appendiceal abscess drain insertion Electronically Signed   By: Jerilynn Mages.  Shick M.D.   On: 04/02/2019 15:12    Labs:  CBC: Recent Labs    04/01/19 1904 04/02/19 0425 04/04/19 0450  WBC 18.2* 14.1* 10.9*  HGB 12.8 11.4* 10.7*  HCT 40.4 36.6 34.6*  PLT 236 213 225    COAGS: Recent Labs    04/02/19 0834  INR 1.2    BMP: Recent Labs    04/01/19 1904 04/02/19 0425 04/04/19 0450 04/04/19 1243  NA 136 141 140 143  K 3.5 3.5 2.7* 3.4*  CL 102 104 106 107  CO2 '23 26 25 25  ' GLUCOSE 157* 148* 179* 167*  BUN 11 10 <5* <5*  CALCIUM 9.0 8.7* 8.2* 8.4*  CREATININE 0.97 0.88 0.73 0.76  GFRNONAA >60 >60 >60 >60  GFRAA >60 >60 >60 >60    LIVER FUNCTION TESTS: Recent Labs    04/01/19 1904 04/02/19 0425  BILITOT 0.4 0.6  AST 18 15  ALT 20 15  ALKPHOS 81 77  PROT 8.3* 7.4  ALBUMIN 3.7 3.3*    Assessment and Plan:  Appendiceal Abscess s/p Drain placement on 04/02/2019. cx- gm neg rods/proteus; WBC 10.9(14.1), K 2.7- replace; creat nl  Patient's condition and symptoms are improving. Continue to flush drain and monitor output. Patient will most likely be discharged this weekend. During today's visit, drain care/flushing was demonstrated and discussed with her. Patient was told she will follow up at IR drain clinic  in approximately 1-2 weeks after discharge to assess the drain .  Prescription for saline flushes given to pt.   Electronically Signed: D. Rowe Robert, PA-C / Donnamarie Rossetti, PA-S 04/04/2019, 3:03 PM   I spent a total of 15 Minutes at the the patient's bedside AND on the patient's hospital floor or unit, greater than 50% of which was counseling/coordinating  care for appendiceal abscess drain     Patient ID: Cassidy Harrell, female   DOB: 1950/05/01, 69 y.o.   MRN: [id number]

## 2019-04-05 ENCOUNTER — Inpatient Hospital Stay (HOSPITAL_COMMUNITY): Payer: Medicare Other

## 2019-04-05 LAB — BASIC METABOLIC PANEL
Anion gap: 9 (ref 5–15)
BUN: <5 mg/dL — ABNORMAL LOW (ref 8–23)
CO2: 25 mmol/L (ref 22–32)
Calcium: 8.9 mg/dL (ref 8.9–10.3)
Chloride: 107 mmol/L (ref 98–111)
Creatinine, Ser: 0.67 mg/dL (ref 0.44–1.00)
GFR calc Af Amer: >60 mL/min (ref >60)
GFR calc non Af Amer: >60 mL/min (ref >60)
Glucose, Bld: 138 mg/dL — ABNORMAL HIGH (ref 70–99)
Potassium: 3.8 mmol/L (ref 3.5–5.1)
Sodium: 141 mmol/L (ref 135–145)

## 2019-04-05 LAB — CBC
HCT: 36 % (ref 36.0–46.0)
Hemoglobin: 11.3 g/dL — ABNORMAL LOW (ref 12.0–15.0)
MCH: 27 pg (ref 26.0–34.0)
MCHC: 31.4 g/dL (ref 30.0–36.0)
MCV: 86.1 fL (ref 80.0–100.0)
Platelets: 243 10*3/uL (ref 150–400)
RBC: 4.18 MIL/uL (ref 3.87–5.11)
RDW: 13.9 % (ref 11.5–15.5)
WBC: 8.5 10*3/uL (ref 4.0–10.5)
nRBC: 0 % (ref 0.0–0.2)

## 2019-04-05 NOTE — Progress Notes (Signed)
   Subjective/Chief Complaint:nausea Nauseated bloated minimal pain   Objective: Vital signs in last 24 hours: Temp:  [98 F (36.7 C)-98.4 F (36.9 C)] 98 F (36.7 C) (02/06 0635) Pulse Rate:  [78-84] 78 (02/06 0635) Resp:  [18-19] 18 (02/06 0635) BP: (132-146)/(52-69) 146/69 (02/06 0635) SpO2:  [94 %-99 %] 95 % (02/06 0635) Last BM Date: 04/04/19  Intake/Output from previous day: 02/05 0701 - 02/06 0700 In: 5619.8 [P.O.:450; I.V.:5089.9; IV Piggyback:69.9] Out: 2741 [Urine:2700; Drains:40; Stool:1] Intake/Output this shift: Total I/O In: 5 [Other:5] Out: 10 [Drains:10]   Gen:  Alert, NAD, pleasant HEENT no JVD trachea midline  Card:  RRR no heave Pulm:  CTAB, no W/R/R, effort normal Abd: Soft, protuberant, appears ND, mild tenderness of the rlq without r/r/g. +BS. IR drain in place with SS fluid   Ext:  Trace pedal edema b/l ROM normal  Psych: A&Ox3  Skin: no rashes noted, warm and dry Neuro: motor function intact  No numbness Neuro psych: mood affect normal  Lab Results:  Recent Labs    04/04/19 0450 04/05/19 0803  WBC 10.9* 8.5  HGB 10.7* 11.3*  HCT 34.6* 36.0  PLT 225 243   BMET Recent Labs    04/04/19 1243 04/05/19 0803  NA 143 141  K 3.4* 3.8  CL 107 107  CO2 25 25  GLUCOSE 167* 138*  BUN <5* <5*  CREATININE 0.76 0.67  CALCIUM 8.4* 8.9   PT/INR No results for input(s): LABPROT, INR in the last 72 hours. ABG No results for input(s): PHART, HCO3 in the last 72 hours.  Invalid input(s): PCO2, PO2  Studies/Results: No results found.  Anti-infectives: Anti-infectives (From admission, onward)   Start     Dose/Rate Route Frequency Ordered Stop   04/02/19 0400  piperacillin-tazobactam (ZOSYN) IVPB 3.375 g     3.375 g 12.5 mL/hr over 240 Minutes Intravenous Every 8 hours 04/02/19 0353     04/01/19 2045  piperacillin-tazobactam (ZOSYN) IVPB 3.375 g     3.375 g 100 mL/hr over 30 Minutes Intravenous  Once 04/01/19 2043 04/01/19 2120       Assessment/Plan:  HTN - home meds Depression Pre-diabetes - Glucose < 200 on BMP's. F/u PCP  Hypokalemia - K 2.7 this am. 4 runs of IV K completed. Will give oral potassium as well. Check Mag now. Recheck BMP at 1300.  Perforated appendicitis with abscess/phlegmon containing appendicoliths - IRRLQ drain 04/30/19. Drain care per IR  - Cx w/ abd gram + cocci and gram - rods. Re-incubated for better growth - Continue non-op management with IV abx and IR drain  - Will need 10-14 days of abx total. Cont IV abx. Await sensitives of cx.  - Mobilize  Check KUB due to bloating    [id number], SLIV VTE: SCD's, lovenox [id number] 02/02>> day 4. WBC18.2>>14.1> 10.9. Tmax 100.6 overnight  Foley:None Follow [id number] and Drain clinic   LOS: 3 days    Cassidy Harrell 04/05/2019

## 2019-04-06 LAB — AEROBIC/ANAEROBIC CULTURE W GRAM STAIN (SURGICAL/DEEP WOUND): Special Requests: NORMAL

## 2019-04-06 MED ORDER — AMOXICILLIN-POT CLAVULANATE 875-125 MG PO TABS: 1.0000 | ORAL_TABLET | Freq: Two times a day (BID) | ORAL | 1 refills | Status: DC

## 2019-04-06 MED ORDER — AMOXICILLIN-POT CLAVULANATE 875-125 MG PO TABS
1.0000 | ORAL_TABLET | Freq: Two times a day (BID) | ORAL | Status: DC
  Administered 2019-04-06: 12:00:00 1 via ORAL
  Filled 2019-04-06: qty 1

## 2019-04-06 MED ORDER — OXYCODONE HCL 5 MG PO TABS: 5.0000 mg | ORAL_TABLET | Freq: Four times a day (QID) | ORAL | 0 refills | Status: DC | PRN

## 2019-04-06 NOTE — Progress Notes (Signed)
   Subjective/Chief Complaint: Still had some nausea this am but no vomiting   Objective: Vital signs in last 24 hours: Temp:  [97.9 F (36.6 C)-98.6 F (37 C)] 98.6 F (37 C) (02/07 0602) Pulse Rate:  [75-80] 75 (02/07 0602) Resp:  [16-18] 16 (02/07 0602) BP: (125-138)/(53-66) 125/53 (02/07 0602) SpO2:  [96 %-98 %] 96 % (02/07 0602) Last BM Date: 04/05/19  Intake/Output from previous day: 02/06 0701 - 02/07 0700 In: 597.1 [P.O.:360; I.V.:62.1; IV Piggyback:160] Out: 370 [Urine:350; Drains:20] Intake/Output this shift: Total I/O In: -  Out: 800 [Urine:800]  General appearance: alert and cooperative Resp: clear to auscultation bilaterally Cardio: regular rate and rhythm GI: soft, mild RLQ tenderness. drain output serosanguinous  Lab Results:  Recent Labs    04/04/19 0450 04/05/19 0803  WBC 10.9* 8.5  HGB 10.7* 11.3*  HCT 34.6* 36.0  PLT 225 243   BMET Recent Labs    04/04/19 1243 04/05/19 0803  NA 143 141  K 3.4* 3.8  CL 107 107  CO2 25 25  GLUCOSE 167* 138*  BUN <5* <5*  CREATININE 0.76 0.67  CALCIUM 8.4* 8.9   PT/INR No results for input(s): LABPROT, INR in the last 72 hours. ABG No results for input(s): PHART, HCO3 in the last 72 hours.  Invalid input(s): PCO2, PO2  Studies/Results: DG Abd 1 View  Result Date: 04/05/2019 CLINICAL DATA:  Right lower quadrant abdominal pain. Status post drainage catheter placement of appendiceal abscess. EXAM: ABDOMEN - 1 VIEW COMPARISON:  None. FINDINGS: The bowel gas pattern is normal. Pigtail drainage catheter is noted in right lower quadrant. No abnormal calcifications are noted. IMPRESSION: Pigtail drainage catheter seen in right lower quadrant. No evidence of bowel obstruction or ileus. Electronically Signed   By: Marijo Conception M.D.   On: 04/05/2019 13:26    Anti-infectives: Anti-infectives (From admission, onward)   Start     Dose/Rate Route Frequency Ordered Stop   04/06/19 1030   amoxicillin-clavulanate (AUGMENTIN) 875-125 MG per tablet 1 tablet     1 tablet Oral Every 12 hours 04/06/19 1023     04/02/19 0400  piperacillin-tazobactam (ZOSYN) IVPB 3.375 g  Status:  Discontinued     3.375 g 12.5 mL/hr over 240 Minutes Intravenous Every 8 hours 04/02/19 0353 04/06/19 1023   04/01/19 2045  piperacillin-tazobactam (ZOSYN) IVPB 3.375 g     3.375 g 100 mL/hr over 30 Minutes Intravenous  Once 04/01/19 2043 04/01/19 2120      Assessment/Plan: s/p * No surgery found * will switch to oral abx   Continue perc drain for perforated appendicitis Possibly home later today if nausea resolves HTN - home meds Depression Pre-diabetes- Glucose <200 on BMP's. F/u PCP  Hypokalemia - K 2.7 this am. 4 runs of IV K completed. Will give oral potassium as well. Check Mag now. Recheck BMP at 1300.  Perforated appendicitis with abscess/phlegmon containing appendicoliths - IRRLQ drain 04/30/19. Drain care per IR -Cx w/ abd gram + cocci and gram - rods. Re-incubated for better growth -Continue non-op management with IV abx and IR drain  - Will need 10-14 days of abx total. Cont IV abx. Await sensitives of cx.  - Mobilize Check KUB due to bloating    [id number], SLIV VTE: SCD's, lovenox [id number] 02/02>> day5.WBCnml Foley:None Follow [id number] Drain clinic  LOS: 4 days    Autumn Messing III 04/06/2019

## 2019-04-06 NOTE — Progress Notes (Signed)
Dressing change and JP care teaching done with patient and her daughter.  Daughter changed the dressing, flushed the JP with 5cc saline, emptied the JP and compressed it for suction.  Feels comfortable. All questions answered.  Discharge teaching done.  Written information given.

## 2019-04-07 ENCOUNTER — Telehealth: Payer: Self-pay | Admitting: *Deleted

## 2019-04-07 ENCOUNTER — Other Ambulatory Visit: Payer: Self-pay | Admitting: General Surgery

## 2019-04-07 DIAGNOSIS — K3532 Acute appendicitis with perforation and localized peritonitis, without abscess: Secondary | ICD-10-CM

## 2019-04-07 NOTE — Telephone Encounter (Signed)
Transition Care Management Follow-up Telephone Call   Date discharged? 04/06/19   How have you been since you were released from the hospital? "Better than I was"   Do you understand why you were in the hospital? yes   Do you understand the discharge instructions? yes   Where were you discharged to? Home.   Items Reviewed:  Medications reviewed: yes, They added amoxicillin and oxycodone.  Allergies reviewed: no new allergies  Dietary changes reviewed: yes  Referrals reviewed: yes   Functional Questionnaire:   Activities of Daily Living (ADLs):   She states they are independent in the following: ambulation, bathing and hygiene, feeding, continence, grooming, toileting and dressing States they require assistance with the following: na   Any transportation issues/concerns?: no   Any patient concerns? No    Confirmed importance and date/time of follow-up visits scheduled yes  Provider Appointment booked with PCP 04/09/19  Confirmed with patient if condition begins to worsen call PCP or go to the ER.  Patient was given the office number and encouraged to call back with question or concerns.  : yes

## 2019-04-09 ENCOUNTER — Other Ambulatory Visit: Payer: Self-pay

## 2019-04-09 ENCOUNTER — Ambulatory Visit (INDEPENDENT_AMBULATORY_CARE_PROVIDER_SITE_OTHER): Payer: Medicare Other | Admitting: Family

## 2019-04-09 ENCOUNTER — Encounter: Payer: Self-pay | Admitting: Family

## 2019-04-09 VITALS — BP 127/82 | HR 83

## 2019-04-09 DIAGNOSIS — K3532 Acute appendicitis with perforation and localized peritonitis, without abscess: Secondary | ICD-10-CM

## 2019-04-09 MED ORDER — ONDANSETRON HCL 4 MG PO TABS: 4.0000 mg | ORAL_TABLET | Freq: Three times a day (TID) | ORAL | 0 refills | Status: DC | PRN

## 2019-04-09 NOTE — Patient Instructions (Signed)
  Below are two ways to schedule a Covid-19 Vaccine:  Please visit [email or web address] to register or call [phone]  Or call:  [facility] Covid-19 vaccine scheduling at [phone]

## 2019-04-09 NOTE — Progress Notes (Signed)
Virtual Visit via Video Note  I connected with Cassidy Harrell on 04/09/19 at 11:00 AM EST by a and verified that I am speaking with the correct person using two identifiers. We initially connected via video, but she had some technical difficulties with audio and we transitioned to a telephone visit instead.   Location: Patient: home Provider: work   I discussed the limitations of evaluation and management by telemedicine and the availability of in person appointments. The patient expressed understanding and agreed to proceed.  History of Present Illness:  Patient is a 69 year old female who presents today for hospital follow-up.  We last saw the patient on February 2 for virtual visit to discuss abdominal pain.  Due to description of acute abdominal symptoms the patient was sent to the emergency department where she was diagnosed with acute appendicitis with perforation.  She was admitted February 2 through April 06, 2019.  During her hospitalization she was placed on IV antibiotics (zosyn).  A drain was placed by interventional radiology on April 02, 2019. Culture data for abscess drainage grew the following:  [place] FLORA PRESENT.   She was discharged home on a 7 day course of augmentin. She has follow up scheduled with interventional radiology on 04/14/18.    Reports no significant abdominal pain, tolerating PO's. Denies fever.  Reports that she is moving her bowels. Has had some AM nausea.   Reports that drainage is very scant, "can't even measure it."    Past Medical History:  Diagnosis Date  . Arthritis   . Depression   . Gum disease   . Heart murmur   . History of syphilis   . Hypertension   . Irregular heart beat   . Personal history of colonic polyps   . Phlebitis   . Pre-diabetes      Social History   Socioeconomic History  . Marital status: Divorced    Spouse name: Not on file  . Number of  children: 1  . Years of education: Not on file  . Highest education level: Not on file  Occupational History  . Not on file  Tobacco Use  . Smoking status: Former Smoker    Types: Cigarettes  . Smokeless tobacco: Never Used  Substance and Sexual Activity  . Alcohol use: No  . Drug use: No  . Sexual activity: Not on file  Other Topics Concern  . 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 Difficulty of Paying Living Expenses: Not on file  Food Insecurity:   . Worried About Charity fundraiser in the Last Year: Not on file  . Ran Out of Food in the Last Year: Not on file  Transportation Needs:   . Lack of Transportation (Medical): Not on file  . Lack of Transportation (Non-Medical): Not on file  Physical Activity:   . Days of Exercise per Week: Not on file  . Minutes of Exercise  per Session: Not on file  Stress:   . Feeling of Stress : Not on file  Social Connections:   . Frequency of Communication with Friends and Family: Not on file  . Frequency of Social Gatherings with Friends and Family: Not on file  . Attends Religious Services: Not on file  . Active Member of Clubs or Organizations: Not on file  . Attends Archivist Meetings: Not on file  . Marital Status: Not on file  Intimate Partner Violence:   . Fear of Current or Ex-Partner: Not on file  . Emotionally Abused: Not on file  . Physically Abused: Not on file  . Sexually Abused: Not on file    Past Surgical History:  Procedure Laterality Date  . SHOULDER ARTHROSCOPY Right 12/2015  .  SPINE SURGERY  08/20/2015   bulging / herniated disc in C-spine  . THERAPEUTIC ABORTION  1970. 1974, 1978    Family History  Problem Relation Age of Onset  . Diabetes Mother   . Cancer Father        lung cancer  . Kidney disease Father   . Seizures Father   . Cancer Brother        colon  . Heart attack Brother   . Stroke Brother   . Colon cancer Neg Hx   . Stomach cancer Neg Hx     Allergies  Allergen Reactions  . Aldactone [Spironolactone] Other (See Comments)    itching    Current Outpatient Medications on File Prior to Visit  Medication Sig Dispense Refill  . acetaminophen (TYLENOL) 650 MG CR tablet Take 650 mg by mouth every 8 (eight) hours as needed for pain.    Marland Kitchen amLODipine (NORVASC) 5 MG tablet TAKE 1 TABLET BY MOUTH  DAILY (Patient taking differently: Take 5 mg by mouth daily. ) 90 tablet 1  . amoxicillin-clavulanate (AUGMENTIN) 875-125 MG tablet Take 1 tablet by mouth every 12 (twelve) hours. 14 tablet 1  . aspirin 81 MG chewable tablet Chew 81 mg by mouth daily.      . blood glucose meter kit and supplies KIT Dispense based on patient and insurance preference. Use up to four times daily as directed. (FOR ICD-9 250.00, 250.01). 1 each 0  . Calcium Carbonate-Vitamin D (CALTRATE 600+D) 600-400 MG-UNIT per tablet Take 1 tablet by mouth 2 (two) times daily.    . Ferrous Gluconate (IRON) 240 (27 FE) MG TABS Take 1 tablet by mouth daily.      Marland Kitchen lisinopril (ZESTRIL) 40 MG tablet Take 1 tablet (40 mg total) by mouth daily. 30 tablet 0  . meloxicam (MOBIC) 7.5 MG tablet Take 1 tablet by mouth once daily (Patient taking differently: Take 7.5 mg by mouth daily. ) 30 tablet 0  . metoprolol succinate (TOPROL-XL) 25 MG 24 hr tablet Take 1 tablet (25 mg total) by mouth daily. 90 tablet 1  . oxyCODONE (OXY IR/ROXICODONE) 5 MG immediate release tablet Take 1-2 tablets (5-10 mg total) by mouth every 6 (six) hours as needed for moderate pain or severe pain (94m for moderate pain, 147mfor  severe pain). 10 tablet 0   No current facility-administered medications on file prior to visit.    BP 127/82 (BP Location: Left Arm, Patient Position: Sitting)   Pulse 83    Observations/Objective:   Gen: Awake, alert, no acute distress Resp: Breathing is even and non-labored Psych: calm/pleasant demeanor Neuro: Alert and Oriented x 3, + facial symmetry, speech is clear.   Assessment  and Plan:  Perforated appendicitis- pt is scheduled for follow up imaging with interventional radiology and possible drain removal. She is clinically improving and tolerating PO's.  She is advised to continue antibiotics and keep her upcoming appointment for CT and IR follow up.    30 minutes spent on today's visit.   Follow Up Instructions:    I discussed the assessment and treatment plan with the patient. The patient was provided an opportunity to ask questions and all were answered. The patient agreed with the plan and demonstrated an understanding of the instructions.   The patient was advised to call back or seek an in-person evaluation if the symptoms worsen or if the condition fails to improve as anticipated.  [name], NP

## 2019-04-12 ENCOUNTER — Ambulatory Visit: Payer: Medicare Other | Attending: Internal Medicine

## 2019-04-12 DIAGNOSIS — Z23 Encounter for immunization: Secondary | ICD-10-CM | POA: Insufficient documentation

## 2019-04-12 NOTE — Progress Notes (Signed)
   Covid-19 Vaccination Clinic  Name:  Cassidy Harrell    MRN: [id number] DOB: Nov 28, 1950  04/12/2019  [name] was observed post Covid-19 immunization for 15 minutes without incidence. She was provided with Vaccine Information Sheet and instruction to access the V-Safe system.   [name] was instructed to call 911 with any severe reactions post vaccine: Marland Kitchen Difficulty breathing  . Swelling of your face and throat  . A fast heartbeat  . A bad rash all over your body  . Dizziness and weakness    Immunizations Administered    Name Date Dose VIS Date Route   Pfizer COVID-19 Vaccine 04/12/2019  3:12 PM 0.3 mL 02/07/2019 Intramuscular   Manufacturer: [place]   Lot: [id number]   [place]: [id number]

## 2019-04-14 NOTE — Discharge Summary (Signed)
Physician Discharge Summary  Patient ID: Cassidy Harrell MRN: [id number] DOB/AGE: [birth year] 68 y.o.  Admit date: 04/01/2019 Discharge date: 04/06/2019  Admission Diagnoses:  Perforated appendicitis with abscess/phlegmon containing appendicoliths Hypertension Prediabetes Hypokalemia Depression  Discharge Diagnoses:  Perforated appendicitis with abscess/phlegmon containing appendicoliths Hypertension Prediabetes Hypokalemia Depression   Active Problems:   Acute perforated appendicitis   PROCEDURES: IR drain placed right lower quadrant 04/02/2019  Hospital Course:  Patient is a 69 year old female who presented to the [facility] with abdominal pain.  Symptoms have been ongoing for at least 48 hours.  Pain was in the right lower quadrant there is no radiation of the pain it was worse with movement.  Pain is moderate to severe and better with rest.  CT scan showed a perforated appendix with a periappendiceal abscess contained appendicoliths.  Patient was transferred to [facility] and was seen by Dr. Erroll Luna and admitted.  She was seen by interventional radiology and an IR drain was placed on 04/02/2019.  She made slow progress but was ready for discharge on 04/06/2019 and discharged by Dr. Autumn Messing.  Plans for follow-up with Dr. Rosendo Gros and drain clinic.  I did not see the patient and dictation was taken from the progress notes, and other chart documentation.  She was continued on oral antibiotics at discharge.  CBC Latest Ref Rng & Units 04/05/2019 04/04/2019 04/02/2019  WBC 4.0 - 10.5 K/uL 8.5 10.9(H) 14.1(H)  Hemoglobin 12.0 - 15.0 g/dL 11.3(L) 10.7(L) 11.4(L)  Hematocrit 36.0 - 46.0 % 36.0 34.6(L) 36.6  Platelets 150 - 400 K/uL 243 225 213   CMP Latest Ref Rng & Units 04/05/2019 04/04/2019 04/04/2019  Glucose 70 - 99 mg/dL 138(H) 167(H) 179(H)  BUN 8 - 23 mg/dL <5(L) <5(L) <5(L)  Creatinine 0.44 - 1.00 mg/dL 0.67 0.76 0.73  Sodium 135 - 145 mmol/L 141 143 140   Potassium 3.5 - 5.1 mmol/L 3.8 3.4(L) 2.7(LL)  Chloride 98 - 111 mmol/L 107 107 106  CO2 22 - 32 mmol/L '25 25 25  ' Calcium 8.9 - 10.3 mg/dL 8.9 8.4(L) 8.2(L)  Total Protein 6.5 - 8.1 g/dL - - -  Total Bilirubin 0.3 - 1.2 mg/dL - - -  Alkaline Phos 38 - 126 U/L - - -  AST 15 - 41 U/L - - -  ALT 0 - 44 U/L - - -    Disposition: Discharge disposition: 01-Home or Self Care       Discharge Instructions    Call MD for:  difficulty breathing, headache or visual disturbances   Complete by: As directed    Call MD for:  extreme fatigue   Complete by: As directed    Call MD for:  hives   Complete by: As directed    Call MD for:  persistant dizziness or light-headedness   Complete by: As directed    Call MD for:  persistant nausea and vomiting   Complete by: As directed    Call MD for:  redness, tenderness, or signs of infection (pain, swelling, redness, odor or green/yellow discharge around incision site)   Complete by: As directed    Call MD for:  severe uncontrolled pain   Complete by: As directed    Call MD for:  temperature >100.4   Complete by: As directed    Diet - low sodium heart healthy   Complete by: As directed    Discharge instructions   Complete by: As directed    May shower.  Low residue diet. Empty drain, record output, recharge bulb daily   Increase activity slowly   Complete by: As directed    No wound care   Complete by: As directed      Allergies as of 04/06/2019      Reactions   Aldactone [spironolactone] Other (See Comments)   itching      Medication List    TAKE these medications   acetaminophen 650 MG CR tablet Commonly known as: TYLENOL Take 650 mg by mouth every 8 (eight) hours as needed for pain.   amLODipine 5 MG tablet Commonly known as: NORVASC TAKE 1 TABLET BY MOUTH  DAILY   amoxicillin-clavulanate 875-125 MG tablet Commonly known as: AUGMENTIN Take 1 tablet by mouth every 12 (twelve) hours.   aspirin 81 MG chewable tablet Chew 81  mg by mouth daily.   blood glucose meter kit and supplies Kit Dispense based on patient and insurance preference. Use up to four times daily as directed. (FOR ICD-9 250.00, 250.01).   Caltrate 600+D 600-400 MG-UNIT tablet Generic drug: Calcium Carbonate-Vitamin D Take 1 tablet by mouth 2 (two) times daily.   Iron 240 (27 Fe) MG Tabs Take 1 tablet by mouth daily.   lisinopril 40 MG tablet Commonly known as: ZESTRIL Take 1 tablet (40 mg total) by mouth daily.   meloxicam 7.5 MG tablet Commonly known as: MOBIC Take 1 tablet by mouth once daily   metoprolol succinate 25 MG 24 hr tablet Commonly known as: TOPROL-XL Take 1 tablet (25 mg total) by mouth daily.   oxyCODONE 5 MG immediate release tablet Commonly known as: Oxy IR/ROXICODONE Take 1-2 tablets (5-10 mg total) by mouth every 6 (six) hours as needed for moderate pain or severe pain (39m for moderate pain, 144mfor severe pain).      Follow-up Information    O'Debbrah AlarNP. Schedule an appointment as soon as possible for a visit in 1 week(s).   Specialty: Internal Medicine Contact information: [address] [place] [phone]        RaRalene OkMD. Schedule an appointment as soon as possible for a visit in 3 week(s).   Specialty: General Surgery Why: For follow up  Contact information: [address] [place] [phone] in 1 day(s).   Why: Please call to schedule follow up with the drain clinic.  Contact information: [phone]         Signed: JEEarnstine Regal/15/2021, 12:11 PM

## 2019-04-15 ENCOUNTER — Other Ambulatory Visit: Payer: Self-pay | Admitting: General Surgery

## 2019-04-15 ENCOUNTER — Encounter: Payer: Self-pay | Admitting: Radiology

## 2019-04-15 ENCOUNTER — Ambulatory Visit
Admission: RE | Admit: 2019-04-15 | Discharge: 2019-04-15 | Disposition: A | Discharge status: Home / Self Care | Payer: Medicare Other | Source: Ambulatory Visit | Attending: General Surgery | Admitting: General Surgery

## 2019-04-15 ENCOUNTER — Ambulatory Visit
Admission: RE | Admit: 2019-04-15 | Discharge: 2019-04-15 | Disposition: A | Discharge status: Home / Self Care | Payer: Medicare Other | Source: Ambulatory Visit | Attending: Radiology | Admitting: Radiology

## 2019-04-15 DIAGNOSIS — K3532 Acute appendicitis with perforation and localized peritonitis, without abscess: Secondary | ICD-10-CM

## 2019-04-15 DIAGNOSIS — K3533 Acute appendicitis with perforation and localized peritonitis, with abscess: Secondary | ICD-10-CM | POA: Diagnosis not present

## 2019-04-15 HISTORY — PX: IR RADIOLOGIST EVAL & MGMT: IMG5224

## 2019-04-15 MED ORDER — IOPAMIDOL (ISOVUE-300) INJECTION 61%
100.0000 mL | Freq: Once | INTRAVENOUS | Status: AC | PRN
  Administered 2019-04-15: 14:00:00 100 mL via INTRAVENOUS

## 2019-04-15 NOTE — Progress Notes (Signed)
Referring Physician(s): Dr. Rosendo Gros  Chief Complaint: Abdominal pain appendiceal abscess  History of present illness: 69 y.o, Cassidy Harrell outpatient. History of RLQ abdominal pain X 2 days found to have and periappendiceal abscess.  IR placed an abscess drain on 2.3.2. patient presents for abscess drain evaluation. Patient is currently taking antibiotics (augmentin) and report scant output. Patient is able to flush drain. Patient denies any fevers,  vomiting, or abdominal pain. Patient endorses nausea Per discharge summary dated 2.7.21  Patient scheduled  to follow up with Dr. Rosendo Gros on 3.3.21   Past Medical History:  Diagnosis Date  . Arthritis   . Depression   . Gum disease   . Heart murmur   . History of syphilis   . Hypertension   . Irregular heart beat   . Personal history of colonic polyps   . Phlebitis   . Pre-diabetes     Past Surgical History:  Procedure Laterality Date  . SHOULDER ARTHROSCOPY Right 12/2015  . SPINE SURGERY  08/20/2015   bulging / herniated disc in C-spine  . THERAPEUTIC ABORTION  1970. 1974, 1978    Allergies: Aldactone [spironolactone]  Medications: Prior to Admission medications   Medication Sig Start Date End Date Taking? Authorizing Provider  acetaminophen (TYLENOL) 650 MG CR tablet Take 650 mg by mouth every 8 (eight) hours as needed for pain.    [provider]  amLODipine (NORVASC) 5 MG tablet TAKE 1 TABLET BY MOUTH  DAILY Patient taking differently: Take 5 mg by mouth daily.  02/23/19   [name], NP  amoxicillin-clavulanate (AUGMENTIN) 875-125 MG tablet Take 1 tablet by mouth every 12 (twelve) hours. 04/06/19   Jovita Kussmaul, MD  aspirin 81 MG chewable tablet Chew 81 mg by mouth daily.      [provider]  blood glucose meter kit and supplies KIT Dispense based on patient and insurance preference. Use up to four times daily as directed. (FOR ICD-9 250.00, 250.01). 02/10/19   [name], NP  Calcium  Carbonate-Vitamin D (CALTRATE 600+D) 600-400 MG-UNIT per tablet Take 1 tablet by mouth 2 (two) times daily.    [provider]  Ferrous Gluconate (IRON) 240 (27 FE) MG TABS Take 1 tablet by mouth daily.      [provider]  lisinopril (ZESTRIL) 40 MG tablet Take 1 tablet (40 mg total) by mouth daily. 12/24/18   [name], NP  meloxicam (MOBIC) 7.5 MG tablet Take 1 tablet by mouth once daily Patient taking differently: Take 7.5 mg by mouth daily.  12/24/18   [name], NP  metoprolol succinate (TOPROL-XL) 25 MG 24 hr tablet Take 1 tablet (25 mg total) by mouth daily. 12/24/18   [name], NP  ondansetron (ZOFRAN) 4 MG tablet Take 1 tablet (4 mg total) by mouth every 8 (eight) hours as needed for nausea or vomiting. 04/09/19   [name], NP  oxyCODONE (OXY IR/ROXICODONE) 5 MG immediate release tablet Take 1-2 tablets (5-10 mg total) by mouth every 6 (six) hours as needed for moderate pain or severe pain (75m for moderate pain, 162mfor severe pain). 04/06/19   ToJovita KussmaulMD     Family History  Problem Relation Age of Onset  . Diabetes Mother   . Cancer Father        lung cancer  . Kidney disease Father   . Seizures Father   . Cancer Brother        colon  . Heart attack Brother   .  Stroke Brother   . Colon cancer Neg Hx   . Stomach cancer Neg Hx     Social History   Socioeconomic History  . Marital status: Divorced    Spouse name: Not on file  . Number of children: 1  . Years of education: Not on file  . Highest education level: Not on file  Occupational History  . Not on file  Tobacco Use  . Smoking status: Former Smoker    Types: Cigarettes  . Smokeless tobacco: Never Used  Substance and Sexual Activity  . Alcohol use: No  . Drug use: No  . Sexual activity: Not on file  Other Topics Concern  . 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 Difficulty of Paying Living Expenses: Not on file  Food Insecurity:   . Worried About Charity fundraiser in the Last Year: Not on file  . Ran Out of Food in the Last Year: Not on file  Transportation Needs:   . Lack of Transportation (Medical): Not on file  . Lack of Transportation (Non-Medical): Not on file  Physical Activity:   . Days of Exercise per Week: Not on file  . Minutes of Exercise per Session: Not on file  Stress:   . Feeling of Stress : Not on file  Social Connections:   . Frequency of Communication with Friends and Family: Not on file  . Frequency of Social Gatherings with Friends and Family: Not on file  . Attends Religious Services: Not on file  . Active Member of Clubs or Organizations: Not on file  . Attends Archivist Meetings: Not on file  . Marital Status: Not on file     Vital Signs: There were no vitals taken for this visit.  Physical Exam Vitals and nursing note reviewed.  Constitutional:      Appearance: She is well-developed.  HENT:     Head: Normocephalic and atraumatic.  Eyes:     Conjunctiva/sclera: Conjunctivae normal.  Pulmonary:     Effort: Pulmonary effort is normal.  Musculoskeletal:        General: Normal range of motion.     Cervical back: Normal range of motion.  Skin:    General: Skin is warm.     Comments: drain site is unremarkable with no erythema, tenderness or drainage noted. Suture and stat lock in place. Dressing is clean dry and intact. 2 ml of  serosanguinous phlegmon colored fluid noted in the  bulb suction device.  Neurological:     Mental Status: She is alert and  oriented to person, place, and time.     Imaging: No results found.  Labs:  CBC: Recent Labs    04/01/19 1904 04/02/19 0425 04/04/19 0450 04/05/19 0803  WBC 18.2* 14.1* 10.9* 8.5  HGB 12.8 11.4* 10.7* 11.3*  HCT 40.4 36.6 34.6* 36.0  PLT 236 213 225 243    COAGS: Recent Labs    04/02/19 0834  INR 1.2  BMP: Recent Labs    04/02/19 0425 04/04/19 0450 04/04/19 1243 04/05/19 0803  NA 141 140 143 141  K 3.5 2.7* 3.4* 3.8  CL 104 106 107 107  CO2 '26 25 25 25  ' GLUCOSE 148* 179* 167* 138*  BUN 10 <5* <5* <5*  CALCIUM 8.7* 8.2* 8.4* 8.9  CREATININE 0.88 0.73 0.76 0.67  GFRNONAA >60 >60 >60 >60  GFRAA >60 >60 >60 >60    LIVER FUNCTION TESTS: Recent Labs    04/01/19 1904 04/02/19 0425  BILITOT 0.4 0.6  AST 18 15  ALT 20 15  ALKPHOS 81 77  PROT 8.3* 7.4  ALBUMIN 3.7 3.3*    Assessment:  69 y.o, Cassidy Harrell outpatient. History of RLQ abdominal pain X 2 days found to have and periappendiceal abscess.  IR placed an abscess drain on 2.3.2. Patient presents for abscess drain evaluation. Patient completed  antibiotics (augmentin) course on 2.14.21 and report scant output. Patient is able to flush drain daily. Patient denies any fevers, vomiting, or abdominal pain. Patient endorses nausea. Patient scheduled to follow up with Dr. Rosendo Gros on 3.3.21. Abscessogram performed on 2.16.21 shows possible fistula. Patient's drain placed to gravity drainage at this time.   Pertinent Imaging 2.16.21 - Ct abd pelvis  Pertinent IR History 2.3.21 - Placement RLQ appendical absess  Pertinent Allergies none  All labs and medications are within acceptable parameters. Patient is afebrile.  Recommend patient continue with flushing daily 5 ml, output recording and dressing changes as needed. Patient to return to IR drain clinic for abscessogram in 2 weeks time.    Signed: Avel Peace, NP 04/15/2019, 2:21 PM   Please refer to Dr. Anselm Pancoast attestation of this note for  management and plan.

## 2019-04-17 ENCOUNTER — Other Ambulatory Visit: Payer: Self-pay | Admitting: Family

## 2019-04-22 ENCOUNTER — Encounter: Payer: Self-pay | Admitting: Family

## 2019-04-29 ENCOUNTER — Ambulatory Visit
Admission: RE | Admit: 2019-04-29 | Discharge: 2019-04-29 | Disposition: A | Discharge status: Home / Self Care | Payer: Medicare Other | Source: Ambulatory Visit | Attending: General Surgery | Admitting: General Surgery

## 2019-04-29 DIAGNOSIS — K3532 Acute appendicitis with perforation and localized peritonitis, without abscess: Secondary | ICD-10-CM

## 2019-04-29 DIAGNOSIS — K3533 Acute appendicitis with perforation and localized peritonitis, with abscess: Secondary | ICD-10-CM | POA: Diagnosis not present

## 2019-04-29 DIAGNOSIS — K382 Diverticulum of appendix: Secondary | ICD-10-CM | POA: Diagnosis not present

## 2019-04-29 DIAGNOSIS — K632 Fistula of intestine: Secondary | ICD-10-CM | POA: Diagnosis not present

## 2019-04-29 HISTORY — PX: IR RADIOLOGIST EVAL & MGMT: IMG5224

## 2019-04-29 NOTE — Progress Notes (Signed)
Referring Physician(s): Ramirez,Armando  Chief Complaint: The patient is seen in follow up today s/p drainage of a RLQ/appendiceal abscess on 04/02/2019  History of present illness: Cassidy Harrell is a 69 year old female with history of perforated appendicitis and associated abscess formation who underwent CT-guided drain placement on 04/02/2019.  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  She presents again today for follow-up drain injection.  According to the patient she has been stable since her last visit.  She currently denies fever, headache, chest pain, dyspnea, cough, worsening abdominal pain, vomiting or abnormal bleeding.  She has had some recent constipation although did have small bowel movement this morning.  She also has some occasional nausea.  She completed a course of antibiotic therapy on 04/13/2019.  Previous drain fluid cultures grew E. coli and Proteus.  She is flushing her drain with 5 cc sterile normal saline once daily.  Output has been minimal amount of beige-colored fluid. Past Medical History:  Diagnosis Date  . Arthritis   . Depression   . Gum disease   . Heart murmur   . History of syphilis   . Hypertension   . Irregular heart beat   . Personal history of colonic polyps   . Phlebitis   . Pre-diabetes     Past Surgical History:  Procedure Laterality Date  . IR RADIOLOGIST EVAL & MGMT  04/15/2019  . SHOULDER ARTHROSCOPY Right 12/2015  . SPINE SURGERY  08/20/2015   bulging / herniated disc in C-spine  . THERAPEUTIC ABORTION  1970. 1974, 1978    Allergies: Aldactone [spironolactone]  Medications: Prior to Admission medications   Medication Sig Start Date End Date Taking? Authorizing Provider  acetaminophen (TYLENOL) 650 MG CR tablet Take 650 mg by mouth every 8 (eight) hours as needed for pain.    [provider]   amLODipine (NORVASC) 5 MG tablet TAKE 1 TABLET BY MOUTH  DAILY Patient taking differently: Take 5 mg by mouth daily.  02/23/19   [name], NP  amoxicillin-clavulanate (AUGMENTIN) 875-125 MG tablet Take 1 tablet by mouth every 12 (twelve) hours. 04/06/19   Jovita Kussmaul, MD  aspirin 81 MG chewable tablet Chew 81 mg by mouth daily.      [provider]  blood glucose meter kit and supplies KIT Dispense based on patient and insurance preference. Use up to four times daily as directed. (FOR ICD-9 250.00, 250.01). 02/10/19   [name], NP  Calcium Carbonate-Vitamin D (CALTRATE 600+D) 600-400 MG-UNIT per tablet Take 1 tablet by mouth 2 (two) times daily.    [provider]  Ferrous Gluconate (IRON) 240 (27 FE) MG TABS Take 1 tablet by mouth daily.      [provider]  lisinopril (ZESTRIL) 40 MG tablet TAKE 1 TABLET BY MOUTH  DAILY 04/18/19   [name], NP  meloxicam (MOBIC) 7.5 MG tablet Take 1 tablet (7.5 mg total) by mouth daily. 04/18/19   [name], NP  metoprolol succinate (TOPROL-XL) 25 MG 24 hr tablet TAKE 1 TABLET BY MOUTH  DAILY 04/18/19   [name], NP  ondansetron (ZOFRAN) 4 MG tablet Take 1 tablet (4 mg total) by mouth every 8 (eight) hours as needed for nausea or vomiting. 04/09/19   [name], NP  oxyCODONE (OXY IR/ROXICODONE) 5 MG immediate release tablet Take 1-2 tablets (5-10 mg total) by mouth every 6 (six) hours as needed  for moderate pain or severe pain (78m for moderate pain, 170mfor severe pain). 04/06/19   ToJovita KussmaulMD     Family History  Problem Relation Age of Onset  . Diabetes Mother   . Cancer Father        lung cancer  . Kidney disease Father   . Seizures Father   . Cancer Brother        colon  . Heart attack Brother   . Stroke Brother   . Colon cancer Neg Hx   . Stomach cancer Neg Hx     Social History   Socioeconomic History  . Marital status: Divorced    Spouse name: Not  on file  . Number of children: 1  . Years of education: Not on file  . Highest education level: Not on file  Occupational History  . Not on file  Tobacco Use  . Smoking status: Former Smoker    Types: Cigarettes  . Smokeless tobacco: Never Used  Substance and Sexual Activity  . Alcohol use: No  . Drug use: No  . Sexual activity: Not on file  Other Topics Concern  . 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 Difficulty of Paying Living Expenses: Not on file  Food Insecurity:   . Worried About RuCharity fundraisern the Last Year: Not on file  . Ran Out of Food in the Last Year: Not on file  Transportation Needs:   . Lack of Transportation (Medical): Not on file  . Lack of Transportation (Non-Medical): Not on file  Physical Activity:   . Days of Exercise per Week: Not on file  . Minutes of Exercise per Session: Not on file  Stress:   . Feeling of Stress : Not on file  Social Connections:   . Frequency of Communication with Friends and Family: Not on file  . Frequency of Social Gatherings with Friends and Family: Not on file  . Attends Religious Services: Not on file  . Active Member of Clubs or Organizations: Not on file  . Attends ClArchivisteetings: Not on file  . Marital Status: Not on file     Vital Signs: Blood pressure 138/76, heart rate 83, temperature 98.1, O2 sat 99% room air   Physical Exam awake, alert.  Right lower quadrant drain intact, insertion site okay, not significantly tender.  Very small amount of beige-colored fluid in  drain bag.  Imaging: No results found.  Labs:  CBC: Recent Labs    04/01/19 1904 04/02/19 0425 04/04/19 0450 04/05/19 0803  WBC 18.2* 14.1* 10.9* 8.5  HGB 12.8 11.4* 10.7* 11.3*  HCT 40.4 36.6 34.6* 36.0  PLT 236 213 225 243    COAGS: Recent Labs    04/02/19 0834  INR 1.2    BMP: Recent Labs    04/02/19 0425 04/04/19 0450 04/04/19 1243 04/05/19 0803  NA 141 140 143 141  K 3.5 2.7* 3.4* 3.8  CL  104 106 107 107  CO2 '26 25 25 25  ' GLUCOSE 148* 179* 167* 138*  BUN 10 <5* <5* <5*  CALCIUM 8.7* 8.2* 8.4* 8.9  CREATININE 0.88 0.73 0.76 0.67  GFRNONAA >60 >60 >60 >60  GFRAA >60 >60 >60 >60    LIVER FUNCTION TESTS: Recent Labs    04/01/19 1904 04/02/19 0425  BILITOT 0.4 0.6  AST 18 15  ALT 20 15  ALKPHOS 81 77  PROT 8.3* 7.4  ALBUMIN 3.7 3.3*    Assessment: 69 year old female with history of perforated appendicitis and associated abscess formation who underwent CT-guided drain placement on 04/02/2019.  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  She presents again today for follow-up drain injection.  According to the patient she has been stable since her last visit.  She currently denies fever, headache, chest pain, dyspnea, cough, worsening abdominal pain, vomiting or abnormal bleeding.  She has had some recent constipation although did have small bowel movement this morning.  She also has some occasional nausea.  She completed a course of antibiotic therapy on 04/13/2019.  Previous drain fluid cultures grew E. coli and Proteus.  She is flushing her drain with 5 cc sterile normal saline once daily.  Output has been minimal amount of beige-colored fluid.  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  She is scheduled to follow-up with Dr. Rosendo Gros tomorrow.  Recommend she continue drain irrigation once  daily with 5 cc sterile normal saline, output recording and dressing changes as needed.  Prescription for saline flushes was given to patient.  Further plans as per Dr. Rosendo Gros.   Signed: D. Rowe Robert, PA-C 04/29/2019, 1:53 PM   Please refer to [place] attestation of this note for management and plan.      Patient ID: Cassidy Harrell, female   DOB: 1950/03/12, 68 y.o.   MRN: [id number]

## 2019-05-05 ENCOUNTER — Other Ambulatory Visit: Payer: Self-pay | Admitting: General Surgery

## 2019-05-05 DIAGNOSIS — K3533 Acute appendicitis with perforation and localized peritonitis, with abscess: Secondary | ICD-10-CM

## 2019-05-06 ENCOUNTER — Ambulatory Visit: Payer: Medicare Other | Attending: Internal Medicine

## 2019-05-06 DIAGNOSIS — Z23 Encounter for immunization: Secondary | ICD-10-CM | POA: Insufficient documentation

## 2019-05-06 NOTE — Progress Notes (Signed)
   Covid-19 Vaccination Clinic  Name:  Cassidy Harrell    MRN: [id number] DOB: Nov 30, 1950  05/06/2019  Ms. [name] was observed post Covid-19 immunization for 15 minutes without incident. She was provided with Vaccine Information Sheet and instruction to access the V-Safe system.   Ms. [name] was instructed to call 911 with any severe reactions post vaccine: Marland Kitchen Difficulty breathing  . Swelling of face and throat  . A fast heartbeat  . A bad rash all over body  . Dizziness and weakness   Immunizations Administered    Name Date Dose VIS Date Route   Pfizer COVID-19 Vaccine 05/06/2019 11:57 AM 0.3 mL 02/07/2019 Intramuscular   Manufacturer: [place]   Lot: [id number]   Arial: [id number]

## 2019-05-08 ENCOUNTER — Ambulatory Visit
Admission: RE | Admit: 2019-05-08 | Discharge: 2019-05-08 | Disposition: A | Discharge status: Home / Self Care | Payer: Medicare Other | Source: Ambulatory Visit | Attending: General Surgery | Admitting: General Surgery

## 2019-05-08 ENCOUNTER — Encounter: Payer: Self-pay | Admitting: *Deleted

## 2019-05-08 DIAGNOSIS — K382 Diverticulum of appendix: Secondary | ICD-10-CM | POA: Diagnosis not present

## 2019-05-08 DIAGNOSIS — K3533 Acute appendicitis with perforation and localized peritonitis, with abscess: Secondary | ICD-10-CM | POA: Diagnosis not present

## 2019-05-08 HISTORY — PX: IR RADIOLOGIST EVAL & MGMT: IMG5224

## 2019-05-08 NOTE — Progress Notes (Signed)
Referring Physician(s): Ramirez,Armando  Chief Complaint: The patient is seen in follow up today s/p  Successful CT-guided right lower quadrant appendiceal abscess drain insertion 04/02/19  History of present illness:  Ruptured appendix with abscess Drain placed 04/02/19 Most recent follow up in [facility] 04/29/19 revealed + fistula to bowel Pt follow with Dr Rosendo Gros.  OP has been scant for weeks Denies fever or chills Finished antibiotic course 04/13/19  Note and order  from Dr Rosendo Gros office asking IR to remove drain today. No additional imaging requested at all. (see order and note in scanned documents)    Past Medical History:  Diagnosis Date  . Arthritis   . Depression   . Gum disease   . Heart murmur   . History of syphilis   . Hypertension   . Irregular heart beat   . Personal history of colonic polyps   . Phlebitis   . Pre-diabetes     Past Surgical History:  Procedure Laterality Date  . IR RADIOLOGIST EVAL & MGMT  04/15/2019  . IR RADIOLOGIST EVAL & MGMT  04/29/2019  . SHOULDER ARTHROSCOPY Right 12/2015  . SPINE SURGERY  08/20/2015   bulging / herniated disc in C-spine  . THERAPEUTIC ABORTION  1970. 1974, 1978    Allergies: Aldactone [spironolactone]  Medications: Prior to Admission medications   Medication Sig Start Date End Date Taking? Authorizing Provider  acetaminophen (TYLENOL) 650 MG CR tablet Take 650 mg by mouth every 8 (eight) hours as needed for pain.    [provider]  amLODipine (NORVASC) 5 MG tablet TAKE 1 TABLET BY MOUTH  DAILY Patient taking differently: Take 5 mg by mouth daily.  02/23/19   [name], NP  amoxicillin-clavulanate (AUGMENTIN) 875-125 MG tablet Take 1 tablet by mouth every 12 (twelve) hours. 04/06/19   Jovita Kussmaul, MD  aspirin 81 MG chewable tablet Chew 81 mg by mouth daily.      [provider]  blood glucose meter kit and supplies KIT Dispense based on patient and insurance preference. Use up  to four times daily as directed. (FOR ICD-9 250.00, 250.01). 02/10/19   [name], NP  Calcium Carbonate-Vitamin D (CALTRATE 600+D) 600-400 MG-UNIT per tablet Take 1 tablet by mouth 2 (two) times daily.    [provider]  Ferrous Gluconate (IRON) 240 (27 FE) MG TABS Take 1 tablet by mouth daily.      [provider]  lisinopril (ZESTRIL) 40 MG tablet TAKE 1 TABLET BY MOUTH  DAILY 04/18/19   [name], NP  meloxicam (MOBIC) 7.5 MG tablet Take 1 tablet (7.5 mg total) by mouth daily. 04/18/19   [name], NP  metoprolol succinate (TOPROL-XL) 25 MG 24 hr tablet TAKE 1 TABLET BY MOUTH  DAILY 04/18/19   [name], NP  ondansetron (ZOFRAN) 4 MG tablet Take 1 tablet (4 mg total) by mouth every 8 (eight) hours as needed for nausea or vomiting. 04/09/19   [name], NP  oxyCODONE (OXY IR/ROXICODONE) 5 MG immediate release tablet Take 1-2 tablets (5-10 mg total) by mouth every 6 (six) hours as needed for moderate pain or severe pain (4m for moderate pain, 132mfor severe pain). 04/06/19   ToJovita KussmaulMD     Family History  Problem Relation Age of Onset  . Diabetes Mother   . Cancer Father        lung cancer  . Kidney disease Father   . Seizures Father   . Cancer Brother  colon  . Heart attack Brother   . Stroke Brother   . Colon cancer Neg Hx   . Stomach cancer Neg Hx     Social History   Socioeconomic History  . Marital status: Divorced    Spouse name: Not on file  . Number of children: 1  . Years of education: Not on file  . Highest education level: Not on file  Occupational History  . Not on file  Tobacco Use  . Smoking status: Former Smoker    Types: Cigarettes  . Smokeless tobacco: Never Used  Substance and Sexual Activity  . Alcohol use: No  . Drug use: No  . Sexual activity: Not on file  Other Topics Concern  . 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 Difficulty of Paying Living Expenses:   Food Insecurity:   . Worried About Charity fundraiser in the Last Year:   . Arboriculturist in the Last Year:   Transportation Needs:   . Film/video editor (Medical):   Marland Kitchen Lack of Transportation (Non-Medical):   Physical Activity:   . Days of Exercise per Week:   . Minutes of Exercise per Session:   Stress:   . Feeling of Stress :   Social Connections:   . Frequency of Communication with Friends and Family:   . Frequency of Social Gatherings with Friends and Family:   . Attends Religious Services:   . Active Member of Clubs or Organizations:   . Attends Archivist Meetings:   Marland Kitchen Marital Status:      Vital Signs: There were no vitals taken for this visit.  Physical Exam Skin:    General: Skin is warm and dry.     Comments: Site is clean and dry NT no bleeding Some crusted material at site. No sign of infection  Drain removal without complication New dressing placed     Imaging: No results found.  Labs:  CBC: Recent Labs    04/01/19 1904 04/02/19 0425 04/04/19 0450 04/05/19 0803  WBC 18.2* 14.1* 10.9* 8.5  HGB 12.8 11.4* 10.7* 11.3*  HCT 40.4 36.6 34.6* 36.0  PLT 236 213 225 243    COAGS: Recent Labs    04/02/19 0834  INR 1.2    BMP: Recent Labs    04/02/19 0425 04/04/19 0450 04/04/19 1243 04/05/19 0803  NA 141 140 143 141  K 3.5 2.7* 3.4* 3.8  CL 104 106 107 107  CO2 '26 25 25 25  ' GLUCOSE 148* 179* 167* 138*  BUN 10 <5* <5* <5*  CALCIUM 8.7* 8.2* 8.4* 8.9  CREATININE 0.88 0.73 0.76 0.67  GFRNONAA >60 >60 >60  >60  GFRAA >60 >60 >60 >60    LIVER FUNCTION TESTS: Recent Labs    04/01/19 1904 04/02/19 0425  BILITOT 0.4 0.6  AST 18 15  ALT 20 15  ALKPHOS 81 77  PROT 8.3* 7.4  ALBUMIN 3.7 3.3*  Assessment:  RLQ abscess drain removed today without complication Dressing placed Keep dry for 2 days May shower in 48-72 hrs. Keep appt with Dr Rosendo Gros 1 mo per chart  Signed: Lavonia Drafts, PA-C 05/08/2019, 1:59 PM   Please refer to Dr. Earleen Newport attestation of this note for management and plan.

## 2019-05-09 ENCOUNTER — Other Ambulatory Visit: Payer: Self-pay | Admitting: General Surgery

## 2019-05-09 DIAGNOSIS — K3532 Acute appendicitis with perforation and localized peritonitis, without abscess: Secondary | ICD-10-CM

## 2019-05-12 ENCOUNTER — Other Ambulatory Visit: Payer: Self-pay

## 2019-05-13 ENCOUNTER — Encounter: Payer: Self-pay | Admitting: Family

## 2019-05-13 ENCOUNTER — Ambulatory Visit (INDEPENDENT_AMBULATORY_CARE_PROVIDER_SITE_OTHER): Payer: Medicare Other | Admitting: Family

## 2019-05-13 ENCOUNTER — Other Ambulatory Visit: Payer: Self-pay

## 2019-05-13 VITALS — BP 142/70 | HR 78 | Temp 96.1°F | Resp 16 | Ht 63.0 in | Wt 184.0 lb

## 2019-05-13 DIAGNOSIS — E119 Type 2 diabetes mellitus without complications: Secondary | ICD-10-CM | POA: Diagnosis not present

## 2019-05-13 DIAGNOSIS — N83209 Unspecified ovarian cyst, unspecified side: Secondary | ICD-10-CM

## 2019-05-13 DIAGNOSIS — K3532 Acute appendicitis with perforation and localized peritonitis, without abscess: Secondary | ICD-10-CM | POA: Diagnosis not present

## 2019-05-13 DIAGNOSIS — I10 Essential (primary) hypertension: Secondary | ICD-10-CM | POA: Diagnosis not present

## 2019-05-13 MED ORDER — MELOXICAM 7.5 MG PO TABS: 7.5000 mg | ORAL_TABLET | Freq: Every day | ORAL | 5 refills | Status: DC

## 2019-05-13 NOTE — Progress Notes (Signed)
Subjective:    Patient ID: Cassidy Harrell, female    DOB: 04-Jul-1950, 69 y.o.   MRN: [id number]  HPI  Patient is a 69 yr old female who presents today for follow up.  HTN- she is maintained on lisinopril 76m,  amlodipine 549m and toprol xl 2560mReports home bp's generally in the 130's.    BP Readings from Last 3 Encounters:  05/13/19 (!) 142/70  04/29/19 138/76  04/15/19 (!) 147/72   Perforated appendicitis- had drain out on Thursday. Has a follow up appointment with surgery scheduled to discuss appendectomy.  She reports that she is feeling "so much better."   Had follow up US [facility]  1. Periappendiceal abscess has resolved since placement of the percutaneous drain. Percutaneous drain remains in the right lower quadrant adjacent to the appendix. Decreased inflammatory changes associated with the appendix. 2. Appendix continues to be prominent for size and compatible with underlying inflammatory changes. 3. Persistent cystic structures involving bilateral ovaries. Bilateral ovarian cysts were noted on ultrasound from 10/01/2018. Based on patient's age, recommend a follow-up pelvic ultrasound to ensure that these cystic structures are simple. Evidence for a uterine fibroid.  Review of Systems    see HPI  Past Medical History:  Diagnosis Date  . Arthritis   . Depression   . Gum disease   . Heart murmur   . History of syphilis   . Hypertension   . Irregular heart beat   . Personal history of colonic polyps   . Phlebitis   . Pre-diabetes      Social History   Socioeconomic History  . Marital status: Divorced    Spouse name: Not on file  . Number of children: 1  . Years of education: Not on file  . Highest education level: Not on file  Occupational History  . Not on file  Tobacco Use  . Smoking status: Former Smoker    Types: Cigarettes  . Smokeless tobacco: Never Used  Substance and Sexual Activity  . Alcohol use: No  . Drug use: No  . Sexual  activity: Not on file  Other Topics Concern  . 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 Difficulty of Paying Living Expenses:   Food Insecurity:   . Worried About RunCharity fundraiser the Last Year:   . RanArboriculturist the Last Year:   Transportation Needs:   . LacFilm/video editoredical):   . LMarland Kitchenck of Transportation (Non-Medical):   Physical Activity:   . Days of Exercise per Week:   . Minutes of Exercise per Session:   Stress:   . Feeling of Stress :   Social Connections:   . Frequency of Communication with Friends and Family:   . Frequency of Social Gatherings with Friends and Family:   . Attends Religious Services:   . Active Member of Clubs or Organizations:   . Attends CluArchivistetings:   . MMarland Kitchenrital  Status:   Intimate Partner Violence:   . Fear of Current or Ex-Partner:   . Emotionally Abused:   Marland Kitchen Physically Abused:   . Sexually Abused:     Past Surgical History:  Procedure Laterality Date  . IR RADIOLOGIST EVAL & MGMT  04/15/2019  . IR RADIOLOGIST EVAL & MGMT  04/29/2019  . IR RADIOLOGIST EVAL & MGMT  05/08/2019  . SHOULDER ARTHROSCOPY Right 12/2015  . SPINE SURGERY  08/20/2015   bulging / herniated disc in C-spine  . THERAPEUTIC ABORTION  1970. 1974, 1978    Family History  Problem Relation Age of Onset  . Diabetes Mother   . Cancer Father        lung cancer  . Kidney disease Father   . Seizures Father   . Cancer Brother        colon  . Heart attack Brother   . Stroke Brother   . Colon cancer Neg Hx   . Stomach  cancer Neg Hx     Allergies  Allergen Reactions  . Aldactone [Spironolactone] Other (See Comments)    itching    Current Outpatient Medications on File Prior to Visit  Medication Sig Dispense Refill  . acetaminophen (TYLENOL) 650 MG CR tablet Take 650 mg by mouth every 8 (eight) hours as needed for pain.    Marland Kitchen amLODipine (NORVASC) 5 MG tablet TAKE 1 TABLET BY MOUTH  DAILY (Patient taking differently: Take 5 mg by mouth daily. ) 90 tablet 1  . aspirin 81 MG chewable tablet Chew 81 mg by mouth daily.      . blood glucose meter kit and supplies KIT Dispense based on patient and insurance preference. Use up to four times daily as directed. (FOR ICD-9 250.00, 250.01). 1 each 0  . Calcium Carbonate-Vitamin D (CALTRATE 600+D) 600-400 MG-UNIT per tablet Take 1 tablet by mouth 2 (two) times daily.    . Ferrous Gluconate (IRON) 240 (27 FE) MG TABS Take 1 tablet by mouth daily.      Marland Kitchen lisinopril (ZESTRIL) 40 MG tablet TAKE 1 TABLET BY MOUTH  DAILY 90 tablet 3  . metoprolol succinate (TOPROL-XL) 25 MG 24 hr tablet TAKE 1 TABLET BY MOUTH  DAILY 90 tablet 3  . ondansetron (ZOFRAN) 4 MG tablet Take 1 tablet (4 mg total) by mouth every 8 (eight) hours as needed for nausea or vomiting. 20 tablet 0   No current facility-administered medications on file prior to visit.    BP (!) 142/70   Pulse 78   Temp (!) 96.1 F (35.6 C) (Temporal)   Resp 16   Ht '5\' 3"'  (1.6 m)   Wt 184 lb (83.5 kg)   SpO2 100%   BMI 32.59 kg/m    Objective:   Physical Exam Constitutional:      Appearance: She is well-developed.  Cardiovascular:     Rate and Rhythm: Normal rate and regular rhythm.     Heart sounds: Normal heart sounds. No murmur.  Pulmonary:     Effort: Pulmonary effort is normal. No respiratory distress.     Breath sounds: Normal breath sounds. No wheezing.  Psychiatric:        Behavior: Behavior normal.        Thought Content: Thought content normal.        Judgment: Judgment normal.             Assessment & Plan:  HTN- bp acceptable. Continue current meds/doses.    Perforated appendicitis- clinically improved.  Has upcoming appointment with the surgeon.  Ovarian cysts- will repeat pelvic US at radiologist's suggestion.

## 2019-05-14 NOTE — Addendum Note (Signed)
Addended by: [name] on: 05/14/2019 01:11 PM   Modules accepted: Orders

## 2019-05-20 ENCOUNTER — Ambulatory Visit (HOSPITAL_BASED_OUTPATIENT_CLINIC_OR_DEPARTMENT_OTHER)
Admission: RE | Admit: 2019-05-20 | Discharge: 2019-05-20 | Disposition: A | Discharge status: Home / Self Care | Payer: Medicare Other | Source: Ambulatory Visit | Attending: Family | Admitting: Family

## 2019-05-20 ENCOUNTER — Encounter (HOSPITAL_BASED_OUTPATIENT_CLINIC_OR_DEPARTMENT_OTHER): Payer: Self-pay

## 2019-05-20 ENCOUNTER — Other Ambulatory Visit: Payer: Self-pay

## 2019-05-20 ENCOUNTER — Other Ambulatory Visit (HOSPITAL_BASED_OUTPATIENT_CLINIC_OR_DEPARTMENT_OTHER): Payer: Self-pay | Admitting: Family

## 2019-05-20 ENCOUNTER — Other Ambulatory Visit (HOSPITAL_BASED_OUTPATIENT_CLINIC_OR_DEPARTMENT_OTHER): Payer: Self-pay | Admitting: General Surgery

## 2019-05-20 ENCOUNTER — Other Ambulatory Visit: Payer: Self-pay | Admitting: Family

## 2019-05-20 DIAGNOSIS — R102 Pelvic and perineal pain: Secondary | ICD-10-CM

## 2019-05-20 DIAGNOSIS — N83209 Unspecified ovarian cyst, unspecified side: Secondary | ICD-10-CM | POA: Insufficient documentation

## 2019-05-20 DIAGNOSIS — Z1231 Encounter for screening mammogram for malignant neoplasm of breast: Secondary | ICD-10-CM

## 2019-05-21 ENCOUNTER — Encounter: Payer: Self-pay | Admitting: Family

## 2019-05-29 ENCOUNTER — Telehealth: Payer: Self-pay | Admitting: Family

## 2019-05-29 NOTE — Progress Notes (Signed)
  Chronic Care Management   Outreach Note  05/29/2019 Name: Cassidy Harrell MRN: [id number] DOB: 10-02-1950  Referred by: [name], NP Reason for referral : No chief complaint on file.   An unsuccessful telephone outreach was attempted today. The patient was referred to the pharmacist for assistance with care management and care coordination.   Follow Up Plan:   [name] [employer] Scheduler

## 2019-05-30 ENCOUNTER — Telehealth: Payer: Self-pay | Admitting: Family

## 2019-05-30 NOTE — Progress Notes (Signed)
  Chronic Care Management   Note  05/30/2019 Name: Cassidy Harrell MRN: [id number] DOB: September 14, 1950  Cassidy Harrell is a 69 y.o. year old female who is a primary care patient of [name], NP. I reached out to [name] by phone today in response to a referral sent by Ms. [name]'s PCP, [name], NP.   [name] was given information about Chronic Care Management services today including:  1. CCM service includes personalized support from designated clinical staff supervised by her physician, including individualized plan of care and coordination with other care providers 2. 24/7 contact phone numbers for assistance for urgent and routine care needs. 3. Service will only be billed when office clinical staff spend 20 minutes or more in a month to coordinate care. 4. Only one practitioner may furnish and bill the service in a calendar month. 5. The patient may stop CCM services at any time (effective at the end of the month) by phone call to the office staff.   Patient agreed to services and verbal consent obtained.   Follow up plan:   [name] [employer] Scheduler

## 2019-06-03 ENCOUNTER — Other Ambulatory Visit: Payer: Self-pay

## 2019-06-03 ENCOUNTER — Ambulatory Visit
Admission: RE | Admit: 2019-06-03 | Discharge: 2019-06-03 | Disposition: A | Discharge status: Home / Self Care | Payer: Medicare Other | Source: Ambulatory Visit | Attending: General Surgery | Admitting: General Surgery

## 2019-06-03 DIAGNOSIS — K3580 Unspecified acute appendicitis: Secondary | ICD-10-CM | POA: Diagnosis not present

## 2019-06-03 DIAGNOSIS — K3532 Acute appendicitis with perforation and localized peritonitis, without abscess: Secondary | ICD-10-CM

## 2019-06-03 MED ORDER — IOPAMIDOL (ISOVUE-300) INJECTION 61%
100.0000 mL | Freq: Once | INTRAVENOUS | Status: AC | PRN
  Administered 2019-06-03: 100 mL via INTRAVENOUS

## 2019-06-06 ENCOUNTER — Ambulatory Visit: Payer: Self-pay | Admitting: General Surgery

## 2019-06-06 DIAGNOSIS — K3532 Acute appendicitis with perforation and localized peritonitis, without abscess: Secondary | ICD-10-CM | POA: Diagnosis not present

## 2019-06-06 NOTE — H&P (Signed)
  History of Present Illness Ralene Ok MD; 06/06/2019 4:01 PM) The patient is a 69 year old female who presents with a complaint of perforated appendicitis. Patient is a 69 year old female with a two-month history of history of perforated appendicitis. Patient states that the draineither to drain the perforated appendicitis has been removed. She's been doing well otherwise. 7 fevers at home. Patient does have a follow-up CT scan that I reviewed personally. There appears to be some minimal inflammatory changes around the appendix out this appears to be well improved. I did review the CT scan personally.   Allergies (Tanisha A. Owens Shark, [place]; 06/06/2019 3:55 PM) Aldactone *DIURETICS*  Allergies Reconciled   Medication History (Tanisha A. Owens Shark, [place]; 06/06/2019 3:55 PM) Mobic (7.5MG  Tablet, Oral) Active. Metoprolol Succinate ER (25MG  Tablet ER 24HR, Oral) Active. Tylenol 8 Hour (650MG  Tablet ER, Oral) Active. Ferrous Gluconate (240 (27 Fe)MG Tablet, Oral) Active. Zofran (4MG  Tablet, Oral) Active. Norvasc (5MG  Tablet, Oral) Active. Lisinopril (40MG  Tablet, Oral daily) Active. Aspirin (81MG  Tablet, Oral) Active. Caltrate 600 + D (600-200MG -IU Tablet, Oral) Active. Medications Reconciled  Vitals (Tanisha A. Brown RMA; 06/06/2019 3:56 PM) 06/06/2019 3:55 PM Weight: 187.6 lb Height: 62in Body Surface Area: 1.86 m Body Mass Index: 34.31 kg/m  Temp.: 97.63F  Pulse: 88 (Regular)  BP: 136/82 (Sitting, Left Arm, Standard)       Physical Exam Ralene Ok MD; 06/06/2019 4:01 PM) The physical exam findings are as follows: Note:Constitutional: No acute distress, conversant, appears stated age  Eyes: Anicteric sclerae, moist conjunctiva, no lid lag  Neck: No thyromegaly, trachea midline, no cervical lymphadenopathy  Lungs: Clear to auscultation biilaterally, normal respiratory effot  Cardiovascular: regular rate & rhythm, no murmurs, no peripheal edema, pedal  pulses 2+  GI: Soft, no masses or hepatosplenomegaly, non-tender to palpation  MSK: Normal gait, no clubbing cyanosis, edema  Skin: No rashes, palpation reveals normal skin turgor  Psychiatric: Appropriate judgment and insight, oriented to person, place, and time    Assessment & Plan Ralene Ok MD; 06/06/2019 4:02 PM) PERFORATED APPENDICITIS (K35.32) Impression: Patient is a 69 year old female with history of perforated appendicitis, status post IR drain placement-this has been removed. Patient underwent CT scan which revealed minimal inflammatory changes around the appendix. We will schedule her for lap appendectomy. I discussed the risks and benefits of the procedure to include but not limited to: Infection, bleeding, damage to structures, possible bowel leakage. Patient was understanding and wished to proceed.

## 2019-06-06 NOTE — H&P (View-Only) (Signed)
  History of Present Illness Ralene Ok MD; 06/06/2019 4:01 PM) The patient is a 69 year old female who presents with a complaint of perforated appendicitis. Patient is a 69 year old female with a two-month history of history of perforated appendicitis. Patient states that the draineither to drain the perforated appendicitis has been removed. She's been doing well otherwise. 7 fevers at home. Patient does have a follow-up CT scan that I reviewed personally. There appears to be some minimal inflammatory changes around the appendix out this appears to be well improved. I did review the CT scan personally.   Allergies (Tanisha A. Owens Shark, [place]; 06/06/2019 3:55 PM) Aldactone *DIURETICS*  Allergies Reconciled   Medication History (Tanisha A. Owens Shark, [place]; 06/06/2019 3:55 PM) Mobic (7.5MG  Tablet, Oral) Active. Metoprolol Succinate ER (25MG  Tablet ER 24HR, Oral) Active. Tylenol 8 Hour (650MG  Tablet ER, Oral) Active. Ferrous Gluconate (240 (27 Fe)MG Tablet, Oral) Active. Zofran (4MG  Tablet, Oral) Active. Norvasc (5MG  Tablet, Oral) Active. Lisinopril (40MG  Tablet, Oral daily) Active. Aspirin (81MG  Tablet, Oral) Active. Caltrate 600 + D (600-200MG -IU Tablet, Oral) Active. Medications Reconciled  Vitals (Tanisha A. Brown RMA; 06/06/2019 3:56 PM) 06/06/2019 3:55 PM Weight: 187.6 lb Height: 62in Body Surface Area: 1.86 m Body Mass Index: 34.31 kg/m  Temp.: 97.27F  Pulse: 88 (Regular)  BP: 136/82 (Sitting, Left Arm, Standard)       Physical Exam Ralene Ok MD; 06/06/2019 4:01 PM) The physical exam findings are as follows: Note:Constitutional: No acute distress, conversant, appears stated age  Eyes: Anicteric sclerae, moist conjunctiva, no lid lag  Neck: No thyromegaly, trachea midline, no cervical lymphadenopathy  Lungs: Clear to auscultation biilaterally, normal respiratory effot  Cardiovascular: regular rate & rhythm, no murmurs, no peripheal edema, pedal  pulses 2+  GI: Soft, no masses or hepatosplenomegaly, non-tender to palpation  MSK: Normal gait, no clubbing cyanosis, edema  Skin: No rashes, palpation reveals normal skin turgor  Psychiatric: Appropriate judgment and insight, oriented to person, place, and time    Assessment & Plan Ralene Ok MD; 06/06/2019 4:02 PM) PERFORATED APPENDICITIS (K35.32) Impression: Patient is a 69 year old female with history of perforated appendicitis, status post IR drain placement-this has been removed. Patient underwent CT scan which revealed minimal inflammatory changes around the appendix. We will schedule her for lap appendectomy. I discussed the risks and benefits of the procedure to include but not limited to: Infection, bleeding, damage to structures, possible bowel leakage. Patient was understanding and wished to proceed.

## 2019-06-24 ENCOUNTER — Other Ambulatory Visit: Payer: Self-pay

## 2019-06-24 ENCOUNTER — Ambulatory Visit: Payer: Medicare Other | Admitting: Pharmacist

## 2019-06-24 DIAGNOSIS — E119 Type 2 diabetes mellitus without complications: Secondary | ICD-10-CM

## 2019-06-24 DIAGNOSIS — I10 Essential (primary) hypertension: Secondary | ICD-10-CM

## 2019-06-24 NOTE — Chronic Care Management (AMB) (Signed)
Chronic Care Management Pharmacy  Name: Cassidy Harrell  MRN: [id number] DOB: 30-Jun-1950   Chief Complaint/ HPI  Cassidy Harrell,  69 y.o. , female presents for their Initial CCM visit with the clinical pharmacist via telephone due to COVID-19 Pandemic.  PCP : [name], NP  Their chronic conditions include: Diabetes, Hypertension, Pain  Office Visits: 05/13/19: Visit w/ [name], NP - HTN. BP acceptable continue current meds. Perforated appendicitis. Clinically improved appt with surgeon. Ovarian cysts evaluated with [place]. No med changes noted  04/09/19: Visit w/ [name], NP - Previous visit was virtual to discuss abd pain. Referred pt to ED. She was admitted 2/2-2/7 w/ dx of acute appendicitis with perforation. Places on IV Zosyn. Discharged with 7D course of Augmentin. Pt to see interventional radiology. No med changes noted  04/01/19: Visit w/ [name], NP - Acute appendicitis. Referred patient to ED  02/10/19: Visit w/ [name], NP - Meter to assess DM sent to patient's pharmacy. Recommended pt to D/C ibuprofen and continue meloxicam.   02/04/19: Visit w/ Mackie Pai, PA-C - COVID Exposure (pt tested negative)   Consult Visit: None in last 6 months  Medications: Outpatient Encounter Medications as of 06/24/2019  Medication Sig  . acetaminophen (TYLENOL) 650 MG CR tablet Take 650 mg by mouth every 8 (eight) hours as needed for pain.  Marland Kitchen amLODipine (NORVASC) 5 MG tablet TAKE 1 TABLET BY MOUTH  DAILY (Patient taking differently: Take 5 mg by mouth daily with lunch. )  . aspirin EC 81 MG tablet Take 81 mg by mouth daily with lunch.  . blood glucose meter kit and supplies KIT Dispense based on patient and insurance preference. Use up to four times daily as directed. (FOR ICD-9 250.00, 250.01).  . calcium carbonate (OSCAL) 1500 (600 Ca) MG TABS tablet Take 1,200 mg by mouth daily with lunch. 800 units vitamin D  . ferrous sulfate  325 (65 FE) MG EC tablet Take 325 mg by mouth daily with lunch.  . lisinopril (ZESTRIL) 40 MG tablet TAKE 1 TABLET BY MOUTH  DAILY (Patient taking differently: Take 40 mg by mouth daily with lunch. )  . meloxicam (MOBIC) 7.5 MG tablet Take 1 tablet (7.5 mg total) by mouth daily. (Patient taking differently: Take 7.5 mg by mouth daily with lunch. )  . Menthol, Topical Analgesic, (BIOFREEZE EX) Apply 1 application topically 3 (three) times daily as needed (pain.).  Marland Kitchen metoprolol succinate (TOPROL-XL) 25 MG 24 hr tablet TAKE 1 TABLET BY MOUTH  DAILY (Patient taking differently: Take 25 mg by mouth daily with lunch. )  . ondansetron (ZOFRAN) 4 MG tablet Take 1 tablet (4 mg total) by mouth every 8 (eight) hours as needed for nausea or vomiting.   No facility-administered encounter medications on file as of 06/24/2019.     Current Diagnosis/Assessment:  Goals Addressed            This Visit's Progress   . Pharmacy Care Plan       CARE PLAN ENTRY  Current Barriers:  . Chronic Disease Management support, education, and care coordination needs related to Diabetes, Hypertension, Pain   Hypertension . Pharmacist Clinical Goal(s): o Over the next 90 days, patient will work with PharmD and providers to achieve BP goal <140/90 . Current regimen:  o Amlodipine 47m daily, lisinopril 469mdaily, metoprolol succinate 2529maily . Interventions: o Requested patient to check blood pressure 3 times per week and record . Patient self care activities -  Over the next 90 days, patient will: o Check BP three times weekly, document, and provide at future appointments o Ensure daily salt intake < 2300 mg/Merideth Bosque  Diabetes . Pharmacist Clinical Goal(s): o Over the next 90 days, patient will work with PharmD and providers to achieve A1c goal <7% . Current regimen:  o Diet and exercise management   . Interventions: o Requested patient to reduce ice cream intake to 3 days a week versus every Shirely Toren o Requested  patient to check blood sugar 3 times per week and record . Patient self care activities - Over the next 90 days, patient will: o Check blood sugar three times per week document, and provide at future appointments o Contact provider with any episodes of hypoglycemia  Pain . Pharmacist Clinical Goal(s) o Over the next 90 days, patient will work with PharmD and providers to reduce patient's symptoms of pain . Current regimen:  o Meloxicam 7.66m daily, biofreeze as needed, aspercreme as needed . Interventions: o Consider increasing meloxicam to 123mdaily to help with pain . Patient self care activities - Over the next 90 days, patient will: o Maintain pain medication regimen  Medication management . Pharmacist Clinical Goal(s): o Over the next 90 days, patient will work with PharmD and providers to maintain optimal medication adherence . Current pharmacy: [facility] Interventions o Comprehensive medication review performed. o Continue current medication management strategy . Patient self care activities - Over the next 90 days, patient will: o Focus on medication adherence by filling medications appropriately  o Take medications as prescribed o Report any questions or concerns to PharmD and/or provider(s)  Initial goal documentation       Social Hx:  [place] Has one son age [age]Works at the [employer] Grandaughters age [phone] [place] in gymnastics Divorced since 1996.  Diabetes   Recent Relevant Labs: Lab Results  Component Value Date/Time   HGBA1C 6.2 (H) 06/25/2019 01:35 PM   HGBA1C 7.1 (H) 11/05/2018 02:36 PM   MICROALBUR 0.7 09/16/2014 02:32 PM   MICROALBUR 0.8 11/24/2013 08:39 AM    A1c goal <7% Fasting 80-130 Post prandial <180  Checking BG: Rarely (once a month)  Recent FBG Readings: last month 179 (can't recall if this was after or before eating food) Patient has failed these meds in past: None noted  Patient is  currently uncontrolled on the following medications: None  Last diabetic Eye exam:  Lab Results  Component Value Date/Time   HMDIABEYEEXA No Retinopathy 03/26/2018 12:00 AM    Last diabetic Foot exam: No results found for: HMDIABFOOTEX   Diet:  Chicken, fish, tuKuwaitegetables: Zucchini, squash, broccoli, potatoes Pinto beans Weakness is ice cream and cookies. Eats ice cream daily. Serving size cup to a bowl. May have klondike bar or ice cream sandwich.   We discussed: diet and exercise extensively  Plan -Reduce ice cream intake to 3 days a week versus every Keyaria Lawson -Check blood sugar 3 times per week and record -Continue control with diet and exercise   Hypertension   CMP Latest Ref Rng & Units 06/25/2019 04/05/2019 04/04/2019  Glucose 70 - 99 mg/dL 104(H) 138(H) 167(H)  BUN 8 - 23 mg/dL 10 <5(L) <5(L)  Creatinine 0.44 - 1.00 mg/dL 0.69 0.67 0.76  Sodium 135 - 145 mmol/L 143 141 143  Potassium 3.5 - 5.1 mmol/L 3.8 3.8 3.4(L)  Chloride 98 - 111 mmol/L 108 107 107  CO2 22 - 32 mmol/L 24  25 25  Calcium 8.9 - 10.3 mg/dL 9.3 8.9 8.4(L)  Total Protein 6.5 - 8.1 g/dL - - -  Total Bilirubin 0.3 - 1.2 mg/dL - - -  Alkaline Phos 38 - 126 U/L - - -  AST 15 - 41 U/L - - -  ALT 0 - 44 U/L - - -    BP today is: Unable to assess due to phone visit   Office blood pressures are  BP Readings from Last 3 Encounters:  06/30/19 140/72  06/25/19 140/72  05/13/19 (!) 142/70   Blood pressure goal <140/90  Patient has failed these meds in the past: spironolactone (itching), diltiazem (d/c when pt had AV heart block?) Patient is currently uncontrolled on the following medications: amlodipine 8m daily, lisinopril 437mdaily, metoprolol succinate 2522maily  Patient checks BP at home infrequently  Patient home BP readings are ranging: N/A  We discussed Importance of med adherence. Proper blood pressure measurement technique   Plan -Check blood pressure 3 times per week and record -Continue  current medications   Pain    Patient has failed these meds in past: None noted  Patient is currently controlled on the following medications: meloxicam 7.5mg67mily, biofreeze PRN, aspercreme PRN  Acetaminophen Once or twice a week  "I use about any kind of cream you can think of because I'm always in pain" She doesn't use all creams at same time. "None of them seem to the job" "Anything that will keep me out of pain is good with me"   Dr. ElsnEllene Routepinal fusion surgery and cortisone shots  We discussed:  Risk/benefit of increasing meloxicam noting kidney function and blood pressure   Plan -Consider increasing meloxicam to 15mg38mly   Vaccines   Reviewed and discussed patient's vaccination history.    Immunization History  Administered Date(s) Administered  . Fluad Quad(high Dose 65+) 11/05/2018  . Influenza Split 11/13/2011  . Influenza, High Dose Seasonal PF 03/10/2016, 01/02/2017, 02/08/2018  . Influenza,inj,Quad PF,6+ Mos 11/24/2013  . Influenza-Unspecified 11/27/2012  . PFIZER SARS-COV-2 Vaccination 04/12/2019, 05/06/2019  . Pneumococcal Conjugate-13 04/04/2017  . Pneumococcal Polysaccharide-23 11/24/2013  . Tdap 04/01/2013  . Zoster 05/28/2015    Plan -Recommended patient receive Shingrix vaccine in pharmacy.    Miscellaneous Meds  Meds to D/C from list Ondansetron

## 2019-06-24 NOTE — Progress Notes (Signed)
Cassidy Harrell [name], Cassidy Harrell [place] [address] Phone: [phone] Fax: [phone]      Your procedure is scheduled on Monday, Jun 30, 2019.  Report to [facility] Main Entrance "A" at 7:15 A.M., and check in at the Admitting office.  Call this number if you have problems the morning of surgery:  [phone]  Call [phone] if you have any questions prior to your surgery date Monday-Friday 8am-4pm    Remember:  Do not eat or drink after midnight the night before your surgery     Take these medicines the morning of surgery with A SIP OF WATER:   acetaminophen (TYLENOL) - if needed ondansetron [facility]) - if needed  Follow your surgeon's instructions on when to stop Aspirin.  If no instructions were given by your surgeon then you will need to call the office to get those instructions.     As of today, STOP taking any Aspirin (unless otherwise instructed by your surgeon) and Aspirin containing products, Aleve, Naproxen, Ibuprofen, Motrin, Advil, Goody's, BC's, all herbal medications, fish oil, and all vitamins.    WHAT DO I DO ABOUT MY DIABETES MEDICATION?   HOW TO MANAGE YOUR DIABETES BEFORE AND AFTER SURGERY  Why is it important to control my blood sugar before and after surgery? . Improving blood sugar levels before and after surgery helps healing and can limit problems. . A way of improving blood sugar control is eating a healthy diet by: o  Eating less sugar and carbohydrates o  Increasing activity/exercise o  Talking with your doctor about reaching your blood sugar goals . High blood sugars (greater than 180 mg/dL) can raise your risk of infections and slow your recovery, so you will need to focus on controlling your diabetes during the weeks before surgery. . Make sure that the doctor who takes care of your diabetes knows about your planned surgery including the  date and location.  How do I manage my blood sugar before surgery? . Check your blood sugar at least 4 times a day, starting 2 days before surgery, to make sure that the level is not too high or low. . Check your blood sugar the morning of your surgery when you wake up and every 2 hours until you get to the Short Stay unit. o If your blood sugar is less than 70 mg/dL, you will need to treat for low blood sugar: - Do not take insulin. - Treat a low blood sugar (less than 70 mg/dL) with  cup of clear juice (cranberry or apple), 4 glucose tablets, OR glucose gel. - Recheck blood sugar in 15 minutes after treatment (to make sure it is greater than 70 mg/dL). If your blood sugar is not greater than 70 mg/dL on recheck, call [phone] for further instructions. . Report your blood sugar to the short stay nurse when you get to Short Stay.  . If you are admitted to the hospital after surgery: o Your blood sugar will be checked by the staff and you will probably be given insulin after surgery (instead of oral diabetes medicines) to make sure you have good blood sugar levels. o The goal for blood sugar control after surgery is 80-180 mg/dL.                      Do not wear jewelry, make up, or nail polish  Do not wear lotions, powders, perfumes, or deodorant.            Do not shave 48 hours prior to surgery.            Do not bring valuables to the hospital.            [facility] is not responsible for any belongings or valuables.  Do NOT Smoke (Tobacco/Vapping) or drink Alcohol 24 hours prior to your procedure If you use a CPAP at night, you may bring all equipment for your overnight stay.   Contacts, glasses, dentures or bridgework may not be worn into surgery.      For patients admitted to the hospital, discharge time will be determined by your treatment team.   Patients discharged the day of surgery will not be allowed to drive home, and someone needs to stay with them for 24  hours.    Special instructions:   [facility]- Preparing For Surgery  Before surgery, you can play an important role. Because skin is not sterile, your skin needs to be as free of germs as possible. You can reduce the number of germs on your skin by washing with CHG (chlorahexidine gluconate) Soap before surgery.  CHG is an antiseptic cleaner which kills germs and bonds with the skin to continue killing germs even after washing.    Oral Hygiene is also important to reduce your risk of infection.  Remember - BRUSH YOUR TEETH THE MORNING OF SURGERY WITH YOUR REGULAR TOOTHPASTE  Please do not use if you have an allergy to CHG or antibacterial soaps. If your skin becomes reddened/irritated stop using the CHG.  Do not shave (including legs and underarms) for at least 48 hours prior to first CHG shower. It is OK to shave your face.  Please follow these instructions carefully.   1. Shower the NIGHT BEFORE SURGERY and the MORNING OF SURGERY with CHG Soap.   2. If you chose to wash your hair, wash your hair first as usual with your normal shampoo.  3. After you shampoo, rinse your hair and body thoroughly to remove the shampoo.  4. Use CHG as you would any other liquid soap. You can apply CHG directly to the skin and wash gently with a scrungie or a clean washcloth.   5. Apply the CHG Soap to your body ONLY FROM THE NECK DOWN.  Do not use on open wounds or open sores. Avoid contact with your eyes, ears, mouth and genitals (private parts). Wash Face and genitals (private parts)  with your normal soap.   6. Wash thoroughly, paying special attention to the area where your surgery will be performed.  7. Thoroughly rinse your body with warm water from the neck down.  8. DO NOT shower/wash with your normal soap after using and rinsing off the CHG Soap.  9. Pat yourself dry with a CLEAN TOWEL.  10. Wear CLEAN PAJAMAS to bed the night before surgery, wear comfortable clothes the morning of  surgery  11. Place CLEAN SHEETS on your bed the night of your first shower and DO NOT SLEEP WITH PETS.   Day of Surgery:   Do not apply any deodorants/lotions.  Please wear clean clothes to the hospital/surgery center.   Remember to brush your teeth WITH YOUR REGULAR TOOTHPASTE.   Please read over the following fact sheets that you were given.

## 2019-06-25 ENCOUNTER — Other Ambulatory Visit: Payer: Self-pay

## 2019-06-25 ENCOUNTER — Encounter (HOSPITAL_COMMUNITY): Payer: Self-pay

## 2019-06-25 ENCOUNTER — Encounter (HOSPITAL_COMMUNITY)
Admission: RE | Admit: 2019-06-25 | Discharge: 2019-06-25 | Disposition: A | Discharge status: Home / Self Care | Payer: Medicare Other | Source: Ambulatory Visit | Attending: General Surgery | Admitting: General Surgery

## 2019-06-25 DIAGNOSIS — Z01812 Encounter for preprocedural laboratory examination: Secondary | ICD-10-CM | POA: Diagnosis not present

## 2019-06-25 DIAGNOSIS — K3532 Acute appendicitis with perforation and localized peritonitis, without abscess: Secondary | ICD-10-CM | POA: Insufficient documentation

## 2019-06-25 DIAGNOSIS — Z79899 Other long term (current) drug therapy: Secondary | ICD-10-CM | POA: Insufficient documentation

## 2019-06-25 DIAGNOSIS — Z7982 Long term (current) use of aspirin: Secondary | ICD-10-CM | POA: Insufficient documentation

## 2019-06-25 HISTORY — DX: Cardiac arrhythmia, unspecified: I49.9

## 2019-06-25 HISTORY — DX: Anemia, unspecified: D64.9

## 2019-06-25 HISTORY — DX: Type 2 diabetes mellitus without complications: E11.9

## 2019-06-25 LAB — CBC
HCT: 42.2 % (ref 36.0–46.0)
Hemoglobin: 13.2 g/dL (ref 12.0–15.0)
MCH: 27 pg (ref 26.0–34.0)
MCHC: 31.3 g/dL (ref 30.0–36.0)
MCV: 86.5 fL (ref 80.0–100.0)
Platelets: 220 10*3/uL (ref 150–400)
RBC: 4.88 MIL/uL (ref 3.87–5.11)
RDW: 15 % (ref 11.5–15.5)
WBC: 5.2 10*3/uL (ref 4.0–10.5)
nRBC: 0 % (ref 0.0–0.2)

## 2019-06-25 LAB — BASIC METABOLIC PANEL
Anion gap: 11 (ref 5–15)
BUN: 10 mg/dL (ref 8–23)
CO2: 24 mmol/L (ref 22–32)
Calcium: 9.3 mg/dL (ref 8.9–10.3)
Chloride: 108 mmol/L (ref 98–111)
Creatinine, Ser: 0.69 mg/dL (ref 0.44–1.00)
GFR calc Af Amer: >60 mL/min (ref >60)
GFR calc non Af Amer: >60 mL/min (ref >60)
Glucose, Bld: 104 mg/dL — ABNORMAL HIGH (ref 70–99)
Potassium: 3.8 mmol/L (ref 3.5–5.1)
Sodium: 143 mmol/L (ref 135–145)

## 2019-06-25 LAB — HEMOGLOBIN A1C
Hgb A1c MFr Bld: 6.2 % — ABNORMAL HIGH (ref 4.8–5.6)
Mean Plasma Glucose: 131.24 mg/dL

## 2019-06-25 LAB — GLUCOSE, CAPILLARY: Glucose-Capillary: 107 mg/dL — ABNORMAL HIGH (ref 70–99)

## 2019-06-25 NOTE — Progress Notes (Signed)
PCP - [name], NP Cardiologist - Denies  PPM/ICD - Denies  Chest x-ray - N/A EKG - 04/02/19 Stress Test - 07/13/15 ECHO - 07/13/15 Cardiac Cath - N/A  Sleep Study - Denies  Pt states that she rarely checks her blood sugar. Pt instructed to check her blood sugar more often and at least 4X/day starting 2 days prior to surgery.  Blood Thinner Instructions: N/A Aspirin Instructions: Pt instructed to continue as prescribed.  ERAS Protcol - No  COVID TEST- 06/26/19   Coronavirus Screening  Have you experienced the following symptoms:  Cough yes/no: No Fever (>100.28F)  yes/no: No Runny nose yes/no: No Sore throat yes/no: No Difficulty breathing/shortness of breath  yes/no: No  Have you or a family member traveled in the last 14 days and where? yes/no: No   If the patient indicates "YES" to the above questions, their PAT will be rescheduled to limit the exposure to others and, the surgeon will be notified. THE PATIENT WILL NEED TO BE ASYMPTOMATIC FOR 14 DAYS.   If the patient is not experiencing any of these symptoms, the PAT nurse will instruct them to NOT bring anyone with them to their appointment since they may have these symptoms or traveled as well.   Please remind your patients and families that hospital visitation restrictions are in effect and the importance of the restrictions.     Anesthesia review: Yes, cardiac hx. Previous abnormal EKGs  Patient denies shortness of breath, fever, cough and chest pain at PAT appointment   All instructions explained to the patient, with a verbal understanding of the material. Patient agrees to go over the instructions while at home for a better understanding. Patient also instructed to self quarantine after being tested for COVID-19. The opportunity to ask questions was provided.

## 2019-06-26 ENCOUNTER — Other Ambulatory Visit (HOSPITAL_COMMUNITY)
Admission: RE | Admit: 2019-06-26 | Discharge: 2019-06-26 | Disposition: A | Discharge status: Home / Self Care | Payer: Medicare Other | Source: Ambulatory Visit | Attending: General Surgery | Admitting: General Surgery

## 2019-06-26 DIAGNOSIS — Z01812 Encounter for preprocedural laboratory examination: Secondary | ICD-10-CM | POA: Insufficient documentation

## 2019-06-26 DIAGNOSIS — Z20822 Contact with and (suspected) exposure to covid-19: Secondary | ICD-10-CM | POA: Insufficient documentation

## 2019-06-26 LAB — SARS CORONAVIRUS 2 (TAT 6-24 HRS): SARS Coronavirus 2: NEGATIVE

## 2019-06-26 NOTE — Anesthesia Preprocedure Evaluation (Addendum)
Anesthesia Evaluation  Patient identified by MRN, date of birth, ID band Patient awake    Reviewed: Allergy & Precautions, H&P , NPO status , Patient's Chart, lab work & pertinent test results  Airway Mallampati: I  TM Distance: >3 FB Neck ROM: Full    Dental no notable dental hx. (+) Partial Upper, Partial Lower, Poor Dentition, Missing,    Pulmonary neg pulmonary ROS, former smoker,    Pulmonary exam normal breath sounds clear to auscultation       Cardiovascular Exercise Tolerance: Good hypertension, Pt. on medications and Pt. on home beta blockers negative cardio ROS Normal cardiovascular exam+ dysrhythmias  Rhythm:Regular Rate:Normal     Neuro/Psych PSYCHIATRIC DISORDERS Depression  Neuromuscular disease negative neurological ROS  negative psych ROS   GI/Hepatic negative GI ROS, Neg liver ROS,   Endo/Other  negative endocrine ROSdiabetes, Type 2Hypothyroidism   Renal/GU negative Renal ROS  negative genitourinary   Musculoskeletal negative musculoskeletal ROS (+) Arthritis , Osteoarthritis,    Abdominal   Peds negative pediatric ROS (+)  Hematology negative hematology ROS (+) Blood dyscrasia, anemia ,   Anesthesia Other Findings   Reproductive/Obstetrics negative OB ROS                           Anesthesia Physical Anesthesia Plan  ASA: III  Anesthesia Plan: General   Post-op Pain Management:    Induction: Intravenous  PONV Risk Score and Plan: 3 and Ondansetron, Treatment may vary due to age or medical condition and Midazolam  Airway Management Planned: Oral ETT and LMA  Additional Equipment:   Intra-op Plan:   Post-operative Plan: Extubation in OR  Informed Consent: I have reviewed the patients History and Physical, chart, labs and discussed the procedure including the risks, benefits and alternatives for the proposed anesthesia with the patient or authorized  representative who has indicated his/her understanding and acceptance.       Plan Discussed with: CRNA and Anesthesiologist  Anesthesia Plan Comments: (Pt seen by cardiologist Dr. Radford Pax in 2017 for eval of palpitations and atypical chest pain. 30d event monitor showed NSR, no arrhythmias. Echo showed EF 40-45%, grade 1 dd, mild AR, mild MR. Nuclear stress test was normal, low risk, EF 55-65%.  Dr. Radford Pax advised pt to followup in HTN clinic with pharmacist, which she did at that time. BP is now followed by PCP [name], NP.  Recently admitted 2/2-04/06/19 for Perforated appendicitis with abscess/phlegmon containing appendicoliths. IR drain was placed on 04/02/2019, has since been removed. Followup CT abd/pelvis 06/03/19 showed persistent but improved inflammatory changes about the Appendix.  Preop labs reviewed, unremarkable.  EKG 04/01/19:  Sinus rhythm. Rate 95. Borderline prolonged PR interval. Low voltage, precordial leads. Probable anteroseptal infarct, old. When compared to tracing from 05/19/15, I do not appreciate any significant change.   Nuclear stress 07/13/15: Nuclear stress EF: 57%. Upsloping ST segment depression ST segment depression was noted during stress in the V4, V5 and V6 leads, beginning at 3 minutes of stress, and returning to baseline after less than 1 minute of recovery. No T wave inversion was noted during stress. Defect 1: There is a small defect of moderate severity present in the mid anterior and apical anterior location. This is consistent with breast attenuation artifact. The study is normal. This is a low risk study. The left ventricular ejection fraction is normal (55-65%).   TTE 07/13/15: - Left ventricle: The cavity size was normal. Wall thickness was  normal.  Systolic function was mildly to moderately reduced. The  estimated ejection fraction was in the range of 40% to 45%.  Diffuse hypokinesis. Doppler parameters are consistent with   abnormal left ventricular relaxation (grade 1 diastolic  dysfunction). Doppler parameters are consistent with high  ventricular filling pressure.  - Aortic valve: There was mild regurgitation.  - Mitral valve: There was mild regurgitation.  - Left atrium: The atrium was mildly dilated.   Impressions:   - Mild to moderate global reduction in LV function; grade 1  diastolic dysfunction with elevated LV filling pressure; mild AI;  mild LAE; mild MR. )      Anesthesia Quick Evaluation

## 2019-06-26 NOTE — Progress Notes (Signed)
Anesthesia Chart Review:  Pt seen by cardiologist Dr. Radford Pax in 2017 for eval of palpitations and atypical chest pain. 30d event monitor showed NSR, no arrhythmias. Echo showed EF 40-45%, grade 1 dd, mild AR, mild MR. Nuclear stress test was normal, low risk, EF 55-65%.  Dr. Radford Pax advised pt to followup in HTN clinic with pharmacist, which she did at that time. BP is now followed by PCP [name], NP.  Recently admitted 2/2-04/06/19 for Perforated appendicitis with abscess/phlegmon containing appendicoliths. IR drain was placed on 04/02/2019, has since been removed. Followup CT abd/pelvis 06/03/19 showed persistent but improved inflammatory changes about the Appendix.  Preop labs reviewed, unremarkable.  EKG 04/01/19:  Sinus rhythm. Rate 95. Borderline prolonged PR interval. Low voltage, precordial leads. Probable anteroseptal infarct, old. When compared to tracing from 05/19/15, I do not appreciate any significant change.   Nuclear stress 07/13/15:  Nuclear stress EF: 57%.  Upsloping ST segment depression ST segment depression was noted during stress in the V4, V5 and V6 leads, beginning at 3 minutes of stress, and returning to baseline after less than 1 minute of recovery.  No T wave inversion was noted during stress.  Defect 1: There is a small defect of moderate severity present in the mid anterior and apical anterior location. This is consistent with breast attenuation artifact.  The study is normal.  This is a low risk study.  The left ventricular ejection fraction is normal (55-65%).   TTE 07/13/15: - Left ventricle: The cavity size was normal. Wall thickness was  normal. Systolic function was mildly to moderately reduced. The  estimated ejection fraction was in the range of 40% to 45%.  Diffuse hypokinesis. Doppler parameters are consistent with  abnormal left ventricular relaxation (grade 1 diastolic  dysfunction). Doppler parameters are consistent with high   ventricular filling pressure.  - Aortic valve: There was mild regurgitation.  - Mitral valve: There was mild regurgitation.  - Left atrium: The atrium was mildly dilated.   Impressions:   - Mild to moderate global reduction in LV function; grade 1  diastolic dysfunction with elevated LV filling pressure; mild AI;  mild LAE; mild MR.    30d event monitor 07/13/15:  Normal sinus rhythm and sinus tachycardia with heart rate between 90-130bpm.  No arrhythmias noted    Cassidy Harrell [facility]/Anesthesiology Phone [phone] 06/26/2019 9:24 AM

## 2019-06-30 ENCOUNTER — Other Ambulatory Visit: Payer: Self-pay

## 2019-06-30 ENCOUNTER — Encounter (HOSPITAL_COMMUNITY): Payer: Self-pay | Admitting: General Surgery

## 2019-06-30 ENCOUNTER — Ambulatory Visit (HOSPITAL_COMMUNITY): Payer: Medicare Other | Admitting: Physician Assistant

## 2019-06-30 ENCOUNTER — Ambulatory Visit (HOSPITAL_COMMUNITY)
Admission: RE | Admit: 2019-06-30 | Discharge: 2019-06-30 | Disposition: A | Discharge status: Home / Self Care | Payer: Medicare Other | Attending: General Surgery | Admitting: General Surgery

## 2019-06-30 ENCOUNTER — Encounter (HOSPITAL_COMMUNITY)
Admission: RE | Disposition: A | Discharge status: Home / Self Care | Payer: Self-pay | Source: Home / Self Care | Attending: General Surgery

## 2019-06-30 ENCOUNTER — Ambulatory Visit (HOSPITAL_COMMUNITY): Payer: Medicare Other | Admitting: Anesthesiology

## 2019-06-30 DIAGNOSIS — Z7982 Long term (current) use of aspirin: Secondary | ICD-10-CM | POA: Insufficient documentation

## 2019-06-30 DIAGNOSIS — G709 Myoneural disorder, unspecified: Secondary | ICD-10-CM | POA: Diagnosis not present

## 2019-06-30 DIAGNOSIS — I10 Essential (primary) hypertension: Secondary | ICD-10-CM | POA: Diagnosis not present

## 2019-06-30 DIAGNOSIS — Z791 Long term (current) use of non-steroidal anti-inflammatories (NSAID): Secondary | ICD-10-CM | POA: Insufficient documentation

## 2019-06-30 DIAGNOSIS — Z79899 Other long term (current) drug therapy: Secondary | ICD-10-CM | POA: Insufficient documentation

## 2019-06-30 DIAGNOSIS — K3532 Acute appendicitis with perforation and localized peritonitis, without abscess: Secondary | ICD-10-CM | POA: Diagnosis not present

## 2019-06-30 DIAGNOSIS — E119 Type 2 diabetes mellitus without complications: Secondary | ICD-10-CM | POA: Diagnosis not present

## 2019-06-30 DIAGNOSIS — Z888 Allergy status to other drugs, medicaments and biological substances status: Secondary | ICD-10-CM | POA: Insufficient documentation

## 2019-06-30 DIAGNOSIS — K3580 Unspecified acute appendicitis: Secondary | ICD-10-CM | POA: Diagnosis not present

## 2019-06-30 DIAGNOSIS — E039 Hypothyroidism, unspecified: Secondary | ICD-10-CM | POA: Diagnosis not present

## 2019-06-30 DIAGNOSIS — M1990 Unspecified osteoarthritis, unspecified site: Secondary | ICD-10-CM | POA: Insufficient documentation

## 2019-06-30 HISTORY — PX: LAPAROSCOPIC APPENDECTOMY: SHX408

## 2019-06-30 LAB — GLUCOSE, CAPILLARY
Glucose-Capillary: 127 mg/dL — ABNORMAL HIGH (ref 70–99)
Glucose-Capillary: 128 mg/dL — ABNORMAL HIGH (ref 70–99)

## 2019-06-30 SURGERY — APPENDECTOMY, LAPAROSCOPIC
Anesthesia: General | Site: Abdomen

## 2019-06-30 MED ORDER — ONDANSETRON HCL 4 MG/2ML IJ SOLN
INTRAMUSCULAR | Status: AC
  Filled 2019-06-30: qty 2

## 2019-06-30 MED ORDER — FENTANYL CITRATE (PF) 100 MCG/2ML IJ SOLN
25.0000 ug | INTRAMUSCULAR | Status: DC | PRN
  Administered 2019-06-30 (×2): 50 ug via INTRAVENOUS

## 2019-06-30 MED ORDER — OXYCODONE HCL 5 MG PO TABS: 5.0000 mg | ORAL_TABLET | Freq: Once | ORAL | Status: DC | PRN

## 2019-06-30 MED ORDER — ACETAMINOPHEN 325 MG PO TABS: 325.0000 mg | ORAL_TABLET | ORAL | Status: DC | PRN

## 2019-06-30 MED ORDER — SODIUM CHLORIDE 0.9 % IR SOLN
Status: DC | PRN
  Administered 2019-06-30: 1000 mL

## 2019-06-30 MED ORDER — TRAMADOL HCL 50 MG PO TABS: 50.0000 mg | ORAL_TABLET | Freq: Four times a day (QID) | ORAL | 0 refills | Status: DC | PRN

## 2019-06-30 MED ORDER — FENTANYL CITRATE (PF) 100 MCG/2ML IJ SOLN
INTRAMUSCULAR | Status: AC
  Filled 2019-06-30: qty 2

## 2019-06-30 MED ORDER — ROCURONIUM BROMIDE 10 MG/ML (PF) SYRINGE
PREFILLED_SYRINGE | INTRAVENOUS | Status: AC
  Filled 2019-06-30: qty 10

## 2019-06-30 MED ORDER — ACETAMINOPHEN 160 MG/5ML PO SOLN: 325.0000 mg | ORAL | Status: DC | PRN

## 2019-06-30 MED ORDER — LIDOCAINE 2% (20 MG/ML) 5 ML SYRINGE
INTRAMUSCULAR | Status: AC
  Filled 2019-06-30: qty 5

## 2019-06-30 MED ORDER — 0.9 % SODIUM CHLORIDE (POUR BTL) OPTIME
TOPICAL | Status: DC | PRN
  Administered 2019-06-30: 1000 mL

## 2019-06-30 MED ORDER — ROCURONIUM BROMIDE 10 MG/ML (PF) SYRINGE
PREFILLED_SYRINGE | INTRAVENOUS | Status: DC | PRN
  Administered 2019-06-30: 50 mg via INTRAVENOUS

## 2019-06-30 MED ORDER — LIDOCAINE 2% (20 MG/ML) 5 ML SYRINGE
INTRAMUSCULAR | Status: DC | PRN
  Administered 2019-06-30 (×2): 50 mg via INTRAVENOUS

## 2019-06-30 MED ORDER — DEXAMETHASONE SODIUM PHOSPHATE 10 MG/ML IJ SOLN
INTRAMUSCULAR | Status: AC
  Filled 2019-06-30: qty 1

## 2019-06-30 MED ORDER — CHLORHEXIDINE GLUCONATE CLOTH 2 % EX PADS: 6.0000 | MEDICATED_PAD | Freq: Once | CUTANEOUS | Status: DC

## 2019-06-30 MED ORDER — LACTATED RINGERS IV SOLN: INTRAVENOUS | Status: DC

## 2019-06-30 MED ORDER — SUGAMMADEX SODIUM 200 MG/2ML IV SOLN
INTRAVENOUS | Status: DC | PRN
  Administered 2019-06-30: 200 mg via INTRAVENOUS
  Administered 2019-06-30: 150 mg via INTRAVENOUS

## 2019-06-30 MED ORDER — CEFAZOLIN SODIUM-DEXTROSE 2-4 GM/100ML-% IV SOLN
2.0000 g | INTRAVENOUS | Status: AC
  Administered 2019-06-30: 2 g via INTRAVENOUS
  Filled 2019-06-30: qty 100

## 2019-06-30 MED ORDER — MIDAZOLAM HCL 2 MG/2ML IJ SOLN
INTRAMUSCULAR | Status: AC
  Filled 2019-06-30: qty 2

## 2019-06-30 MED ORDER — MEPERIDINE HCL 25 MG/ML IJ SOLN: 6.2500 mg | INTRAMUSCULAR | Status: DC | PRN

## 2019-06-30 MED ORDER — OXYCODONE HCL 5 MG/5ML PO SOLN: 5.0000 mg | Freq: Once | ORAL | Status: DC | PRN

## 2019-06-30 MED ORDER — ACETAMINOPHEN 500 MG PO TABS
1000.0000 mg | ORAL_TABLET | ORAL | Status: AC
  Administered 2019-06-30: 1000 mg via ORAL
  Filled 2019-06-30: qty 2

## 2019-06-30 MED ORDER — PROMETHAZINE HCL 25 MG/ML IJ SOLN
INTRAMUSCULAR | Status: AC
  Filled 2019-06-30: qty 1

## 2019-06-30 MED ORDER — PROPOFOL 10 MG/ML IV BOLUS
INTRAVENOUS | Status: DC | PRN
  Administered 2019-06-30: 10 mg via INTRAVENOUS
  Administered 2019-06-30: 120 mg via INTRAVENOUS
  Administered 2019-06-30: 20 mg via INTRAVENOUS

## 2019-06-30 MED ORDER — PROMETHAZINE HCL 25 MG/ML IJ SOLN
6.2500 mg | Freq: Once | INTRAMUSCULAR | Status: AC
  Administered 2019-06-30: 6.25 mg via INTRAVENOUS

## 2019-06-30 MED ORDER — ONDANSETRON HCL 4 MG/2ML IJ SOLN
INTRAMUSCULAR | Status: DC | PRN
  Administered 2019-06-30: 4 mg via INTRAVENOUS

## 2019-06-30 MED ORDER — FENTANYL CITRATE (PF) 250 MCG/5ML IJ SOLN
INTRAMUSCULAR | Status: AC
  Filled 2019-06-30: qty 5

## 2019-06-30 MED ORDER — FENTANYL CITRATE (PF) 250 MCG/5ML IJ SOLN
INTRAMUSCULAR | Status: DC | PRN
  Administered 2019-06-30: 50 ug via INTRAVENOUS
  Administered 2019-06-30: 100 ug via INTRAVENOUS
  Administered 2019-06-30 (×2): 50 ug via INTRAVENOUS

## 2019-06-30 MED ORDER — ONDANSETRON HCL 4 MG/2ML IJ SOLN
4.0000 mg | Freq: Once | INTRAMUSCULAR | Status: AC | PRN
  Administered 2019-06-30: 4 mg via INTRAVENOUS

## 2019-06-30 MED ORDER — MIDAZOLAM HCL 2 MG/2ML IJ SOLN
INTRAMUSCULAR | Status: DC | PRN
  Administered 2019-06-30: 2 mg via INTRAVENOUS

## 2019-06-30 MED ORDER — STERILE WATER FOR IRRIGATION IR SOLN
Status: DC | PRN
  Administered 2019-06-30: 1000 mL

## 2019-06-30 MED ORDER — CELECOXIB 200 MG PO CAPS
200.0000 mg | ORAL_CAPSULE | ORAL | Status: AC
  Administered 2019-06-30: 200 mg via ORAL
  Filled 2019-06-30: qty 1

## 2019-06-30 MED ORDER — BUPIVACAINE HCL (PF) 0.25 % IJ SOLN
INTRAMUSCULAR | Status: AC
  Filled 2019-06-30: qty 30

## 2019-06-30 MED ORDER — PROPOFOL 10 MG/ML IV BOLUS
INTRAVENOUS | Status: AC
  Filled 2019-06-30: qty 20

## 2019-06-30 MED ORDER — BUPIVACAINE HCL 0.25 % IJ SOLN
INTRAMUSCULAR | Status: DC | PRN
  Administered 2019-06-30: 7 mL

## 2019-06-30 MED ORDER — DEXAMETHASONE SODIUM PHOSPHATE 10 MG/ML IJ SOLN
INTRAMUSCULAR | Status: DC | PRN
  Administered 2019-06-30: 4 mg via INTRAVENOUS

## 2019-06-30 SURGICAL SUPPLY — 44 items
ADH SKN CLS APL DERMABOND .7 (GAUZE/BANDAGES/DRESSINGS) ×1
APL PRP STRL LF DISP 70% ISPRP (MISCELLANEOUS) ×1
APPLIER CLIP 5 13 M/L LIGAMAX5 (MISCELLANEOUS)
APR CLP MED LRG 5 ANG JAW (MISCELLANEOUS)
BLADE CLIPPER SURG (BLADE) IMPLANT
CANISTER SUCT 3000ML PPV (MISCELLANEOUS) ×3 IMPLANT
CHLORAPREP W/TINT 26 (MISCELLANEOUS) ×3 IMPLANT
CLIP APPLIE 5 13 M/L LIGAMAX5 (MISCELLANEOUS) IMPLANT
CLIP VESOLOCK XL 6/CT (CLIP) ×3 IMPLANT
COVER SURGICAL LIGHT HANDLE (MISCELLANEOUS) ×3 IMPLANT
COVER TRANSDUCER ULTRASND (DRAPES) ×3 IMPLANT
COVER WAND RF STERILE (DRAPES) ×3 IMPLANT
DERMABOND ADVANCED (GAUZE/BANDAGES/DRESSINGS) ×2
DERMABOND ADVANCED .7 DNX12 (GAUZE/BANDAGES/DRESSINGS) ×1 IMPLANT
ELECT REM PT RETURN 9FT ADLT (ELECTROSURGICAL) ×3
ELECTRODE REM PT RTRN 9FT ADLT (ELECTROSURGICAL) ×1 IMPLANT
ENDOLOOP SUT PDS II  0 18 (SUTURE)
ENDOLOOP SUT PDS II 0 18 (SUTURE) IMPLANT
GLOVE BIO SURGEON STRL SZ7.5 (GLOVE) ×3 IMPLANT
GOWN STRL REUS W/ TWL LRG LVL3 (GOWN DISPOSABLE) ×2 IMPLANT
GOWN STRL REUS W/ TWL XL LVL3 (GOWN DISPOSABLE) ×1 IMPLANT
GOWN STRL REUS W/TWL LRG LVL3 (GOWN DISPOSABLE) ×6
GOWN STRL REUS W/TWL XL LVL3 (GOWN DISPOSABLE) ×3
GRASPER SUT TROCAR 14GX15 (MISCELLANEOUS) ×3 IMPLANT
KIT BASIN OR (CUSTOM PROCEDURE TRAY) ×3 IMPLANT
KIT TURNOVER KIT B (KITS) ×3 IMPLANT
NDL INSUFFLATION 14GA 120MM (NEEDLE) ×1 IMPLANT
NEEDLE INSUFFLATION 14GA 120MM (NEEDLE) ×3 IMPLANT
NS IRRIG 1000ML POUR BTL (IV SOLUTION) ×3 IMPLANT
PAD ARMBOARD 7.5X6 YLW CONV (MISCELLANEOUS) ×6 IMPLANT
SCISSORS LAP 5X35 DISP (ENDOMECHANICALS) ×3 IMPLANT
SET IRRIG TUBING LAPAROSCOPIC (IRRIGATION / IRRIGATOR) ×3 IMPLANT
SET TUBE SMOKE EVAC HIGH FLOW (TUBING) ×3 IMPLANT
SLEEVE ENDOPATH XCEL 5M (ENDOMECHANICALS) ×3 IMPLANT
SPECIMEN JAR SMALL (MISCELLANEOUS) ×3 IMPLANT
SUT MNCRL AB 4-0 PS2 18 (SUTURE) ×3 IMPLANT
TOWEL GREEN STERILE (TOWEL DISPOSABLE) ×3 IMPLANT
TOWEL GREEN STERILE FF (TOWEL DISPOSABLE) ×3 IMPLANT
TRAY FOLEY W/BAG SLVR 16FR (SET/KITS/TRAYS/PACK)
TRAY FOLEY W/BAG SLVR 16FR ST (SET/KITS/TRAYS/PACK) ×1 IMPLANT
TRAY LAPAROSCOPIC MC (CUSTOM PROCEDURE TRAY) ×3 IMPLANT
TROCAR XCEL NON-BLD 11X100MML (ENDOMECHANICALS) ×3 IMPLANT
TROCAR XCEL NON-BLD 5MMX100MML (ENDOMECHANICALS) ×3 IMPLANT
WATER STERILE IRR 1000ML POUR (IV SOLUTION) ×3 IMPLANT

## 2019-06-30 NOTE — Anesthesia Postprocedure Evaluation (Signed)
Anesthesia Post Note  Patient: Cassidy Harrell  Procedure(s) Performed: APPENDECTOMY LAPAROSCOPIC (N/A Abdomen)     Patient location during evaluation: PACU Anesthesia Type: General Level of consciousness: awake and alert Pain management: pain level controlled Vital Signs Assessment: post-procedure vital signs reviewed and stable Respiratory status: spontaneous breathing, nonlabored ventilation, respiratory function stable and patient connected to nasal cannula oxygen Cardiovascular status: blood pressure returned to baseline and stable Postop Assessment: no apparent nausea or vomiting Anesthetic complications: no    Last Vitals:  Vitals:   06/30/19 1037 06/30/19 1052  BP: (!) 164/77 139/67  Pulse: 73 73  Resp: 19 (!) 22  Temp:    SpO2: 100% 100%    Last Pain:  Vitals:   06/30/19 1037  TempSrc:   PainSc: 5                  Rc Amison

## 2019-06-30 NOTE — Interval H&P Note (Signed)
History and Physical Interval Note:  06/30/2019 8:38 AM  [name]  has presented today for surgery, with the diagnosis of HISTORY OF PERFORATED APPENDICITIS.  The various methods of treatment have been discussed with the patient and family. After consideration of risks, benefits and other options for treatment, the patient has consented to  Procedure(s): APPENDECTOMY LAPAROSCOPIC (N/A) as a surgical intervention.  The patient's history has been reviewed, patient examined, no change in status, stable for surgery.  I have reviewed the patient's chart and labs.  Questions were answered to the patient's satisfaction.     Ralene Ok

## 2019-06-30 NOTE — Op Note (Signed)
06/30/2019  9:48 AM  PATIENT:  Cassidy Harrell  69 y.o. female  PRE-OPERATIVE DIAGNOSIS:  HISTORY OF PERFORATED APPENDICITIS  POST-OPERATIVE DIAGNOSIS:  HISTORY OF PERFORATED APPENDICITIS  PROCEDURE:  Procedure(s): APPENDECTOMY LAPAROSCOPIC (N/A)  SURGEON:  Surgeon(s) and Role:    * Ralene Ok, MD - Primary  ANESTHESIA:   local and general  EBL:  minimal   BLOOD ADMINISTERED:none  DRAINS: none   LOCAL MEDICATIONS USED:  BUPIVICAINE   SPECIMEN:  Source of Specimen:  appendix  DISPOSITION OF SPECIMEN:  PATHOLOGY  COUNTS:  YES  TOURNIQUET:  * No tourniquets in log *  DICTATION: .Dragon Dictation Complications: none  Counts: reported as correct x 2  Findings:  Thin adhesions, non inflamed gallbladder  Specimen: Appendix  Indications for procedure:  The patient had an acutely inflamed appendix and perforation with drain placement.  The drain was removed and f/u CT showed no inflammation or abscess  Details of the procedure:The patient was taken back to the operating room. The patient was placed in supine position with bilateral SCDs in place.  A foley catheter was place. The patient was prepped and draped in the usual sterile fashion.  After appropriate anitbiotics were confirmed, a time-out was confirmed and all facts were verified.    A pneumoperitoneum of 14 mmHg was obtained via a Veress needle technique in the left lower quadrant quadrant.  A 5 mm trocar and 5 mm camera then placed intra-abdominally there is no injury to any intra-abdominal organs a 10 mm infraumbilical port was placed and direct visualization as was a 5 mm port in the suprapubic area.   The appendix was identified and seen to be non-perforated, and had some thin adhesions to the lateral abdominal wall.  The appendix was cleaned down to the appendiceal base. The mesoappendix was then incised and the appendiceal artery was cauterized.  The the appendiceal base was clean.  A gold hemoclip was  placed proximallyx2 and one distally and the appendix was transected between these 2. A retrieval bag was then placed into the abdomen and the specimen placed in the bag. The appendiceal stump was cauterized. We evacuate the fluid from the pelvis until the effluent was clear.  The appendix and retrieval  bag was then retrieved via the supraumbilical port. #1 Vicryl was used to reapproximate the fascia at the umbilical port site x2. The skin was reapproximated all port sites 3-0 Monocryl subcuticular fashion. The skin was dressed with Dermabond.  The patient had the foley removed. The patient was awakened from general anesthesia was taken to recovery room in stable condition.      PLAN OF CARE: Discharge to home after PACU  PATIENT DISPOSITION:  PACU - hemodynamically stable.   Delay start of Pharmacological VTE agent (>24hrs) due to surgical blood loss or risk of bleeding: not applicable

## 2019-06-30 NOTE — Transfer of Care (Signed)
Immediate Anesthesia Transfer of Care Note  Patient: Cassidy Harrell  Procedure(s) Performed: APPENDECTOMY LAPAROSCOPIC (N/A Abdomen)  Patient Location: PACU  Anesthesia Type:General  Level of Consciousness: awake, patient cooperative and responds to stimulation  Airway & Oxygen Therapy: Patient Spontanous Breathing, Patient connected to nasal cannula oxygen and Patient connected to face mask oxygen  Post-op Assessment: Report given to RN, Post -op Vital signs reviewed and stable and Patient moving all extremities X 4  Post vital signs: Reviewed and stable  Last Vitals:  Vitals Value Taken Time  BP 156/90 06/30/19 1009  Temp    Pulse 82 06/30/19 1013  Resp 43 06/30/19 1013  SpO2 93 % 06/30/19 1013  Vitals shown include unvalidated device data.  Last Pain:  Vitals:   06/30/19 0754  TempSrc:   PainSc: 0-No pain         Complications: No apparent anesthesia complications

## 2019-06-30 NOTE — Discharge Instructions (Signed)
Laparoscopic Appendectomy, Adult, Care After This sheet gives you information about how to care for yourself after your procedure. Your doctor may also give you more specific instructions. If you have problems or questions, contact your doctor. What can I expect after the procedure? After the procedure, it is common to have:  Little energy for normal activities.  Mild pain in the area where the cuts from surgery (incisions) were made.  Trouble pooping (constipation). This can be caused by: ? Pain medicine. ? A lack of activity. Follow these instructions at home: Medicines  Take over-the-counter and prescription medicines only as told by your doctor.  If you were prescribed an antibiotic medicine, take it as told by your doctor. Do not stop taking it even if you start to feel better.  Do not drive or use heavy machinery while taking prescription pain medicine.  Ask your doctor if the medicine you are taking can cause trouble pooping. You may need to take steps to prevent or treat trouble pooping: ? Drink enough fluid to keep your pee (urine) pale yellow. ? Take over-the-counter or prescription medicines. ? Eat foods that are high in fiber. These include beans, whole grains, and fresh fruits and vegetables. ? Limit foods that are high in fat and sugar. These include fried or sweet foods. Incision care   Follow instructions from your doctor about how to take care of your cuts from surgery. Make sure you: ? Wash your hands with soap and water before and after you change your bandage (dressing). If you cannot use soap and water, use hand sanitizer. ? Change your bandage as told by your doctor. ? Leave stitches (sutures), skin glue, or skin tape (adhesive) strips in place. They may need to stay in place for 2 weeks or longer. If tape strips get loose and curl up, you may trim the loose edges. Do not remove tape strips completely unless your doctor says it is okay.  Check your cuts from  surgery every day for signs of infection. Check for: ? Redness, swelling, or pain. ? Fluid or blood. ? Warmth. ? Pus or a bad smell. Bathing  Keep your cuts from surgery clean and dry. Clean them as told by your doctor. To do this: 1. Gently wash the cuts with soap and water. 2. Rinse the cuts with water to remove all soap. 3. Pat the cuts dry with a clean towel. Do not rub the cuts.  Do not take baths, swim, or use a hot tub for 2 weeks, or until your doctor says it is okay. You may take showers after 48 hours. Activity   Do not drive for 24 hours if you were given a medicine to help you relax (sedative) during your procedure.  Rest after the procedure. Return to your normal activities as told by your doctor. Ask your doctor what activities are safe for you.  For 3 weeks, or for as long as told by your doctor: ? Do not lift anything that is heavier than 10 lb (4.5 kg), or the limit that you are told. ? Do not play contact sports. General instructions  If you were sent home with a drain, follow instructions from your doctor on how to care for it.  Take deep breaths. This helps to keep your lungs from getting an infection (pneumonia).  Keep all follow-up visits as told by your doctor. This is important. Contact a doctor if:  You have redness, swelling, or pain around a cut from surgery.    You have fluid or blood coming from a cut.  Your cut feels warm to the touch.  You have pus or a bad smell coming from a cut or a bandage.  The edges of a cut break open after the stitches have been taken out.  You have pain in your shoulders that gets worse.  You feel dizzy or you pass out (faint).  You have shortness of breath.  You keep feeling sick to your stomach (nauseous).  You keep throwing up (vomiting).  You get watery poop (diarrhea) or you cannot control your poop.  You lose your appetite.  You have swelling or pain in your legs.  You get a rash. Get help right  away if:  You have a fever.  You have trouble breathing.  You have sharp pains in your chest. Summary  After the procedure, it is common to have low energy, mild pain, and trouble pooping.  Infection is a common problem after this procedure. Follow your doctor's instructions about caring for yourself after the procedure.  Rest after the procedure. Return to your normal activities as told by your doctor.  Contact your doctor if you see signs of infection around your cuts from surgery, or you get short of breath. Get help right away if you have a fever, chest pain, or trouble breathing. This information is not intended to replace advice given to you by your health care provider. Make sure you discuss any questions you have with your health care provider. Document Revised: 08/16/2017 Document Reviewed: 08/16/2017 [employer] Patient Education  2020 [employer]  

## 2019-07-01 LAB — SURGICAL PATHOLOGY

## 2019-07-12 NOTE — Patient Instructions (Signed)
Visit Information  Goals Addressed            This Visit's Progress   . Pharmacy Care Plan       CARE PLAN ENTRY  Current Barriers:  . Chronic Disease Management support, education, and care coordination needs related to Diabetes, Hypertension, Pain   Hypertension . Pharmacist Clinical Goal(s): o Over the next 90 days, patient will work with PharmD and providers to achieve BP goal <140/90 . Current regimen:  o Amlodipine 5mg  daily, lisinopril 40mg  daily, metoprolol succinate 25mg  daily . Interventions: o Requested patient to check blood pressure 3 times per week and record . Patient self care activities - Over the next 90 days, patient will: o Check BP three times weekly, document, and provide at future appointments o Ensure daily salt intake < 2300 mg/Hisao Doo  Diabetes . Pharmacist Clinical Goal(s): o Over the next 90 days, patient will work with PharmD and providers to achieve A1c goal <7% . Current regimen:  o Diet and exercise management   . Interventions: o Requested patient to reduce ice cream intake to 3 days a week versus every Rykar Lebleu o Requested patient to check blood sugar 3 times per week and record . Patient self care activities - Over the next 90 days, patient will: o Check blood sugar three times per week document, and provide at future appointments o Contact provider with any episodes of hypoglycemia  Pain . Pharmacist Clinical Goal(s) o Over the next 90 days, patient will work with PharmD and providers to reduce patient's symptoms of pain . Current regimen:  o Meloxicam 7.5mg  daily, biofreeze as needed, aspercreme as needed . Interventions: o Consider increasing meloxicam to 15mg  daily to help with pain . Patient self care activities - Over the next 90 days, patient will: o Maintain pain medication regimen  Medication management . Pharmacist Clinical Goal(s): o Over the next 90 days, patient will work with PharmD and providers to maintain optimal medication  adherence . Current pharmacy: [facility] . Interventions o Comprehensive medication review performed. o Continue current medication management strategy . Patient self care activities - Over the next 90 days, patient will: o Focus on medication adherence by filling medications appropriately  o Take medications as prescribed o Report any questions or concerns to PharmD and/or provider(s)  Initial goal documentation        Cassidy Harrell was given information about Chronic Care Management services today including:  1. CCM service includes personalized support from designated clinical staff supervised by her physician, including individualized plan of care and coordination with other care providers 2. 24/7 contact phone numbers for assistance for urgent and routine care needs. 3. Standard insurance, coinsurance, copays and deductibles apply for chronic care management only during months in which we provide at least 20 minutes of these services. Most insurances cover these services at 100%, however patients may be responsible for any copay, coinsurance and/or deductible if applicable. This service may help you avoid the need for more expensive face-to-face services. 4. Only one practitioner may furnish and bill the service in a calendar month. 5. The patient may stop CCM services at any time (effective at the end of the month) by phone call to the office staff.  Patient agreed to services and verbal consent obtained.   The patient verbalized understanding of instructions provided today and agreed to receive a mailed copy of patient instruction and/or educational materials. Telephone follow up appointment with pharmacy team member scheduled for: 07/23/2019  Melvenia Beam Haley Fuerstenberg,  PharmD Clinical Pharmacist [facility] [phone]    How to Take Your Blood Pressure You can take your blood pressure at home with a machine. You may need to check your blood pressure  at home:  To check if you have high blood pressure (hypertension).  To check your blood pressure over time.  To make sure your blood pressure medicine is working. Supplies needed: You will need a blood pressure machine, or monitor. You can buy one at a drugstore or online. When choosing one:  Choose one with an arm cuff.  Choose one that wraps around your upper arm. Only one finger should fit between your arm and the cuff.  Do not choose one that measures your blood pressure from your wrist or finger. Your doctor can suggest a monitor. How to prepare Avoid these things for 30 minutes before checking your blood pressure:  Drinking caffeine.  Drinking alcohol.  Eating.  Smoking.  Exercising. Five minutes before checking your blood pressure:  Pee.  Sit in a dining chair. Avoid sitting in a soft couch or armchair.  Be quiet. Do not talk. How to take your blood pressure Follow the instructions that came with your machine. If you have a digital blood pressure monitor, these may be the instructions: 1. Sit up straight. 2. Place your feet on the floor. Do not cross your ankles or legs. 3. Rest your left arm at the level of your heart. You may rest it on a table, desk, or chair. 4. Pull up your shirt sleeve. 5. Wrap the blood pressure cuff around the upper part of your left arm. The cuff should be 1 inch (2.5 cm) above your elbow. It is best to wrap the cuff around bare skin. 6. Fit the cuff snugly around your arm. You should be able to place only one finger between the cuff and your arm. 7. Put the cord inside the groove of your elbow. 8. Press the power button. 9. Sit quietly while the cuff fills with air and loses air. 10. Write down the numbers on the screen. 11. Wait 2-3 minutes and then repeat steps 1-10. What do the numbers mean? Two numbers make up your blood pressure. The first number is called systolic pressure. The second is called diastolic pressure. An example of  a blood pressure reading is "120 over 80" (or 120/80). If you are an adult and do not have a medical condition, use this guide to find out if your blood pressure is normal: Normal  First number: below 120.  Second number: below 80. Elevated  First number: 120-129.  Second number: below 80. Hypertension stage 1  First number: 130-139.  Second number: 80-89. Hypertension stage 2  First number: 140 or above.  Second number: 63 or above. Your blood pressure is above normal even if only the top or bottom number is above normal. Follow these instructions at home:  Check your blood pressure as often as your doctor tells you to.  Take your monitor to your next doctor's appointment. Your doctor will: ? Make sure you are using it correctly. ? Make sure it is working right.  Make sure you understand what your blood pressure numbers should be.  Tell your doctor if your medicines are causing side effects. Contact a doctor if:  Your blood pressure keeps being high. Get help right away if:  Your first blood pressure number is higher than 180.  Your second blood pressure number is higher than  120. This information is not intended to replace advice given to you by your health care provider. Make sure you discuss any questions you have with your health care provider. Document Revised: 01/26/2017 Document Reviewed: 07/23/2015 Elsevier Patient Education  2020 [place].  Blood Pressure Record Sheet To take your blood pressure, you will need a blood pressure machine. You can buy a blood pressure machine (blood pressure monitor) at your clinic, drug store, or online. When choosing one, consider:  An automatic monitor that has an arm cuff.  A cuff that wraps snugly around your upper arm. You should be able to fit only one finger between your arm and the cuff.  A device that stores blood pressure reading results.  Do not choose a monitor that measures your blood pressure from your  wrist or finger. Follow your health care provider's instructions for how to take your blood pressure. To use this form:  Get one reading in the morning (a.m.) before you take any medicines.  Get one reading in the evening (p.m.) before supper.  Take at least 2 readings with each blood pressure check. This makes sure the results are correct. Wait 1-2 minutes between measurements.  Write down the results in the spaces on this form.  Repeat this once a week, or as told by your health care provider.  Make a follow-up appointment with your health care provider to discuss the results. Blood pressure log Date: _______________________  a.m. _____________________(1st reading) _____________________(2nd reading)  p.m. _____________________(1st reading) _____________________(2nd reading) Date: _______________________  a.m. _____________________(1st reading) _____________________(2nd reading)  p.m. _____________________(1st reading) _____________________(2nd reading) Date: _______________________  a.m. _____________________(1st reading) _____________________(2nd reading)  p.m. _____________________(1st reading) _____________________(2nd reading) Date: _______________________  a.m. _____________________(1st reading) _____________________(2nd reading)  p.m. _____________________(1st reading) _____________________(2nd reading) Date: _______________________  a.m. _____________________(1st reading) _____________________(2nd reading)  p.m. _____________________(1st reading) _____________________(2nd reading) This information is not intended to replace advice given to you by your health care provider. Make sure you discuss any questions you have with your health care provider. Document Revised: 04/13/2017 Document Reviewed: 02/13/2017 Elsevier Patient Education  [place].

## 2019-07-23 ENCOUNTER — Ambulatory Visit: Payer: Medicare Other | Admitting: Pharmacist

## 2019-07-23 ENCOUNTER — Other Ambulatory Visit: Payer: Self-pay

## 2019-07-23 ENCOUNTER — Ambulatory Visit: Payer: Self-pay | Admitting: Pharmacist

## 2019-07-23 DIAGNOSIS — E119 Type 2 diabetes mellitus without complications: Secondary | ICD-10-CM

## 2019-07-23 DIAGNOSIS — I10 Essential (primary) hypertension: Secondary | ICD-10-CM

## 2019-07-23 NOTE — Chronic Care Management (AMB) (Signed)
  Chronic Care Management   Outreach Note  07/23/2019 Name: Cassidy Harrell MRN: [id number] DOB: [birth year]  Referred by: [name], NP Reason for referral : No chief complaint on file.   An unsuccessful telephone outreach was attempted today. The patient was referred to the pharmacist for assistance with care management and care coordination.   Follow Up Plan:  -Will try to call patient back in 10 mins. If no answer will try to contact patient next month.   De Blanch, PharmD Clinical Pharmacist [facility] at [facility] [phone]

## 2019-07-23 NOTE — Chronic Care Management (AMB) (Signed)
Chronic Care Management Pharmacy  Name: Cassidy Harrell  MRN: [id number] DOB: 21-May-1950   Chief Complaint/ HPI  Cassidy Harrell,  69 y.o. , female presents for their Initial CCM visit with the clinical pharmacist via telephone due to COVID-19 Pandemic.  PCP : [name], NP  Their chronic conditions include: Diabetes, Hypertension, Pain  Office Visits: None since last CCM visit on 06/24/19.    Consult Visit: None in last 6 months  Surgery:  06/30/19: appendectomy   Medications: Outpatient Encounter Medications as of 07/23/2019  Medication Sig  . acetaminophen (TYLENOL) 650 MG CR tablet Take 650 mg by mouth every 8 (eight) hours as needed for pain.  Marland Kitchen amLODipine (NORVASC) 5 MG tablet TAKE 1 TABLET BY MOUTH  DAILY (Patient taking differently: Take 5 mg by mouth daily with lunch. )  . aspirin EC 81 MG tablet Take 81 mg by mouth daily with lunch.  . blood glucose meter kit and supplies KIT Dispense based on patient and insurance preference. Use up to four times daily as directed. (FOR ICD-9 250.00, 250.01).  . calcium carbonate (OSCAL) 1500 (600 Ca) MG TABS tablet Take 1,200 mg by mouth daily with lunch. 800 units vitamin D  . ferrous sulfate 325 (65 FE) MG EC tablet Take 325 mg by mouth daily with lunch.  . lisinopril (ZESTRIL) 40 MG tablet TAKE 1 TABLET BY MOUTH  DAILY (Patient taking differently: Take 40 mg by mouth daily with lunch. )  . meloxicam (MOBIC) 7.5 MG tablet Take 1 tablet (7.5 mg total) by mouth daily. (Patient taking differently: Take 7.5 mg by mouth daily with lunch. )  . Menthol, Topical Analgesic, (BIOFREEZE EX) Apply 1 application topically 3 (three) times daily as needed (pain.).  Marland Kitchen metoprolol succinate (TOPROL-XL) 25 MG 24 hr tablet TAKE 1 TABLET BY MOUTH  DAILY (Patient taking differently: Take 25 mg by mouth daily with lunch. )  . ondansetron (ZOFRAN) 4 MG tablet Take 1 tablet (4 mg total) by mouth every 8 (eight) hours as needed for nausea or  vomiting.  . traMADol (ULTRAM) 50 MG tablet Take 1 tablet (50 mg total) by mouth every 6 (six) hours as needed.   No facility-administered encounter medications on file as of 07/23/2019.   SDOH Screenings   Alcohol Screen:   . Last Alcohol Screening Score (AUDIT):   Depression (PHQ2-9):   . PHQ-2 Score:   Financial Resource Strain: Low Risk   . Difficulty of Paying Living Expenses: Not very hard  Food Insecurity:   . Worried About Charity fundraiser in the Last Year:   . [place] in the Last Year:   Housing:   . Last Housing Risk Score:   Physical Activity:   . Days of Exercise per Week:   . Minutes of Exercise per Session:   Social Connections:   . Frequency of Communication with Friends and Family:   . Frequency of Social Gatherings with Friends and Family:   . Attends Religious Services:   . Active Member of Clubs or Organizations:   . Attends Archivist Meetings:   Marland Kitchen Marital Status:   Stress:   . Feeling of Stress :   Tobacco Use: Medium Risk  . Smoking Tobacco Use: Former Smoker  . Smokeless Tobacco Use: Never Used  Transportation Needs:   . Film/video editor (Medical):   Marland Kitchen Lack of Transportation (Non-Medical):      Current Diagnosis/Assessment:  Goals Addressed  This Visit's Progress   . Pharmacy Care Plan   On track    CARE PLAN ENTRY  Current Barriers:  . Chronic Disease Management support, education, and care coordination needs related to Diabetes, Hypertension, Pain   Hypertension . Pharmacist Clinical Goal(s): o Over the next 90 days, patient will work with PharmD and providers to maintain BP goal <140/90 (BP improved since last CCM assessment) o Home BP Avg: 135/71 . Current regimen:  o Amlodipine 7m daily, lisinopril 431mdaily, metoprolol succinate 2539maily . Interventions: o Requested patient to check blood pressure 3 times per week and record . Patient self care activities - Over the next 90 days, patient  will: o Check BP three times weekly, document, and provide at future appointments o Ensure daily salt intake < 2300 mg/day  Diabetes . Pharmacist Clinical Goal(s): o Over the next 90 days, patient will work with PharmD and providers to maintain A1c goal <7% (a1c improved since last CCM assessment) o 11/05/18: a1c = 7.1% vs 06/25/19: a1c = 6.2% . Current regimen:  o Diet and exercise management   . Interventions: o Requested patient to reduce ice cream intake to 3 days a week versus every day o Requested patient to check blood sugar 3 times per week and record . Patient self care activities - Over the next 90 days, patient will: o Check blood sugar three times per week document, and provide at future appointments o Continue limiting ice cream to 3 times per week or less o Contact provider with any episodes of hypoglycemia  Pain . Pharmacist Clinical Goal(s) o Over the next 90 days, patient will work with PharmD and providers to reduce patient's symptoms of pain . Current regimen:  o Meloxicam 7.5mg29mily, biofreeze as needed, aspercreme as needed . Interventions: o Consider increasing meloxicam to 15mg52mly to help with pain . Patient self care activities - Over the next 90 days, patient will: o Maintain pain medication regimen  Medication management . Pharmacist Clinical Goal(s): o Over the next 90 days, patient will work with PharmD and providers to maintain optimal medication adherence . [facility] o Comprehensive medication review performed. o Continue current medication management strategy . Patient self care activities - Over the next 90 days, patient will: o Focus on medication adherence by filling medications appropriately  o Take medications as prescribed o Report any questions or concerns to PharmD and/or provider(s)  Please see past updates related to this goal by clicking on the "Past Updates" button in the selected goal          Social Hx:  From [place] [employer] one son age 98. W85ks at the [employer]aughters age 72,1616,[place] in gymnastics Divorced since 1996.  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  Diabetes   Recent Relevant Labs: Lab Results  Component Value Date/Time   HGBA1C 6.2 (H) 06/25/2019 01:35 PM   HGBA1C 7.1 (H) 11/05/2018 02:36 PM   MICROALBUR 0.7 09/16/2014 02:32 PM   MICROALBUR 0.8 11/24/2013 08:39 AM    A1c goal <7% Fasting 80-130 Post prandial <180  Checking BG: Weekly    Patient has failed these meds in past: None noted  Patient is currently controlled on the following medications: None  Last diabetic Eye exam:  Lab Results  Component Value Date/Time   HMDIABEYEEXA No Retinopathy 03/26/2018 12:00 AM    Last diabetic Foot exam: No results  found for: HMDIABFOOTEX    From 06/24/19 CCM Visit Diet:  Chicken, fish, [place] Vegetables: Zucchini, squash, broccoli, potatoes Pinto beans Weakness is ice cream and cookies. Eats ice cream daily. Serving size cup to a bowl. May have klondike bar or ice cream sandwich.   We discussed: diet and exercise extensively   Update 07/23/19 Doesn't think she has had any ice cream in the last week. Has really stuck to limiting ice cream to 3 times per week  Recent FBG Readings: 134 133 109 126 Average 125.5  Congratulated patient on a1c decrease!   Plan -Continue to to limit ice cream intake to 3 days a week versus every Stellarose Cerny -Continue to check blood sugar 3 times per week and record -Continue control with diet and exercise   Hypertension   CMP Latest Ref Rng & Units 06/25/2019 04/05/2019 04/04/2019  Glucose 70 - 99 mg/dL 104(H) 138(H) 167(H)  BUN 8 - 23 mg/dL 10 <5(L) <5(L)  Creatinine 0.44 - 1.00 mg/dL 0.69 0.67 0.76  Sodium 135 - 145 mmol/L 143 141 143  Potassium 3.5 - 5.1 mmol/L 3.8 3.8 3.4(L)  Chloride 98 - 111 mmol/L 108 107 107  CO2 22 - 32 mmol/L _0 Calcium 8.9 - 10.3  mg/dL 9.3 8.9 8.4(L)  Total Protein 6.5 - 8.1 g/dL - - -  Total Bilirubin 0.3 - 1.2 mg/dL - - -  Alkaline Phos 38 - 126 U/L - - -  AST 15 - 41 U/L - - -  ALT 0 - 44 U/L - - -    BP today is: Unable to assess due to phone visit   Office blood pressures are  BP Readings from Last 3 Encounters:  06/30/19 140/72  06/25/19 140/72  05/13/19 (!) 142/70   Blood pressure goal <140/90  Patient has failed these meds in the past: spironolactone (itching), diltiazem (d/c when pt had AV heart block?) Patient is currently controlled on the following medications: amlodipine 70m daily, lisinopril 418mdaily, metoprolol succinate 2528maily  Patient checks BP at home 1-2x per week  Patient home BP readings are ranging:  123 62 136 76 145 75 137 70 132 73 Average 134.6 71.2  We discussed Importance of med adherence. Proper blood pressure measurement technique   Plan -Continue to check blood pressure 3 times per week and record -Continue current medications    Meds to D/C from list Ondansetron

## 2019-07-23 NOTE — Patient Instructions (Signed)
Visit Information  Goals Addressed            This Visit's Progress   . Pharmacy Care Plan   On track    CARE PLAN ENTRY  Current Barriers:  . Chronic Disease Management support, education, and care coordination needs related to Diabetes, Hypertension, Pain   Hypertension . Pharmacist Clinical Goal(s): o Over the next 90 days, patient will work with PharmD and providers to maintain BP goal <140/90 (BP improved since last CCM assessment) o Home BP Avg: 135/71 . Current regimen:  o Amlodipine 5mg  daily, lisinopril 40mg  daily, metoprolol succinate 25mg  daily . Interventions: o Requested patient to check blood pressure 3 times per week and record . Patient self care activities - Over the next 90 days, patient will: o Check BP three times weekly, document, and provide at future appointments o Ensure daily salt intake < 2300 mg/Cassidy Harrell  Diabetes . Pharmacist Clinical Goal(s): o Over the next 90 days, patient will work with PharmD and providers to maintain A1c goal <7% (a1c improved since last CCM assessment) o 11/05/18: a1c = 7.1% vs 06/25/19: a1c = 6.2% . Current regimen:  o Diet and exercise management   . Interventions: o Requested patient to reduce ice cream intake to 3 days a week versus every Cassidy Harrell o Requested patient to check blood sugar 3 times per week and record . Patient self care activities - Over the next 90 days, patient will: o Check blood sugar three times per week document, and provide at future appointments o Continue limiting ice cream to 3 times per week or less o Contact provider with any episodes of hypoglycemia  Pain . Pharmacist Clinical Goal(s) o Over the next 90 days, patient will work with PharmD and providers to reduce patient's symptoms of pain . Current regimen:  o Meloxicam 7.5mg  daily, biofreeze as needed, aspercreme as needed . Interventions: o Consider increasing meloxicam to 15mg  daily to help with pain . Patient self care activities - Over the next  90 days, patient will: o Maintain pain medication regimen  Medication management . Pharmacist Clinical Goal(s): o Over the next 90 days, patient will work with PharmD and providers to maintain optimal medication adherence . Current pharmacy: [facility] . Interventions o Comprehensive medication review performed. o Continue current medication management strategy . Patient self care activities - Over the next 90 days, patient will: o Focus on medication adherence by filling medications appropriately  o Take medications as prescribed o Report any questions or concerns to PharmD and/or provider(s)  Please see past updates related to this goal by clicking on the "Past Updates" button in the selected goal         The patient verbalized understanding of instructions provided today and agreed to receive a mailed copy of patient instruction and/or educational materials.  Telephone follow up appointment with pharmacy team member scheduled for: 01/20/2020  Cassidy Harrell, PharmD Clinical Pharmacist [facility] [phone]

## 2019-07-30 ENCOUNTER — Other Ambulatory Visit: Payer: Self-pay

## 2019-07-30 DIAGNOSIS — G8929 Other chronic pain: Secondary | ICD-10-CM

## 2019-07-30 DIAGNOSIS — I10 Essential (primary) hypertension: Secondary | ICD-10-CM

## 2019-07-30 NOTE — Progress Notes (Signed)
abu

## 2019-08-15 NOTE — Progress Notes (Signed)
I connected with Cassidy Harrell today by telephone and verified that I am speaking with the correct person using two identifiers. Location patient: home Location provider: work Persons participating in the virtual visit: patient, Marine scientist.    I discussed the limitations, risks, security and privacy concerns of performing an evaluation and management service by telephone and the availability of in person appointments. I also discussed with the patient that there may be a patient responsible charge related to this service. The patient expressed understanding and verbally consented to this telephonic visit.    Interactive audio and video telecommunications were attempted between this provider and patient, however failed, due to patient having technical difficulties OR patient did not have access to video capability.  We continued and completed visit with audio only.  Some vital signs may be absent or patient reported.    Subjective:   Cassidy Harrell is a 69 y.o. female who presents for Medicare Annual (Subsequent) preventive examination.  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  Review of Systems:   Cardiac Risk Factors include: advanced age (>53mn, >>22women);diabetes mellitus;hypertension     Objective:     Vitals: Unable to assess. This visit is enabled though telemedicine due to Covid 19.   Advanced Directives 08/18/2019 06/25/2019 04/02/2019 04/01/2019  Does Patient Have a Medical Advance Directive? Yes Yes Yes Yes  Type of AParamed[place]of [place]red[place]Living will [place]Living will [place] will -  Does patient want to make changes to medical advance directive? No - Patient declined - No - Patient declined -  Copy of [place] Chart? No - copy requested No - copy requested No - copy requested -    Tobacco Social History   Tobacco Use  Smoking Status Former  Smoker   Types: Cigarettes   Quit date: 2011   Years since quitting: 10.4  Smokeless Tobacco Never Used     Counseling given: Not Answered   Clinical Intake:  Pain : No/denies pain      Past Medical History:  Diagnosis Date   Anemia    Arthritis    Depression    Diabetes mellitus without complication (HCC)    Dysrhythmia    Over 10 years ago   Gum disease    Heart murmur    History of syphilis    Hypertension    Irregular heart beat    Personal history of colonic polyps    Phlebitis    Pre-diabetes    Past Surgical History:  Procedure Laterality Date   IR RADIOLOGIST EVAL & MGMT  04/15/2019   IR RADIOLOGIST EVAL & MGMT  04/29/2019   IR RADIOLOGIST EVAL & MGMT  05/08/2019   LAPAROSCOPIC APPENDECTOMY N/A 06/30/2019   Procedure: APPENDECTOMY LAPAROSCOPIC;  Surgeon: RRalene Ok MD;  [facility]  Service: General;  Laterality: N/A;   SHOULDER ARTHROSCOPY Right 12/2015   SPINE SURGERY  08/20/2015   bulging / herniated disc in C-spine   THERAPEUTIC ABORTION  1970. 1974, 1978   Family History  Problem Relation Age of Onset   Diabetes Mother    Cancer Father        lung cancer   Kidney disease Father    Seizures Father    Cancer Brother        colon   Heart attack Brother    Stroke Brother    Colon cancer Neg Hx    Stomach  cancer Neg Hx    Social History   Socioeconomic History   Marital status: Divorced    Spouse name: Not on file   Number of children: 1   Years of education: Not on file   Highest education level: Not on file  Occupational History   Not on file  Tobacco Use   Smoking status: Former Smoker    Types: Cigarettes    Quit date: 2011    Years since quitting: 10.4   Smokeless tobacco: Never Used  Scientific laboratory technician Use: Never used  Substance and Sexual Activity   Alcohol use: No   Drug use: No   Sexual activity: Not on file  Other Topics Concern   Not on file  Social History Narrative    Regular exercise:  No   Caffeine Use:  No   Therapist, 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    Divorced   Son Age 16- alive and well   2 grand daughters- age 51 and an adopted Curator age 21   Avinger reading, walking spending time with family, 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[employer]            Social Determinants of Radio broadcast assistant Strain: Low Risk    Difficulty of Paying Living Expenses: Not very hard  Food Insecurity:    Worried About Charity fundraiser in the Last Year:    Arboriculturist in the Last Year:   Transportation Needs:    Film/video editor (Medical):    Lack of Transportation (Non-Medical):   Physical Activity:    Days of Exercise per Week:    Minutes of Exercise per Session:   Stress:    Feeling of Stress :   Social Connections:    Frequency of Communication with Friends and Family:    Frequency of Social Gatherings with Friends and Family:    Attends Religious Services:    Active Member of Clubs or Organizations:    Attends Archivist Meetings:    Marital Status:     Outpatient Encounter Medications as of 08/18/2019  Medication Sig   acetaminophen (TYLENOL) 650 MG CR tablet Take 650 mg by mouth every 8 (eight) hours as needed for pain.   amLODipine (NORVASC) 5 MG tablet TAKE 1 TABLET BY MOUTH  DAILY (Patient taking differently: Take 5 mg by mouth daily with lunch. )   aspirin EC 81 MG tablet Take 81 mg by mouth daily with lunch.   blood glucose meter kit and supplies KIT Dispense based on patient and insurance preference. Use up to four times daily as directed. (FOR ICD-9 250.00, 250.01).   calcium carbonate (OSCAL) 1500 (600 Ca) MG TABS tablet Take 1,200 mg by mouth daily with lunch. 800 units vitamin D   ferrous sulfate 325 (65 FE) MG EC tablet Take 325 mg by mouth daily with lunch.   lisinopril (ZESTRIL) 40 MG tablet TAKE 1 TABLET BY MOUTH   DAILY (Patient taking differently: Take 40 mg by mouth daily with lunch. )   meloxicam (MOBIC) 7.5 MG tablet Take 1 tablet (7.5 mg total) by mouth daily. (Patient taking differently: Take 7.5 mg by mouth daily with lunch. )   Menthol, Topical Analgesic, (BIOFREEZE EX) Apply 1 application topically 3 (three) times daily as needed (pain.).   metoprolol succinate (TOPROL-XL) 25 MG 24 hr tablet TAKE 1 TABLET BY MOUTH  DAILY (Patient taking differently: Take  25 mg by mouth daily with lunch. )   traMADol (ULTRAM) 50 MG tablet Take 1 tablet (50 mg total) by mouth every 6 (six) hours as needed. (Patient not taking: Reported on 08/18/2019)   [DISCONTINUED] ondansetron (ZOFRAN) 4 MG tablet Take 1 tablet (4 mg total) by mouth every 8 (eight) hours as needed for nausea or vomiting.   No facility-administered encounter medications on file as of 08/18/2019.    Activities of Daily Living In your present state of health, do you have any difficulty performing the following activities: 08/18/2019 06/25/2019  Hearing? N N  Vision? N N  Difficulty concentrating or making decisions? N N  Walking or climbing stairs? N Y  Dressing or bathing? N N  Doing errands, shopping? N N  Preparing Food and eating ? N -  Using the Toilet? N -  In the past six months, have you accidently leaked urine? N -  Do you have problems with loss of bowel control? N -  Managing your Medications? N -  Managing your Finances? N -  Housekeeping or managing your Housekeeping? N -  Some recent data might be hidden    Patient Care Team: [name], NP as PCP - General (Internal Medicine) Karie Chimera, MD as Consulting Physician (Neurosurgery) Day, Melvenia Beam, [facility] as Pharmacist (Pharmacist)    Assessment:   This is a routine wellness examination for Luvina.  Exercise Activities and Dietary recommendations Current Exercise Habits: The patient does not participate in regular exercise at present, Exercise limited by: None  identified Diet (meal preparation, eat out, water intake, caffeinated beverages, dairy products, fruits and vegetables): in general, a "healthy" diet  , well balanced   Goals Addressed            This Visit's Progress    Increase physical activity       Treadmill 3x/ week. 10 minutes at a time.       Fall Risk: Fall Risk  08/18/2019 04/04/2017 05/19/2015  Falls in the past year? 0 No No  Number falls in past yr: 0 - -  Injury with Fall? 0 - -  Follow up Education provided;Falls prevention discussed - -    FALL RISK PREVENTION PERTAINING TO THE HOME:  Lives alone in 1 story home.   Any stairs in or around the home? No  If so, are there any without handrails? No   Home free of loose throw rugs in walkways, pet beds, electrical cords, etc? Yes  Adequate lighting in your home to reduce risk of falls? Yes   ASSISTIVE DEVICES UTILIZED TO PREVENT FALLS:  Life alert? No  Use of a cane, walker or w/c? No  Grab bars in the bathroom? Yes  Shower chair or bench in shower? No  Elevated toilet seat or a handicapped toilet? No   DME ORDERS:  DME order needed?  No   Depression Screen PHQ 2/9 Scores 05/19/2015  PHQ - 2 Score 0     Cognitive Function Ad8 score reviewed for issues:  Issues making decisions:no  Less interest in hobbies / activities:no  Repeats questions, stories (family complaining):no  Trouble using ordinary gadgets (microwave, computer, phone):no  Forgets the month or year: no  Mismanaging finances: no  Remembering appts:no  Daily problems with thinking and/or memory:no Ad8 score is=0         Immunization History  Administered Date(s) Administered   Fluad Quad(high Dose 65+) 11/05/2018   Influenza Split 11/13/2011   Influenza, High Dose Seasonal PF 03/10/2016,  01/02/2017, 02/08/2018   Influenza,inj,Quad PF,6+ Mos 11/24/2013   Influenza-Unspecified 11/27/2012   PFIZER SARS-COV-2 Vaccination 04/12/2019, 05/06/2019   Pneumococcal  Conjugate-13 04/04/2017   Pneumococcal Polysaccharide-23 11/24/2013   Tdap 04/01/2013   Zoster 05/28/2015    Qualifies for Shingles Vaccine? Yes  Zostavax completed 05/28/15. Due for Shingrix. Education has been provided regarding the importance of this vaccine. Pt has been advised to call insurance company to determine out of pocket expense. Advised may also receive vaccine at local pharmacy or Health Dept. Verbalized acceptance and understanding.  Tdap: done 04/11/13  Flu Vaccine: done 11/05/18  Pneumococcal Vaccine: done 04/04/17  Covid-19 Vaccine: Completed vaccines  Screening Tests Health Maintenance  Topic Date Due   COLONOSCOPY  12/19/2016   PNA vac Low Risk Adult (2 of 2 - PPSV23) 11/25/2018   OPHTHALMOLOGY EXAM  03/27/2019   INFLUENZA VACCINE  09/28/2019   FOOT EXAM  11/05/2019   HEMOGLOBIN A1C  12/25/2019   MAMMOGRAM  05/19/2020   TETANUS/TDAP  04/02/2023   DEXA SCAN  Completed   COVID-19 Vaccine  Completed   Hepatitis C Screening  Completed    Cancer Screenings:  Colorectal Screening: Completed 05/20/11. Repeat every 10 years.  Mammogram: Completed 05/20/19. Repeat every year.  Bone Density: Completed 06/01/15. Results reflect NORMAL. Repeat every 2 years.   Lung Cancer Screening: (Low Dose CT Chest recommended if Age 38-80 years, 30 pack-year currently smoking OR have quit w/in 15years.) does qualify. Pt will discuss w/ PCP at visit tomorrow.   Additional Screening:  Hepatitis C Screening:  Completed 05/19/15  Vision Screening: Recommended annual ophthalmology exams for early detection of glaucoma and other disorders of the eye. Is the patient up to date with their annual eye exam?  Yes  per pt.   Dental Screening: Recommended annual dental exams for proper oral hygiene  Community Resource Referral:  CRR required this visit?  No       Plan:  Please schedule your next medicare wellness visit with me in 1 yr.  Continue to eat heart healthy diet  (full of fruits, vegetables, whole grains, lean protein, water--limit salt, fat, and sugar intake) and increase physical activity as tolerated.  Continue doing brain stimulating activities (puzzles, reading, adult coloring books, staying active) to keep memory sharp.   Bring a copy of your living will and/or healthcare power of attorney to your next office visit.   I have personally reviewed and addressed the Medicare Annual Wellness questionnaire and have noted the following in the patients chart:  A. Medical and social history B. Use of alcohol, tobacco or illicit drugs  C. Current medications and supplements D. Functional ability and status E.  Nutritional status F.  Physical activity G. Advance directives H. List of other physicians I.  Hospitalizations, surgeries, and ER visits in previous 12 months J.  [place] such as hearing and vision if needed, cognitive and depression L. Referrals and appointments   In addition, I have reviewed and discussed with patient certain preventive protocols, quality metrics, and best practice recommendations. A written personalized care plan for preventive services as well as general preventive health recommendations were provided to patient.  Due to this being a telephonic visit, the after visit summary with patients personalized plan was offered to patient via mail or my-chart.  Patient would like to access on my-chart. Signed,    Shela Nevin, RN  08/18/2019 Nurse Health Advisor

## 2019-08-18 ENCOUNTER — Ambulatory Visit (INDEPENDENT_AMBULATORY_CARE_PROVIDER_SITE_OTHER): Payer: Medicare Other | Admitting: *Deleted

## 2019-08-18 ENCOUNTER — Encounter: Payer: Self-pay | Admitting: *Deleted

## 2019-08-18 DIAGNOSIS — Z0000 Encounter for general adult medical examination without abnormal findings: Secondary | ICD-10-CM

## 2019-08-18 NOTE — Patient Instructions (Signed)
Please schedule your next medicare wellness visit with me in 1 yr.  Continue to eat heart healthy diet (full of fruits, vegetables, whole grains, lean protein, water--limit salt, fat, and sugar intake) and increase physical activity as tolerated.  Continue doing brain stimulating activities (puzzles, reading, adult coloring books, staying active) to keep memory sharp.   Bring a copy of your living will and/or healthcare power of attorney to your next office visit.   Cassidy Harrell , Thank you for taking time to come for your Medicare Wellness Visit. I appreciate your ongoing commitment to your health goals. Please review the following plan we discussed and let me know if I can assist you in the future.   These are the goals we discussed: Goals    . Increase physical activity     Treadmill 3x/ week. 10 minutes at a time.    [facility] Care Plan     CARE PLAN ENTRY  Current Barriers:  . Chronic Disease Management support, education, and care coordination needs related to Diabetes, Hypertension, Pain   Hypertension . Pharmacist Clinical Goal(s): o Over the next 90 days, patient will work with PharmD and providers to maintain BP goal <140/90 (BP improved since last CCM assessment) o Home BP Avg: 135/71 . Current regimen:  o Amlodipine 44m daily, lisinopril 465mdaily, metoprolol succinate 2518maily . Interventions: o Requested patient to check blood pressure 3 times per week and record . Patient self care activities - Over the next 90 days, patient will: o Check BP three times weekly, document, and provide at future appointments o Ensure daily salt intake < 2300 mg/day  Diabetes . Pharmacist Clinical Goal(s): o Over the next 90 days, patient will work with PharmD and providers to maintain A1c goal <7% (a1c improved since last CCM assessment) o 11/05/18: a1c = 7.1% vs 06/25/19: a1c = 6.2% . Current regimen:  o Diet and exercise management   . Interventions: o Requested patient to  reduce ice cream intake to 3 days a week versus every day o Requested patient to check blood sugar 3 times per week and record . Patient self care activities - Over the next 90 days, patient will: o Check blood sugar three times per week document, and provide at future appointments o Continue limiting ice cream to 3 times per week or less o Contact provider with any episodes of hypoglycemia  Pain . Pharmacist Clinical Goal(s) o Over the next 90 days, patient will work with PharmD and providers to reduce patient's symptoms of pain . Current regimen:  o Meloxicam 7.5mg48mily, biofreeze as needed, aspercreme as needed . Interventions: o Consider increasing meloxicam to 15mg80mly to help with pain . Patient self care activities - Over the next 90 days, patient will: o Maintain pain medication regimen  Medication management . Pharmacist Clinical Goal(s): o Over the next 90 days, patient will work with PharmD and providers to maintain optimal medication adherence . [facility] o Comprehensive medication review performed. o Continue current medication management strategy . Patient self care activities - Over the next 90 days, patient will: o Focus on medication adherence by filling medications appropriately  o Take medications as prescribed o Report any questions or concerns to PharmD and/or provider(s)  Please see past updates related to this goal by clicking on the "Past Updates" button in the selected goal         This is a list of the screening recommended for you  and due dates:  Health Maintenance  Topic Date Due  . Colon Cancer Screening  12/19/2016  . Pneumonia vaccines (2 of 2 - PPSV23) 11/25/2018  . Eye exam for diabetics  03/27/2019  . Flu Shot  09/28/2019  . Complete foot exam   11/05/2019  . Hemoglobin A1C  12/25/2019  . Mammogram  05/19/2020  . Tetanus Vaccine  04/02/2023  . DEXA scan (bone density measurement)  Completed  .  COVID-19 Vaccine  Completed  .  Hepatitis C: One time screening is recommended by Center for Disease Control  (CDC) for  adults born from [birth year] through 1965.   Completed    Preventive Care 4 Years and Older, Female Preventive care refers to lifestyle choices and visits with your health care provider that can promote health and wellness. This includes:  A yearly physical exam. This is also called an annual well check.  Regular dental and eye exams.  Immunizations.  Screening for certain conditions.  Healthy lifestyle choices, such as diet and exercise. What can I expect for my preventive care visit? Physical exam Your health care provider will check:  Height and weight. These may be used to calculate body mass index (BMI), which is a measurement that tells if you are at a healthy weight.  Heart rate and blood pressure.  Your skin for abnormal spots. Counseling Your health care provider may ask you questions about:  Alcohol, tobacco, and drug use.  Emotional well-being.  Home and relationship well-being.  Sexual activity.  Eating habits.  History of falls.  Memory and ability to understand (cognition).  Work and work Statistician.  Pregnancy and menstrual history. What immunizations do I need?  Influenza (flu) vaccine  This is recommended every year. Tetanus, diphtheria, and pertussis (Tdap) vaccine  You may need a Td booster every 10 years. Varicella (chickenpox) vaccine  You may need this vaccine if you have not already been vaccinated. Zoster (shingles) vaccine  You may need this after age 54. Pneumococcal conjugate (PCV13) vaccine  One dose is recommended after age 58. Pneumococcal polysaccharide (PPSV23) vaccine  One dose is recommended after age 71. Measles, mumps, and rubella (MMR) vaccine  You may need at least one dose of MMR if you were born in 1957 or later. You may also need a second dose. Meningococcal conjugate (MenACWY) vaccine  You  may need this if you have certain conditions. Hepatitis A vaccine  You may need this if you have certain conditions or if you travel or work in places where you may be exposed to hepatitis A. Hepatitis B vaccine  You may need this if you have certain conditions or if you travel or work in places where you may be exposed to hepatitis B. Haemophilus influenzae type b (Hib) vaccine  You may need this if you have certain conditions. You may receive vaccines as individual doses or as more than one vaccine together in one shot (combination vaccines). Talk with your health care provider about the risks and benefits of combination vaccines. What tests do I need? Blood tests  Lipid and cholesterol levels. These may be checked every 5 years, or more frequently depending on your overall health.  Hepatitis C test.  Hepatitis B test. Screening  Lung cancer screening. You may have this screening every year starting at age 20 if you have a 30-pack-year history of smoking and currently smoke or have quit within the past 15 years.  Colorectal cancer screening. All adults should have this screening starting at  age 9 and continuing until age 31. Your health care provider may recommend screening at age 27 if you are at increased risk. You will have tests every 1-10 years, depending on your results and the type of screening test.  Diabetes screening. This is done by checking your blood sugar (glucose) after you have not eaten for a while (fasting). You may have this done every 1-3 years.  Mammogram. This may be done every 1-2 years. Talk with your health care provider about how often you should have regular mammograms.  BRCA-related cancer screening. This may be done if you have a family history of breast, ovarian, tubal, or peritoneal cancers. Other tests  Sexually transmitted disease (STD) testing.  Bone density scan. This is done to screen for osteoporosis. You may have this done starting at age  66. Follow these instructions at home: Eating and drinking  Eat a diet that includes fresh fruits and vegetables, whole grains, lean protein, and low-fat dairy products. Limit your intake of foods with high amounts of sugar, saturated fats, and salt.  Take vitamin and mineral supplements as recommended by your health care provider.  Do not drink alcohol if your health care provider tells you not to drink.  If you drink alcohol: ? Limit how much you have to 0-1 drink a day. ? Be aware of how much alcohol is in your drink. In the [place] one drink equals one 12 oz bottle of beer (355 mL), one 5 oz glass of wine (148 mL), or one 1 oz glass of hard liquor (44 mL). Lifestyle  Take daily care of your teeth and gums.  Stay active. Exercise for at least 30 minutes on 5 or more days each week.  Do not use any products that contain nicotine or tobacco, such as cigarettes, e-cigarettes, and chewing tobacco. If you need help quitting, ask your health care provider.  If you are sexually active, practice safe sex. Use a condom or other form of protection in order to prevent STIs (sexually transmitted infections).  Talk with your health care provider about taking a low-dose aspirin or statin. What's next?  Go to your health care provider once a year for a well check visit.  Ask your health care provider how often you should have your eyes and teeth checked.  Stay up to date on all vaccines. This information is not intended to replace advice given to you by your health care provider. Make sure you discuss any questions you have with your health care provider. Document Revised: 02/07/2018 Document Reviewed: 02/07/2018 Elsevier Patient Education  2020 [employer].

## 2019-08-19 ENCOUNTER — Other Ambulatory Visit: Payer: Self-pay | Admitting: Family

## 2019-08-19 ENCOUNTER — Other Ambulatory Visit: Payer: Self-pay

## 2019-08-19 ENCOUNTER — Ambulatory Visit (INDEPENDENT_AMBULATORY_CARE_PROVIDER_SITE_OTHER): Payer: Medicare Other | Admitting: Family

## 2019-08-19 ENCOUNTER — Encounter: Payer: Self-pay | Admitting: Family

## 2019-08-19 VITALS — BP 128/66 | HR 78 | Temp 97.5°F | Resp 16 | Ht 63.0 in | Wt 185.0 lb

## 2019-08-19 DIAGNOSIS — Z9049 Acquired absence of other specified parts of digestive tract: Secondary | ICD-10-CM | POA: Diagnosis not present

## 2019-08-19 DIAGNOSIS — I10 Essential (primary) hypertension: Secondary | ICD-10-CM | POA: Diagnosis not present

## 2019-08-19 DIAGNOSIS — N83209 Unspecified ovarian cyst, unspecified side: Secondary | ICD-10-CM | POA: Diagnosis not present

## 2019-08-19 DIAGNOSIS — R609 Edema, unspecified: Secondary | ICD-10-CM | POA: Diagnosis not present

## 2019-08-19 DIAGNOSIS — E119 Type 2 diabetes mellitus without complications: Secondary | ICD-10-CM | POA: Diagnosis not present

## 2019-08-19 MED ORDER — PANTOPRAZOLE SODIUM 40 MG PO TBEC
40.0000 mg | DELAYED_RELEASE_TABLET | Freq: Every day | ORAL | 3 refills | Status: DC
Start: 2019-08-19 — End: 2019-12-17

## 2019-08-19 MED ORDER — METOPROLOL SUCCINATE ER 50 MG PO TB24: 50.0000 mg | ORAL_TABLET | Freq: Every day | ORAL | 5 refills | Status: DC

## 2019-08-19 NOTE — Progress Notes (Signed)
Subjective:    Patient ID: Cassidy Harrell, female    DOB: October 31, 1950, 69 y.o.   MRN: [id number]  HPI  Patient is a 69 yr old female who presents today for follow up.  HTN- maintaine on amlodipine 66m on amlodipine, metoprolol and lisinopril.   BP Readings from Last 3 Encounters:  08/19/19 128/66  06/30/19 140/72  06/25/19 140/72   Perforated appendicitis- she underwent appendectomy 06/30/19.    Borderline DM- reports that she has been working on her diet.  Lab Results  Component Value Date   HGBA1C 6.2 (H) 06/25/2019   HGBA1C 7.1 (H) 11/05/2018   HGBA1C 6.3 (H) 02/08/2018   Lab Results  Component Value Date   MICROALBUR 0.7 09/16/2014   LDLCALC 13 10/03/2016   CREATININE 0.69 06/25/2019   Notes that she has intermittent nausea.  Reports that this occurs twice a month. She thinks that garlic worsens her nausea, another episode was with broccoli.   Ovarian cyst- repeat [place] noted the following:  Simple cyst LEFT ovary unchanged.  Mildly complicated/septated cyst of RIGHT ovary, slightly smaller.  Review of Systems See HPI  Past Medical History:  Diagnosis Date  . Anemia   . Arthritis   . Depression   . [place] complication ([place]   . Dysrhythmia    Over 10 years ago  . Gum disease   . Heart murmur   . History of syphilis   . Hypertension   . Irregular heart beat   . Personal history of colonic polyps   . Phlebitis   . Pre-diabetes      Social History   Socioeconomic History  . Marital status: Divorced    Spouse name: Not on file  . Number of children: 1  . Years of education: Not on file  . Highest education level: Not on file  Occupational History  . Not on file  Tobacco Use  . Smoking status: Former Smoker    Types: Cigarettes    Quit date: 2011    Years since quitting: 10.4  . Smokeless tobacco: Never Used  Vaping Use  . Vaping Use: Never used  Substance and Sexual Activity  . Alcohol use: No  . Drug use: No    . Sexual activity: Not on file  Other Topics Concern  . 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 Difficulty of Paying Living Expenses: Not very hard  Food Insecurity: No Food Insecurity  . Worried About RCharity fundraiserin the Last Year: Never true  . Ran Out of Food in the Last Year: Never true  Transportation Needs: No Transportation Needs  . Lack of Transportation (Medical): No  . Lack of Transportation (Non-Medical): No  Physical Activity:   . Days of Exercise per Week:   . Minutes of Exercise per Session:   Stress:   . Feeling of Stress :   Social Connections:   . Frequency of Communication with Friends  and Family:   . Frequency of Social Gatherings with Friends and Family:   . Attends Religious Services:   . Active Member of Clubs or Organizations:   . Attends Archivist Meetings:   Marland Kitchen Marital Status:   Intimate Partner Violence:   . Fear of Current or Ex-Partner:   . Emotionally Abused:   Marland Kitchen Physically Abused:   . Sexually Abused:     Past Surgical History:  Procedure Laterality Date  . IR RADIOLOGIST EVAL & MGMT  04/15/2019  . IR RADIOLOGIST EVAL & MGMT  04/29/2019  . IR RADIOLOGIST EVAL & MGMT  05/08/2019  . LAPAROSCOPIC APPENDECTOMY N/A 06/30/2019   Procedure: APPENDECTOMY LAPAROSCOPIC;  Surgeon: Ralene Ok, MD;  Location: [facility];  Service: General;  Laterality: N/A;  . SHOULDER ARTHROSCOPY Right 12/2015  . SPINE SURGERY  08/20/2015   bulging / herniated disc in C-spine  . THERAPEUTIC ABORTION  1970. 1974, 1978     Family History  Problem Relation Age of Onset  . Diabetes Mother   . Cancer Father        lung cancer  . Kidney disease Father   . Seizures Father   . Cancer Brother        colon  . Heart attack Brother   . Stroke Brother   . Colon cancer Neg Hx   . Stomach cancer Neg Hx     Allergies  Allergen Reactions  . Aldactone [Spironolactone] Other (See Comments)    itching    Current Outpatient Medications on File Prior to Visit  Medication Sig Dispense Refill  . acetaminophen (TYLENOL) 650 MG CR tablet Take 650 mg by mouth every 8 (eight) hours as needed for pain.    Marland Kitchen amLODipine (NORVASC) 5 MG tablet TAKE 1 TABLET BY MOUTH  DAILY (Patient taking differently: Take 5 mg by mouth daily with lunch. ) 90 tablet 1  . aspirin EC 81 MG tablet Take 81 mg by mouth daily with lunch.    . blood glucose meter kit and supplies KIT Dispense based on patient and insurance preference. Use up to four times daily as directed. (FOR ICD-9 250.00, 250.01). 1 each 0  . calcium carbonate (OSCAL) 1500 (600 Ca) MG TABS tablet Take 1,200 mg by mouth daily with lunch. 800 units vitamin D    . ferrous sulfate 325 (65 FE) MG EC tablet Take 325 mg by mouth daily with lunch.    . lisinopril (ZESTRIL) 40 MG tablet TAKE 1 TABLET BY MOUTH  DAILY (Patient taking differently: Take 40 mg by mouth daily with lunch. ) 90 tablet 3  . meloxicam (MOBIC) 7.5 MG tablet Take 1 tablet (7.5 mg total) by mouth daily. (Patient taking differently: Take 7.5 mg by mouth daily with lunch. ) 30 tablet 5  . Menthol, Topical Analgesic, (BIOFREEZE EX) Apply 1 application topically 3 (three) times daily as needed (pain.).    Marland Kitchen metoprolol succinate (TOPROL-XL) 25 MG 24 hr tablet TAKE 1 TABLET BY MOUTH  DAILY (Patient taking differently: Take 25 mg by mouth daily with lunch. ) 90 tablet 3  . traMADol (ULTRAM) 50 MG tablet Take 1 tablet (50 mg total) by mouth every 6 (six) hours as needed. (Patient not taking: Reported on 08/18/2019) 20 tablet 0    No current facility-administered medications on file prior to visit.    BP 128/66 (BP Location: Right Arm, Patient Position: Sitting, Cuff Size: Large)   Pulse 78   Temp (!) 97.5 F (36.4 C) (Temporal)  Resp 16   Ht 5' 3" (1.6 m)   Wt 185 lb (83.9 kg)   SpO2 100%   BMI 32.77 kg/m       Objective:   Physical Exam Constitutional:      Appearance: She is well-developed.  Neck:     Thyroid: No thyromegaly.  Cardiovascular:     Rate and Rhythm: Normal rate and regular rhythm.     Heart sounds: Normal heart sounds. No murmur heard.      Comments: 2-3 + bilateral LE edema Pulmonary:     Effort: Pulmonary effort is normal. No respiratory distress.     Breath sounds: Normal breath sounds. No wheezing.  Musculoskeletal:     Cervical back: Neck supple.  Skin:    General: Skin is warm and dry.  Neurological:     Mental Status: She is alert and oriented to person, place, and time.  Psychiatric:        Behavior: Behavior normal.        Thought Content: Thought content normal.        Judgment: Judgment normal.           Assessment & Plan:  Edema- pt is bothered by this and would like to make changes to improve. See below.  HTN- due to edema, will d/c amlodipine. Increase toprol xl from 50m to 551monce daily. Repeat bp in 2 weeks.  Borderline DM2- A1C is stable/improved.  Monitor.  Ovarian cyst- follow up [place] in size which is reassuring.    S/p appendectomy- clinically stable post-op.  Nausea- could be related to meloxicam? She does not feel that she can tolerate her back pain off of the meloxicam. Discussed GI referral but she wishes to hold off.  Will give trial of protonix to see if this helps.   This visit occurred during the SARS-CoV-2 public health emergency.  Safety protocols were in place, including screening questions prior to the visit, additional usage of staff PPE, and extensive cleaning of exam room while observing appropriate contact time  as indicated for disinfecting solutions.

## 2019-08-19 NOTE — Patient Instructions (Addendum)
Stop amlodipine, increase toprol from 25mg  to 50mg .  Start protonix. Let me know if your nausea worsens or does not improve.

## 2019-09-03 ENCOUNTER — Ambulatory Visit: Payer: Medicare Other | Admitting: Family

## 2019-09-09 ENCOUNTER — Ambulatory Visit (INDEPENDENT_AMBULATORY_CARE_PROVIDER_SITE_OTHER): Payer: Medicare Other | Admitting: Family

## 2019-09-09 ENCOUNTER — Other Ambulatory Visit: Payer: Self-pay

## 2019-09-09 ENCOUNTER — Encounter: Payer: Self-pay | Admitting: Family

## 2019-09-09 VITALS — BP 154/80 | HR 79 | Temp 98.3°F | Resp 17 | Ht 63.0 in | Wt 184.0 lb

## 2019-09-09 DIAGNOSIS — I10 Essential (primary) hypertension: Secondary | ICD-10-CM

## 2019-09-09 MED ORDER — HYDROCHLOROTHIAZIDE 25 MG PO TABS
25.0000 mg | ORAL_TABLET | Freq: Every day | ORAL | 3 refills | Status: DC
Start: 2019-09-09 — End: 2019-12-31

## 2019-09-09 NOTE — Progress Notes (Signed)
Subjective:    Patient ID: Cassidy Harrell, female    DOB: [birth year], 69 y.o.   MRN: [id number]  HPI  Patient is a 69 year old-year-old female who presents today for follow-up.  HTN-last visit she was bothered by lower extremity edema.  As a result we discontinued amlodipine.  Her metoprolol was increased from 25 mg to 50 mg once daily.  She was continued on lisinopril 40 mg once daily.  She reports tolerating this medication change, however she has noted no improvement in her lower extremity edema, and her home blood pressure readings have been elevated.  BP Readings from Last 3 Encounters:  09/09/19 (!) 154/80  08/19/19 128/66  06/30/19 140/72    GERD- improved with the addition of pantoprazole.  Review of Systems     Past Medical History:  Diagnosis Date  . Anemia   . Arthritis   . Depression   . Diabetes mellitus without complication ([place])   . Dysrhythmia    Over 10 years ago  . Gum disease   . Heart murmur   . History of syphilis   . Hypertension   . Irregular heart beat   . Personal history of colonic polyps   . Phlebitis   . Pre-diabetes      Social History   Socioeconomic History  . Marital status: Divorced    Spouse name: Not on file  . Number of children: 1  . Years of education: Not on file  . Highest education level: Not on file  Occupational History  . Not on file  Tobacco Use  . Smoking status: Former Smoker    Types: Cigarettes    Quit date: 2011    Years since quitting: 10.5  . Smokeless tobacco: Never Used  Vaping Use  . Vaping Use: Never used  Substance and Sexual Activity  . Alcohol use: No  . Drug use: No  . Sexual activity: Not on file  Other Topics Concern  . 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 Difficulty of Paying Living Expenses: Not very hard  Food Insecurity: No Food Insecurity  . Worried About Charity fundraiser in the Last Year: Never true  . Ran Out of Food in the Last Year: Never true  Transportation Needs: No Transportation Needs  . Lack of Transportation (Medical): No  . Lack of Transportation (Non-Medical): No  Physical Activity:   . Days of Exercise per Week:   . Minutes of Exercise per Session:   Stress:   . Feeling of Stress :   Social Connections:   . Frequency of Communication with Friends and Family:   . Frequency of Social Gatherings with Friends and Family:   . Attends Religious Services:   . Active Member of Clubs or Organizations:   . Attends Archivist Meetings:   Marland Kitchen Marital Status:   Intimate Partner Violence:   . Fear of Current or Ex-Partner:   . Emotionally Abused:   .  Physically Abused:   . Sexually Abused:     Past Surgical History:  Procedure Laterality Date  . IR RADIOLOGIST EVAL & MGMT  04/15/2019  . IR RADIOLOGIST EVAL & MGMT  04/29/2019  . IR RADIOLOGIST EVAL & MGMT  05/08/2019  . LAPAROSCOPIC APPENDECTOMY N/A 06/30/2019   Procedure: APPENDECTOMY LAPAROSCOPIC;  Surgeon: Ralene Ok, MD;  Location: [facility];  Service: General;  Laterality: N/A;  . SHOULDER ARTHROSCOPY Right 12/2015  . SPINE SURGERY  08/20/2015   bulging / herniated disc in C-spine  . THERAPEUTIC ABORTION  1970. 1974, 1978    Family History  Problem Relation Age of Onset  . Diabetes Mother   . Cancer Father        lung cancer  . Kidney disease Father   . Seizures Father   . Cancer Brother        colon  . Heart attack Brother   . Stroke Brother   . Colon cancer Neg Hx   . Stomach cancer Neg Hx     Allergies  Allergen Reactions  .  Aldactone [Spironolactone] Other (See Comments)    itching    Current Outpatient Medications on File Prior to Visit  Medication Sig Dispense Refill  . acetaminophen (TYLENOL) 650 MG CR tablet Take 650 mg by mouth every 8 (eight) hours as needed for pain.    Marland Kitchen aspirin EC 81 MG tablet Take 81 mg by mouth daily with lunch.    . blood glucose meter kit and supplies KIT Dispense based on patient and insurance preference. Use up to four times daily as directed. (FOR ICD-9 250.00, 250.01). 1 each 0  . calcium carbonate (OSCAL) 1500 (600 Ca) MG TABS tablet Take 1,200 mg by mouth daily with lunch. 800 units vitamin D    . ferrous sulfate 325 (65 FE) MG EC tablet Take 325 mg by mouth daily with lunch.    . lisinopril (ZESTRIL) 40 MG tablet TAKE 1 TABLET BY MOUTH  DAILY (Patient taking differently: Take 40 mg by mouth daily with lunch. ) 90 tablet 3  . meloxicam (MOBIC) 7.5 MG tablet Take 1 tablet (7.5 mg total) by mouth daily. (Patient taking differently: Take 7.5 mg by mouth daily with lunch. ) 30 tablet 5  . metoprolol succinate (TOPROL-XL) 50 MG 24 hr tablet Take 1 tablet (50 mg total) by mouth daily. Take with or immediately following a meal. 30 tablet 5  . pantoprazole (PROTONIX) 40 MG tablet Take 1 tablet (40 mg total) by mouth daily. 30 tablet 3   No current facility-administered medications on file prior to visit.    BP (!) 154/80 (BP Location: Left Arm, Patient Position: Sitting, Cuff Size: Large)   Pulse 79   Temp 98.3 F (36.8 C) (Oral)   Resp 17   Ht '5\' 3"'  (1.6 m)   Wt 184 lb (83.5 kg)   SpO2 98%   BMI 32.59 kg/m    Objective:   Physical Exam Constitutional:      Appearance: She is well-developed.  Cardiovascular:     Rate and Rhythm: Normal rate and regular rhythm.     Heart sounds: Normal heart sounds. No murmur heard.   Pulmonary:     Effort: Pulmonary effort is normal. No respiratory distress.     Breath sounds: Normal breath sounds. No wheezing.  Musculoskeletal:      Right lower leg: 2+ Edema present.     Left lower leg: 2+ Edema present.  Psychiatric:  Behavior: Behavior normal.        Thought Content: Thought content normal.        Judgment: Judgment normal.           Assessment & Plan:  Hypertension-no improvement in her lower extremity edema.  Blood pressure is elevated.  Will add hydrochlorothiazide 25 mg once daily to her regimen to help with her blood pressure as well as her lower extremity edema.  Plan follow-up in about 10 days.  Will need follow-up basic metabolic panel at that time.

## 2019-09-19 ENCOUNTER — Encounter: Payer: Self-pay | Admitting: Family

## 2019-09-19 ENCOUNTER — Other Ambulatory Visit: Payer: Self-pay

## 2019-09-19 ENCOUNTER — Ambulatory Visit (INDEPENDENT_AMBULATORY_CARE_PROVIDER_SITE_OTHER): Payer: Medicare Other | Admitting: Family

## 2019-09-19 VITALS — BP 147/56 | HR 77 | Temp 98.4°F | Resp 16 | Wt 184.0 lb

## 2019-09-19 DIAGNOSIS — I10 Essential (primary) hypertension: Secondary | ICD-10-CM

## 2019-09-19 MED ORDER — METOPROLOL SUCCINATE ER 50 MG PO TB24: 75.0000 mg | ORAL_TABLET | Freq: Every day | ORAL | 1 refills | Status: DC

## 2019-09-19 NOTE — Progress Notes (Signed)
Subjective:    Patient ID: Cassidy Harrell, female    DOB: [birth year], 69 y.o.   MRN: [id number]  HPI  Patient is a 69 yr old female who presents today for follow up of her hypertension.  Last visit she c/o persistent LE edema despite discontinuation of amlodipine.  We added HCTZ once daily. She notes that she has been urinating more but is ok with that. Notes improvement in her LE edema.   BP Readings from Last 3 Encounters:  09/19/19 (!) 147/56  09/09/19 (!) 154/80  08/19/19 128/66     Review of Systems See HPI  Past Medical History:  Diagnosis Date  . Anemia   . Arthritis   . Depression   . Diabetes mellitus without complication ([facility])   . Dysrhythmia    Over 10 years ago  . Gum disease   . Heart murmur   . History of syphilis   . Hypertension   . Irregular heart beat   . Personal history of colonic polyps   . Phlebitis   . Pre-diabetes      Social History   Socioeconomic History  . Marital status: Divorced    Spouse name: Not on file  . Number of children: 1  . Years of education: Not on file  . Highest education level: Not on file  Occupational History  . Not on file  Tobacco Use  . Smoking status: Former Smoker    Types: Cigarettes    Quit date: 2011    Years since quitting: 10.5  . Smokeless tobacco: Never Used  Vaping Use  . Vaping Use: Never used  Substance and Sexual Activity  . Alcohol use: No  . Drug use: No  . Sexual activity: Not on file  Other Topics Concern  . 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  Difficulty of Paying Living Expenses: Not very hard  Food Insecurity: No Food Insecurity  . Worried About Charity fundraiser in the Last Year: Never true  . Ran Out of Food in the Last Year: Never true  Transportation Needs: No Transportation Needs  . Lack of Transportation (Medical): No  . Lack of Transportation (Non-Medical): No  Physical Activity:   . Days of Exercise per Week:   . Minutes of Exercise per Session:   Stress:   . Feeling of Stress :   Social Connections:   . Frequency of Communication with Friends and Family:   . Frequency of Social Gatherings with Friends and Family:   . Attends Religious Services:   . Active Member of Clubs or Organizations:   . Attends Archivist Meetings:   Marland Kitchen Marital Status:   Intimate Partner Violence:   . Fear of Current or Ex-Partner:   . Emotionally Abused:   Marland Kitchen Physically Abused:   . Sexually Abused:     Past Surgical History:  Procedure Laterality Date  . IR RADIOLOGIST EVAL & MGMT  04/15/2019  .  IR RADIOLOGIST EVAL & MGMT  04/29/2019  . IR RADIOLOGIST EVAL & MGMT  05/08/2019  . LAPAROSCOPIC APPENDECTOMY N/A 06/30/2019   Procedure: APPENDECTOMY LAPAROSCOPIC;  Surgeon: Ralene Ok, MD;  Location: [facility];  Service: General;  Laterality: N/A;  . SHOULDER ARTHROSCOPY Right 12/2015  . SPINE SURGERY  08/20/2015   bulging / herniated disc in C-spine  . THERAPEUTIC ABORTION  1970. 1974, 1978    Family History  Problem Relation Age of Onset  . Diabetes Mother   . Cancer Father        lung cancer  . Kidney disease Father   . Seizures Father   . Cancer Brother        colon  . Heart attack Brother   . Stroke Brother   . Colon cancer Neg Hx   . Stomach cancer Neg Hx     Allergies  Allergen Reactions  . Aldactone [Spironolactone] Other (See Comments)    itching    Current Outpatient Medications on File Prior to Visit  Medication Sig Dispense Refill  . acetaminophen (TYLENOL) 650 MG CR tablet Take 650 mg by mouth  every 8 (eight) hours as needed for pain.    Marland Kitchen aspirin EC 81 MG tablet Take 81 mg by mouth daily with lunch.    . blood glucose meter kit and supplies KIT Dispense based on patient and insurance preference. Use up to four times daily as directed. (FOR ICD-9 250.00, 250.01). 1 each 0  . calcium carbonate (OSCAL) 1500 (600 Ca) MG TABS tablet Take 1,200 mg by mouth daily with lunch. 800 units vitamin D    . ferrous sulfate 325 (65 FE) MG EC tablet Take 325 mg by mouth daily with lunch.    . hydrochlorothiazide (HYDRODIURIL) 25 MG tablet Take 1 tablet (25 mg total) by mouth daily. 30 tablet 3  . lisinopril (ZESTRIL) 40 MG tablet TAKE 1 TABLET BY MOUTH  DAILY (Patient taking differently: Take 40 mg by mouth daily with lunch. ) 90 tablet 3  . meloxicam (MOBIC) 7.5 MG tablet Take 1 tablet (7.5 mg total) by mouth daily. (Patient taking differently: Take 7.5 mg by mouth daily with lunch. ) 30 tablet 5  . metoprolol succinate (TOPROL-XL) 50 MG 24 hr tablet Take 1 tablet (50 mg total) by mouth daily. Take with or immediately following a meal. 30 tablet 5  . pantoprazole (PROTONIX) 40 MG tablet Take 1 tablet (40 mg total) by mouth daily. 30 tablet 3   No current facility-administered medications on file prior to visit.    BP (!) 147/56 (BP Location: Right Arm, Patient Position: Sitting, Cuff Size: Large)   Pulse 77   Temp 98.4 F (36.9 C) (Oral)   Resp 16   Wt 184 lb (83.5 kg)   SpO2 100%   BMI 32.59 kg/m       Objective:   Physical Exam Constitutional:      Appearance: She is well-developed.  Cardiovascular:     Rate and Rhythm: Normal rate and regular rhythm.     Heart sounds: Normal heart sounds. No murmur heard.   Pulmonary:     Effort: Pulmonary effort is normal. No respiratory distress.     Breath sounds: Normal breath sounds. No wheezing.  Musculoskeletal:     Right lower leg: 2+ Edema present.     Left lower leg: 2+ Edema present.  Psychiatric:        Behavior: Behavior normal.         Thought  Content: Thought content normal.        Judgment: Judgment normal.           Assessment & Plan:  HTN-  SBP remains slightly above goal. She reports sbp at home 140-160. Pt is advised as follows:  Increase toprol xl from 69m to 747m Complete lab work prior to leaving (BArtist Send me HR and BP readings in 3-4 days. Follow up in 6 weeks.   This visit occurred during the SARS-CoV-2 public health emergency.  Safety protocols were in place, including screening questions prior to the visit, additional usage of staff PPE, and extensive cleaning of exam room while observing appropriate contact time as indicated for disinfecting solutions.

## 2019-09-19 NOTE — Patient Instructions (Signed)
Please increase Metoprolol 50mg  tabs to 1.5 tabs once daily. Check blood pressure and heart daily for next 3-4 days, then send me your readings via mychart.

## 2019-09-20 LAB — BASIC METABOLIC PANEL
BUN: 13 mg/dL (ref 7–25)
CO2: 28 mmol/L (ref 20–32)
Calcium: 9.7 mg/dL (ref 8.6–10.4)
Chloride: 103 mmol/L (ref 98–110)
Creat: 0.91 mg/dL (ref 0.50–0.99)
Glucose, Bld: 91 mg/dL (ref 65–99)
Potassium: 4.1 mmol/L (ref 3.5–5.3)
Sodium: 140 mmol/L (ref 135–146)

## 2019-09-22 ENCOUNTER — Other Ambulatory Visit: Payer: Self-pay | Admitting: Family

## 2019-10-31 ENCOUNTER — Ambulatory Visit (INDEPENDENT_AMBULATORY_CARE_PROVIDER_SITE_OTHER): Payer: Medicare Other | Admitting: Family

## 2019-10-31 ENCOUNTER — Ambulatory Visit: Payer: Medicare Other | Admitting: Family

## 2019-10-31 ENCOUNTER — Encounter: Payer: Self-pay | Admitting: Family

## 2019-10-31 ENCOUNTER — Other Ambulatory Visit: Payer: Self-pay

## 2019-10-31 VITALS — BP 138/54 | HR 89 | Temp 98.2°F | Resp 16 | Wt 187.0 lb

## 2019-10-31 DIAGNOSIS — M5416 Radiculopathy, lumbar region: Secondary | ICD-10-CM

## 2019-10-31 DIAGNOSIS — R110 Nausea: Secondary | ICD-10-CM | POA: Diagnosis not present

## 2019-10-31 DIAGNOSIS — Z23 Encounter for immunization: Secondary | ICD-10-CM

## 2019-10-31 DIAGNOSIS — E119 Type 2 diabetes mellitus without complications: Secondary | ICD-10-CM

## 2019-10-31 DIAGNOSIS — D509 Iron deficiency anemia, unspecified: Secondary | ICD-10-CM | POA: Diagnosis not present

## 2019-10-31 NOTE — Progress Notes (Signed)
Subjective:    Patient ID: Cassidy Harrell, female    DOB: 1950-10-10, 69 y.o.   MRN: [id number]  HPI  Patient is a 69 yr old female who presents today for follow up.  HTN-  Maintained on metoprolol, lisinopril, hctz.    BP Readings from Last 3 Encounters:  10/31/19 (!) 138/54  09/19/19 (!) 147/56  09/09/19 (!) 154/80   DM2-diet controlled. Lab Results  Component Value Date   HGBA1C 6.2 (H) 06/25/2019   HGBA1C 7.1 (H) 11/05/2018   HGBA1C 6.3 (H) 02/08/2018   Lab Results  Component Value Date   MICROALBUR 0.7 09/16/2014   LDLCALC 13 10/03/2016   CREATININE 0.91 09/19/2019     GERD- maintained on PPI. Reports that she still gets nauseated with belching.  Denies any changes in bowels.  Reports some fatigue.    Reports appetite is variable.  Wt Readings from Last 3 Encounters:  10/31/19 187 lb (84.8 kg)  09/19/19 184 lb (83.5 kg)  09/09/19 184 lb (83.5 kg)   Reports some burning pain down the left leg.      Review of Systems See HPI  Past Medical History:  Diagnosis Date  . Anemia   . Arthritis   . Depression   . Diabetes mellitus without complication ([place])   . Dysrhythmia    Over 10 years ago  . Gum disease   . Heart murmur   . History of syphilis   . Hypertension   . Irregular heart beat   . Personal history of colonic polyps   . Phlebitis   . Pre-diabetes      Social History   Socioeconomic History  . Marital status: Divorced    Spouse name: Not on file  . Number of children: 1  . Years of education: Not on file  . Highest education level: Not on file  Occupational History  . Not on file  Tobacco Use  . Smoking status: Former Smoker    Types: Cigarettes    Quit date: 2011    Years since quitting: 10.6  . Smokeless tobacco: Never Used  Vaping Use  . Vaping Use: Never used  Substance and Sexual Activity  . Alcohol use: No  . Drug use: No  . Sexual activity: Not on file  Other Topics Concern  . 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 Difficulty of Paying Living Expenses: Not very hard  Food Insecurity: No Food Insecurity  . Worried About Charity fundraiser in the Last Year: Never true  . Ran Out of Food in the Last Year: Never true  Transportation Needs: No Transportation Needs  . Lack of Transportation (Medical): No  . Lack of Transportation (Non-Medical): No  Physical Activity:   . Days of Exercise per Week: Not on file  . Minutes of Exercise per Session: Not on file  Stress:   . Feeling of Stress : Not on file  Social Connections:   .  Frequency of Communication with Friends and Family: Not on file  . Frequency of Social Gatherings with Friends and Family: Not on file  . Attends Religious Services: Not on file  . Active Member of Clubs or Organizations: Not on file  . Attends Archivist Meetings: Not on file  . Marital Status: Not on file  Intimate Partner Violence:   . Fear of Current or Ex-Partner: Not on file  . Emotionally Abused: Not on file  . Physically Abused: Not on file  . Sexually Abused: Not on file    Past Surgical History:  Procedure Laterality Date  . IR RADIOLOGIST EVAL & MGMT  04/15/2019  . IR RADIOLOGIST EVAL & MGMT  04/29/2019  . IR RADIOLOGIST EVAL & MGMT  05/08/2019  . LAPAROSCOPIC APPENDECTOMY N/A 06/30/2019   Procedure: APPENDECTOMY LAPAROSCOPIC;  Surgeon: Ralene Ok, MD;  Location: [facility];  Service: General;  Laterality: N/A;  . SHOULDER ARTHROSCOPY Right 12/2015  . SPINE SURGERY  08/20/2015   bulging / herniated disc in C-spine   . THERAPEUTIC ABORTION  1970. 1974, 1978    Family History  Problem Relation Age of Onset  . Diabetes Mother   . Cancer Father        lung cancer  . Kidney disease Father   . Seizures Father   . Cancer Brother        colon  . Heart attack Brother   . Stroke Brother   . Colon cancer Neg Hx   . Stomach cancer Neg Hx     Allergies  Allergen Reactions  . Aldactone [Spironolactone] Other (See Comments)    itching    Current Outpatient Medications on File Prior to Visit  Medication Sig Dispense Refill  . acetaminophen (TYLENOL) 650 MG CR tablet Take 650 mg by mouth every 8 (eight) hours as needed for pain.    Marland Kitchen aspirin EC 81 MG tablet Take 81 mg by mouth daily with lunch.    . blood glucose meter kit and supplies KIT Dispense based on patient and insurance preference. Use up to four times daily as directed. (FOR ICD-9 250.00, 250.01). 1 each 0  . calcium carbonate (OSCAL) 1500 (600 Ca) MG TABS tablet Take 1,200 mg by mouth daily with lunch. 800 units vitamin D    . ferrous sulfate 325 (65 FE) MG EC tablet Take 325 mg by mouth daily with lunch.    . hydrochlorothiazide (HYDRODIURIL) 25 MG tablet Take 1 tablet (25 mg total) by mouth daily. 30 tablet 3  . lisinopril (ZESTRIL) 40 MG tablet TAKE 1 TABLET BY MOUTH  DAILY (Patient taking differently: Take 40 mg by mouth daily with lunch. ) 90 tablet 3  . meloxicam (MOBIC) 7.5 MG tablet TAKE 1 TABLET BY MOUTH  DAILY 90 tablet 1  . metoprolol succinate (TOPROL-XL) 50 MG 24 hr tablet Take 1.5 tablets (75 mg total) by mouth daily. Take with or immediately following a meal. 135 tablet 1  . pantoprazole (PROTONIX) 40 MG tablet Take 1 tablet (40 mg total) by mouth daily. 30 tablet 3   No current facility-administered medications on file prior to visit.    BP (!) 138/54 (BP Location: Right Arm, Patient Position: Sitting, Cuff Size: Large)   Pulse 89   Temp 98.2 F (36.8 C) (Oral)   Resp 16   Wt 187 lb (84.8 kg)   SpO2 100%   BMI 33.13  kg/m       Objective:   Physical Exam Constitutional:  Appearance: She is well-developed.  Neck:     Thyroid: No thyromegaly.  Cardiovascular:     Rate and Rhythm: Normal rate and regular rhythm.     Heart sounds: Normal heart sounds. No murmur heard.   Pulmonary:     Effort: Pulmonary effort is normal. No respiratory distress.     Breath sounds: Normal breath sounds. No wheezing.  Musculoskeletal:     Cervical back: Neck supple.  Skin:    General: Skin is warm and dry.  Neurological:     Mental Status: She is alert and oriented to person, place, and time.  Psychiatric:        Behavior: Behavior normal.        Thought Content: Thought content normal.        Judgment: Judgment normal.           Assessment & Plan:  HTN- BP at goal.  Continue current medications.  DM2- clinically stable. Obtain follow up 1C.  Chronic nausea- will refer to GI for further evaluation.  Lumbar radiculopathy- (left) advised pt to schedule follow up with her neurosurgeon.  This visit occurred during the SARS-CoV-2 public health emergency.  Safety protocols were in place, including screening questions prior to the visit, additional usage of staff PPE, and extensive cleaning of exam room while observing appropriate contact time as indicated for disinfecting solutions.

## 2019-10-31 NOTE — Patient Instructions (Signed)
Please schedule follow up with Dr. Ellene Route for the burning pain in your leg.

## 2019-11-04 ENCOUNTER — Telehealth: Payer: Self-pay | Admitting: Family

## 2019-11-05 ENCOUNTER — Other Ambulatory Visit: Payer: Self-pay

## 2019-11-05 ENCOUNTER — Other Ambulatory Visit (INDEPENDENT_AMBULATORY_CARE_PROVIDER_SITE_OTHER): Payer: Medicare Other

## 2019-11-05 ENCOUNTER — Encounter: Payer: Self-pay | Admitting: Nurse Practitioner

## 2019-11-05 DIAGNOSIS — D509 Iron deficiency anemia, unspecified: Secondary | ICD-10-CM | POA: Diagnosis not present

## 2019-11-05 DIAGNOSIS — E119 Type 2 diabetes mellitus without complications: Secondary | ICD-10-CM | POA: Diagnosis not present

## 2019-11-05 NOTE — Addendum Note (Signed)
Addended by: Kelle Darting A on: 11/05/2019 01:55 PM   Modules accepted: Orders

## 2019-11-06 LAB — CBC WITH DIFFERENTIAL/PLATELET
Absolute Monocytes: 366 cells/uL (ref 200–950)
Basophils Absolute: 18 cells/uL (ref 0–200)
Basophils Relative: 0.3 %
Eosinophils Absolute: 537 cells/uL — ABNORMAL HIGH (ref 15–500)
Eosinophils Relative: 9.1 %
HCT: 36.8 % (ref 35.0–45.0)
Hemoglobin: 12.2 g/dL (ref 11.7–15.5)
Lymphs Abs: 2095 cells/uL (ref 850–3900)
MCH: 27.9 pg (ref 27.0–33.0)
MCHC: 33.2 g/dL (ref 32.0–36.0)
MCV: 84.2 fL (ref 80.0–100.0)
MPV: 12.3 fL (ref 7.5–12.5)
Monocytes Relative: 6.2 %
Neutro Abs: 2885 cells/uL (ref 1500–7800)
Neutrophils Relative %: 48.9 %
Platelets: 204 10*3/uL (ref 140–400)
RBC: 4.37 10*6/uL (ref 3.80–5.10)
RDW: 13.8 % (ref 11.0–15.0)
Total Lymphocyte: 35.5 %
WBC: 5.9 10*3/uL (ref 3.8–10.8)

## 2019-11-06 LAB — COMPREHENSIVE METABOLIC PANEL
AG Ratio: 1.5 (calc) (ref 1.0–2.5)
ALT: 18 U/L (ref 6–29)
AST: 14 U/L (ref 10–35)
Albumin: 4.2 g/dL (ref 3.6–5.1)
Alkaline phosphatase (APISO): 67 U/L (ref 37–153)
BUN: 16 mg/dL (ref 7–25)
CO2: 25 mmol/L (ref 20–32)
Calcium: 9 mg/dL (ref 8.6–10.4)
Chloride: 102 mmol/L (ref 98–110)
Creat: 0.89 mg/dL (ref 0.50–0.99)
Globulin: 2.8 g/dL (calc) (ref 1.9–3.7)
Glucose, Bld: 171 mg/dL — ABNORMAL HIGH (ref 65–99)
Potassium: 3.7 mmol/L (ref 3.5–5.3)
Sodium: 138 mmol/L (ref 135–146)
Total Bilirubin: 0.3 mg/dL (ref 0.2–1.2)
Total Protein: 7 g/dL (ref 6.1–8.1)

## 2019-11-06 LAB — HEMOGLOBIN A1C
Hgb A1c MFr Bld: 6.6 % of total Hgb — ABNORMAL HIGH (ref <5.7)
Mean Plasma Glucose: 143 (calc)
eAG (mmol/L): 7.9 (calc)

## 2019-12-04 ENCOUNTER — Encounter: Payer: Self-pay | Admitting: Nurse Practitioner

## 2019-12-04 ENCOUNTER — Ambulatory Visit (INDEPENDENT_AMBULATORY_CARE_PROVIDER_SITE_OTHER): Payer: Medicare Other | Admitting: Nurse Practitioner

## 2019-12-04 ENCOUNTER — Other Ambulatory Visit: Payer: Self-pay | Admitting: Nurse Practitioner

## 2019-12-04 VITALS — BP 150/70 | HR 120 | Temp 98.1°F | Ht 63.0 in | Wt 188.4 lb

## 2019-12-04 DIAGNOSIS — K5900 Constipation, unspecified: Secondary | ICD-10-CM

## 2019-12-04 DIAGNOSIS — R1030 Lower abdominal pain, unspecified: Secondary | ICD-10-CM

## 2019-12-04 DIAGNOSIS — R142 Eructation: Secondary | ICD-10-CM

## 2019-12-04 DIAGNOSIS — R140 Abdominal distension (gaseous): Secondary | ICD-10-CM

## 2019-12-04 DIAGNOSIS — R1310 Dysphagia, unspecified: Secondary | ICD-10-CM

## 2019-12-04 MED ORDER — FAMOTIDINE 20 MG PO TABS: 20.0000 mg | ORAL_TABLET | Freq: Every day | ORAL | 1 refills | Status: DC

## 2019-12-04 MED ORDER — SUPREP BOWEL PREP KIT 17.5-3.13-1.6 GM/177ML PO SOLN: 1.0000 | Freq: Once | ORAL | 0 refills | Status: AC

## 2019-12-04 NOTE — Patient Instructions (Addendum)
You have been scheduled for an endoscopy and colonoscopy. Please follow the written instructions given to you at your visit today. Please pick up your prep supplies at the pharmacy within the next 1-3 days. If you use inhalers (even only as needed), please bring them with you on the day of your procedure.   Continue Pantoprazole  We will send Famotidine to your pharmacy  Take Miralax every night at bedtime  Call the office [phone] if symptoms worsen   Due to recent changes in healthcare laws, you may see the results of your imaging and laboratory studies on MyChart before your provider has had a chance to review them.  We understand that in some cases there may be results that are confusing or concerning to you. Not all laboratory results come back in the same time frame and the provider may be waiting for multiple results in order to interpret others.  Please give [place] 48 hours in order for your provider to thoroughly review all the results before contacting the office for clarification of your results.   I appreciate the  opportunity to care for you  Thank You   Ileana Ladd

## 2019-12-04 NOTE — Progress Notes (Signed)
12/05/2019 GLORIE Harrell [id number] [birth year]   CHIEF COMPLAINT: abdominal bloat, lower abdominal pain   HISTORY OF PRESENT ILLNESS:  Cassidy Harrell is a 69 year old female with a past medical history of arthritis, alcoholism abstinent since 2007, depression, hypertension, "irregular heart beat", DM II, anemia and colon polyps. S/P appendectomy for a perforated appendix 06/2019, pathology report consistent with acute appendicitis, no evidence of malignancy.  She presents to our office today as referred by [name] for further evaluation for nausea and lower abdominal pain. She reports having nausea without vomiting which started shortly after her appendectomy surgery. She also feels her abdominal bloat and lower abdominal pain RLQ > LLQ are similar to the pain she had prior to her appendectomy. No severe abdominal pain. She has some constipation and takes Miralax once or twice within 2 weeks. She typically passes a normal formed BM QOD. She has increased burping. Her sensation of taste has diminished. No heartburn. She vaguely describes having dysphagia, foods such as meat feels like it gets briefly stuck in the upper esophagus or possibly passes down the esophagus a bit slower. She is able to drink water during these times without difficulty. These episodes occur once weekly for the past 10 years. She is taking Pantoprazole 5m QD for the past 4 months as prescribed by her PCP. She takes ASA 835mdaily. She takes Meloxicam 7.5m8maily for back pain. She's never had an EGD. She underwent a colonoscopy 12/20/2011 by Dr. JacArdis Hughsich showed a small polyp which was removed  and external hemorrhoids. Biopsies showed benign polypoid mucosa. She was advised to repeat a colonoscopy in 5 years which was not done. Brother with history of colon cancer. No fever, sweats or chills. No weight loss. She is on Ferrous Sulfate 325m40mce daily x "10 years"  for anemia.   Abd/pelvic CT  With contrast  06/03/2019: 1. There are persistent but improved inflammatory changes about the appendix. The appendix remains dilated, especially at the tip where it measures approximately 1.3 cm. There is no evidence for extraluminal air or an adjacent abscess. 2. There is a punctate nonobstructing stone in the upper pole the right kidney The pancreas was normal, Normal contours without ductal dilatation. No peripancreatic fluid collection.   CBC Latest Ref Rng & Units 11/05/2019 06/25/2019 04/05/2019  WBC 3.8 - 10.8 Thousand/uL 5.9 5.2 8.5  Hemoglobin 11.7 - 15.5 g/dL 12.2 13.2 11.3(L)  Hematocrit 35 - 45 % 36.8 42.2 36.0  Platelets 140 - 400 Thousand/uL 204 220 243    CMP Latest Ref Rng & Units 11/05/2019 09/19/2019 06/25/2019  Glucose 65 - 99 mg/dL 171(H) 91 104(H)  BUN 7 - 25 mg/dL '16 13 10  ' Creatinine 0.50 - 0.99 mg/dL 0.89 0.91 0.69  Sodium 135 - 146 mmol/L 138 140 143  Potassium 3.5 - 5.3 mmol/L 3.7 4.1 3.8  Chloride 98 - 110 mmol/L 102 103 108  CO2 20 - 32 mmol/L '25 28 24  ' Calcium 8.6 - 10.4 mg/dL 9.0 9.7 9.3  Total Protein 6.1 - 8.1 g/dL 7.0 - -  Total Bilirubin 0.2 - 1.2 mg/dL 0.3 - -  Alkaline Phos 38 - 126 U/L - - -  AST 10 - 35 U/L 14 - -  ALT 6 - 29 U/L 18 - -     Past Medical History:  Diagnosis Date  . Anemia   . Arthritis   . Depression   . Diabetes mellitus without complication ([facility])[place].  Dysrhythmia    Over 10 years ago  . Gum disease   . Heart murmur   . History of syphilis   . Hypertension   . Irregular heart beat   . Personal history of colonic polyps   . Phlebitis   . Pre-diabetes    Past Surgical History:  Procedure Laterality Date  . APPENDECTOMY    . CYSTECTOMY     between breasts  . IR RADIOLOGIST EVAL & MGMT  04/15/2019  . IR RADIOLOGIST EVAL & MGMT  04/29/2019  . IR RADIOLOGIST EVAL & MGMT  05/08/2019  . LAPAROSCOPIC APPENDECTOMY N/A 06/30/2019   Procedure: APPENDECTOMY LAPAROSCOPIC;  Surgeon: Ralene Ok, MD;  Location: [facility];  Service: General;   Laterality: N/A;  . SHOULDER ARTHROSCOPY Right 12/2015  . SPINE SURGERY  08/20/2015   bulging / herniated disc in C-spine  . THERAPEUTIC ABORTION  1970. 1974, 1978   Social History: She is retired. She smoked 1ppd x 40+ yeas quit in 2011. History of alcoholism, no alcohol in 2007. Past marijuana use.   Family history: Mother 43 with CHF.  Father died age 80 with history of seizure disorder, ? Cancer. Brother with history of colon cancer. Brother with MI and CVA.    Allergies  Allergen Reactions  . Aldactone [Spironolactone] Other (See Comments)    itching      Outpatient Encounter Medications as of 12/04/2019  Medication Sig  . acetaminophen (TYLENOL) 650 MG CR tablet Take 650 mg by mouth every 8 (eight) hours as needed for pain.  Marland Kitchen aspirin EC 81 MG tablet Take 81 mg by mouth daily with lunch.  . blood glucose meter kit and supplies KIT Dispense based on patient and insurance preference. Use up to four times daily as directed. (FOR ICD-9 250.00, 250.01).  . calcium carbonate (OSCAL) 1500 (600 Ca) MG TABS tablet Take 1,200 mg by mouth daily with lunch. 800 units vitamin D  . ferrous sulfate 325 (65 FE) MG EC tablet Take 325 mg by mouth daily with lunch.  . hydrochlorothiazide (HYDRODIURIL) 25 MG tablet Take 1 tablet (25 mg total) by mouth daily.  Marland Kitchen lisinopril (ZESTRIL) 40 MG tablet TAKE 1 TABLET BY MOUTH  DAILY (Patient taking differently: Take 40 mg by mouth daily with lunch. )  . meloxicam (MOBIC) 7.5 MG tablet TAKE 1 TABLET BY MOUTH  DAILY  . metoprolol succinate (TOPROL-XL) 50 MG 24 hr tablet Take 1.5 tablets (75 mg total) by mouth daily. Take with or immediately following a meal. (Patient taking differently: Take 50 mg by mouth daily. Take with or immediately following a meal.)  . metoprolol tartrate (LOPRESSOR) 25 MG tablet Take 25 mg by mouth daily.  . pantoprazole (PROTONIX) 40 MG tablet Take 1 tablet (40 mg total) by mouth daily.  . [EXPIRED] Na Sulfate-K Sulfate-Mg Sulf (SUPREP  BOWEL PREP KIT) 17.5-3.13-1.6 GM/177ML SOLN Take 1 kit by mouth once for 1 dose.  . [DISCONTINUED] famotidine (PEPCID) 20 MG tablet Take 1 tablet (20 mg total) by mouth at bedtime. For burping   No facility-administered encounter medications on file as of 12/04/2019.     REVIEW OF SYSTEMS:  Gen: + night sweats x 2 to 3 years. No weight loss.  CV: + ankle edema. Denies chest pain.  Resp: Denies cough, shortness of breath of hemoptysis.  GI: See HPI.  GU : Denies urinary burning, blood in urine, increased urinary frequency or incontinence. MS: + back pain. Arthritis.  Derm: Denies rash, itchiness, skin lesions or  unhealing ulcers. Psych: Denies depression, anxiety, memory loss, suicidal ideation and confusion. Heme: Denies bruising, bleeding. Neuro:  Denies headaches, dizziness or paresthesias. Endo:  Denies any problems with DM, thyroid or adrenal function.  PHYSICAL EXAM: BP (!) 150/70   Pulse (!) 120   Temp 98.1 F (36.7 C)   Ht '5\' 3"'  (1.6 m)   Wt 188 lb 6.4 oz (85.5 kg)   BMI 33.37 kg/m   Repeat heart rate 100 b/min per auscultation.  General: Well developed 69 year old female in no acute distress. Head: Normocephalic and atraumatic. Eyes:  Sclerae non-icteric, conjunctive pink. Ears: Normal auditory acuity. Mouth: Dentition intact. No ulcers or lesions.  Neck: Supple, no lymphadenopathy or thyromegaly.  Lungs: Clear bilaterally to auscultation without wheezes, crackles or rhonchi. Heart: Tachycardic, HR 100. Regular rate and rhythm. No murmur, rub or gallop appreciated.  Abdomen: Soft, nontender, non distended. No masses. No hepatosplenomegaly. Normoactive bowel sounds x 4 quadrants.  Rectal: Deferred.  Musculoskeletal: Symmetrical with no gross deformities. Skin: Warm and dry. No rash or lesions on visible extremities. Extremities: Ankle edema R > L.  Neurological: Alert oriented x 4, no focal deficits.  Psychological:  Alert and cooperative. Normal mood and  affect.  ASSESSMENT AND PLAN:  29. 69 year old female with  lower abdominal pain and bloat which has persisted since her appendectomy 06/2019 (appendix path report was benign). No evidence of a cecal mass on CTAP 05/2019. Negative abdominal exam today. -Discussed scheduling an abd/pelvic CT scan if her symptoms worsen and if colonoscopy negative. Repeat labs if symptoms worsen.  -Colonoscopy  benefits and risks discussed including risk with sedation, risk of bleeding, perforation and infection   2. Constipation -Miralax Q HS  3. Nausea.  -Continue Protonix.  -Patient declines antiemetic at this time   4. Dysphagia. Increased burping.  -Continue Protonix 58m QD. Add Famotidine 275mQHS -EGD at time of colonoscopy. EGD benefits and risks discussed including risk with sedation, risk of bleeding, perforation and infection   5. Family history of colon cancer (brother) -See plan in # 1  6.77History of IDA on Ferrous Sulfate x 10 years. Hg 12.2. -EGD and colonoscopy as ordered above   7. Hepatic cysts per CTAP 06/03/2019  Further follow up to be determined after the above evaluation completed         CC:  O'Debbrah AlarNP

## 2019-12-05 ENCOUNTER — Encounter: Payer: Self-pay | Admitting: Nurse Practitioner

## 2019-12-05 DIAGNOSIS — M48062 Spinal stenosis, lumbar region with neurogenic claudication: Secondary | ICD-10-CM | POA: Diagnosis not present

## 2019-12-05 NOTE — Progress Notes (Signed)
I agree with the above note, plan 

## 2019-12-10 ENCOUNTER — Encounter: Payer: Self-pay | Admitting: Family

## 2019-12-17 ENCOUNTER — Other Ambulatory Visit: Payer: Self-pay | Admitting: *Deleted

## 2019-12-17 MED ORDER — PANTOPRAZOLE SODIUM 40 MG PO TBEC
40.0000 mg | DELAYED_RELEASE_TABLET | Freq: Every day | ORAL | 3 refills | Status: DC
Start: 2019-12-17 — End: 2020-03-11

## 2019-12-18 DIAGNOSIS — M48062 Spinal stenosis, lumbar region with neurogenic claudication: Secondary | ICD-10-CM | POA: Diagnosis not present

## 2019-12-18 DIAGNOSIS — M545 Low back pain, unspecified: Secondary | ICD-10-CM | POA: Diagnosis not present

## 2019-12-23 DIAGNOSIS — I10 Essential (primary) hypertension: Secondary | ICD-10-CM | POA: Diagnosis not present

## 2019-12-23 DIAGNOSIS — M48062 Spinal stenosis, lumbar region with neurogenic claudication: Secondary | ICD-10-CM | POA: Diagnosis not present

## 2019-12-30 ENCOUNTER — Other Ambulatory Visit: Payer: Self-pay

## 2019-12-30 ENCOUNTER — Encounter: Payer: Self-pay | Admitting: Gastroenterology

## 2019-12-30 ENCOUNTER — Ambulatory Visit (AMBULATORY_SURGERY_CENTER): Payer: Medicare Other | Admitting: Gastroenterology

## 2019-12-30 VITALS — BP 126/46 | HR 64 | Temp 97.3°F | Resp 21 | Ht 63.0 in | Wt 188.0 lb

## 2019-12-30 DIAGNOSIS — D120 Benign neoplasm of cecum: Secondary | ICD-10-CM

## 2019-12-30 DIAGNOSIS — D122 Benign neoplasm of ascending colon: Secondary | ICD-10-CM

## 2019-12-30 DIAGNOSIS — R1030 Lower abdominal pain, unspecified: Secondary | ICD-10-CM

## 2019-12-30 DIAGNOSIS — I10 Essential (primary) hypertension: Secondary | ICD-10-CM | POA: Diagnosis not present

## 2019-12-30 DIAGNOSIS — R140 Abdominal distension (gaseous): Secondary | ICD-10-CM

## 2019-12-30 DIAGNOSIS — K2970 Gastritis, unspecified, without bleeding: Secondary | ICD-10-CM

## 2019-12-30 DIAGNOSIS — R1310 Dysphagia, unspecified: Secondary | ICD-10-CM

## 2019-12-30 DIAGNOSIS — D125 Benign neoplasm of sigmoid colon: Secondary | ICD-10-CM | POA: Diagnosis not present

## 2019-12-30 DIAGNOSIS — D124 Benign neoplasm of descending colon: Secondary | ICD-10-CM | POA: Diagnosis not present

## 2019-12-30 DIAGNOSIS — K5900 Constipation, unspecified: Secondary | ICD-10-CM

## 2019-12-30 DIAGNOSIS — K319 Disease of stomach and duodenum, unspecified: Secondary | ICD-10-CM

## 2019-12-30 MED ORDER — SODIUM CHLORIDE 0.9 % IV SOLN: 500.0000 mL | Freq: Once | INTRAVENOUS | Status: DC

## 2019-12-30 NOTE — Progress Notes (Signed)
Called to room to assist during endoscopic procedure.  Patient ID and intended procedure confirmed with present staff. Received instructions for my participation in the procedure from the performing physician.  

## 2019-12-30 NOTE — Progress Notes (Signed)
Pt's states no medical or surgical changes since previsit or office visit.  CW - vitals 

## 2019-12-30 NOTE — Op Note (Signed)
[facility] Patient Name: Cassidy Harrell Procedure Date: 12/30/2019 2:25 PM MRN: [id number] Endoscopist: Milus Banister , MD Age: 69 Referring MD:  Date of Birth: [birth year] Gender: Female Account #: [id number] Procedure:                Upper GI endoscopy Indications:              Dysphagia, bloating Medicines:                Monitored Anesthesia Care Procedure:                Pre-Anesthesia Assessment:                           - Prior to the procedure, a History and Physical                            was performed, and patient medications and                            allergies were reviewed. The patient's tolerance of                            previous anesthesia was also reviewed. The risks                            and benefits of the procedure and the sedation                            options and risks were discussed with the patient.                            All questions were answered, and informed consent                            was obtained. Prior Anticoagulants: The patient has                            taken no previous anticoagulant or antiplatelet                            agents. ASA Grade Assessment: II - A patient with                            mild systemic disease. After reviewing the risks                            and benefits, the patient was deemed in                            satisfactory condition to undergo the procedure.                           After obtaining informed consent, the endoscope was  passed under direct vision. Throughout the                            procedure, the patient's blood pressure, pulse, and                            oxygen saturations were monitored continuously. The                            Endoscope was introduced through the mouth, and                            advanced to the second part of duodenum. The upper                            GI endoscopy was accomplished  without difficulty.                            The patient tolerated the procedure well. Scope In: Scope Out: Findings:                 Non-specific, mild inflammation characterized by                            erythema was found in the gastric body and antrum.                            Biopsies were taken with a cold forceps for                            histology. jar 1.                           Linear erythema in a spoke like fashion in the                            gastric antrum, ?GAVE. Biopsied. Jar 2.                           The exam was otherwise without abnormality. Complications:            No immediate complications. Estimated blood loss:                            None. Estimated Blood Loss:     Estimated blood loss: none. Impression:               - Non-specific gastritis. Biopsied to check for H.                            pylori.                           - Linear erythema in a spoke like fashion in the  gastric antrum, ?GAVE. Biopsied.                           - The examination was otherwise normal. Recommendation:           - Patient has a contact number available for                            emergencies. The signs and symptoms of potential                            delayed complications were discussed with the                            patient. Return to normal activities tomorrow.                            Written discharge instructions were provided to the                            patient.                           - Resume previous diet.                           - Continue present medications.                           - Await pathology results. Milus Banister, MD 12/30/2019 2:52:07 PM This report has been signed electronically.

## 2019-12-30 NOTE — Progress Notes (Signed)
Report to PACU, RN, vss, BBS= Clear.  

## 2019-12-30 NOTE — Patient Instructions (Signed)
Upper Endoscopy:  Handout on Gastritis given to you today  Await biopsy results     Colonoscopy:  Handout on polyps given to you today  Await pathology results on polyps removed      YOU HAD AN ENDOSCOPIC PROCEDURE TODAY AT [place] [facility]:   Refer to the procedure report that was given to you for any specific questions about what was found during the examination.  If the procedure report does not answer your questions, please call your gastroenterologist to clarify.  If you requested that your care partner not be given the details of your procedure findings, then the procedure report has been included in a sealed envelope for you to review at your convenience later.  YOU SHOULD EXPECT: Some feelings of bloating in the abdomen. Passage of more gas than usual.  Walking can help get rid of the air that was put into your GI tract during the procedure and reduce the bloating. If you had a lower endoscopy (such as a colonoscopy or flexible sigmoidoscopy) you may notice spotting of blood in your stool or on the toilet paper. If you underwent a bowel prep for your procedure, you may not have a normal bowel movement for a few days.  Please Note:  You might notice some irritation and congestion in your nose or some drainage.  This is from the oxygen used during your procedure.  There is no need for concern and it should clear up in a day or so.  SYMPTOMS TO REPORT IMMEDIATELY:   Following lower endoscopy (colonoscopy or flexible sigmoidoscopy):  Excessive amounts of blood in the stool  Significant tenderness or worsening of abdominal pains  Swelling of the abdomen that is new, acute  Fever of 100F or higher   Following upper endoscopy (EGD)  Vomiting of blood or coffee ground material  New chest pain or pain under the shoulder blades  Painful or persistently difficult swallowing  New shortness of breath  Fever of 100F or higher  Black, tarry-looking stools  For  urgent or emergent issues, a gastroenterologist can be reached at any hour by calling [phone]. Do not use MyChart messaging for urgent concerns.    DIET:  We do recommend a small meal at first, but then you may proceed to your regular diet.  Drink plenty of fluids but you should avoid alcoholic beverages for 24 hours.  ACTIVITY:  You should plan to take it easy for the rest of today and you should NOT DRIVE or use heavy machinery until tomorrow (because of the sedation medicines used during the test).    FOLLOW UP: Our staff will call the number listed on your records 48-72 hours following your procedure to check on you and address any questions or concerns that you may have regarding the information given to you following your procedure. If we do not reach you, we will leave a message.  We will attempt to reach you two times.  During this call, we will ask if you have developed any symptoms of COVID 19. If you develop any symptoms (ie: fever, flu-like symptoms, shortness of breath, cough etc.) before then, please call [phone].  If you test positive for Covid 19 in the 2 weeks post procedure, please call and report this information to [place].    If any biopsies were taken you will be contacted by phone or by letter within the next 1-3 weeks.  Please call us at [phone] if you have not heard  about the biopsies in 3 weeks.    SIGNATURES/CONFIDENTIALITY: You and/or your care partner have signed paperwork which will be entered into your electronic medical record.  These signatures attest to the fact that that the information above on your After Visit Summary has been reviewed and is understood.  Full responsibility of the confidentiality of this discharge information lies with you and/or your care-partner.

## 2019-12-30 NOTE — Op Note (Signed)
[facility] Patient Name: Cassidy Harrell Procedure Date: 12/30/2019 2:25 PM MRN: [id number] Endoscopist: Milus Banister , MD Age: 69 Referring MD:  Date of Birth: 05-16-1950 Gender: Female Account #: [id number] Procedure:                Colonoscopy Indications:              Generalized abdominal pain, brother had colon                            cancer in his 63s Medicines:                Monitored Anesthesia Care Procedure:                Pre-Anesthesia Assessment:                           - Prior to the procedure, a History and Physical                            was performed, and patient medications and                            allergies were reviewed. The patient's tolerance of                            previous anesthesia was also reviewed. The risks                            and benefits of the procedure and the sedation                            options and risks were discussed with the patient.                            All questions were answered, and informed consent                            was obtained. Prior Anticoagulants: The patient has                            taken no previous anticoagulant or antiplatelet                            agents. ASA Grade Assessment: II - A patient with                            mild systemic disease. After reviewing the risks                            and benefits, the patient was deemed in                            satisfactory condition to undergo the procedure.  After obtaining informed consent, the colonoscope                            was passed under direct vision. Throughout the                            procedure, the patient's blood pressure, pulse, and                            oxygen saturations were monitored continuously. The                            Colonoscope was introduced through the anus and                            advanced to the the cecum, identified by                             appendiceal orifice and ileocecal valve. The                            colonoscopy was performed without difficulty. The                            patient tolerated the procedure well. The quality                            of the bowel preparation was good. The ileocecal                            valve, appendiceal orifice, and rectum were                            photographed. Scope In: 2:30:30 PM Scope Out: 2:39:10 PM Scope Withdrawal Time: 0 hours 7 minutes 0 seconds  Total Procedure Duration: 0 hours 8 minutes 40 seconds  Findings:                 Five sessile polyps were found in the sigmoid                            colon, descending colon, ascending colon and cecum.                            The polyps were 1 to 5 mm in size. These polyps                            were removed with a cold snare. Resection and                            retrieval were complete.                           The exam was otherwise without abnormality on  direct and retroflexion views. Complications:            No immediate complications. Estimated blood loss:                            None. Estimated Blood Loss:     Estimated blood loss: none. Impression:               - Five 1 to 5 mm polyps in the sigmoid colon, in                            the descending colon, in the ascending colon and in                            the cecum, removed with a cold snare. Resected and                            retrieved.                           - The examination was otherwise normal on direct                            and retroflexion views. Recommendation:           - EGD now.                           - Await pathology results. Milus Banister, MD 12/30/2019 2:48:33 PM This report has been signed electronically.

## 2019-12-31 ENCOUNTER — Encounter: Payer: Self-pay | Admitting: Family

## 2019-12-31 MED ORDER — HYDROCHLOROTHIAZIDE 25 MG PO TABS
25.0000 mg | ORAL_TABLET | Freq: Every day | ORAL | 1 refills | Status: DC
Start: 2019-12-31 — End: 2020-06-07

## 2020-01-01 ENCOUNTER — Telehealth: Payer: Self-pay

## 2020-01-01 NOTE — Telephone Encounter (Signed)
  Follow up Call-  Call back number 12/30/2019  Post procedure Call Back phone  # [phone]  Permission to leave phone message Yes  Some recent data might be hidden     Patient questions:  Do you have a fever, pain , or abdominal swelling? No. Pain Score  0 *  Have you tolerated food without any problems? Yes.    Have you been able to return to your normal activities? Yes.    Do you have any questions about your discharge instructions: Diet   No. Medications  No. Follow up visit  No.  Do you have questions or concerns about your Care? No.  Actions: * If pain score is 4 or above: No action needed, pain <4.  Have you developed a fever since your procedure? No 2.   Have you had an respiratory symptoms (SOB or cough) since your procedure? No  3.   Have you tested positive for COVID 19 since your procedure No  4.   Have you had any family members/close contacts diagnosed with the COVID 19 since your procedure?  No   If yes to any of these questions please route to Joylene John, RN and Joella Prince, RN

## 2020-01-06 DIAGNOSIS — M48062 Spinal stenosis, lumbar region with neurogenic claudication: Secondary | ICD-10-CM | POA: Diagnosis not present

## 2020-01-06 DIAGNOSIS — M545 Low back pain, unspecified: Secondary | ICD-10-CM | POA: Diagnosis not present

## 2020-01-07 ENCOUNTER — Encounter: Payer: Self-pay | Admitting: Gastroenterology

## 2020-01-07 ENCOUNTER — Encounter: Payer: Self-pay | Admitting: Family

## 2020-01-07 DIAGNOSIS — M47814 Spondylosis without myelopathy or radiculopathy, thoracic region: Secondary | ICD-10-CM | POA: Diagnosis not present

## 2020-01-07 DIAGNOSIS — M4804 Spinal stenosis, thoracic region: Secondary | ICD-10-CM | POA: Diagnosis not present

## 2020-01-08 ENCOUNTER — Other Ambulatory Visit: Payer: Self-pay

## 2020-01-08 DIAGNOSIS — M48062 Spinal stenosis, lumbar region with neurogenic claudication: Secondary | ICD-10-CM | POA: Diagnosis not present

## 2020-01-08 DIAGNOSIS — M545 Low back pain, unspecified: Secondary | ICD-10-CM | POA: Diagnosis not present

## 2020-01-08 MED ORDER — METOPROLOL SUCCINATE ER 50 MG PO TB24
75.0000 mg | ORAL_TABLET | Freq: Every day | ORAL | 1 refills | Status: DC
Start: 2020-01-08 — End: 2020-05-28

## 2020-01-09 DIAGNOSIS — I10 Essential (primary) hypertension: Secondary | ICD-10-CM | POA: Diagnosis not present

## 2020-01-09 DIAGNOSIS — M4804 Spinal stenosis, thoracic region: Secondary | ICD-10-CM | POA: Diagnosis not present

## 2020-01-13 DIAGNOSIS — E119 Type 2 diabetes mellitus without complications: Secondary | ICD-10-CM | POA: Diagnosis not present

## 2020-01-13 DIAGNOSIS — H2513 Age-related nuclear cataract, bilateral: Secondary | ICD-10-CM | POA: Diagnosis not present

## 2020-01-13 DIAGNOSIS — H52223 Regular astigmatism, bilateral: Secondary | ICD-10-CM | POA: Diagnosis not present

## 2020-01-13 DIAGNOSIS — H524 Presbyopia: Secondary | ICD-10-CM | POA: Diagnosis not present

## 2020-01-13 LAB — HM DIABETES EYE EXAM

## 2020-01-16 ENCOUNTER — Encounter: Payer: Self-pay | Admitting: Family

## 2020-01-20 ENCOUNTER — Ambulatory Visit: Payer: Medicare Other | Admitting: Pharmacist

## 2020-01-20 DIAGNOSIS — E119 Type 2 diabetes mellitus without complications: Secondary | ICD-10-CM

## 2020-01-20 DIAGNOSIS — I10 Essential (primary) hypertension: Secondary | ICD-10-CM

## 2020-01-20 DIAGNOSIS — R110 Nausea: Secondary | ICD-10-CM

## 2020-01-20 NOTE — Chronic Care Management (AMB) (Signed)
Chronic Care Management Pharmacy  Name: Cassidy Harrell  MRN: [id number] DOB: 1951/01/02   Chief Complaint/ HPI  Cassidy Harrell,  69 y.o. , female presents for their Initial CCM visit with the clinical pharmacist via telephone due to COVID-19 Pandemic.  PCP : [name], NP  Their chronic conditions include: Diabetes, Hypertension, Pain  Office Visits: 10/31/19: Visit w/ [name], NP - Chronic nausea, referral to GI. No med changes noted.    Consult Visit: 12/24/19: Gertie [facility]ey visit w/ Carl Best, NP - Continue Protonix 54m QD, add famotidine 259mHS. Recommend EGD at time of colonoscopy   Medications: Outpatient Encounter Medications as of 01/20/2020  Medication Sig  . acetaminophen (TYLENOL) 650 MG CR tablet Take 650 mg by mouth every 8 (eight) hours as needed for pain.  . Marland Kitchenspirin EC 81 MG tablet Take 81 mg by mouth daily with lunch.  . blood glucose meter kit and supplies KIT Dispense based on patient and insurance preference. Use up to four times daily as directed. (FOR ICD-9 250.00, 250.01).  . calcium carbonate (OSCAL) 1500 (600 Ca) MG TABS tablet Take 1,200 mg by mouth daily with lunch. 800 units vitamin D  . famotidine (PEPCID) 20 MG tablet TAKE 1 TABLET(20 MG TOTAL) BY MOUTH AT BEDTIME. FOR BURPING.  . ferrous sulfate 325 (65 FE) MG EC tablet Take 325 mg by mouth daily with lunch.  . hydrochlorothiazide (HYDRODIURIL) 25 MG tablet Take 1 tablet (25 mg total) by mouth daily.  . Marland Kitchenisinopril (ZESTRIL) 40 MG tablet TAKE 1 TABLET BY MOUTH  DAILY (Patient taking differently: Take 40 mg by mouth daily with lunch. )  . meloxicam (MOBIC) 7.5 MG tablet TAKE 1 TABLET BY MOUTH  DAILY  . metoprolol succinate (TOPROL-XL) 50 MG 24 hr tablet Take 1.5 tablets (75 mg total) by mouth daily. Take with or immediately following a meal.  . pantoprazole (PROTONIX) 40 MG tablet Take 1 tablet (40 mg total) by mouth daily.   No facility-administered encounter  medications on file as of 01/20/2020.   SDOH Screenings   Alcohol Screen:   . Last Alcohol Screening Score (AUDIT): Not on file  Depression (PHQ2-9): Low Risk   . PHQ-2 Score: 0  Financial Resource Strain: Low Risk   . Difficulty of Paying Living Expenses: Not very hard  Food Insecurity: No Food Insecurity  . Worried About RuCharity fundraisern the Last Year: Never true  . Ran Out of Food in the Last Year: Never true  Housing: Low Risk   . Last Housing Risk Score: 0  Physical Activity:   . Days of Exercise per Week: Not on file  . Minutes of Exercise per Session: Not on file  Social Connections:   . Frequency of Communication with Friends and Family: Not on file  . Frequency of Social Gatherings with Friends and Family: Not on file  . Attends Religious Services: Not on file  . Active Member of Clubs or Organizations: Not on file  . Attends ClArchivisteetings: Not on file  . Marital Status: Not on file  Stress:   . Feeling of Stress : Not on file  Tobacco Use: Medium Risk  . Smoking Tobacco Use: Former Smoker  . Smokeless Tobacco Use: Never Used  Transportation Needs: No Transportation Needs  . Lack of Transportation (Medical): No  . Lack of Transportation (Non-Medical): No     Current Diagnosis/Assessment:  Goals Addressed  This Visit's Progress   . Chronic Care Management Pharmacy Care Plan       CARE PLAN ENTRY (see longitudinal plan of care for additional care plan information)  Current Barriers:  . Chronic Disease Management support, education, and care coordination needs related to Diabetes, Hypertension, Pain   Hypertension BP Readings from Last 3 Encounters:  12/30/19 (!) 126/46  12/04/19 (!) 150/70  10/31/19 (!) 138/54   . Pharmacist Clinical Goal(s): o Over the next 180 days, patient will work with PharmD and providers to maintain BP goal <140/90 . Current regimen:  o Amlodipine 63m daily o Lisinopril 444mdaily o Metoprolol  succinate 2563maily o HCTZ 50m60mily . Interventions: o Requested patient to check blood pressure 3 times per week and record . Patient self care activities - Over the next 90 days, patient will: o Check BP three times weekly, document, and provide at future appointments o Ensure daily salt intake < 2300 mg/Mantaj Chamberlin  Diabetes Lab Results  Component Value Date/Time   HGBA1C 6.6 (H) 11/05/2019 01:55 PM   HGBA1C 6.2 (H) 06/25/2019 01:35 PM   . Pharmacist Clinical Goal(s): o Over the next 180 days, patient will work with PharmD and providers to maintain A1c goal <7% . Current regimen:  o Diet and exercise management   . Interventions: o Requested patient to reduce ice cream intake to 3 days a week versus every Faelynn Wynder o Requested patient to check blood sugar 3 times per week and record . Patient self care activities - Over the next 90 days, patient will: o Check blood sugar three times per week document, and provide at future appointments o Continue limiting ice cream to 3 times per week or less o Contact provider with any episodes of hypoglycemia  Chronic Nausea . Pharmacist Clinical Goal(s) o Over the next 180 days, patient will work with PharmD and providers to reduce nausea symptoms . Current regimen:  . Pantoprazole 40mg65mly noon . Famotidine 20mg 60my bedtime . Interventions: o Brief review of the results from her endoscopy, colonoscopy, and GI pathology. 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  o Recommended patient to follow up with GI. . PatiMarland Kitchennt self care activities - Over the next 180 days, patient will: o Follow up with GI   Pain . Pharmacist Clinical Goal(s) o Over the next 90 days, patient will work with PharmD and providers to reduce patient's symptoms of pain . Current regimen:  o Meloxicam 7.5mg da93m o Biofreeze as needed o Aspercreme as needed . Interventions: o Consider increasing meloxicam to 15mg da39mto help with pain . Patient self care activities  - Over the next 90 days, patient will: o Maintain pain medication regimen  Medication management . Pharmacist Clinical Goal(s): o Over the next 90 days, patient will work with PharmD and providers to maintain optimal medication adherence . [facility] o Comprehensive medication review performed. o Continue current medication management strategy . Patient self care activities - Over the next 90 days, patient will: o Focus on medication adherence by filling medications appropriately  o Take medications as prescribed o Report any questions or concerns to PharmD and/or provider(s)  Please see past updates related to this goal by clicking on the "Past Updates" button in the selected goal        Social Hx:  From [place] son age 38. 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  She is a caregiver for her mother  Update 01/20/20 Also seeing neurosurgery for back. States she received a cortisone shot in her back last year. RTC in 03/2020. Seeing PT (couldn't afford twice weekly sessions) Moved to monthly sessions with home exercises. Reports she has spinal stenosis. Feels she can't walk today. Trying to avoid surgery. Takes all medicines between 11am-1pm  Diabetes   A1c goal <7% Fasting 80-130 Post prandial <180  Recent Relevant Labs: Lab Results  Component Value Date/Time   HGBA1C 6.6 (H) 11/05/2019 01:55 PM   HGBA1C 6.2 (H) 06/25/2019 01:35 PM   MICROALBUR 0.7 09/16/2014 02:32 PM   MICROALBUR 0.8 11/24/2013 08:39 AM    Checking BG: Weekly    Patient has failed these meds in past: None noted  Patient is currently controlled on the following medications:   None  Last diabetic Eye exam:  Lab Results  Component Value Date/Time   HMDIABEYEEXA No Retinopathy 01/13/2020 12:00 AM    Last diabetic Foot exam: No results found for: HMDIABFOOTEX    Problem  Story Diet:  Chicken, fish, [place] Vegetables: Zucchini, squash, broccoli, potatoes Pinto beans Weakness is ice cream and cookies. Eats ice cream daily. Serving size cup to a bowl. May have klondike bar or ice cream sandwich.   We discussed: diet and exercise extensively   Update 07/23/19 Doesn't think she has had any ice cream in the last week. Has really stuck to limiting ice cream to 3 times per week  Recent FBG Readings: 134 133 109 126 Average 125.5  Congratulated patient on a1c decrease!   Update 01/20/20 A1c increased from previous. How much ice cream? 1-2 times per week   Plan -Continue to to limit ice cream intake to 3 days a week versus every Rowe Warman -Continue to check blood sugar 3 times per week and record -Continue control with diet and exercise   Hypertension   Blood pressure goal <140/90  CMP Latest Ref Rng & Units 11/05/2019 09/19/2019 06/25/2019  Glucose 65 - 99 mg/dL 171(H) 91 104(H)  BUN 7 - 25 mg/dL _0 Creatinine 0.50 - 0.99 mg/dL 0.89 0.91 0.69  Sodium 135 - 146 mmol/L 138 140 143  Potassium 3.5 - 5.3 mmol/L 3.7 4.1 3.8  Chloride 98 - 110 mmol/L 102 103 108  CO2 20 - 32 mmol/L _1 Calcium 8.6 - 10.4 mg/dL 9.0 9.7 9.3  Total Protein 6.1 - 8.1 g/dL 7.0 - -  Total Bilirubin 0.2 - 1.2 mg/dL 0.3 - -  Alkaline Phos 38 - 126 U/L - - -  AST 10 - 35 U/L 14 - -  ALT 6 - 29 U/L 18 - -   Office blood pressures are  BP Readings from Last 3 Encounters:  12/30/19 (!) 126/46  12/04/19 (!) 150/70  10/31/19 (!) 138/54   Patient has failed these meds in the past: spironolactone (itching), diltiazem (d/c when pt had AV heart block?) Patient is currently controlled on the following medications:   Amlodipine 23m daily  Lisinopril 437mdaily  Metoprolol succinate 256maily  HCTZ 35m48mily  Patient checks BP at home 1-2x per week  Patient home BP readings are ranging:  _2 Average 134.6 71.2  We discussed  Importance of med adherence. Proper blood pressure measurement technique   Update 01/20/20 137/72 I think Has had a headache, but feels this is due to stress Dizziness occasionally No chest pain Feels  she hasn't been above 140  Plan -Continue to check blood pressure 3 times per week and record -Continue current medications   Nausea/Belching/GI symptoms    Patient has failed these meds in past: None noted  Patient is currently uncontrolled on the following medications: . Pantoprazole 52m daily noon . Famotidine 246mdaily bedtime  Feels she doesn't burp as much with famotidine, but still burping Feels she still has nausea and irregular bowel movements Feels popsicles help more than ice cream. Certain smells nauseate her  Update 01/20/20 Brief review of the results from her endoscopy, colonoscopy, and GI pathology. 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  Recommended patient to follow up with GI.  Plan -Continue current medications  KaDe BlanchPharmD Clinical Pharmacist [phone]

## 2020-01-21 NOTE — Patient Instructions (Signed)
Visit Information  Goals Addressed            This Visit's Progress   . Chronic Care Management Pharmacy Care Plan       CARE PLAN ENTRY (see longitudinal plan of care for additional care plan information)  Current Barriers:  . Chronic Disease Management support, education, and care coordination needs related to Diabetes, Hypertension, Pain   Hypertension BP Readings from Last 3 Encounters:  12/30/19 (!) 126/46  12/04/19 (!) 150/70  10/31/19 (!) 138/54   . Pharmacist Clinical Goal(s): o Over the next 180 days, patient will work with PharmD and providers to maintain BP goal <140/90 . Current regimen:  o Amlodipine 5mg  daily o Lisinopril 40mg  daily o Metoprolol succinate 25mg  daily o HCTZ 25mg  daily . Interventions: o Requested patient to check blood pressure 3 times per week and record . Patient self care activities - Over the next 90 days, patient will: o Check BP three times weekly, document, and provide at future appointments o Ensure daily salt intake < 2300 mg/Jo-Ann Johanning  Diabetes Lab Results  Component Value Date/Time   HGBA1C 6.6 (H) 11/05/2019 01:55 PM   HGBA1C 6.2 (H) 06/25/2019 01:35 PM   . Pharmacist Clinical Goal(s): o Over the next 180 days, patient will work with PharmD and providers to maintain A1c goal <7% . Current regimen:  o Diet and exercise management   . Interventions: o Requested patient to reduce ice cream intake to 3 days a week versus every Izel Eisenhardt o Requested patient to check blood sugar 3 times per week and record . Patient self care activities - Over the next 90 days, patient will: o Check blood sugar three times per week document, and provide at future appointments o Continue limiting ice cream to 3 times per week or less o Contact provider with any episodes of hypoglycemia  Chronic Nausea . Pharmacist Clinical Goal(s) o Over the next 180 days, patient will work with PharmD and providers to reduce nausea symptoms . Current regimen:   . Pantoprazole 40mg  daily noon . Famotidine 20mg  daily bedtime . Interventions: o Brief review of the results from her endoscopy, colonoscopy, and GI pathology. 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  o Recommended patient to follow up with GI. Marland Kitchen Patient self care activities - Over the next 180 days, patient will: o Follow up with GI   Pain . Pharmacist Clinical Goal(s) o Over the next 90 days, patient will work with PharmD and providers to reduce patient's symptoms of pain . Current regimen:  o Meloxicam 7.5mg  daily o Biofreeze as needed o Aspercreme as needed . Interventions: o Consider increasing meloxicam to 15mg  daily to help with pain . Patient self care activities - Over the next 90 days, patient will: o Maintain pain medication regimen  Medication management . Pharmacist Clinical Goal(s): o Over the next 90 days, patient will work with PharmD and providers to maintain optimal medication adherence . Current pharmacy: [facility] . Interventions o Comprehensive medication review performed. o Continue current medication management strategy . Patient self care activities - Over the next 90 days, patient will: o Focus on medication adherence by filling medications appropriately  o Take medications as prescribed o Report any questions or concerns to PharmD and/or provider(s)  Please see past updates related to this goal by clicking on the "Past Updates" button in the selected goal         The patient verbalized understanding of instructions, educational materials, and care plan provided  today and declined offer to receive copy of patient instructions, educational materials, and care plan.   Telephone follow up appointment with pharmacy team member scheduled for: 07/20/2020  Melvenia Beam Merit Maybee, PharmD Clinical Pharmacist [facility] [phone]

## 2020-01-27 ENCOUNTER — Encounter: Payer: Self-pay | Admitting: Gastroenterology

## 2020-01-27 ENCOUNTER — Ambulatory Visit (INDEPENDENT_AMBULATORY_CARE_PROVIDER_SITE_OTHER): Payer: Medicare Other | Admitting: Gastroenterology

## 2020-01-27 VITALS — BP 130/60 | HR 75 | Ht 63.0 in | Wt 189.8 lb

## 2020-01-27 DIAGNOSIS — R1013 Epigastric pain: Secondary | ICD-10-CM | POA: Diagnosis not present

## 2020-01-27 DIAGNOSIS — K5900 Constipation, unspecified: Secondary | ICD-10-CM

## 2020-01-27 NOTE — Progress Notes (Signed)
Review of pertinent gastrointestinal problems: 1.  Increased risk for colon cancer.  Her brother had colon cancer.  Colonoscopy October 2013 showed a single small polyp which was not neoplastic.  Colonoscopy 12/2019 five subcentimeter adenomas were removed.  She was recommended to have repeat colonoscopy at 3-year interval 2.  Perforated appendicitis, abscess Feb2021: Treated with drain placement and then eventual surgery 06/2019 3.  Dyspepsia, belching, dysphagia led to EGD 12/2019; mild nonspecific gastritis and some linear erythema were biopsied.  These showed no evidence of gave, no evidence of H. Pylori.   HPI: This is a very pleasant 69 year old woman whom I last saw the time of upper endoscopy and colonoscopy earlier this month.  See those results summarized above.  She is overall better but she is still bothered by belching, some mild early satiety and anorexia.  She has not lost any weight.  She cannot point to any particular foods that cause the symptoms.  She is also a bit concerned that she needs to take MiraLAX every other day in order to have bowel movements.  No fevers or chills, no dysphagia  She takes pantoprazole after her lunch meal, she rarely takes Pepcid at bedtime   ROS: complete GI ROS as described in HPI, all other review negative.  Constitutional:  No unintentional weight loss   Past Medical History:  Diagnosis Date  . Anemia   . Arthritis   . Depression   . Diabetes mellitus without complication ([place])   . Dysrhythmia    Over 10 years ago  . Gum disease   . Heart murmur   . History of syphilis   . Hypertension   . Irregular heart beat   . Personal history of colonic polyps   . Phlebitis   . Pre-diabetes     Past Surgical History:  Procedure Laterality Date  . CYSTECTOMY     between breasts  . IR RADIOLOGIST EVAL & MGMT  04/15/2019  . IR RADIOLOGIST EVAL & MGMT  04/29/2019  . IR RADIOLOGIST EVAL & MGMT  05/08/2019  . LAPAROSCOPIC APPENDECTOMY N/A  06/30/2019   Procedure: APPENDECTOMY LAPAROSCOPIC;  Surgeon: Ralene Ok, MD;  Location: [facility];  Service: General;  Laterality: N/A;  . SHOULDER ARTHROSCOPY Right 12/2015  . SPINE SURGERY  08/20/2015   bulging / herniated disc in C-spine  . THERAPEUTIC ABORTION  1970. 1974, 1978    Current Outpatient Medications  Medication Sig Dispense Refill  . acetaminophen (TYLENOL) 650 MG CR tablet Take 650 mg by mouth every 8 (eight) hours as needed for pain.    Marland Kitchen aspirin EC 81 MG tablet Take 81 mg by mouth daily with lunch.    . blood glucose meter kit and supplies KIT Dispense based on patient and insurance preference. Use up to four times daily as directed. (FOR ICD-9 250.00, 250.01). 1 each 0  . calcium carbonate (OSCAL) 1500 (600 Ca) MG TABS tablet Take 1,200 mg by mouth daily with lunch. 800 units vitamin D    . famotidine (PEPCID) 20 MG tablet TAKE 1 TABLET(20 MG TOTAL) BY MOUTH AT BEDTIME. FOR BURPING. 90 tablet 0  . ferrous sulfate 325 (65 FE) MG EC tablet Take 325 mg by mouth daily with lunch.    . hydrochlorothiazide (HYDRODIURIL) 25 MG tablet Take 1 tablet (25 mg total) by mouth daily. 90 tablet 1  . lisinopril (ZESTRIL) 40 MG tablet TAKE 1 TABLET BY MOUTH  DAILY (Patient taking differently: Take 40 mg by mouth daily with lunch. )  90 tablet 3  . meloxicam (MOBIC) 7.5 MG tablet TAKE 1 TABLET BY MOUTH  DAILY 90 tablet 1  . metoprolol succinate (TOPROL-XL) 50 MG 24 hr tablet Take 1.5 tablets (75 mg total) by mouth daily. Take with or immediately following a meal. 135 tablet 1  . pantoprazole (PROTONIX) 40 MG tablet Take 1 tablet (40 mg total) by mouth daily. 30 tablet 3   No current facility-administered medications for this visit.    Allergies as of 01/27/2020 - Review Complete 01/27/2020  Allergen Reaction Noted  . Aldactone [spironolactone] Other (See Comments) 07/05/2015    Family History  Problem Relation Age of Onset  . Diabetes Mother   . Cancer Father        lung cancer  .  Kidney disease Father   . Seizures Father   . Colon cancer Brother   . Heart attack Brother   . Stroke Brother   . Stomach cancer Neg Hx   . Esophageal cancer Neg Hx   . Rectal cancer Neg Hx     Social History   Socioeconomic History  . Marital status: Divorced    Spouse name: Not on file  . Number of children: 1  . Years of education: Not on file  . Highest education level: Not on file  Occupational History  . Not on file  Tobacco Use  . Smoking status: Former Smoker    Types: Cigarettes    Quit date: 2011    Years since quitting: 10.9  . Smokeless tobacco: Never Used  Vaping Use  . Vaping Use: Never used  Substance and Sexual Activity  . Alcohol use: No  . Drug use: No  . Sexual activity: Not on file  Other Topics Concern  . 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 Difficulty of Paying Living Expenses: Not very hard  Food Insecurity: No Food Insecurity  . Worried About Charity fundraiser in the Last Year: Never true  . Ran Out of Food in the Last Year: Never true  Transportation Needs: No Transportation Needs  . Lack of Transportation (Medical): No  . Lack of Transportation (Non-Medical): No  Physical Activity:   . Days of Exercise per Week: Not on file  . Minutes of Exercise per Session: Not on file  Stress:   . Feeling of Stress : Not on file  Social Connections:   . Frequency of Communication with Friends and Family: Not on file  . Frequency of Social Gatherings with Friends and Family: Not on file  . Attends Religious  Services: Not on file  . Active Member of Clubs or Organizations: Not on file  . Attends Archivist Meetings: Not on file  . Marital Status: Not on file  Intimate Partner Violence:   . Fear of Current or Ex-Partner: Not on file  . Emotionally Abused: Not on file  . Physically Abused: Not on file  . Sexually Abused: Not on file  Physical Exam: Ht _0  (1.6 m)   Wt 189 lb 12.8 oz (86.1 kg)   BMI 33.62 kg/m  Constitutional: generally well-appearing Psychiatric: alert and oriented x3 Abdomen: soft, nontender, nondistended, no obvious ascites, no peritoneal signs, normal bowel sounds No peripheral edema noted in lower extremities  Assessment and plan: 69 y.o. female with GERD, dyspepsia, constipation  She is not losing weight, has no alarm symptoms.  EGD and colonoscopy earlier this month were reassuring.  I recommended she change the way she is taking her antiacid medicines so that she is taking her pantoprazole shortly before her first meal of the day which is generally around noon.  I also recommended she start taking her Pepcid at bedtime every single night instead of just once in a while.  She is going to add a single Gas-X pill with every meal for her bloating.  I reassured her that it is certainly safe that she take MiraLAX every other day or even more frequently so that she has bowel movements every other day or every day basis.  No testing is necessary for now.  She will return to see me in 6 or 8 weeks and sooner if any problems.  Please see the "Patient Instructions" section for addition details about the plan.  Owens Loffler, MD [facility] 01/27/2020, 9:52 AM   Total time on date of encounter was 67mnutes (this included time spent preparing to see the patient reviewing records; obtaining and/or reviewing separately obtained history; performing a medically appropriate exam and/or evaluation; counseling and educating the patient and family if  present; ordering medications, tests or procedures if applicable; and documenting clinical information in the health record).

## 2020-01-27 NOTE — Patient Instructions (Addendum)
If you are age 69 or older, your body mass index should be between 23-30. Your Body mass index is 33.62 kg/m. If this is out of the aforementioned range listed, please consider follow up with your Primary Care Provider.  If you are age 66 or younger, your body mass index should be between 19-25. Your Body mass index is 33.62 kg/m. If this is out of the aformentioned range listed, please consider follow up with your Primary Care Provider.   Please take pantoprazole 20 to 30 minutes before your first meal of the day.   Please take pepcid at bedtime.  Please purchase the following medications over the counter and take as directed:  START: GAS-X take one with every meal.  USE: Miralax every other day to once daily for issues with bowels.  You are scheduled to follow up with Dr Ardis Hughs on 03-24-2020 at 10:50am.  Thank you for entrusting me with your care and choosing [facility].  Dr Ardis Hughs

## 2020-02-03 DIAGNOSIS — M545 Low back pain, unspecified: Secondary | ICD-10-CM | POA: Diagnosis not present

## 2020-02-03 DIAGNOSIS — M48062 Spinal stenosis, lumbar region with neurogenic claudication: Secondary | ICD-10-CM | POA: Diagnosis not present

## 2020-03-10 ENCOUNTER — Other Ambulatory Visit: Payer: Self-pay | Admitting: Family

## 2020-03-11 ENCOUNTER — Other Ambulatory Visit: Payer: Self-pay | Admitting: Family

## 2020-03-14 ENCOUNTER — Other Ambulatory Visit: Payer: Self-pay | Admitting: Family

## 2020-03-24 ENCOUNTER — Ambulatory Visit: Payer: Medicare Other | Admitting: Gastroenterology

## 2020-03-26 ENCOUNTER — Encounter: Payer: Self-pay | Admitting: Family

## 2020-03-27 ENCOUNTER — Encounter: Payer: Self-pay | Admitting: Family

## 2020-03-29 ENCOUNTER — Other Ambulatory Visit: Payer: Self-pay

## 2020-03-29 MED ORDER — PANTOPRAZOLE SODIUM 40 MG PO TBEC: DELAYED_RELEASE_TABLET | ORAL | 0 refills | Status: DC

## 2020-04-02 ENCOUNTER — Telehealth: Payer: Self-pay | Admitting: Pharmacist

## 2020-04-02 NOTE — Progress Notes (Addendum)
Chronic Care Management Pharmacy Assistant   Name: SHAVONDA WIEDMAN  MRN: [id number] DOB: 1950-03-06  Reason for Encounter: Disease State For DM  Patient Questions:  1.  Have you seen any other providers since your last visit? Yes.   2.  Any changes in your medicines or health? Yes.  PCP : [name], NP   Their chronic conditions include: Diabetes, Hypertension, Pain.  Office Visits: None since 01/20/20  Consults: 01/27/20 Mickie Kay, MD. Per note the doctor recommended she start taking her Pepcid at bedtime every single night instead of just once in a while. STARTED taking Gas-X pill every meal for bloating.  Allergies:   Allergies  Allergen Reactions   Aldactone [Spironolactone] Other (See Comments)    itching    Medications: Outpatient Encounter Medications as of 04/02/2020  Medication Sig   acetaminophen (TYLENOL) 650 MG CR tablet Take 650 mg by mouth every 8 (eight) hours as needed for pain.   aspirin EC 81 MG tablet Take 81 mg by mouth daily with lunch.   blood glucose meter kit and supplies KIT Dispense based on patient and insurance preference. Use up to four times daily as directed. (FOR ICD-9 250.00, 250.01).   calcium carbonate (OSCAL) 1500 (600 Ca) MG TABS tablet Take 1,200 mg by mouth daily with lunch. 800 units vitamin D   famotidine (PEPCID) 20 MG tablet TAKE 1 TABLET(20 MG TOTAL) BY MOUTH AT BEDTIME. FOR BURPING.   ferrous sulfate 325 (65 FE) MG EC tablet Take 325 mg by mouth daily with lunch.   hydrochlorothiazide (HYDRODIURIL) 25 MG tablet Take 1 tablet (25 mg total) by mouth daily.   lisinopril (ZESTRIL) 40 MG tablet TAKE 1 TABLET BY MOUTH  DAILY   meloxicam (MOBIC) 7.5 MG tablet TAKE 1 TABLET BY MOUTH  DAILY   metoprolol succinate (TOPROL-XL) 50 MG 24 hr tablet Take 1.5 tablets (75 mg total) by mouth daily. Take with or immediately following a meal.   pantoprazole (PROTONIX) 40 MG tablet TAKE 1 TABLET(40 MG) BY MOUTH DAILY   No  facility-administered encounter medications on file as of 04/02/2020.    Current Diagnosis: Patient Active Problem List   Diagnosis Date Noted   Acute perforated appendicitis 04/02/2019   Chest pain 06/30/2015   Heart palpitations 06/30/2015   Hypokalemia 06/07/2015   Cellulitis and abscess of trunk 11/05/2014   Myalgia and myositis 05/28/2014   Allergic rhinitis 05/28/2014   Thyromegaly 11/24/2013   Foot pain 11/24/2013   Anemia, iron deficiency 08/25/2013   Diabetes type 2, controlled ([place]) 08/25/2013   Other malaise and fatigue 10/14/2012   Cervical disc disease 06/06/2012   Routine general medical examination at a health care facility 11/13/2011   Hypothyroid 10/24/2011   Arthritis 09/25/2011   Depression 09/25/2011   History of syphilis 09/25/2011   HTN (hypertension) 09/25/2011    Goals Addressed   None    Recent Relevant Labs: Lab Results  Component Value Date/Time   HGBA1C 6.6 (H) 11/05/2019 01:55 PM   HGBA1C 6.2 (H) 06/25/2019 01:35 PM   MICROALBUR 0.7 09/16/2014 02:32 PM   MICROALBUR 0.8 11/24/2013 08:39 AM    Kidney Function Lab Results  Component Value Date/Time   CREATININE 0.89 11/05/2019 01:55 PM   CREATININE 0.91 09/19/2019 02:16 PM   GFR 93.07 11/05/2018 02:36 PM   GFRNONAA >60 06/25/2019 01:36 PM   GFRNONAA 78 11/13/2011 08:27 AM   GFRAA >60 06/25/2019 01:36 PM   GFRAA >89 11/13/2011 08:27 AM  Current antihyperglycemic regimen:  Diet and exercise management    What recent interventions/DTPs have been made to improve glycemic control:  None  Have there been any recent hospitalizations or ED visits since last visit with CPP? Patient stated no.   Patient reports hypoglycemic symptoms, including None   Patient reports hyperglycemic symptoms, including none   How often are you checking your blood sugar? Patient stated she doesn't check her sugar simply because she does not see the need to do so.  What are your blood sugars ranging?   Fasting: N/A Before meals: N/A After meals: N/A Bedtime: N/A  During the week, how often does your blood glucose drop below 70? Patient stated she doesn't check her sugar simply because she does not see the need to do so.  Are you checking your feet daily/regularly?   Patient stated she checks her feet daily.   Adherence Review: Is the patient currently on a STATIN medication? No.   Is the patient currently on ACE/ARB medication? Yes, Lisinopril 40 mg.   Does the patient have >5 day gap between last estimated fill dates? No  Patient stated she doesn't have any concerns or questions about her medications at this time.    Follow-Up:  Pharmacist Review   Charlann Lange, RMA Clinical Pharmacist Assistant [phone]  5 minutes spent in review, coordination, and documentation.  Reviewed by: Beverly Milch, PharmD Clinical Pharmacist [facility] [phone]

## 2020-04-19 ENCOUNTER — Encounter: Payer: Self-pay | Admitting: Gastroenterology

## 2020-04-19 ENCOUNTER — Ambulatory Visit (INDEPENDENT_AMBULATORY_CARE_PROVIDER_SITE_OTHER): Payer: Medicare Other | Admitting: Gastroenterology

## 2020-04-19 VITALS — BP 130/60 | HR 76 | Ht 63.0 in | Wt 186.4 lb

## 2020-04-19 DIAGNOSIS — R140 Abdominal distension (gaseous): Secondary | ICD-10-CM

## 2020-04-19 DIAGNOSIS — R110 Nausea: Secondary | ICD-10-CM

## 2020-04-19 DIAGNOSIS — K219 Gastro-esophageal reflux disease without esophagitis: Secondary | ICD-10-CM

## 2020-04-19 MED ORDER — ONDANSETRON HCL 4 MG PO TABS: 4.0000 mg | ORAL_TABLET | Freq: Two times a day (BID) | ORAL | 6 refills | Status: DC

## 2020-04-19 MED ORDER — PANTOPRAZOLE SODIUM 40 MG PO TBEC: DELAYED_RELEASE_TABLET | ORAL | 3 refills | Status: DC

## 2020-04-19 NOTE — Patient Instructions (Addendum)
Take 1 Gas-X with every meal every day  We will send Zofran 4 mg twice daily as needed  We will also refill pantoprazole for you, Take 40 mg at least 30 minutes before breakfast  Follow up in 3 months  Due to recent changes in healthcare laws, you may see the results of your imaging and laboratory studies on MyChart before your provider has had a chance to review them.  We understand that in some cases there may be results that are confusing or concerning to you. Not all laboratory results come back in the same time frame and the provider may be waiting for multiple results in order to interpret others.  Please give [place] 48 hours in order for your provider to thoroughly review all the results before contacting the office for clarification of your results.   If you are age 31 or older, your body mass index should be between 23-30. Your Body mass index is 33.01 kg/m. If this is out of the aforementioned range listed, please consider follow up with your Primary Care Provider.  If you are age 85 or younger, your body mass index should be between 19-25. Your Body mass index is 33.01 kg/m. If this is out of the aformentioned range listed, please consider follow up with your Primary Care Provider.    I appreciate the  opportunity to care for you  Thank You   Shela Commons

## 2020-04-19 NOTE — Progress Notes (Signed)
Review of pertinent gastrointestinal problems: 1.  Increased risk for colon cancer.  Her brother had colon cancer.  Colonoscopy October 2013 showed a single small polyp which was not neoplastic.  Colonoscopy 12/2019 five subcentimeter adenomas were removed.  She was recommended to have repeat colonoscopy at 3-year interval 2.  Perforated appendicitis, abscess Feb2021: Treated with drain placement and then eventual surgery 06/2019 3.  Dyspepsia, belching, dysphagia led to EGD 12/2019; mild nonspecific gastritis and some linear erythema were biopsied.  These showed no evidence of gave, no evidence of H. Pylori.   HPI: This is a pleasant 70 year old woman whom I last saw 3 months ago   I last saw her here in our office about 3 months ago.  At that time she was bothered by belching, early satiety, anorexia.  She was not losing weight and had no alarm symptoms.  I recommended she alter the way she is taking her proton pump inhibitor so that is shortly before her breakfast meal the day.  I also recommend she start taking a Pepcid at bedtime and also Gas-X pill before every meal for her bloating.  I reassured her that it would be safe to take MiraLAX on an every day basis or even more frequently so that she would have a bowel movement every day.  Her weight is down 3 pounds since she was here in our office 3 months ago.  She does not think she is taking her pantoprazole at all.  She is not taking Gas-X pills with every meal.  She is however taking Pepcid at bedtime every night.  She has clearly noticed less belching but it is not gone.  She has chronic nausea.  She has rather early satiety.  She is very fatigued.  She stopped drinking lattes and cappuccinos from [place] several months ago and noticed that that helped overall as well.   ROS: complete GI ROS as described in HPI, all other review negative.  Constitutional:  No unintentional weight loss   Past Medical History:  Diagnosis Date  .  Anemia   . Arthritis   . Depression   . Diabetes mellitus without complication ([place])   . Dysrhythmia    Over 10 years ago  . Gum disease   . Heart murmur   . History of syphilis   . Hypertension   . Irregular heart beat   . Personal history of colonic polyps   . Phlebitis   . Pre-diabetes     Past Surgical History:  Procedure Laterality Date  . CYSTECTOMY     between breasts  . IR RADIOLOGIST EVAL & MGMT  04/15/2019  . IR RADIOLOGIST EVAL & MGMT  04/29/2019  . IR RADIOLOGIST EVAL & MGMT  05/08/2019  . LAPAROSCOPIC APPENDECTOMY N/A 06/30/2019   Procedure: APPENDECTOMY LAPAROSCOPIC;  Surgeon: Ralene Ok, MD;  Location: [facility];  Service: General;  Laterality: N/A;  . SHOULDER ARTHROSCOPY Right 12/2015  . SPINE SURGERY  08/20/2015   bulging / herniated disc in C-spine  . THERAPEUTIC ABORTION  1970. 1974, 1978    Current Outpatient Medications  Medication Sig Dispense Refill  . acetaminophen (TYLENOL) 650 MG CR tablet Take 650 mg by mouth every 8 (eight) hours as needed for pain.    Marland Kitchen aspirin EC 81 MG tablet Take 81 mg by mouth daily with lunch.    . blood glucose meter kit and supplies KIT Dispense based on patient and insurance preference. Use up to four times daily as directed. (FOR ICD-9  250.00, 250.01). 1 each 0  . calcium carbonate (OSCAL) 1500 (600 Ca) MG TABS tablet Take 1,200 mg by mouth daily with lunch. 800 units vitamin D    . famotidine (PEPCID) 20 MG tablet TAKE 1 TABLET(20 MG TOTAL) BY MOUTH AT BEDTIME. FOR BURPING. 90 tablet 0  . ferrous sulfate 325 (65 FE) MG EC tablet Take 325 mg by mouth daily with lunch.    . hydrochlorothiazide (HYDRODIURIL) 25 MG tablet Take 1 tablet (25 mg total) by mouth daily. 90 tablet 1  . lisinopril (ZESTRIL) 40 MG tablet TAKE 1 TABLET BY MOUTH  DAILY 90 tablet 1  . meloxicam (MOBIC) 7.5 MG tablet TAKE 1 TABLET BY MOUTH  DAILY 90 tablet 3  . metoprolol succinate (TOPROL-XL) 50 MG 24 hr tablet Take 1.5 tablets (75 mg total) by mouth  daily. Take with or immediately following a meal. 135 tablet 1  . pantoprazole (PROTONIX) 40 MG tablet TAKE 1 TABLET(40 MG) BY MOUTH DAILY 90 tablet 0   No current facility-administered medications for this visit.    Allergies as of 04/19/2020 - Review Complete 04/19/2020  Allergen Reaction Noted  . Aldactone [spironolactone] Other (See Comments) 07/05/2015    Family History  Problem Relation Age of Onset  . Diabetes Mother   . Cancer Father        lung cancer  . Kidney disease Father   . Seizures Father   . Colon cancer Brother   . Heart attack Brother   . Stroke Brother   . Stomach cancer Neg Hx   . Esophageal cancer Neg Hx   . Rectal cancer Neg Hx     Social History   Socioeconomic History  . Marital status: Divorced    Spouse name: Not on file  . Number of children: 1  . Years of education: Not on file  . Highest education level: Not on file  Occupational History  . Not on file  Tobacco Use  . Smoking status: Former Smoker    Types: Cigarettes    Quit date: 2011    Years since quitting: 11.1  . Smokeless tobacco: Never Used  Vaping Use  . Vaping Use: Never used  Substance and Sexual Activity  . Alcohol use: No  . Drug use: No  . Sexual activity: Not on file  Other Topics Concern  . 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 Difficulty of Paying Living Expenses: Not very hard  Food Insecurity: No Food Insecurity  . Worried About Charity fundraiser in the Last Year: Never true  . Ran Out of Food in the Last Year: Never true   Transportation Needs: No Transportation Needs  . Lack of Transportation (Medical): No  . Lack of Transportation (Non-Medical): No  Physical Activity: Not on file  Stress: Not on file  Social Connections: Not on file  Intimate Partner Violence: Not on file     Physical Exam: BP 130/60   Pulse 76  Ht _0  (1.6 m)   Wt 186 lb 6 oz (84.5 kg)   BMI 33.01 kg/m  Constitutional: generally well-appearing Psychiatric: alert and oriented x3 Abdomen: soft, nontender, nondistended, no obvious ascites, no peritoneal signs, normal bowel sounds No peripheral edema noted in lower extremities  Assessment and plan: 70 y.o. female with dyspepsia, belching, early satiety  We discussed her recent colonoscopy and upper endoscopy results again.  Nothing in these explains her upper and lower GI symptoms.  She is not taking some of the medicines which I recommended previously at all.  We went over this again.  She is going to start taking Gas-X 1 pill shortly before every meal.  She is going to start taking pantoprazole 40 mg pills shortly before breakfast every morning.  I gave her a new prescription for that.  She is going to continue taking her famotidine 20 mg 1 pill at bedtime every night.  I refilled her Zofran prescription 4 mg pills 1 pill twice daily as needed for nausea.  60 pills, 6 refills.  She will return to see me in 3 months.  Please see the "Patient Instructions" section for addition details about the plan.  Owens Loffler, MD [facility] 04/19/2020, 9:38 AM   Total time on date of encounter was 35 minutes (this included time spent preparing to see the patient reviewing records; obtaining and/or reviewing separately obtained history; performing a medically appropriate exam and/or evaluation; counseling and educating the patient and family if present; ordering medications, tests or procedures if applicable; and documenting clinical information in the health record).

## 2020-04-21 ENCOUNTER — Other Ambulatory Visit (HOSPITAL_BASED_OUTPATIENT_CLINIC_OR_DEPARTMENT_OTHER): Payer: Self-pay | Admitting: Family

## 2020-04-21 DIAGNOSIS — Z1231 Encounter for screening mammogram for malignant neoplasm of breast: Secondary | ICD-10-CM

## 2020-04-23 ENCOUNTER — Telehealth: Payer: Self-pay | Admitting: Pharmacist

## 2020-04-23 NOTE — Progress Notes (Signed)
    Chronic Care Management Pharmacy Assistant   Name: Cassidy Harrell  MRN: [id number] DOB: [birth year]  Reason for Encounter: Adherence Review  Verified Adherence Gap Information. Per insurance data, the patient is 100% compliant with HTN medication. Per insurance data patient has met their wellness bundle and annual wellness visit screening.Their most recent A1C was 6.6 on 11/05/19 and their most recent blood pressure was 150/70 on 12/04/19. The patients total gap-all measures is equal to 1.  PCP : [name], NP  Follow-Up:  Pharmacist Review   Charlann Lange, [place] Clinical Pharmacist Assistant [phone]'

## 2020-05-04 ENCOUNTER — Ambulatory Visit (HOSPITAL_BASED_OUTPATIENT_CLINIC_OR_DEPARTMENT_OTHER): Payer: Medicare Other

## 2020-05-04 ENCOUNTER — Ambulatory Visit (INDEPENDENT_AMBULATORY_CARE_PROVIDER_SITE_OTHER): Payer: Medicare Other | Admitting: Family

## 2020-05-04 ENCOUNTER — Encounter: Payer: Self-pay | Admitting: Family

## 2020-05-04 ENCOUNTER — Other Ambulatory Visit: Payer: Self-pay

## 2020-05-04 ENCOUNTER — Ambulatory Visit: Payer: Medicare Other | Admitting: Family

## 2020-05-04 VITALS — BP 130/65 | HR 77 | Temp 98.3°F | Resp 16 | Ht 63.0 in | Wt 186.0 lb

## 2020-05-04 DIAGNOSIS — D509 Iron deficiency anemia, unspecified: Secondary | ICD-10-CM | POA: Diagnosis not present

## 2020-05-04 DIAGNOSIS — I10 Essential (primary) hypertension: Secondary | ICD-10-CM

## 2020-05-04 DIAGNOSIS — R110 Nausea: Secondary | ICD-10-CM | POA: Diagnosis not present

## 2020-05-04 DIAGNOSIS — E119 Type 2 diabetes mellitus without complications: Secondary | ICD-10-CM | POA: Diagnosis not present

## 2020-05-04 LAB — CBC WITH DIFFERENTIAL/PLATELET
Basophils Absolute: 0 10*3/uL (ref 0.0–0.1)
Basophils Relative: 0.4 % (ref 0.0–3.0)
Eosinophils Absolute: 0.3 10*3/uL (ref 0.0–0.7)
Eosinophils Relative: 5.2 % — ABNORMAL HIGH (ref 0.0–5.0)
HCT: 36.8 % (ref 36.0–46.0)
Hemoglobin: 12.2 g/dL (ref 12.0–15.0)
Lymphocytes Relative: 38.8 % (ref 12.0–46.0)
Lymphs Abs: 2 10*3/uL (ref 0.7–4.0)
MCHC: 33.1 g/dL (ref 30.0–36.0)
MCV: 86.3 fl (ref 78.0–100.0)
Monocytes Absolute: 0.5 10*3/uL (ref 0.1–1.0)
Monocytes Relative: 10.3 % (ref 3.0–12.0)
Neutro Abs: 2.3 10*3/uL (ref 1.4–7.7)
Neutrophils Relative %: 45.3 % (ref 43.0–77.0)
Platelets: 189 10*3/uL (ref 150.0–400.0)
RBC: 4.27 Mil/uL (ref 3.87–5.11)
RDW: 14.1 % (ref 11.5–15.5)
WBC: 5 10*3/uL (ref 4.0–10.5)

## 2020-05-04 LAB — COMPREHENSIVE METABOLIC PANEL
ALT: 20 U/L (ref 0–35)
AST: 19 U/L (ref 0–37)
Albumin: 4.2 g/dL (ref 3.5–5.2)
Alkaline Phosphatase: 59 U/L (ref 39–117)
BUN: 12 mg/dL (ref 6–23)
CO2: 30 mEq/L (ref 19–32)
Calcium: 9.5 mg/dL (ref 8.4–10.5)
Chloride: 102 mEq/L (ref 96–112)
Creatinine, Ser: 0.96 mg/dL (ref 0.40–1.20)
GFR: 60.5 mL/min (ref >60.00)
Glucose, Bld: 116 mg/dL — ABNORMAL HIGH (ref 70–99)
Potassium: 4.3 mEq/L (ref 3.5–5.1)
Sodium: 141 mEq/L (ref 135–145)
Total Bilirubin: 0.3 mg/dL (ref 0.2–1.2)
Total Protein: 7.1 g/dL (ref 6.0–8.3)

## 2020-05-04 LAB — HEMOGLOBIN A1C: Hgb A1c MFr Bld: 6.9 % — ABNORMAL HIGH (ref 4.6–6.5)

## 2020-05-04 LAB — LIPASE: Lipase: 42 U/L (ref 11.0–59.0)

## 2020-05-04 LAB — IRON: Iron: 71 ug/dL (ref 42–145)

## 2020-05-04 LAB — FERRITIN: Ferritin: 197.3 ng/mL (ref 10.0–291.0)

## 2020-05-04 MED ORDER — SHINGRIX 50 MCG/0.5ML IM SUSR: INTRAMUSCULAR | 1 refills | Status: DC

## 2020-05-04 MED ORDER — MULTI-VITAMIN/MINERALS PO TABS: 1.0000 | ORAL_TABLET | Freq: Every day | ORAL | Status: AC

## 2020-05-04 NOTE — Progress Notes (Signed)
Subjective:    Patient ID: Cassidy Harrell, female    DOB: 06/29/1950, 70 y.o.   MRN: [id number]  HPI  Patient is a 70 yr old female who presents today for follow up.  HTN- maintained on toprol xl 58m, hctz 234m lisinopril 4038m BP Readings from Last 3 Encounters:  04/19/20 130/60  01/27/20 130/60  12/30/19 (!) 126/46   DM2- diet controlled. Reports that sugar yesterday was 115 fasting.  Lab Results  Component Value Date   HGBA1C 6.6 (H) 11/05/2019   HGBA1C 6.2 (H) 06/25/2019   HGBA1C 7.1 (H) 11/05/2018   Lab Results  Component Value Date   MICROALBUR 0.7 09/16/2014   LDLCALC 13 10/03/2016   CREATININE 0.89 11/05/2019   Lab Results  Component Value Date   WBC 5.9 11/05/2019   HGB 12.2 11/05/2019   HCT 36.8 11/05/2019   MCV 84.2 11/05/2019   PLT 204 11/05/2019      Review of Systems    see HPI  Past Medical History:  Diagnosis Date  . Anemia   . Arthritis   . Depression   . Diabetes mellitus without complication ([place] . Dysrhythmia    Over 10 years ago  . Gum disease   . Heart murmur   . History of syphilis   . Hypertension   . Irregular heart beat   . Personal history of colonic polyps   . Phlebitis   . Pre-diabetes      Social History   Socioeconomic History  . Marital status: Divorced    Spouse name: Not on file  . Number of children: 1  . Years of education: Not on file  . Highest education level: Not on file  Occupational History  . Not on file  Tobacco Use  . Smoking status: Former Smoker    Types: Cigarettes    Quit date: 2011    Years since quitting: 11.1  . Smokeless tobacco: Never Used  Vaping Use  . Vaping Use: Never used  Substance and Sexual Activity  . Alcohol use: No  . Drug use: No  . Sexual activity: Not on file  Other Topics Concern  . 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 Difficulty of Paying Living Expenses: Not very hard  Food Insecurity: No Food Insecurity  . Worried About RunCharity fundraiser the Last Year: Never true  . Ran Out of Food in the Last Year: Never true  Transportation Needs: No Transportation Needs  . Lack of Transportation (Medical): No  . Lack of Transportation (Non-Medical): No  Physical Activity: Not on file  Stress: Not on file  Social Connections: Not on file  Intimate Partner Violence: Not on file    Past Surgical History:  Procedure Laterality Date  . CYSTECTOMY     between breasts  . IR RADIOLOGIST EVAL & MGMT  04/15/2019  . IR RADIOLOGIST EVAL & MGMT  04/29/2019  . IR RADIOLOGIST  EVAL & MGMT  05/08/2019  . LAPAROSCOPIC APPENDECTOMY N/A 06/30/2019   Procedure: APPENDECTOMY LAPAROSCOPIC;  Surgeon: Ralene Ok, MD;  Location: [facility];  Service: General;  Laterality: N/A;  . SHOULDER ARTHROSCOPY Right 12/2015  . SPINE SURGERY  08/20/2015   bulging / herniated disc in C-spine  . THERAPEUTIC ABORTION  1970. 1974, 1978    Family History  Problem Relation Age of Onset  . Diabetes Mother   . Cancer Father        lung cancer  . Kidney disease Father   . Seizures Father   . Colon cancer Brother   . Heart attack Brother   . Stroke Brother   . Stomach cancer Neg Hx   . Esophageal cancer Neg Hx   . Rectal cancer Neg Hx     Allergies  Allergen Reactions  . Aldactone [Spironolactone] Other (See Comments)    itching    Current Outpatient Medications on File Prior to Visit  Medication Sig Dispense Refill  . acetaminophen (TYLENOL) 650 MG CR tablet Take 650 mg by mouth every 8 (eight) hours as needed for pain.    Marland Kitchen  aspirin EC 81 MG tablet Take 81 mg by mouth daily with lunch.    . blood glucose meter kit and supplies KIT Dispense based on patient and insurance preference. Use up to four times daily as directed. (FOR ICD-9 250.00, 250.01). 1 each 0  . calcium carbonate (OSCAL) 1500 (600 Ca) MG TABS tablet Take 1,200 mg by mouth daily with lunch. 800 units vitamin D    . famotidine (PEPCID) 20 MG tablet TAKE 1 TABLET(20 MG TOTAL) BY MOUTH AT BEDTIME. FOR BURPING. 90 tablet 0  . hydrochlorothiazide (HYDRODIURIL) 25 MG tablet Take 1 tablet (25 mg total) by mouth daily. 90 tablet 1  . lisinopril (ZESTRIL) 40 MG tablet TAKE 1 TABLET BY MOUTH  DAILY 90 tablet 1  . meloxicam (MOBIC) 7.5 MG tablet TAKE 1 TABLET BY MOUTH  DAILY 90 tablet 3  . metoprolol succinate (TOPROL-XL) 50 MG 24 hr tablet Take 1.5 tablets (75 mg total) by mouth daily. Take with or immediately following a meal. 135 tablet 1  . ondansetron (ZOFRAN) 4 MG tablet Take 1 tablet (4 mg total) by mouth 2 (two) times daily. As needed 60 tablet 6  . pantoprazole (PROTONIX) 40 MG tablet TAKE 1 TABLET(40 MG) BY MOUTH DAILY 90 tablet 3   No current facility-administered medications on file prior to visit.    BP 130/65 (BP Location: Right Arm, Patient Position: Sitting, Cuff Size: Large)   Pulse 77   Temp 98.3 F (36.8 C) (Oral)   Resp 16   Ht '5\' 3"'  (1.6 m)   Wt 186 lb (84.4 kg)   SpO2 100%   BMI 32.95 kg/m    Objective:   Physical Exam Constitutional:      Appearance: She is well-developed and well-nourished.  Neck:     Thyroid: No thyromegaly.  Cardiovascular:     Rate and Rhythm: Normal rate and regular rhythm.     Heart sounds: Normal heart sounds. No murmur heard.   Pulmonary:     Effort: Pulmonary effort is normal. No respiratory distress.     Breath sounds: Normal breath sounds. No wheezing.  Musculoskeletal:     Cervical back: Neck supple.  Skin:    General: Skin is warm and dry.  Neurological:     Mental Status: She is alert  and oriented to person, place, and time.  Psychiatric:  Mood and Affect: Mood and affect normal.        Behavior: Behavior normal.        Thought Content: Thought content normal.        Judgment: Judgment normal.           Assessment & Plan:  HTN-blood pressure is stable.  Continue toprol xl 13m, hctz 266m lisinopril 4046m  Chronic nausea-she reports no improvement with use of as needed Zofran.  We will repeat abdominal ultrasound to assess gallbladder.  We will also reach out to her gastroenterologist to discuss possibility of gastric emptying study.  In the setting of diabetes I am concerned about the possibility of gastroparesis as cause for her nausea.  In regards to her chronic constipation, I wonder if her daily iron use may be contributing to this.  I have advised her to discontinue iron for now and instead switch to multivitamin with minerals.  Obtain labs as ordered.  Iron deficiency anemia-see above discussion re: discontinuation of iron supplement.  Will check CBC and iron studies today.  They were previously within normal limits.  GERD-fair control, she is maintained on Protonix 40 mg and both of which are being monitored and prescribed by gastroenterology.  She is due for the Shingrix vaccine, I have sent a prescription to her pharmacy.  This visit occurred during the SARS-CoV-2 public health emergency.  Safety protocols were in place, including screening questions prior to the visit, additional usage of staff PPE, and extensive cleaning of exam room while observing appropriate contact time as indicated for disinfecting solutions.     Diabetes type 2-clinically stable.  Obtain follow-up A1c.

## 2020-05-04 NOTE — Patient Instructions (Signed)
Stop iron. Start multivitamin with minerals once daily instead.

## 2020-05-05 ENCOUNTER — Telehealth: Payer: Self-pay | Admitting: Family

## 2020-05-05 DIAGNOSIS — R110 Nausea: Secondary | ICD-10-CM

## 2020-05-05 NOTE — Telephone Encounter (Signed)
See mychart.  

## 2020-05-14 ENCOUNTER — Other Ambulatory Visit: Payer: Self-pay

## 2020-05-14 MED ORDER — ONDANSETRON HCL 4 MG PO TABS: 4.0000 mg | ORAL_TABLET | Freq: Two times a day (BID) | ORAL | 0 refills | Status: DC

## 2020-05-14 MED ORDER — FAMOTIDINE 20 MG PO TABS: ORAL_TABLET | ORAL | 0 refills | Status: DC

## 2020-05-20 ENCOUNTER — Encounter (HOSPITAL_BASED_OUTPATIENT_CLINIC_OR_DEPARTMENT_OTHER): Payer: Self-pay

## 2020-05-20 ENCOUNTER — Ambulatory Visit (HOSPITAL_BASED_OUTPATIENT_CLINIC_OR_DEPARTMENT_OTHER)
Admission: RE | Admit: 2020-05-20 | Discharge: 2020-05-20 | Disposition: A | Discharge status: Home / Self Care | Payer: Medicare Other | Source: Ambulatory Visit | Attending: Family | Admitting: Family

## 2020-05-20 ENCOUNTER — Other Ambulatory Visit: Payer: Self-pay

## 2020-05-20 DIAGNOSIS — R110 Nausea: Secondary | ICD-10-CM | POA: Diagnosis not present

## 2020-05-20 DIAGNOSIS — K760 Fatty (change of) liver, not elsewhere classified: Secondary | ICD-10-CM | POA: Diagnosis not present

## 2020-05-20 DIAGNOSIS — Z1231 Encounter for screening mammogram for malignant neoplasm of breast: Secondary | ICD-10-CM | POA: Diagnosis not present

## 2020-05-23 ENCOUNTER — Encounter: Payer: Self-pay | Admitting: Family

## 2020-05-23 DIAGNOSIS — K760 Fatty (change of) liver, not elsewhere classified: Secondary | ICD-10-CM | POA: Insufficient documentation

## 2020-05-27 ENCOUNTER — Other Ambulatory Visit: Payer: Self-pay | Admitting: Family

## 2020-05-28 ENCOUNTER — Ambulatory Visit (HOSPITAL_COMMUNITY)
Admission: RE | Admit: 2020-05-28 | Discharge: 2020-05-28 | Disposition: A | Discharge status: Home / Self Care | Payer: Medicare Other | Source: Ambulatory Visit | Attending: Family | Admitting: Family

## 2020-05-28 ENCOUNTER — Other Ambulatory Visit: Payer: Self-pay

## 2020-05-28 DIAGNOSIS — R110 Nausea: Secondary | ICD-10-CM | POA: Diagnosis not present

## 2020-05-28 MED ORDER — TECHNETIUM TC 99M SULFUR COLLOID
2.0000 | Freq: Once | INTRAVENOUS | Status: AC | PRN
  Administered 2020-05-28: 2 via INTRAVENOUS

## 2020-05-30 ENCOUNTER — Other Ambulatory Visit: Payer: Self-pay | Admitting: Family

## 2020-06-05 ENCOUNTER — Other Ambulatory Visit: Payer: Self-pay | Admitting: Family

## 2020-07-15 ENCOUNTER — Other Ambulatory Visit: Payer: Self-pay | Admitting: Gastroenterology

## 2020-07-20 ENCOUNTER — Ambulatory Visit (INDEPENDENT_AMBULATORY_CARE_PROVIDER_SITE_OTHER): Payer: Medicare Other | Admitting: Pharmacist

## 2020-07-20 ENCOUNTER — Telehealth: Payer: Self-pay | Admitting: Pharmacist

## 2020-07-20 DIAGNOSIS — E119 Type 2 diabetes mellitus without complications: Secondary | ICD-10-CM | POA: Diagnosis not present

## 2020-07-20 DIAGNOSIS — M1990 Unspecified osteoarthritis, unspecified site: Secondary | ICD-10-CM

## 2020-07-20 DIAGNOSIS — I10 Essential (primary) hypertension: Secondary | ICD-10-CM | POA: Diagnosis not present

## 2020-07-20 DIAGNOSIS — R110 Nausea: Secondary | ICD-10-CM

## 2020-07-20 NOTE — Chronic Care Management (AMB) (Signed)
Chronic Care Management Pharmacy Note  07/20/2020 Name:  Cassidy Harrell MRN:  [id number] DOB:  April 14, 1950  Subjective: Cassidy Harrell is an 70 y.o. year old female who is a primary patient of [name], NP.  The CCM team was consulted for assistance with disease management and care coordination needs.    Engaged with patient by telephone for follow up visit in response to provider referral for pharmacy case management and/or care coordination services.   Consent to Services:  The patient was given information about Chronic Care Management services, agreed to services, and gave verbal consent prior to initiation of services.  Please see initial visit note for detailed documentation.   Patient Care Team: [name], NP as PCP - General (Internal Medicine) Karie Chimera, MD as Consulting Physician (Neurosurgery)  Recent office visits: 05/04/2020 - PCP Edwena Blow) - f/u chronic conditions; Stopped iron supplement due to constipation; Start MVI daily; Rx for Shingrix vaccine.   Recent consult visits: 04/19/2020 - GI ( Dr Ardis Hughs) - f/u belching, early satiety, anorexia; restart pantoprazole 40 mg pills shortly before breakfast every morning.  I gave her a new prescription for that.  She is going to continue taking her famotidine 20 mg 1 pill at bedtime every night.  I refilled her Zofran prescription 4 mg pills 1 pill twice daily as needed for nausea.   Hospital visits: None in previous 6 months  Objective:  Lab Results  Component Value Date   CREATININE 0.96 05/04/2020   CREATININE 0.89 11/05/2019   CREATININE 0.91 09/19/2019    Lab Results  Component Value Date   HGBA1C 6.9 (H) 05/04/2020   Last diabetic Eye exam:  Lab Results  Component Value Date/Time   HMDIABEYEEXA No Retinopathy 01/13/2020 12:00 AM    Last diabetic Foot exam: No results found for: HMDIABFOOTEX      Component Value Date/Time   CHOL 82 10/03/2016 1319   TRIG 94.0 10/03/2016  1319   HDL 50.00 10/03/2016 1319   CHOLHDL 2 10/03/2016 1319   VLDL 18.8 10/03/2016 1319   LDLCALC 13 10/03/2016 1319    Hepatic Function Latest Ref Rng & Units 05/04/2020 11/05/2019 04/02/2019  Total Protein 6.0 - 8.3 g/dL 7.1 7.0 7.4  Albumin 3.5 - 5.2 g/dL 4.2 - 3.3(L)  AST 0 - 37 U/L _0 ALT 0 - 35 U/L _1 Alk Phosphatase 39 - 117 U/L 59 - 77  Total Bilirubin 0.2 - 1.2 mg/dL 0.3 0.3 0.6  Bilirubin, Direct 0.0 - 0.3 mg/dL - - -    Lab Results  Component Value Date/Time   TSH 3.74 11/05/2018 02:36 PM   TSH 4.26 04/04/2017 10:04 AM   FREET4 1.14 11/15/2007 09:02 PM    CBC Latest Ref Rng & Units 05/04/2020 11/05/2019 06/25/2019  WBC 4.0 - 10.5 K/uL 5.0 5.9 5.2  Hemoglobin 12.0 - 15.0 g/dL 12.2 12.2 13.2  Hematocrit 36.0 - 46.0 % 36.8 36.8 42.2  Platelets 150.0 - 400.0 K/uL 189.0 204 220    No results found for: VD25OH  Clinical ASCVD: No  The ASCVD Risk score Mikey Bussing DC Jr., et al., 2013) failed to calculate for the following reasons:   Cannot find a previous HDL lab   Cannot find a previous total cholesterol lab    Other: (CHADS2VASc if Afib, PHQ9 if depression, MMRC or CAT for COPD, ACT, DEXA)  Social History   Tobacco Use  Smoking Status Former Smoker  . Types: Cigarettes  .  Quit date: 2011  . Years since quitting: 11.4  Smokeless Tobacco Never Used   BP Readings from Last 3 Encounters:  05/04/20 130/65  04/19/20 130/60  01/27/20 130/60   Pulse Readings from Last 3 Encounters:  05/04/20 77  04/19/20 76  01/27/20 75   Wt Readings from Last 3 Encounters:  05/04/20 186 lb (84.4 kg)  04/19/20 186 lb 6 oz (84.5 kg)  01/27/20 189 lb 12.8 oz (86.1 kg)    Assessment: Review of patient past medical history, allergies, medications, health status, including review of consultants reports, laboratory and other test data, was performed as part of comprehensive evaluation and provision of chronic care management services.   SDOH:  (Social Determinants of Health)  assessments and interventions performed:  SDOH Interventions   Flowsheet Row Most Recent Value  SDOH Interventions   Financial Strain Interventions Intervention Not Indicated  Physical Activity Interventions Other (Comments)  [Patient states she hates exericse but knows she needs to start. Encouraged her to starts with 5 to 10 minutes per day of any activity she enjoys and increase as able to goal of at least 150 minutes per week.]      CCM Care Plan  Allergies  Allergen Reactions  . Aldactone [Spironolactone] Other (See Comments)    itching    Medications Reviewed Today    Reviewed by [name], PharmD (Pharmacist) on 07/20/20 at 76  Med List Status: <None>  Medication Order Taking? Sig Documenting Provider Last Dose Status Informant  acetaminophen (TYLENOL) 650 MG CR tablet [id number] Yes Take 650 mg by mouth in the morning and at bedtime. [provider] Taking Active Self  aspirin EC 81 MG tablet [id number] Yes Take 81 mg by mouth daily with lunch. [provider] Taking Active Self  blood glucose meter kit and supplies KIT [id number] Yes Dispense based on patient and insurance preference. Use up to four times daily as directed. (FOR ICD-9 250.00, 250.01). [name], NP Taking Active Self  calcium carbonate (OSCAL) 1500 (600 Ca) MG TABS tablet [id number] Yes Take 1,200 mg by mouth daily with lunch. 800 units vitamin D [provider] Taking Active Self  famotidine (PEPCID) 20 MG tablet [id number] Yes TAKE 1 TABLET BY MOUTH AT  BEDTIME FOR BURPING Milus Banister, MD Taking Active   hydrochlorothiazide (HYDRODIURIL) 25 MG tablet [id number] Yes Take 1 tablet (25 mg total) by mouth daily. [name], NP Taking Active   lisinopril (ZESTRIL) 40 MG tablet [id number] Yes TAKE 1 TABLET BY MOUTH  DAILY [name], NP Taking Active   meloxicam (MOBIC) 7.5 MG tablet [id number] Yes TAKE 1 TABLET BY MOUTH  DAILY [name], NP Taking  Active   metoprolol succinate (TOPROL-XL) 50 MG 24 hr tablet [id number] Yes Take 1.5 tablets (75 mg total) by mouth daily. Take with or immediately following a meal. [name], NP Taking Active   Multiple Vitamins-Minerals (MULTIVITAMIN WITH MINERALS) tablet [id number] Yes Take 1 tablet by mouth daily. [name], NP Taking Active   ondansetron (ZOFRAN) 4 MG tablet [id number] No Take 1 tablet (4 mg total) by mouth 2 (two) times daily. As needed  Patient not taking: Reported on 07/20/2020   Milus Banister, MD Not Taking Active   pantoprazole (PROTONIX) 40 MG tablet [id number] Yes TAKE 1 TABLET BY MOUTH  DAILY [name], NP Taking Active   polyethylene glycol (MIRALAX / GLYCOLAX) 17 g packet [id number] Yes Take 17 g by mouth daily. [provider] Taking Active   Zoster Vaccine  Adjuvanted [facility]) injection [id number] No Inject 0.96m IM now and again in 2-6 months.  Patient not taking: Reported on 07/20/2020   ODebbrah Alar NP Not Taking Active           Patient Active Problem List   Diagnosis Date Noted  . Fatty liver   . Acute perforated appendicitis 04/02/2019  . Chest pain 06/30/2015  . Heart palpitations 06/30/2015  . Hypokalemia 06/07/2015  . Cellulitis and abscess of trunk 11/05/2014  . Myalgia and myositis 05/28/2014  . Allergic rhinitis 05/28/2014  . Thyromegaly 11/24/2013  . Foot pain 11/24/2013  . Anemia, iron deficiency 08/25/2013  . Diabetes type 2, controlled ([place] 08/25/2013  . Other malaise and fatigue 10/14/2012  . Cervical disc disease 06/06/2012  . Routine general medical examination at a health care facility 11/13/2011  . Hypothyroid 10/24/2011  . Arthritis 09/25/2011  . Depression 09/25/2011  . History of syphilis 09/25/2011  . HTN (hypertension) 09/25/2011    Immunization History  Administered Date(s) Administered  . Fluad Quad(high Dose 65+) 11/05/2018  . Influenza Split 11/13/2011  . Influenza, High Dose  Seasonal PF 03/10/2016, 01/02/2017, 02/08/2018  . Influenza,inj,Quad PF,6+ Mos 11/24/2013  . Influenza-Unspecified 11/27/2012, 12/03/2019  . PFIZER(Purple Top)SARS-COV-2 Vaccination 04/12/2019, 05/06/2019, 12/03/2019  . Pneumococcal Conjugate-13 04/04/2017  . Pneumococcal Polysaccharide-23 11/24/2013, 10/31/2019  . Tdap 04/01/2013  . Zoster 05/28/2015    Conditions to be addressed/monitored: HTN, DMII and chronic nausea / GERD; pain; anemia;   Care Plan : General Pharmacy (Adult)  Updates made by ECherre Robins PHARMD since 07/20/2020 12:00 AM    Problem: Chronic Disease Management support, education, and care coordination needs related to Diabetes, Hypertension, Pain   Priority: Medium  Onset Date: 07/20/2020  Note:   Current Barriers:  . Not meeting physical activity goals . Chronic Disease Management support, education, and care coordination needs related to Diabetes, Hypertension, Pain  Pharmacist Clinical Goal(s):  .Marland KitchenOver the next 180 days, patient will achieve adherence to monitoring guidelines and medication adherence to achieve therapeutic efficacy . maintain control of type 2 DM, HTN and pain as evidenced by attainment of goals listed below  . Increase physical activity and weight management  through collaboration with PharmD and provider.   Interventions: . 1:1 collaboration with ODebbrah Alar NP regarding development and update of comprehensive plan of care as evidenced by provider attestation and co-signature . Inter-disciplinary care team collaboration (see longitudinal plan of care) . Comprehensive medication review performed; medication list updated in electronic medical record  Hypertension . Controlled per lat 6 months of office BP readings; BP goal <140/90 . Recent home BP reading - checking 1 to 2 time per month o 148/67; 128/38; 144/76; 120/67 . Current regimen:  o Lisinopril 468mdaily o Metoprolol succinate 2579maily o hydrochlorothiazide 15m45maily . Interventions: o Reviewed BP goal o Requested patient to check blood pressure once per week and record o Ensure daily salt intake < 2300 mg/day  Diabetes . A1c has remained stable and at goal of <7% over the last 6 months . No statin due to low LDL - last LDL was 13  on 05/03/2016 . Recent home BG readings = 119, 111, 141, 145, 119 . Current regimen:  o Diet and exercise management   . Interventions: o Reviewed diabetic diet  - Limit sugar, bread, rice, pasta and potato intake - Increase non starch vegetable and fruits - Continue to choose lean proteins like fish and chicken and avoid fried foods.  o Reviewed home  blood glucose readings and reviewed goals  - Fasting blood glucose goal (before meals) = 80 to 130 - Blood glucose goal after a meal = less than 180  o Requested patient to check blood sugar 3 times per week and record o Discussed increasing exercise / physical activity. Recommended she start with 5 to 10 minutes per day of any activity you enjoy and increase as able to goal of at least 150 minutes per week.  Chronic Nausea . Managed by GI - Dr Ardis Hughs . Patient reports that reflux had worsened in early 2022 when she had stopped pantoprazole but restarted at Dr Ardis Hughs request in Feb 2022. Patient states she only has to use famotidine a few times per month and has not taken ondansetron in several weeks . Current regimen:  . Pantoprazole 90m daily noon . Famotidine 2686mdaily bedtime if needed . Ondansetron as needed for nausea . Interventions o Continue to follow up with GI  o Continue current regiment or reflux o Consider checking B12 and Mg with next labs since on chronic PPI therapy   Pain . Patient states that pain is current well controlled . Locations of pain is in back and joints . Current regimen:  o Meloxicam 7.86m33maily o Acetaminophen 650m52mice a day . Interventions: o Maintain pain medication regimen  Medication management . Current  pharmacy: [facility] o Comprehensive medication review performed. o Continue current medication management strategy o Reviewed refill history and assessed adherence; no issues found  Health Maintenance:  . Discussed Shingrix vaccine -patient has not gotten due to cost. Recommend reassess cost in 2023 . Discussed 2nd Covid booster - reccommended she get before Fall.   Patient Goals/Self-Care Activities . Over the next 180 days, patient will:  take medications as prescribed, check glucose 3 times per week, document, and provide at future appointments, check blood pressure weekly, document, and provide at future appointments, and target a minimum of 150 minutes of moderate intensity exercise weekly  Follow Up Plan: Telephone follow up appointment with care management team member scheduled for:  6 months        Medication Assistance: None required.  Patient affirms current coverage meets needs.  [id number] [place] [id number]: [phone]: [phone] [address]: [phone]: [phone]/pharmacy #[id number] - [address]: [phone] Up:  Patient agrees to Care Plan and Follow-up.  Plan: Telephone follow up appointment with care management team member scheduled for:  6 months  TammyCherre RobinsrmD Clinical Pharmacist [phone]

## 2020-07-20 NOTE — Telephone Encounter (Signed)
Opened in error

## 2020-07-20 NOTE — Patient Instructions (Signed)
Visit Information Cassidy Harrell,  It was a pleasure speaking with you today. Please feel free to contact me if you have any questions or concerns. Below is information regarding you health goals.   Keep up the good work and try to increase activity / use your treadmill!  [name], PharmD Clinical Pharmacist [facility] [phone]  PATIENT GOALS: Goals Addressed            This Visit's Progress   . Chronic Care Management Pharmacy Care Plan   On track    CARE PLAN ENTRY (see longitudinal plan of care for additional care plan information)  Current Barriers:  . Chronic Disease Management support, education, and care coordination needs related to Diabetes, Hypertension, Pain   Hypertension BP Readings from Last 3 Encounters:  05/04/20 130/65  04/19/20 130/60  01/27/20 130/60   . Pharmacist Clinical Goal(s): o Over the next 180 days, patient will work with PharmD and providers to maintain BP goal <140/90 . Current regimen:  o Lisinopril 40mg  daily o Metoprolol succinate 25mg  daily o hydrochlorothiazide 25mg  daily . Interventions: o Requested patient to check blood pressure once per week and record . Patient self care activities - Over the next 90 days, patient will: o Check blood pressure weekly, document, and provide at future appointments o Ensure daily salt intake < 2300 mg/day  Diabetes Lab Results  Component Value Date/Time   HGBA1C 6.9 (H) 05/04/2020 11:49 AM   HGBA1C 6.6 (H) 11/05/2019 01:55 PM   . Pharmacist Clinical Goal(s): o Over the next 180 days, patient will work with PharmD and providers to maintain A1c goal <7% . Current regimen:  o Diet and exercise management   . Interventions: o Reviewed diabetic diet  - Limit sugar, bread, rice, pasta and potato intake - Increase non starch vegetable and fruits - Continue to choose lean proteins like fish and chicken and avoid fried foods.  o Requested patient to check  blood sugar 3 times per week and record o Discussed increasing exercise . Patient self care activities - Over the next 90 days, patient will: o Check blood sugar three times per week document, and provide at future appointments o Increase exercise / physical activity. Start with 5 to 10 minutes per day of any activity you enjoy and increase as able to goal of at least 150 minutes per week.  Chronic Nausea . Pharmacist Clinical Goal(s) o Over the next 180 days, patient will work with PharmD and providers to reduce nausea symptoms . Current regimen:  . Pantoprazole 40mg  daily noon . Famotidine 20mg  daily bedtime if needed . Ondansetron as needed for nausea . Patient self care activities - Over the next 180 days, patient will: o Continue to follow up with GI   Pain . Pharmacist Clinical Goal(s) o Over the next 90 days, patient will work with PharmD and providers to reduce patient's symptoms of pain . Current regimen:  o Meloxicam 7.5mg  daily o Acetaminophen 650mg  twice a day . Patient self care activities - Over the next 90 days, patient will: o Maintain pain medication regimen  Medication management . Pharmacist Clinical Goal(s): o Over the next 90 days, patient will work with PharmD and providers to maintain optimal medication adherence . Current pharmacy: [facility] . Interventions o Comprehensive medication review performed. o Continue current medication management strategy . Patient self care activities - Over the next 90 days, patient will: o Focus on medication adherence by filling medications appropriately  o Take medications as prescribed o Report any questions or concerns to PharmD and/or provider(s)  Please see past updates related to this goal by clicking on the "Past Updates" button in the selected goal      . Increase physical activity   Not on track    Treadmill 3x/ week. 10 minutes at a time.       Patient verbalizes understanding of instructions  provided today and agrees to view in [place].   Telephone follow up appointment with care management team member scheduled for: 6 months.

## 2020-07-30 ENCOUNTER — Other Ambulatory Visit: Payer: Self-pay | Admitting: Gastroenterology

## 2020-08-18 ENCOUNTER — Ambulatory Visit (INDEPENDENT_AMBULATORY_CARE_PROVIDER_SITE_OTHER): Payer: Medicare Other

## 2020-08-18 VITALS — Ht 63.0 in | Wt 186.0 lb

## 2020-08-18 DIAGNOSIS — Z780 Asymptomatic menopausal state: Secondary | ICD-10-CM

## 2020-08-18 DIAGNOSIS — Z0000 Encounter for general adult medical examination without abnormal findings: Secondary | ICD-10-CM | POA: Diagnosis not present

## 2020-08-18 NOTE — Progress Notes (Signed)
Subjective:   Cassidy Harrell is a 70 y.o. female who presents for Medicare Annual (Subsequent) preventive examination.  I connected with Mekayla today by telephone and verified that I am speaking with the correct person using two identifiers. Location patient: home Location provider: work Persons participating in the virtual visit: patient, Marine scientist.    I discussed the limitations, risks, security and privacy concerns of performing an evaluation and management service by telephone and the availability of in person appointments. I also discussed with the patient that there may be a patient responsible charge related to this service. The patient expressed understanding and verbally consented to this telephonic visit.    Interactive audio and video telecommunications were attempted between this provider and patient, however failed, due to patient having technical difficulties OR patient did not have access to video capability.  We continued and completed visit with audio only.  Some vital signs may be absent or patient reported.   Time Spent with patient on telephone encounter: 20 minutes   Review of Systems     Cardiac Risk Factors include: advanced age (>54mn, >>33women);diabetes mellitus;hypertension;obesity (BMI >30kg/m2);sedentary lifestyle     Objective:    Today's Vitals   08/18/20 1503  Weight: 186 lb (84.4 kg)  Height: '5\' 3"'  (1.6 m)   Body mass index is 32.95 kg/m.  Advanced Directives 08/18/2020 08/18/2019 06/25/2019 04/02/2019 04/01/2019  Does Patient Have a Medical Advance Directive? Yes Yes Yes Yes Yes  Type of AParamed[place]of [place]Living will [place] LakeLiving will [place]Living will H[place]fl[place]Living will -  Does patient want to make changes to medical advance directive? - No - Patient declined - No - Patient declined -  Copy of [place] Chart? No - copy requested No  - copy requested No - copy requested No - copy requested -    Current Medications (verified) Outpatient Encounter Medications as of 08/18/2020  Medication Sig   acetaminophen (TYLENOL) 650 MG CR tablet Take 650 mg by mouth in the morning and at bedtime.   aspirin EC 81 MG tablet Take 81 mg by mouth daily with lunch.   blood glucose meter kit and supplies KIT Dispense based on patient and insurance preference. Use up to four times daily as directed. (FOR ICD-9 250.00, 250.01).   calcium carbonate (OSCAL) 1500 (600 Ca) MG TABS tablet Take 1,200 mg by mouth daily with lunch. 800 units vitamin D   famotidine (PEPCID) 20 MG tablet TAKE 1 TABLET BY MOUTH AT  BEDTIME FOR BURPING   hydrochlorothiazide (HYDRODIURIL) 25 MG tablet Take 1 tablet (25 mg total) by mouth daily.   lisinopril (ZESTRIL) 40 MG tablet TAKE 1 TABLET BY MOUTH  DAILY   meloxicam (MOBIC) 7.5 MG tablet TAKE 1 TABLET BY MOUTH  DAILY   metoprolol succinate (TOPROL-XL) 50 MG 24 hr tablet Take 1.5 tablets (75 mg total) by mouth daily. Take with or immediately following a meal.   Multiple Vitamins-Minerals (MULTIVITAMIN WITH MINERALS) tablet Take 1 tablet by mouth daily.   ondansetron (ZOFRAN) 4 MG tablet TAKE 1 TABLET BY MOUTH  TWICE DAILY AS NEEDED   pantoprazole (PROTONIX) 40 MG tablet TAKE 1 TABLET BY MOUTH  DAILY   polyethylene glycol (MIRALAX / GLYCOLAX) 17 g packet Take 17 g by mouth daily.   [DISCONTINUED] Zoster Vaccine Adjuvanted ([facility] injection Inject 0.573mIM now and again in 2-6 months. (Patient not taking: Reported on 07/20/2020)   No  facility-administered encounter medications on file as of 08/18/2020.    Allergies (verified) Aldactone [spironolactone]   History: Past Medical History:  Diagnosis Date   Anemia    Arthritis    Depression    Diabetes mellitus without complication (HCC)    Dysrhythmia    Over 10 years ago   Fatty liver    Gum disease    Heart murmur    History of syphilis    Hypertension     Irregular heart beat    Personal history of colonic polyps    Phlebitis    Pre-diabetes    Past Surgical History:  Procedure Laterality Date   CYSTECTOMY     between breasts   IR RADIOLOGIST EVAL & MGMT  04/15/2019   IR RADIOLOGIST EVAL & MGMT  04/29/2019   IR RADIOLOGIST EVAL & MGMT  05/08/2019   LAPAROSCOPIC APPENDECTOMY N/A 06/30/2019   Procedure: APPENDECTOMY LAPAROSCOPIC;  Surgeon: Ralene Ok, MD;  Location: [facility];  Service: General;  Laterality: N/A;   SHOULDER ARTHROSCOPY Right 12/2015   SPINE SURGERY  08/20/2015   bulging / herniated disc in C-spine   [place], 1978   Family History  Problem Relation Age of Onset   Diabetes Mother    Cancer Father        lung cancer   Kidney disease Father    Seizures Father    Colon cancer Brother    Heart attack Brother    Stroke Brother    Stomach cancer Neg Hx    Esophageal cancer Neg Hx    Rectal cancer Neg Hx    Social History   Socioeconomic History   Marital status: Divorced    Spouse name: Not on file   Number of children: 1   Years of education: Not on file   Highest education level: Not on file  Occupational History   Not on file  Tobacco Use   Smoking status: Former    Packs/day: 1.00    Years: 40.00    Pack years: 40.00    Types: Cigarettes    Quit date: 2011    Years since quitting: 11.4   Smokeless tobacco: Never  Vaping Use   Vaping Use: Never used  Substance and Sexual Activity   Alcohol use: No   Drug use: No   Sexual activity: Not on file  Other Topics Concern   Not on file  Social History Narrative   Regular exercise:  No   Caffeine Use:  No   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    Divorced   Son Age 63- alive and well   2 grand daughters- age 81 and an adopted Curator age 63   [place] reading, walking spending time with family, 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[employer]            Social  Determinants of Radio broadcast assistant Strain: Low Risk    Difficulty of Paying Living Expenses: Not hard at all  Food Insecurity: No Food Insecurity   Worried About Charity fundraiser in the Last Year: Never true   Arboriculturist in the Last Year: Never true  Transportation Needs: No Transportation Needs   Lack of Transportation (Medical): No   Lack of Transportation (Non-Medical): No  Physical Activity: Inactive   Days of Exercise per Week: 0 days   Minutes of Exercise per Session: 0 min  Stress: No  Stress Concern Present   Feeling of Stress : Only a little  Social Connections: Moderately Integrated   Frequency of Communication with Friends and Family: More than three times a week   Frequency of Social Gatherings with Friends and Family: More than three times a week   Attends Religious Services: More than 4 times per year   Active Member of [employer] or Organizations: Yes   Attends Music therapist: More than 4 times per year   Marital Status: Divorced    Tobacco Counseling Counseling given: Not Answered   Clinical Intake:  Pre-visit preparation completed: No  Pain : No/denies pain     Nutritional Status: BMI > 30  Obese Nutritional Risks: None Diabetes: Yes CBG done?: No Did pt. bring in CBG monitor from home?: No (phone visit)  How often do you need to have someone help you when you read instructions, pamphlets, or other written materials from your doctor or pharmacy?: 1 - Never  Diabetes:  Is the patient diabetic?  Yes  If diabetic, was a CBG obtained today?  No  Did the patient bring in their glucometer from home?  No phone visit How often do you monitor your CBG's? occasionally.   Financial Strains and Diabetes Management:  Are you having any financial strains with the device, your supplies or your medication? No .  Does the patient want to be seen by Chronic Care Management for management of their diabetes?  No  Would the patient like to  be referred to a Nutritionist or for Diabetic Management?  No   Diabetic Exams:  Diabetic Eye Exam: Completed 01/13/2020.   Diabetic Foot Exam: Pt has been advised about the importance in completing this exam. To be completed by PCP .    Interpreter Needed?: No  Information entered by :: Caroleen Hamman LPN   Activities of Daily Living In your present state of health, do you have any difficulty performing the following activities: 08/18/2020  Hearing? N  Vision? N  Difficulty concentrating or making decisions? N  Walking or climbing stairs? N  Dressing or bathing? N  Doing errands, shopping? N  Preparing Food and eating ? N  Using the Toilet? N  In the past six months, have you accidently leaked urine? N  Do you have problems with loss of bowel control? N  Managing your Medications? N  Managing your Finances? N  Housekeeping or managing your Housekeeping? N  Some recent data might be hidden    Patient Care Team: [name], NP as PCP - General (Internal Medicine) Karie Chimera, MD as Consulting Physician (Neurosurgery)  Indicate any recent Medical Services you may have received from other than [facility] providers in the past year (date may be approximate).     Assessment:   This is a routine wellness examination for Seairra.  Hearing/Vision screen Hearing Screening - Comments:: No issues Vision Screening - Comments:: Wears glasses Last eye exam-02/2020  Dietary issues and exercise activities discussed: Current Exercise Habits: The patient does not participate in regular exercise at present, Exercise limited by: None identified   Goals Addressed             This Visit's Progress    Increase physical activity   Not on track    Treadmill 3x/ week. 10 minutes at a time.      Patient Stated       Eat healthier & lose some weight        Depression Screen PHQ 2/9 Scores  08/18/2020 08/18/2019 05/19/2015  PHQ - 2 Score 0 0 0    Fall Risk Fall Risk   08/18/2020 08/18/2019 04/04/2017 05/19/2015  Falls in the past year? 0 0 No No  Number falls in past yr: 0 0 - -  Injury with Fall? 0 0 - -  Follow up Falls prevention discussed Education provided;Falls prevention discussed - -    FALL RISK PREVENTION PERTAINING TO THE HOME:  Any stairs in or around the home? No  Home free of loose throw rugs in walkways, pet beds, electrical cords, etc? Yes  Adequate lighting in your home to reduce risk of falls? Yes   ASSISTIVE DEVICES UTILIZED TO PREVENT FALLS:  Life alert? No  Use of a cane, walker or w/c? No  Grab bars in the bathroom? Yes  Shower chair or bench in shower? No  Elevated toilet seat or a handicapped toilet? No   TIMED UP AND GO:  Was the test performed? No . Phone visit   Cognitive Function:Normal cognitive status assessed by  this Nurse Health Advisor. No abnormalities found.          Immunizations Immunization History  Administered Date(s) Administered   Fluad Quad(high Dose 65+) 11/05/2018   Influenza Split 11/13/2011   Influenza, High Dose Seasonal PF 03/10/2016, 01/02/2017, 02/08/2018   Influenza,inj,Quad PF,6+ Mos 11/24/2013   Influenza-Unspecified 11/27/2012, 12/03/2019   PFIZER(Purple Top)SARS-COV-2 Vaccination 04/12/2019, 05/06/2019, 12/03/2019   Pneumococcal Conjugate-13 04/04/2017   Pneumococcal Polysaccharide-23 11/24/2013, 10/31/2019   Tdap 04/01/2013   Zoster, Live 05/28/2015    TDAP status: Up to date  Flu Vaccine status: Up to date  Pneumococcal vaccine status: Up to date  Covid-19 vaccine status: Completed vaccines  Qualifies for Shingles Vaccine? Yes   Zostavax completed Yes   Shingrix Completed?: No.    Education has been provided regarding the importance of this vaccine. Patient has been advised to call insurance company to determine out of pocket expense if they have not yet received this vaccine. Advised may also receive vaccine at local pharmacy or Health Dept. Verbalized acceptance and  understanding.  Screening Tests Health Maintenance  Topic Date Due   Zoster Vaccines- Shingrix (1 of 2) Never done   FOOT EXAM  11/05/2019   COVID-19 Vaccine (4 - Booster for Pfizer series) 04/04/2020   INFLUENZA VACCINE  09/27/2020   HEMOGLOBIN A1C  11/04/2020   OPHTHALMOLOGY EXAM  01/12/2021   MAMMOGRAM  05/20/2021   COLONOSCOPY (Pts 45-18yr Insurance coverage will need to be confirmed)  12/30/2022   TETANUS/TDAP  04/02/2023   DEXA SCAN  Completed   Hepatitis C Screening  Completed   PNA vac Low Risk Adult  Completed   HPV VACCINES  Aged Out    Health Maintenance  Health Maintenance Due  Topic Date Due   Zoster Vaccines- Shingrix (1 of 2) Never done   FOOT EXAM  11/05/2019   COVID-19 Vaccine (4 - Booster for PGreensboroseries) 04/04/2020    Colorectal cancer screening: Type of screening: Colonoscopy. Completed 12/30/2019. Repeat every 3 years  Mammogram status: Completed Bilateral 05/20/2020. Repeat every year  Bone Density status: Ordered today. Pt provided with contact info and advised to call to schedule appt.  Lung Cancer Screening: (Low Dose CT Chest recommended if Age 70-80years, 30 pack-year currently smoking OR have quit w/in 15years.) does not qualify.    Additional Screening:  Hepatitis C Screening: Completed 05/19/2015  Vision Screening: Recommended annual ophthalmology exams for early detection of glaucoma and other disorders of  the eye. Is the patient up to date with their annual eye exam?  Yes  Who is the provider or what is the name of the office in which the patient attends annual eye exams? Pt unsure of name   Dental Screening: Recommended annual dental exams for proper oral hygiene  Community Resource Referral / Chronic Care Management: CRR required this visit?  No   CCM required this visit?  No      Plan:     I have personally reviewed and noted the following in the patient's chart:   Medical and social history Use of alcohol, tobacco or  illicit drugs  Current medications and supplements including opioid prescriptions.  Functional ability and status Nutritional status Physical activity Advanced directives List of other physicians Hospitalizations, surgeries, and ER visits in previous 12 months Vitals Screenings to include cognitive, depression, and falls Referrals and appointments  In addition, I have reviewed and discussed with patient certain preventive protocols, quality metrics, and best practice recommendations. A written personalized care plan for preventive services as well as general preventive health recommendations were provided to patient.   Due to this being a telephonic visit, the after visit summary with patients personalized plan was offered to patient via mail or my-chart. Patient would like to access on my-chart.   Marta Antu, LPN   10/06/1749  Nurse Health Advisor  Nurse Notes:  None

## 2020-08-18 NOTE — Patient Instructions (Signed)
Cassidy Harrell , Thank you for taking time to complete your Medicare Wellness Visit. I appreciate your ongoing commitment to your health goals. Please review the following plan we discussed and let me know if I can assist you in the future.   Screening recommendations/referrals: Colonoscopy: Completed 12/30/2019-Due 12/30/2022 Mammogram: Completed 05/20/2020-Due 05/20/2021 Bone Density: Ordered today. Someone will be calling you to schedule. Recommended yearly ophthalmology/optometry visit for glaucoma screening and checkup Recommended yearly dental visit for hygiene and checkup  Vaccinations: Influenza vaccine: Up to date Pneumococcal vaccine: Up to date Tdap vaccine: Up to date-Due-03/2023 Shingles vaccine: Discuss with pharmacy   Covid-19:Up to date  Advanced directives: Please bring a copy for your chart  Conditions/risks identified: See problem list  Next appointment: Follow up in one year for your annual wellness visit 08/23/2021 @ 3:00   Preventive Care 65 Years and Older, Female Preventive care refers to lifestyle choices and visits with your health care provider that can promote health and wellness. What does preventive care include? A yearly physical exam. This is also called an annual well check. Dental exams once or twice a year. Routine eye exams. Ask your health care provider how often you should have your eyes checked. Personal lifestyle choices, including: Daily care of your teeth and gums. Regular physical activity. Eating a healthy diet. Avoiding tobacco and drug use. Limiting alcohol use. Practicing safe sex. Taking low-dose aspirin every day. Taking vitamin and mineral supplements as recommended by your health care provider. What happens during an annual well check? The services and screenings done by your health care provider during your annual well check will depend on your age, overall health, lifestyle risk factors, and family history of disease. Counseling   Your health care provider may ask you questions about your: Alcohol use. Tobacco use. Drug use. Emotional well-being. Home and relationship well-being. Sexual activity. Eating habits. History of falls. Memory and ability to understand (cognition). Work and work Statistician. Reproductive health. Screening  You may have the following tests or measurements: Height, weight, and BMI. Blood pressure. Lipid and cholesterol levels. These may be checked every 5 years, or more frequently if you are over 6 years old. Skin check. Lung cancer screening. You may have this screening every year starting at age 44 if you have a 30-pack-year history of smoking and currently smoke or have quit within the past 15 years. Fecal occult blood test (FOBT) of the stool. You may have this test every year starting at age 39. Flexible sigmoidoscopy or colonoscopy. You may have a sigmoidoscopy every 5 years or a colonoscopy every 10 years starting at age 42. Hepatitis C blood test. Hepatitis B blood test. Sexually transmitted disease (STD) testing. Diabetes screening. This is done by checking your blood sugar (glucose) after you have not eaten for a while (fasting). You may have this done every 1-3 years. Bone density scan. This is done to screen for osteoporosis. You may have this done starting at age 90. Mammogram. This may be done every 1-2 years. Talk to your health care provider about how often you should have regular mammograms. Talk with your health care provider about your test results, treatment options, and if necessary, the need for more tests. Vaccines  Your health care provider may recommend certain vaccines, such as: Influenza vaccine. This is recommended every year. Tetanus, diphtheria, and acellular pertussis (Tdap, Td) vaccine. You may need a Td booster every 10 years. Zoster vaccine. You may need this after age 3. Pneumococcal 13-valent conjugate (PCV13)  vaccine. One dose is recommended  after age 1. Pneumococcal polysaccharide (PPSV23) vaccine. One dose is recommended after age 68. Talk to your health care provider about which screenings and vaccines you need and how often you need them. This information is not intended to replace advice given to you by your health care provider. Make sure you discuss any questions you have with your health care provider. Document Released: 03/12/2015 Document Revised: 11/03/2015 Document Reviewed: 12/15/2014 Elsevier Interactive Patient Education  2017 [place] Prevention in the Home Falls can cause injuries. They can happen to people of all ages. There are many things you can do to make your home safe and to help prevent falls. What can I do on the outside of my home? Regularly fix the edges of walkways and driveways and fix any cracks. Remove anything that might make you trip as you walk through a door, such as a raised step or threshold. Trim any bushes or trees on the path to your home. Use bright outdoor lighting. Clear any walking paths of anything that might make someone trip, such as rocks or tools. Regularly check to see if handrails are loose or broken. Make sure that both sides of any steps have handrails. Any raised decks and porches should have guardrails on the edges. Have any leaves, snow, or ice cleared regularly. Use sand or salt on walking paths during winter. Clean up any spills in your garage right away. This includes oil or grease spills. What can I do in the bathroom? Use night lights. Install grab bars by the toilet and in the tub and shower. Do not use towel bars as grab bars. Use non-skid mats or decals in the tub or shower. If you need to sit down in the shower, use a plastic, non-slip stool. Keep the floor dry. Clean up any water that spills on the floor as soon as it happens. Remove soap buildup in the tub or shower regularly. Attach bath mats securely with double-sided non-slip rug tape. Do not  have throw rugs and other things on the floor that can make you trip. What can I do in the bedroom? Use night lights. Make sure that you have a light by your bed that is easy to reach. Do not use any sheets or blankets that are too big for your bed. They should not hang down onto the floor. Have a firm chair that has side arms. You can use this for support while you get dressed. Do not have throw rugs and other things on the floor that can make you trip. What can I do in the kitchen? Clean up any spills right away. Avoid walking on wet floors. Keep items that you use a lot in easy-to-reach places. If you need to reach something above you, use a strong step stool that has a grab bar. Keep electrical cords out of the way. Do not use floor polish or wax that makes floors slippery. If you must use wax, use non-skid floor wax. Do not have throw rugs and other things on the floor that can make you trip. What can I do with my stairs? Do not leave any items on the stairs. Make sure that there are handrails on both sides of the stairs and use them. Fix handrails that are broken or loose. Make sure that handrails are as long as the stairways. Check any carpeting to make sure that it is firmly attached to the stairs. Fix any carpet that is loose  or worn. Avoid having throw rugs at the top or bottom of the stairs. If you do have throw rugs, attach them to the floor with carpet tape. Make sure that you have a light switch at the top of the stairs and the bottom of the stairs. If you do not have them, ask someone to add them for you. What else can I do to help prevent falls? Wear shoes that: Do not have high heels. Have rubber bottoms. Are comfortable and fit you well. Are closed at the toe. Do not wear sandals. If you use a stepladder: Make sure that it is fully opened. Do not climb a closed stepladder. Make sure that both sides of the stepladder are locked into place. Ask someone to hold it for  you, if possible. Clearly mark and make sure that you can see: Any grab bars or handrails. First and last steps. Where the edge of each step is. Use tools that help you move around (mobility aids) if they are needed. These include: Canes. Walkers. Scooters. Crutches. Turn on the lights when you go into a dark area. Replace any light bulbs as soon as they burn out. Set up your furniture so you have a clear path. Avoid moving your furniture around. If any of your floors are uneven, fix them. If there are any pets around you, be aware of where they are. Review your medicines with your doctor. Some medicines can make you feel dizzy. This can increase your chance of falling. Ask your doctor what other things that you can do to help prevent falls. This information is not intended to replace advice given to you by your health care provider. Make sure you discuss any questions you have with your health care provider. Document Released: 12/10/2008 Document Revised: 07/22/2015 Document Reviewed: 03/20/2014 Elsevier Interactive Patient Education  2017 [employer].

## 2020-08-31 ENCOUNTER — Other Ambulatory Visit: Payer: Self-pay | Admitting: Family

## 2020-09-12 ENCOUNTER — Other Ambulatory Visit: Payer: Self-pay | Admitting: Family

## 2020-09-14 ENCOUNTER — Telehealth: Payer: Self-pay | Admitting: Pharmacist

## 2020-09-14 ENCOUNTER — Other Ambulatory Visit: Payer: Self-pay

## 2020-09-14 ENCOUNTER — Ambulatory Visit (HOSPITAL_BASED_OUTPATIENT_CLINIC_OR_DEPARTMENT_OTHER)
Admission: RE | Admit: 2020-09-14 | Discharge: 2020-09-14 | Disposition: A | Discharge status: Home / Self Care | Payer: Medicare Other | Source: Ambulatory Visit | Attending: Family | Admitting: Family

## 2020-09-14 DIAGNOSIS — Z780 Asymptomatic menopausal state: Secondary | ICD-10-CM | POA: Diagnosis not present

## 2020-09-14 NOTE — Chronic Care Management (AMB) (Signed)
    Chronic Care Management Pharmacy Assistant   Name: Cassidy Harrell  MRN: [id number] DOB: [birth year]  Reason for Encounter: General Adherence Call  Recent office visits:  No visits noted   Recent consult visits:  No visits noted   Hospital visits:  None in previous 6 months  Medications: Outpatient Encounter Medications as of 09/14/2020  Medication Sig   acetaminophen (TYLENOL) 650 MG CR tablet Take 650 mg by mouth in the morning and at bedtime.   aspirin EC 81 MG tablet Take 81 mg by mouth daily with lunch.   blood glucose meter kit and supplies KIT Dispense based on patient and insurance preference. Use up to four times daily as directed. (FOR ICD-9 250.00, 250.01).   calcium carbonate (OSCAL) 1500 (600 Ca) MG TABS tablet Take 1,200 mg by mouth daily with lunch. 800 units vitamin D   famotidine (PEPCID) 20 MG tablet TAKE 1 TABLET BY MOUTH AT  BEDTIME FOR BURPING   hydrochlorothiazide (HYDRODIURIL) 25 MG tablet Take 1 tablet (25 mg total) by mouth daily.   lisinopril (ZESTRIL) 40 MG tablet TAKE 1 TABLET BY MOUTH  DAILY   meloxicam (MOBIC) 7.5 MG tablet TAKE 1 TABLET BY MOUTH  DAILY   metoprolol succinate (TOPROL-XL) 50 MG 24 hr tablet TAKE 1 AND 1/2 TABLETS BY  MOUTH DAILY TAKE WITH OR  IMMEDIATELY FOLLOWING A  MEAL   Multiple Vitamins-Minerals (MULTIVITAMIN WITH MINERALS) tablet Take 1 tablet by mouth daily.   ondansetron (ZOFRAN) 4 MG tablet TAKE 1 TABLET BY MOUTH  TWICE DAILY AS NEEDED   pantoprazole (PROTONIX) 40 MG tablet TAKE 1 TABLET BY MOUTH  DAILY   polyethylene glycol (MIRALAX / GLYCOLAX) 17 g packet Take 17 g by mouth daily.   No facility-administered encounter medications on file as of 09/14/2020.   Have you had any problems recently with your health? Patient stated she has not had any recent problems with her health. Patient had DEXA scan yesterday, 09/14/20  Have you had any problems with your pharmacy? Patient stated she has not had any problems with  [facility]  What issues or side effects are you having with your medications? Patient stated she has not had any side effects from medications   What would you like me to pass along to [facility] for them to help you with?  Patient did not have anything to pass along  What can we do to take care of you better? Patient did not offer any suggestions  Star Rating Drugs: Lisinopril 40 mg- 90 DS last filled 07/07/20  [facility] CPA, CMA

## 2020-10-05 ENCOUNTER — Other Ambulatory Visit: Payer: Self-pay | Admitting: Family

## 2020-10-15 ENCOUNTER — Other Ambulatory Visit: Payer: Self-pay | Admitting: Gastroenterology

## 2021-01-07 ENCOUNTER — Other Ambulatory Visit: Payer: Self-pay

## 2021-01-07 MED ORDER — ONDANSETRON HCL 4 MG PO TABS: 4.0000 mg | ORAL_TABLET | Freq: Two times a day (BID) | ORAL | 0 refills | Status: DC | PRN

## 2021-01-10 ENCOUNTER — Ambulatory Visit (INDEPENDENT_AMBULATORY_CARE_PROVIDER_SITE_OTHER): Payer: Medicare Other | Admitting: Pharmacist

## 2021-01-10 DIAGNOSIS — R110 Nausea: Secondary | ICD-10-CM

## 2021-01-10 DIAGNOSIS — I10 Essential (primary) hypertension: Secondary | ICD-10-CM

## 2021-01-10 DIAGNOSIS — E119 Type 2 diabetes mellitus without complications: Secondary | ICD-10-CM

## 2021-01-10 NOTE — Chronic Care Management (AMB) (Signed)
Chronic Care Management Pharmacy Note  01/10/2021 Name:  Cassidy Harrell MRN:  [id number] DOB:  [birth year]  Subjective: Cassidy Harrell is an 70 y.o. year old female who is a primary patient of [name], NP.  The CCM team was consulted for assistance with disease management and care coordination needs.    Engaged with patient by telephone for follow up visit in response to provider referral for pharmacy case management and/or care coordination services.   Consent to Services:  The patient was given information about Chronic Care Management services, agreed to services, and gave verbal consent prior to initiation of services.  Please see initial visit note for detailed documentation.   Patient Care Team: [name], NP as PCP - General (Internal Medicine) Karie Chimera, MD as Consulting Physician (Neurosurgery)  Recent office visits: 05/04/2020 - PCP Edwena Blow) - f/u chronic conditions; Stopped iron supplement due to constipation; Start MVI daily; Rx for Shingrix vaccine.   Recent consult visits: 04/19/2020 - GI ( Dr Ardis Hughs) - f/u belching, early satiety, anorexia; restart pantoprazole 40 mg pills shortly before breakfast every morning.  I gave her a new prescription for that.  She is going to continue taking her famotidine 20 mg 1 pill at bedtime every night.  I refilled her Zofran prescription 4 mg pills 1 pill twice daily as needed for nausea.   Hospital visits: None in previous 6 months  Objective:  Lab Results  Component Value Date   CREATININE 0.96 05/04/2020   CREATININE 0.89 11/05/2019   CREATININE 0.91 09/19/2019    Lab Results  Component Value Date   HGBA1C 6.9 (H) 05/04/2020   Last diabetic Eye exam:  Lab Results  Component Value Date/Time   HMDIABEYEEXA No Retinopathy 01/13/2020 12:00 AM    Last diabetic Foot exam: No results found for: HMDIABFOOTEX      Component Value Date/Time   CHOL 82 10/03/2016 1319   TRIG 94.0 10/03/2016  1319   HDL 50.00 10/03/2016 1319   CHOLHDL 2 10/03/2016 1319   VLDL 18.8 10/03/2016 1319   LDLCALC 13 10/03/2016 1319    Hepatic Function Latest Ref Rng & Units 05/04/2020 11/05/2019 04/02/2019  Total Protein 6.0 - 8.3 g/dL 7.1 7.0 7.4  Albumin 3.5 - 5.2 g/dL 4.2 - 3.3(L)  AST 0 - 37 U/L '19 14 15  ' ALT 0 - 35 U/L '20 18 15  ' Alk Phosphatase 39 - 117 U/L 59 - 77  Total Bilirubin 0.2 - 1.2 mg/dL 0.3 0.3 0.6  Bilirubin, Direct 0.0 - 0.3 mg/dL - - -    Lab Results  Component Value Date/Time   TSH 3.74 11/05/2018 02:36 PM   TSH 4.26 04/04/2017 10:04 AM   FREET4 1.14 11/15/2007 09:02 PM    CBC Latest Ref Rng & Units 05/04/2020 11/05/2019 06/25/2019  WBC 4.0 - 10.5 K/uL 5.0 5.9 5.2  Hemoglobin 12.0 - 15.0 g/dL 12.2 12.2 13.2  Hematocrit 36.0 - 46.0 % 36.8 36.8 42.2  Platelets 150.0 - 400.0 K/uL 189.0 204 220    No results found for: VD25OH  Clinical ASCVD: No  The ASCVD Risk score (Arnett DK, et al., 2019) failed to calculate for the following reasons:   Cannot find a previous HDL lab   Cannot find a previous total cholesterol lab    Other:  Dexa 09/14/2020: AP Spine L1-L4 (L3) 09/14/2020 69.5 Normal 2.6 1.487 g/cm2 AP Spine L1-L4 (L3) 06/01/2015 64.2 Normal 3.4 1.574 g/cm2   DualFemur Neck Right 09/14/2020 69.5 Normal -0.7  0.943 g/cm2 DualFemur Neck Right 06/01/2015 64.2 Normal -0.8 0.923 g/cm2   DualFemur Total Mean 09/14/2020 69.5 Normal -0.1 0.996 g/cm2 DualFemur Total Mean 06/01/2015 64.2 Normal -0.3 0.970 g/cm2   Left Forearm Radius 33% 09/14/2020 69.5 Normal 0.3 0.905 g/cm2 Left Forearm Radius 33% 06/01/2015 64.2 Normal 0.2 0.890 g/cm2  Social History   Tobacco Use  Smoking Status Former   Packs/day: 1.00   Years: 40.00   Pack years: 40.00   Types: Cigarettes   Quit date: 2011   Years since quitting: 11.8  Smokeless Tobacco Never   BP Readings from Last 3 Encounters:  05/04/20 130/65  04/19/20 130/60  01/27/20 130/60   Pulse Readings from Last 3 Encounters:   05/04/20 77  04/19/20 76  01/27/20 75   Wt Readings from Last 3 Encounters:  08/18/20 186 lb (84.4 kg)  05/04/20 186 lb (84.4 kg)  04/19/20 186 lb 6 oz (84.5 kg)    Assessment: Review of patient past medical history, allergies, medications, health status, including review of consultants reports, laboratory and other test data, was performed as part of comprehensive evaluation and provision of chronic care management services.   SDOH:  (Social Determinants of Health) assessments and interventions performed:  SDOH Interventions    Flowsheet Row Most Recent Value  SDOH Interventions   Financial Strain Interventions Intervention Not Indicated  Physical Activity Interventions Other (Comments)  [Patient doesn't like to exericse.Encouraged her to start with 5 to 10 minutes per day of any activity she enjoys and increase as able to goal of at least 150 minutes per week.]       [place]  Allergies  Allergen Reactions   Aldactone [Spironolactone] Other (See Comments)    itching    Medications Reviewed Today     Reviewed by [name], RPH-CPP (Pharmacist) on 01/10/21 at 1109  Med List Status: <None>   Medication Order Taking? Sig Documenting Provider Last Dose Status Informant  acetaminophen (TYLENOL) 650 MG CR tablet [id number] Yes Take 650 mg by mouth in the morning and at bedtime. [provider] Taking Active Self  aspirin EC 81 MG tablet [id number] Yes Take 81 mg by mouth daily with lunch. [provider] Taking Active Self  blood glucose meter kit and supplies KIT [id number] Yes Dispense based on patient and insurance preference. Use up to four times daily as directed. (FOR ICD-9 250.00, 250.01). [name], NP Taking Active Self  calcium carbonate (OSCAL) 1500 (600 Ca) MG TABS tablet [id number] Yes Take 1,200 mg by mouth daily with lunch. 800 units vitamin D [provider] Taking Active Self  famotidine (PEPCID) 20 MG tablet [id number]  Yes TAKE 1 TABLET BY MOUTH AT  BEDTIME FOR BURPING Milus Banister, MD Taking Active   hydrochlorothiazide (HYDRODIURIL) 25 MG tablet [id number] Yes TAKE 1 TABLET BY MOUTH  DAILY [name], NP Taking Active   lisinopril (ZESTRIL) 40 MG tablet [id number] Yes TAKE 1 TABLET BY MOUTH  DAILY [name], NP Taking Active   meloxicam (MOBIC) 7.5 MG tablet [id number] Yes TAKE 1 TABLET BY MOUTH  DAILY [name], NP Taking Active   metoprolol succinate (TOPROL-XL) 50 MG 24 hr tablet [id number] Yes TAKE 1 AND 1/2 TABLETS BY  MOUTH DAILY TAKE WITH OR  IMMEDIATELY FOLLOWING A  MEAL [name], NP Taking Active   Multiple Vitamins-Minerals (MULTIVITAMIN WITH MINERALS) tablet [id number] Yes Take 1 tablet by mouth daily. [name], NP Taking Active   ondansetron [facility]) 4 MG tablet [id number] Yes Take  1 tablet (4 mg total) by mouth 2 (two) times daily as needed. Milus Banister, MD Taking Active   pantoprazole (PROTONIX) 40 MG tablet [id number] Yes TAKE 1 TABLET BY MOUTH  DAILY [name], NP Taking Active   polyethylene glycol (MIRALAX / GLYCOLAX) 17 g packet [id number] Yes Take 17 g by mouth daily as needed. [provider] Taking Active             Patient Active Problem List   Diagnosis Date Noted   Fatty liver    Acute perforated appendicitis 04/02/2019   Chest pain 06/30/2015   Heart palpitations 06/30/2015   Hypokalemia 06/07/2015   Cellulitis and abscess of trunk 11/05/2014   Myalgia and myositis 05/28/2014   Allergic rhinitis 05/28/2014   Thyromegaly 11/24/2013   Foot pain 11/24/2013   Anemia, iron deficiency 08/25/2013   Diabetes type 2, controlled ([place]) 08/25/2013   Other malaise and fatigue 10/14/2012   Cervical disc disease 06/06/2012   Routine general medical examination at a health care facility 11/13/2011   Hypothyroid 10/24/2011   Arthritis 09/25/2011   Depression 09/25/2011   History of syphilis 09/25/2011   HTN  (hypertension) 09/25/2011    Immunization History  Administered Date(s) Administered   Fluad Quad(high Dose 65+) 11/05/2018   Influenza Split 11/13/2011   Influenza, High Dose Seasonal PF 03/10/2016, 01/02/2017, 02/08/2018   Influenza,inj,Quad PF,6+ Mos 11/24/2013   Influenza-Unspecified 11/27/2012, 12/03/2019, 11/30/2020   PFIZER(Purple Top)SARS-COV-2 Vaccination 04/12/2019, 05/06/2019, 12/03/2019   Pfizer Covid-19 Vaccine Bivalent Booster 32yr & up 11/30/2020   Pneumococcal Conjugate-13 04/04/2017   Pneumococcal Polysaccharide-23 11/24/2013, 10/31/2019   Tdap 04/01/2013   Zoster, Live 05/28/2015    Conditions to be addressed/monitored: HTN, DMII and chronic nausea / GERD; pain; anemia;   Care Plan : General Pharmacy (Adult)  Updates made by ECherre Robins RPH-CPP since 01/10/2021 12:00 AM     Problem: Chronic Disease Management support, education, and care coordination needs related to Diabetes, Hypertension, Pain   Priority: Medium  Onset Date: 07/20/2020  Note:   Current Barriers:  Not meeting physical activity goals Chronic Disease Management support, education, and care coordination needs related to Diabetes, Hypertension, Pain  Pharmacist Clinical Goal(s):  Over the next 180 days, patient will achieve adherence to monitoring guidelines and medication adherence to achieve therapeutic efficacy maintain control of type 2 DM, HTN and pain as evidenced by attainment of goals listed below  Increase physical activity and weight management  through collaboration with PharmD and provider.   Interventions: 1:1 collaboration with ODebbrah Alar NP regarding development and update of comprehensive plan of care as evidenced by provider attestation and co-signature Inter-disciplinary care team collaboration (see longitudinal plan of care) Comprehensive medication review performed; medication list updated in electronic medical record  Hypertension Controlled per last 6  months of office BP readings; BP goal <140/90 Recent home BP reading - checking 3 to 4 times per month 144/69; 136/74; 127/77; 133/69 Current regimen:  Lisinopril 462mdaily Metoprolol succinate 2513maily hydrochlorothiazide 14m69mily Interventions: Reviewed blood pressure goal Requested patient to check blood pressure once per week and record Ensure daily salt intake < 2300 mg/day  Diabetes A1c has remained stable and at goal of <7% over the last 6 months No statin due to low LDL - last LDL was 13  on 05/03/2016 Recent home BG readings = 111, 119, 133, 129, 118 Current regimen:  Diet and exercise management   Interventions: Reviewed diabetic diet  Limit sugar, bread, rice, pasta and potato  intake Increase non starch vegetable and fruits Continue to choose lean proteins like fish and chicken and avoid fried foods.  Reviewed home blood glucose readings and reviewed goals  Fasting blood glucose goal (before meals) = 80 to 130 Blood glucose goal after a meal = less than 180  Requested patient to check blood sugar 3 times per week and record Discussed increasing exercise / physical activity. Recommended she start with 5 to 10 minutes per day of any activity you enjoy and increase as able to goal of at least 150 minutes per week.  Chronic Nausea Managed by GI - Dr Ardis Hughs Patient reports that reflux had worsened in early 2022 when she had stopped pantoprazole but restarted at Dr Ardis Hughs request in Feb 2022. Patient states she only has to use famotidine a few times per month and has not taken ondansetron in several weeks Current regimen:  Pantoprazole 72m daily noon Famotidine 259mdaily bedtime if needed Ondansetron as needed for nausea Interventions Continue to follow up with GI  Continue current regiment or reflux Consider checking B12 and Mg with next labs since on chronic PPI therapy   Pain Patient states that pain is currently well controlled Locations of pain is in back  and joints Current regimen:  Meloxicam 7.10m68maily Acetaminophen 650m64mice a day Interventions: (addressed at previous visit)  Maintain pain medication regimen  Medication management Current pharmacy: [facility] Interventions Comprehensive medication review performed. Continue current medication management strategy Reviewed refill history and assessed adherence; no issues found  Health Maintenance:  Discussed Shingrix vaccine -patient has not gotten due to cost. Recommend reassess cost in 2023 Discussed 2nd Covid booster and annual flu vaccine- [facility] and verified both were given 11/30/2020. Vaccine records updated.  Patient is due to see PCP. Appointment made with PCP for 02/08/2021  Patient Goals/Self-Care Activities Over the next 180 days, patient will:  take medications as prescribed, check glucose 3 times per week, document, and provide at future appointments, check blood pressure weekly, document, and provide at future appointments, and target a minimum of 150 minutes of moderate intensity exercise weekly  Follow Up Plan: Telephone follow up appointment with care management team member scheduled for:  6 months        Medication Assistance: None required.  Patient affirms current coverage meets needs.  [id number] [place] [address]: [phone]: [phone] ([facility] [place] [facility] [place]: [phone]: [phone]/pharmacy #[id number] [address] [place]: [phone] Up:  Patient agrees  to Care Plan and Follow-up.  Plan: Telephone follow up appointment with care management team member scheduled for:  6 months  TammyCherre RobinsrmD Clinical Pharmacist [phone]

## 2021-01-10 NOTE — Patient Instructions (Signed)
Visit Information  Cassidy Harrell,  It was a pleasure speaking with you today. Please feel free to contact me if you have any questions or concerns. Below is information regarding you health goals.   Keep up the good work and try to increase activity / use your treadmill!  Cassidy Harrell, PharmD Clinical Pharmacist [facility] [address] [facility] [phone]  PATIENT GOALS:  Goals Addressed             This Visit's Progress    Chronic Care Management Pharmacy Care Plan   On track    CARE PLAN ENTRY (see longitudinal plan of care for additional care plan information)  Current Barriers:  Chronic Disease Management support, education, and care coordination needs related to Diabetes, Hypertension, Pain Not meeting physical activity goals    Hypertension BP Readings from Last 3 Encounters:  05/04/20 130/65  04/19/20 130/60  01/27/20 130/60  Pharmacist Clinical Goal(s): Over the next 180 days, patient will work with PharmD and providers to maintain BP goal <140/90 Current regimen:  Lisinopril 40mg  daily Metoprolol succinate 25mg  daily hydrochlorothiazide 25mg  daily Interventions: Requested patient to check blood pressure once per week and record Reviewed blood pressure goal Patient self care activities - Over the next 90 days, patient will: Check blood pressure weekly, document, and provide at future appointments Ensure daily salt intake < 2300 mg/day  Diabetes Lab Results  Component Value Date/Time   HGBA1C 6.9 (H) 05/04/2020 11:49 AM   HGBA1C 6.6 (H) 11/05/2019 01:55 PM  Pharmacist Clinical Goal(s): Over the next 180 days, patient will work with PharmD and providers to maintain A1c goal <7% Current regimen:  Diet and exercise management   Interventions: Reviewed diabetic diet  Limit sugar, bread, rice, pasta and potato intake Increase non starch vegetable and fruits Requested patient to check blood sugar 3 times per week and record Discussed  increasing exercise Patient self care activities - Over the next 90 days, patient will: Check blood sugar three times per week document, and provide at future appointments Increase exercise / physical activity. Start with 5 to 10 minutes per day of any activity you enjoy and increase as able to goal of at least 150 minutes per week. Reviewed home blood glucose readings and reviewed goals  Fasting blood glucose goal (before meals) = 80 to 130 Blood glucose goal after a meal = less than 180   Chronic Nausea Pharmacist Clinical Goal(s) Over the next 180 days, patient will work with PharmD and providers to reduce nausea symptoms Current regimen:  Pantoprazole 40mg  daily noon Famotidine 20mg  daily bedtime if needed Ondansetron as needed for nausea Patient self care activities - Over the next 180 days, patient will: Continue to follow up with gastroenterologist  Pain Pharmacist Clinical Goal(s) Over the next 90 days, patient will work with PharmD and providers to reduce patient's symptoms of pain Current regimen:  Meloxicam 7.5mg  daily Acetaminophen 650mg  twice a day Patient self care activities - Over the next 90 days, patient will: Maintain pain medication regimen  Health Maintenance:  Discussed Shingrix vaccine -patient has not gotten due to cost. Recommend reassess cost in 2023 Discussed 2nd Covid booster and annual flu vaccine- patient reports she received from [facility] this Fall. Contact [facility] and verified both were given 11/30/2020. Vaccine records updated.  Patient is due to see PCP. Appointment made with PCP for 02/08/2021  Medication management Pharmacist Clinical Goal(s): Over the next 90 days, patient will work with PharmD and providers to maintain optimal medication adherence  Current pharmacy: [facility] Interventions Comprehensive medication review performed. Continue current medication management strategy Patient self care activities - Over the next 90  days, patient will: Focus on medication adherence by filling medications appropriately  Take medications as prescribed Report any questions or concerns to PharmD and/or provider(s)  Please see past updates related to this goal by clicking on the "Past Updates" button in the selected goal          Patient verbalizes understanding of instructions provided today and agrees to view in [place].   Telephone follow up appointment with care management team member scheduled for: 6 months.

## 2021-01-26 DIAGNOSIS — I10 Essential (primary) hypertension: Secondary | ICD-10-CM | POA: Diagnosis not present

## 2021-01-26 DIAGNOSIS — E119 Type 2 diabetes mellitus without complications: Secondary | ICD-10-CM

## 2021-01-30 ENCOUNTER — Other Ambulatory Visit: Payer: Self-pay | Admitting: Family

## 2021-02-08 ENCOUNTER — Encounter: Payer: Self-pay | Admitting: Family

## 2021-02-08 ENCOUNTER — Ambulatory Visit (INDEPENDENT_AMBULATORY_CARE_PROVIDER_SITE_OTHER): Payer: Medicare Other | Admitting: Family

## 2021-02-08 VITALS — BP 137/54 | HR 87 | Temp 98.1°F | Resp 16 | Wt 189.0 lb

## 2021-02-08 DIAGNOSIS — R059 Cough, unspecified: Secondary | ICD-10-CM | POA: Insufficient documentation

## 2021-02-08 DIAGNOSIS — I10 Essential (primary) hypertension: Secondary | ICD-10-CM | POA: Diagnosis not present

## 2021-02-08 DIAGNOSIS — E010 Iodine-deficiency related diffuse (endemic) goiter: Secondary | ICD-10-CM | POA: Diagnosis not present

## 2021-02-08 DIAGNOSIS — E049 Nontoxic goiter, unspecified: Secondary | ICD-10-CM | POA: Diagnosis not present

## 2021-02-08 DIAGNOSIS — K219 Gastro-esophageal reflux disease without esophagitis: Secondary | ICD-10-CM

## 2021-02-08 LAB — TSH: TSH: 3.08 u[IU]/mL (ref 0.35–5.50)

## 2021-02-08 LAB — BASIC METABOLIC PANEL
BUN: 14 mg/dL (ref 6–23)
CO2: 28 mEq/L (ref 19–32)
Calcium: 9.7 mg/dL (ref 8.4–10.5)
Chloride: 101 mEq/L (ref 96–112)
Creatinine, Ser: 1 mg/dL (ref 0.40–1.20)
GFR: 57.3 mL/min — ABNORMAL LOW (ref >60.00)
Glucose, Bld: 148 mg/dL — ABNORMAL HIGH (ref 70–99)
Potassium: 4.1 mEq/L (ref 3.5–5.1)
Sodium: 139 mEq/L (ref 135–145)

## 2021-02-08 LAB — T4, FREE: Free T4: 0.77 ng/dL (ref 0.60–1.60)

## 2021-02-08 LAB — T3, FREE: T3, Free: 3.5 pg/mL (ref 2.3–4.2)

## 2021-02-08 MED ORDER — HYDRALAZINE HCL 25 MG PO TABS: 25.0000 mg | ORAL_TABLET | Freq: Three times a day (TID) | ORAL | 3 refills | Status: DC

## 2021-02-08 NOTE — Assessment & Plan Note (Signed)
She is already on protonix and pepcid.  Denies any over gerd symptoms.

## 2021-02-08 NOTE — Assessment & Plan Note (Addendum)
I suspect that this may be ace inhibitor cough.  See HTN above.

## 2021-02-08 NOTE — Patient Instructions (Addendum)
Please visit the lab before you leave today. Please visit downstairs to schedule an ultrasound of your thyroid. Stop taking 40 mg Lisinopril. Begin 25 mg hydralazine 3 times daily.  Schedule follow up appointment in about a week.

## 2021-02-08 NOTE — Assessment & Plan Note (Signed)
D/C lisinopril. Continue metoprolol 75mg , hctz 25mg  and add hydralazine 25mg  TID.  Unfortunately, she did not tolerate amlodipine due to LE edema. If BP is elevated on this regimen, could consider changing metoprolol to coreg.

## 2021-02-08 NOTE — Assessment & Plan Note (Signed)
I wonder if her globus sensation is related to her thyroid enlargement. She had [place] back in 2019. Will recheck [place] along with TFT's.

## 2021-02-08 NOTE — Progress Notes (Signed)
Subjective:   By signing my name below, I, [name], attest that this documentation has been prepared under the direction and in the presence of [name], NP, 02/08/2021     Patient ID: Cassidy Harrell, female    DOB: 1951-02-13, 70 y.o.   MRN: [id number]  Chief Complaint  Patient presents with   Sore Throat    Complains of throat feeling itchy, some pain and feels swollenf for about 3 months. Tested negative for covid today.     HPI Patient is in today for an office visit.  Throat irritation: She complains that she has persistent throat irritation that she describes as an "itch" in her throat. She also reports a constant cough. She notes that she has a small amount of drainage in the back of her throat and describes that sometimes it can be clear or white but intermittently will be yellow or green. She is compliant with taking 40 mg Protonix and 20 mg Pepcid. Also reports that it feels "like something is stuck" in the back of her throat.   Blood pressure: She is currently taking 40 mg lisinopril which could be a cause of her throat itchiness. She has previously tried amlodipine and it caused too much swelling. Her blood pressure is well controlled with the use of 40 mg lisinopril.   Diet change: She reports a change in her diet at this time. She notes that she is no longer desiring to eat meat or fish because she feels as though it stays in her stomach and does not digest easily. She does not see this as a negative change but feels neutral towards it. She has follow up scheduled with her GI doctor.   Health Maintenance Due  Topic Date Due   Zoster Vaccines- Shingrix (1 of 2) Never done   FOOT EXAM  11/05/2019   HEMOGLOBIN A1C  11/04/2020   OPHTHALMOLOGY EXAM  01/12/2021    Past Medical History:  Diagnosis Date   Acute perforated appendicitis 04/02/2019   Anemia    Arthritis    Depression    Diabetes mellitus without complication (HCC)    Dysrhythmia     Over 10 years ago   Fatty liver    Gum disease    Heart murmur    History of syphilis    Hypertension    Irregular heart beat    Personal history of colonic polyps    Phlebitis    Pre-diabetes     Past Surgical History:  Procedure Laterality Date   CYSTECTOMY     between breasts   IR RADIOLOGIST EVAL & MGMT  04/15/2019   IR RADIOLOGIST EVAL & MGMT  04/29/2019   IR RADIOLOGIST EVAL & MGMT  05/08/2019   LAPAROSCOPIC APPENDECTOMY N/A 06/30/2019   Procedure: APPENDECTOMY LAPAROSCOPIC;  Surgeon: Ralene Ok, MD;  Location: [facility];  Service: General;  Laterality: N/A;   SHOULDER ARTHROSCOPY Right 12/2015   SPINE SURGERY  08/20/2015   bulging / herniated disc in C-spine   THERAPEUTIC ABORTION  1970. 1974, 1978    Family History  Problem Relation Age of Onset   Diabetes Mother    Cancer Father        lung cancer   Kidney disease Father    Seizures Father    Colon cancer Brother    Heart attack Brother    Stroke Brother    Stomach cancer Neg Hx    Esophageal cancer Neg Hx    Rectal cancer Neg Hx  Social History   Socioeconomic History   Marital status: Divorced    Spouse name: Not on file   Number of children: 1   Years of education: Not on file   Highest education level: Not on file  Occupational History   Not on file  Tobacco Use   Smoking status: Former    Packs/day: 1.00    Years: 40.00    Pack years: 40.00    Types: Cigarettes    Quit date: 2011    Years since quitting: 11.9   Smokeless tobacco: Never  Vaping Use   Vaping Use: Never used  Substance and Sexual Activity   Alcohol use: No   Drug use: No   Sexual activity: Not on file  Other Topics Concern   Not on file  Social History Narrative   Regular exercise:  No   Caffeine Use:  No   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    Divorced   Son Age 84- alive and well   2 grand daughters- age 56 and an adopted Curator age 70   [place] reading,  walking spending time with family, 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[employer]            Social Determinants of Radio broadcast assistant Strain: Low Risk    Difficulty of Paying Living Expenses: Not very hard  Food Insecurity: Not on file  Transportation Needs: Not on file  Physical Activity: Inactive   Days of Exercise per Week: 0 days   Minutes of Exercise per Session: 0 min  Stress: No Stress Concern Present   Feeling of Stress : Only a little  Social Connections: Moderately Integrated   Frequency of Communication with Friends and Family: More than three times a week   Frequency of Social Gatherings with Friends and Family: More than three times a week   Attends Religious Services: More than 4 times per year   Active Member of [employer] or Organizations: Yes   Attends Music therapist: More than 4 times per year   Marital Status: Divorced  Human resources officer Violence: Not At Risk   Fear of Current or Ex-Partner: No   Emotionally Abused: No   Physically Abused: No   Sexually Abused: No    Outpatient Medications Prior to Visit  Medication Sig Dispense Refill   metoprolol succinate (TOPROL-XL) 50 MG 24 hr tablet TAKE 1 AND 1/2 TABLETS BY  MOUTH DAILY TAKE WITH OR  IMMEDIATELY FOLLOWING A  MEAL 135 tablet 1   acetaminophen (TYLENOL) 650 MG CR tablet Take 650 mg by mouth in the morning and at bedtime.     aspirin EC 81 MG tablet Take 81 mg by mouth daily with lunch.     blood glucose meter kit and supplies KIT Dispense based on patient and insurance preference. Use up to four times daily as directed. (FOR ICD-9 250.00, 250.01). 1 each 0   calcium carbonate (OSCAL) 1500 (600 Ca) MG TABS tablet Take 1,200 mg by mouth daily with lunch. 800 units vitamin D     famotidine (PEPCID) 20 MG tablet TAKE 1 TABLET BY MOUTH AT  BEDTIME FOR BURPING 90 tablet 0   hydrochlorothiazide (HYDRODIURIL) 25 MG tablet TAKE 1 TABLET BY MOUTH  DAILY 90 tablet 1   meloxicam (MOBIC)  7.5 MG tablet TAKE 1 TABLET BY MOUTH  DAILY 90 tablet 3   Multiple Vitamins-Minerals (MULTIVITAMIN WITH MINERALS) tablet Take 1 tablet by  mouth daily.     ondansetron (ZOFRAN) 4 MG tablet Take 1 tablet (4 mg total) by mouth 2 (two) times daily as needed. 180 tablet 0   pantoprazole (PROTONIX) 40 MG tablet TAKE 1 TABLET BY MOUTH  DAILY 90 tablet 1   polyethylene glycol (MIRALAX / GLYCOLAX) 17 g packet Take 17 g by mouth daily as needed.     lisinopril (ZESTRIL) 40 MG tablet TAKE 1 TABLET BY MOUTH  DAILY 90 tablet 3   No facility-administered medications prior to visit.    Allergies  Allergen Reactions   Aldactone [Spironolactone] Other (See Comments)    itching    Review of Systems  HENT:  Positive for sore throat ("itchy").   Respiratory:  Positive for cough and sputum production (mild).       Objective:    Physical Exam Constitutional:      General: She is not in acute distress.    Appearance: Normal appearance. She is not ill-appearing.  HENT:     Head: Normocephalic and atraumatic.     Right Ear: Tympanic membrane, ear canal and external ear normal.     Left Ear: Tympanic membrane, ear canal and external ear normal.  Eyes:     Extraocular Movements: Extraocular movements intact.     Pupils: Pupils are equal, round, and reactive to light.  Neck:     Thyroid: Thyromegaly (right side thyromegaly) present.  Cardiovascular:     Rate and Rhythm: Normal rate and regular rhythm.     Heart sounds: Normal heart sounds. No murmur heard.   No gallop.  Pulmonary:     Effort: Pulmonary effort is normal. No respiratory distress.     Breath sounds: Normal breath sounds. No wheezing or rales.  Lymphadenopathy:     Cervical: No cervical adenopathy.  Skin:    General: Skin is warm and dry.  Neurological:     Mental Status: She is alert and oriented to person, place, and time.  Psychiatric:        Behavior: Behavior normal.        Judgment: Judgment normal.    BP (!) 137/54 (BP  Location: Right Arm, Patient Position: Sitting, Cuff Size: Large)    Pulse 87    Temp 98.1 F (36.7 C) (Oral)    Resp 16    Wt 189 lb (85.7 kg)    SpO2 100%    BMI 33.48 kg/m  Wt Readings from Last 3 Encounters:  02/08/21 189 lb (85.7 kg)  08/18/20 186 lb (84.4 kg)  05/04/20 186 lb (84.4 kg)       Assessment & Plan:   Problem List Items Addressed This Visit       Unprioritized   Thyromegaly    I wonder if her globus sensation is related to her thyroid enlargement. She had [place] back in 2019. Will recheck [place] along with TFT's.       HTN (hypertension)    D/C lisinopril. Continue metoprolol 26m, hctz 257mand add hydralazine 2586mID.  Unfortunately, she did not tolerate amlodipine due to LE edema. If BP is elevated on this regimen, could consider changing metoprolol to coreg.       Relevant Medications   hydrALAZINE (APRESOLINE) 25 MG tablet   Other Relevant Orders   Basic metabolic panel   GERD (gastroesophageal reflux disease)    She is already on protonix and pepcid.  Denies any over gerd symptoms.       Cough    I suspect  that this may be ace inhibitor cough.  See HTN below.       Other Visit Diagnoses     thryomegally    -  Primary   Relevant Orders   US THYROID   TSH   T3, free   T4, free      Meds ordered this encounter  Medications   hydrALAZINE (APRESOLINE) 25 MG tablet    Sig: Take 1 tablet (25 mg total) by mouth 3 (three) times daily.    Dispense:  90 tablet    Refill:  3    Order Specific Question:   Supervising Provider    Answer:   Penni Homans A [[id number]]    I, [name], NP, personally preformed the services described in this documentation.  All medical record entries made by the scribe were at my direction and in my presence.  I have reviewed the chart and discharge instructions (if applicable) and agree that the record reflects my personal performance and is accurate and complete. 02/08/2021  I,[name],acting as a Education administrator for  [name], NP.,have documented all relevant documentation on the behalf of [name], NP,as directed by  [name], NP while in the presence of [name], NP.  [name], NP

## 2021-02-10 ENCOUNTER — Other Ambulatory Visit: Payer: Self-pay | Admitting: Family

## 2021-02-15 ENCOUNTER — Other Ambulatory Visit: Payer: Self-pay

## 2021-02-15 ENCOUNTER — Ambulatory Visit (INDEPENDENT_AMBULATORY_CARE_PROVIDER_SITE_OTHER): Payer: Medicare Other | Admitting: Family

## 2021-02-15 ENCOUNTER — Ambulatory Visit (HOSPITAL_BASED_OUTPATIENT_CLINIC_OR_DEPARTMENT_OTHER)
Admission: RE | Admit: 2021-02-15 | Discharge: 2021-02-15 | Disposition: A | Discharge status: Home / Self Care | Payer: Medicare Other | Source: Ambulatory Visit | Attending: Family | Admitting: Family

## 2021-02-15 DIAGNOSIS — M1990 Unspecified osteoarthritis, unspecified site: Secondary | ICD-10-CM | POA: Diagnosis not present

## 2021-02-15 DIAGNOSIS — I10 Essential (primary) hypertension: Secondary | ICD-10-CM | POA: Diagnosis not present

## 2021-02-15 DIAGNOSIS — E049 Nontoxic goiter, unspecified: Secondary | ICD-10-CM | POA: Insufficient documentation

## 2021-02-15 DIAGNOSIS — E010 Iodine-deficiency related diffuse (endemic) goiter: Secondary | ICD-10-CM | POA: Diagnosis not present

## 2021-02-15 DIAGNOSIS — E041 Nontoxic single thyroid nodule: Secondary | ICD-10-CM | POA: Diagnosis not present

## 2021-02-15 MED ORDER — MELOXICAM 7.5 MG PO TABS: 7.5000 mg | ORAL_TABLET | Freq: Every day | ORAL | 1 refills | Status: DC

## 2021-02-15 MED ORDER — HYDROCHLOROTHIAZIDE 25 MG PO TABS: 25.0000 mg | ORAL_TABLET | Freq: Every day | ORAL | 1 refills | Status: DC

## 2021-02-15 NOTE — Assessment & Plan Note (Addendum)
Improved.  Continue current regimen. Throat itching is resolved off of lisinopril.  I placed lisinopril on her allergy list.

## 2021-02-15 NOTE — Assessment & Plan Note (Signed)
She is scheduled to complete her thyroid US today.

## 2021-02-15 NOTE — Assessment & Plan Note (Signed)
Stable with daily use of meloxicam and tylenol. Continue same.

## 2021-02-15 NOTE — Progress Notes (Signed)
Subjective:   By signing my name below, I, Cassidy Harrell, attest that this documentation has been prepared under the direction and in the presence of Cassidy Alar, NP, 02/15/2021     Patient ID: Cassidy Harrell, female    DOB: 02/21/1951, 70 y.o.   MRN: [id number]  No chief complaint on file.   HPI Patient is in today for an office visit.  Blood pressure: Her blood pressure is within good range today in the office. During her last visit she was told to stop taking 40 mg lisinopril and begin taking 25 mg hydralazine. She was also told to continue taking 50 mg metoprolol succinate and 25 mg hydrochlorothiazide. This new adjustment seems to be doing well for her blood pressure. She also notes since stopping the 40 mg lisinopril the itchiness in her throat has stopped.  BP Readings from Last 3 Encounters:  02/15/21 130/61  02/08/21 (!) 137/54  05/04/20 130/65  Back pain: She is still experiencing chronic back pain but notes with the use of 7.5 mg meloxicam and 650 mg tylenol it is relieved.   Health Maintenance Due  Topic Date Due   Zoster Vaccines- Shingrix (1 of 2) Never done   FOOT EXAM  11/05/2019   HEMOGLOBIN A1C  11/04/2020   OPHTHALMOLOGY EXAM  01/12/2021    Past Medical History:  Diagnosis Date   Acute perforated appendicitis 04/02/2019   Anemia    Arthritis    Depression    Diabetes mellitus without complication (HCC)    Dysrhythmia    Over 10 years ago   Fatty liver    Gum disease    Heart murmur    History of syphilis    Hypertension    Irregular heart beat    Personal history of colonic polyps    Phlebitis    Pre-diabetes     Past Surgical History:  Procedure Laterality Date   CYSTECTOMY     between breasts   IR RADIOLOGIST EVAL & MGMT  04/15/2019   IR RADIOLOGIST EVAL & MGMT  04/29/2019   IR RADIOLOGIST EVAL & MGMT  05/08/2019   LAPAROSCOPIC APPENDECTOMY N/A 06/30/2019   Procedure: APPENDECTOMY LAPAROSCOPIC;  Surgeon: Ralene Ok, MD;   Location: [facility];  Service: General;  Laterality: N/A;   SHOULDER ARTHROSCOPY Right 12/2015   SPINE SURGERY  08/20/2015   bulging / herniated disc in C-spine   THERAPEUTIC [place], 1978    Family History  Problem Relation Age of Onset   Diabetes Mother    Cancer Father        lung cancer   Kidney disease Father    Seizures Father    Colon cancer Brother    Heart attack Brother    Stroke Brother    Stomach cancer Neg Hx    Esophageal cancer Neg Hx    Rectal cancer Neg Hx     Social History   Socioeconomic History   Marital status: Divorced    Spouse name: Not on file   Number of children: 1   Years of education: Not on file   Highest education level: Not on file  Occupational History   Not on file  Tobacco Use   Smoking status: Former    Packs/day: 1.00    Years: 40.00    Pack years: 40.00    Types: Cigarettes    Quit date: 2011    Years since quitting: 11.9   Smokeless tobacco: Never  Vaping Use  Vaping Use: Never used  Substance and Sexual Activity   Alcohol use: No   Drug use: No   Sexual activity: Not on file  Other Topics Concern   Not on file  Social History Narrative   Regular exercise:  No   Caffeine Use:  No   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    Divorced   Son Age 20- alive and well   2 grand daughters- age 60 and an adopted Curator age 93   [place] reading, walking spending time with family, 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[employer]            Social Determinants of Radio broadcast assistant Strain: Low Risk    Difficulty of Paying Living Expenses: Not very hard  Food Insecurity: Not on file  Transportation Needs: Not on file  Physical Activity: Inactive   Days of Exercise per Week: 0 days   Minutes of Exercise per Session: 0 min  Stress: No Stress Concern Present   Feeling of Stress : Only a little  Social Connections: Moderately Integrated    Frequency of Communication with Friends and Family: More than three times a week   Frequency of Social Gatherings with Friends and Family: More than three times a week   Attends Religious Services: More than 4 times per year   Active Member of [employer] or Organizations: Yes   Attends Music therapist: More than 4 times per year   Marital Status: Divorced  Human resources officer Violence: Not At Risk   Fear of Current or Ex-Partner: No   Emotionally Abused: No   Physically Abused: No   Sexually Abused: No    Outpatient Medications Prior to Visit  Medication Sig Dispense Refill   metoprolol succinate (TOPROL-XL) 50 MG 24 hr tablet TAKE 1 AND 1/2 TABLETS BY  MOUTH DAILY TAKE WITH OR  IMMEDIATELY FOLLOWING A  MEAL 135 tablet 1   acetaminophen (TYLENOL) 650 MG CR tablet Take 650 mg by mouth in the morning and at bedtime.     aspirin EC 81 MG tablet Take 81 mg by mouth daily with lunch.     blood glucose meter kit and supplies KIT Dispense based on patient and insurance preference. Use up to four times daily as directed. (FOR ICD-9 250.00, 250.01). 1 each 0   calcium carbonate (OSCAL) 1500 (600 Ca) MG TABS tablet Take 1,200 mg by mouth daily with lunch. 800 units vitamin D     famotidine (PEPCID) 20 MG tablet TAKE 1 TABLET BY MOUTH AT  BEDTIME FOR BURPING 90 tablet 0   hydrALAZINE (APRESOLINE) 25 MG tablet Take 1 tablet (25 mg total) by mouth 3 (three) times daily. 90 tablet 3   hydrochlorothiazide (HYDRODIURIL) 25 MG tablet TAKE 1 TABLET BY MOUTH  DAILY 90 tablet 1   meloxicam (MOBIC) 7.5 MG tablet TAKE 1 TABLET BY MOUTH  DAILY 90 tablet 3   Multiple Vitamins-Minerals (MULTIVITAMIN WITH MINERALS) tablet Take 1 tablet by mouth daily.     ondansetron (ZOFRAN) 4 MG tablet Take 1 tablet (4 mg total) by mouth 2 (two) times daily as needed. 180 tablet 0   pantoprazole (PROTONIX) 40 MG tablet TAKE 1 TABLET BY MOUTH  DAILY 90 tablet 1   polyethylene glycol (MIRALAX / GLYCOLAX) 17 g packet Take 17 g  by mouth daily as needed.     No facility-administered medications prior to visit.    Allergies  Allergen  Reactions   Aldactone [Spironolactone] Other (See Comments)    itching    Review of Systems  HENT:  Negative for sore throat (resolved after stopping 40 mg lisinopril).   Musculoskeletal:  Positive for back pain (releived with use of meloxicam and tylonel).      Objective:    Physical Exam Constitutional:      General: She is not in acute distress.    Appearance: Normal appearance. She is not ill-appearing.  HENT:     Head: Normocephalic and atraumatic.     Right Ear: External ear normal.     Left Ear: External ear normal.  Eyes:     Extraocular Movements: Extraocular movements intact.     Pupils: Pupils are equal, round, and reactive to light.  Skin:    General: Skin is warm and dry.  Neurological:     Mental Status: She is alert and oriented to person, place, and time.  Psychiatric:        Behavior: Behavior normal.        Judgment: Judgment normal.    There were no vitals taken for this visit. Wt Readings from Last 3 Encounters:  02/08/21 189 lb (85.7 kg)  08/18/20 186 lb (84.4 kg)  05/04/20 186 lb (84.4 kg)       Assessment & Plan:   Problem List Items Addressed This Visit   None  No orders of the defined types were placed in this encounter.   I, Cassidy Alar, NP, personally preformed the services described in this documentation.  All medical record entries made by the scribe were at my direction and in my presence.  I have reviewed the chart and discharge instructions (if applicable) and agree that the record reflects my personal performance and is accurate and complete. 02/15/2021  I,Cassidy Harrell,acting as a Education administrator for [name], NP.,have documented all relevant documentation on the behalf of [name], NP,as directed by  [name], NP while in the presence of [name], NP.  Cassidy  Harrell

## 2021-02-15 NOTE — Patient Instructions (Addendum)
Please schedule a follow up appointment in 3 months.

## 2021-03-24 ENCOUNTER — Other Ambulatory Visit: Payer: Self-pay | Admitting: Family

## 2021-04-06 ENCOUNTER — Telehealth: Payer: Self-pay | Admitting: Pharmacist

## 2021-04-06 NOTE — Chronic Care Management (AMB) (Signed)
° ° °  Chronic Care Management Pharmacy Assistant   Name: Cassidy Harrell  MRN: [id number] DOB: [birth year]  Reason for Encounter: Disease State Diabetes Mellitus    Recent office visits:  02/15/21-Cassidy Inda Castle, NP (PCP) Seen for general follow up visit. Follow up in 3 months. 02/08/21-Cassidy Inda Castle, NP (PCP) Seen for sore throat. Stop taking 40 mg lisinopril and Start on Hydralazine 23 mg TID. Labs ordered. Follow up in 1 week.   Recent consult visits:  None noted  Hospital visits:  None in previous 6 months  Medications: Outpatient Encounter Medications as of 04/06/2021  Medication Sig   metoprolol succinate (TOPROL-XL) 50 MG 24 hr tablet TAKE 1 AND 1/2 TABLETS BY  MOUTH DAILY TAKE WITH OR  IMMEDIATELY FOLLOWING A  MEAL   acetaminophen (TYLENOL) 650 MG CR tablet Take 650 mg by mouth in the morning and at bedtime.   aspirin EC 81 MG tablet Take 81 mg by mouth daily with lunch.   blood glucose meter kit and supplies KIT Dispense based on patient and insurance preference. Use up to four times daily as directed. (FOR ICD-9 250.00, 250.01).   calcium carbonate (OSCAL) 1500 (600 Ca) MG TABS tablet Take 1,200 mg by mouth daily with lunch. 800 units vitamin D   famotidine (PEPCID) 20 MG tablet TAKE 1 TABLET BY MOUTH AT  BEDTIME FOR BURPING   hydrALAZINE (APRESOLINE) 25 MG tablet Take 1 tablet (25 mg total) by mouth 3 (three) times daily.   hydrochlorothiazide (HYDRODIURIL) 25 MG tablet Take 1 tablet (25 mg total) by mouth daily.   meloxicam (MOBIC) 7.5 MG tablet Take 1 tablet (7.5 mg total) by mouth daily.   Multiple Vitamins-Minerals (MULTIVITAMIN WITH MINERALS) tablet Take 1 tablet by mouth daily.   ondansetron (ZOFRAN) 4 MG tablet Take 1 tablet (4 mg total) by mouth 2 (two) times daily as needed.   pantoprazole (PROTONIX) 40 MG tablet TAKE 1 TABLET BY MOUTH  DAILY   polyethylene glycol (MIRALAX / GLYCOLAX) 17 g packet Take 17 g by mouth daily as needed.   No  facility-administered encounter medications on file as of 04/06/2021.   Current antihyperglycemic regimen:  Diet and exercise management  What recent interventions/DTPs have been made to improve glycemic control:  None noted  Have there been any recent hospitalizations or ED visits since last visit with [facility]PP? No  Patient denies hypoglycemic symptoms, including Pale, Sweaty, Shaky, Hungry, Nervous/irritable, and Vision changes  Patient denies hyperglycemic symptoms, including blurry vision, excessive thirst, fatigue, polyuria, and weakness  How often are you checking your blood sugar?     Patient states she does not check her blood sugar.  What are your blood sugars ranging?  Fasting: N/a Before meals: N/a After meals: N/a Bedtime: N/a  During the week, how often does your blood glucose drop below 70? Never  Are you checking your feet daily/regularly?  Patient states she has a "blot clot" on her 2nd toe finger. That's showed up 1 month ago. Patient denies any pain, drainage. I recommended for patient to see a provider if her toe gets to any of those symptoms.  Patient states at night she has extremely dry mouth.   Adherence Review: Is the patient currently on a STATIN medication? No Is the patient currently on ACE/ARB medication? No Does the patient have >5 day gap between last estimated fill dates? No   Star Rating Drugs: None noted  Cassidy Harrell, [place]

## 2021-04-27 ENCOUNTER — Other Ambulatory Visit (HOSPITAL_BASED_OUTPATIENT_CLINIC_OR_DEPARTMENT_OTHER): Payer: Self-pay | Admitting: Family

## 2021-04-27 DIAGNOSIS — Z1231 Encounter for screening mammogram for malignant neoplasm of breast: Secondary | ICD-10-CM

## 2021-05-06 ENCOUNTER — Encounter: Payer: Self-pay | Admitting: Family

## 2021-05-06 MED ORDER — ONETOUCH VERIO VI STRP: ORAL_STRIP | 12 refills | Status: AC

## 2021-05-13 ENCOUNTER — Ambulatory Visit (INDEPENDENT_AMBULATORY_CARE_PROVIDER_SITE_OTHER): Payer: Medicare Other | Admitting: Family

## 2021-05-13 VITALS — BP 129/58 | HR 78 | Temp 98.0°F | Resp 16 | Ht 62.0 in | Wt 191.0 lb

## 2021-05-13 DIAGNOSIS — R002 Palpitations: Secondary | ICD-10-CM | POA: Diagnosis not present

## 2021-05-13 DIAGNOSIS — E119 Type 2 diabetes mellitus without complications: Secondary | ICD-10-CM | POA: Diagnosis not present

## 2021-05-13 DIAGNOSIS — E039 Hypothyroidism, unspecified: Secondary | ICD-10-CM | POA: Diagnosis not present

## 2021-05-13 DIAGNOSIS — I10 Essential (primary) hypertension: Secondary | ICD-10-CM

## 2021-05-13 DIAGNOSIS — K219 Gastro-esophageal reflux disease without esophagitis: Secondary | ICD-10-CM | POA: Diagnosis not present

## 2021-05-13 MED ORDER — HYDRALAZINE HCL 25 MG PO TABS: 25.0000 mg | ORAL_TABLET | Freq: Three times a day (TID) | ORAL | 1 refills | Status: DC

## 2021-05-13 NOTE — Assessment & Plan Note (Signed)
Stable. Has follow up appointment with GI.   ?

## 2021-05-13 NOTE — Assessment & Plan Note (Signed)
Lab Results  ?Component Value Date  ? TSH 3.08 02/08/2021  ? ?Stable. Monitor.  ?

## 2021-05-13 NOTE — Assessment & Plan Note (Addendum)
BP Readings from Last 3 Encounters:  ?05/13/21 (!) 129/58  ?02/15/21 130/61  ?02/08/21 (!) 137/54  ? ?Stable. Continue hctz, hydralazineand metoprolol.   ? ? ?

## 2021-05-13 NOTE — Assessment & Plan Note (Signed)
Lab Results  ?Component Value Date  ? HGBA1C 6.9 (H) 05/04/2020  ? HGBA1C 6.6 (H) 11/05/2019  ? HGBA1C 6.2 (H) 06/25/2019  ? ?Lab Results  ?Component Value Date  ? MICROALBUR 0.7 09/16/2014  ? [place] 13 10/03/2016  ? CREATININE 1.00 02/08/2021  ? ?Working on diet, needs improvement.  ?

## 2021-05-13 NOTE — Progress Notes (Signed)
? ?Subjective:  ? ?By signing my name below, I, Cassidy Harrell, attest that this documentation has been prepared under the direction and in the presence of [name] NP, 05/13/2021 ? ? Patient ID: Cassidy Harrell, female    DOB: Apr 12, 1950, 71 y.o.   MRN: [id number] ? ?Chief Complaint  ?Patient presents with  ? Diabetes  ?  Here for follow up  ? Hypertension  ?  Here for follow up  ? ? ?HPI ?Patient is in today for an office visit. ? ?Refill - She is requesting a refill of 25 MG of Hyralazine. ? ?Shortness of Breath - Patient complains of shortness of breath. Her symptoms worsen when she is getting dressed. She states that she feels palpations. ? ?Vision - She complains of worsening of vision. She is planning to schedule a vision exam in the future. ? ?Hemoglobin A1C - She does not feel like her blood sugars have been well. She is currently managing her A1C levels by dieting. She has been stressed lately which she believes could contribute to her elevated A1C levels. She has been eating [employer].  ?Lab Results  ?Component Value Date  ? HGBA1C 6.9 (H) 05/04/2020  ? ?Thyroid - Her thyroid levels appear fine. ?Lab Results  ?Component Value Date  ? TSH 3.08 02/08/2021  ? ?Blood Pressure - Her blood pressure levels are good. She continues to take 25 MG of Hydralazine, 50 MG of Metoprolol Succinate, and 25 Mg of Hydrodiuril.  ?BP Readings from Last 3 Encounters:  ?05/13/21 (!) 129/58  ?02/15/21 130/61  ?02/08/21 (!) 137/54  ? ? ?Health Maintenance Due  ?Topic Date Due  ? FOOT EXAM  11/05/2019  ? HEMOGLOBIN A1C  11/04/2020  ? OPHTHALMOLOGY EXAM  01/12/2021  ? ? ?Past Medical History:  ?Diagnosis Date  ? Acute perforated appendicitis 04/02/2019  ? Anemia   ? Arthritis   ? Depression   ? Diabetes mellitus without complication ([place])   ? Dysrhythmia   ? Over 10 years ago  ? Fatty liver   ? Gum disease   ? Heart murmur   ? History of syphilis   ? Hypertension   ? Irregular heart beat   ? Personal history of colonic  polyps   ? Phlebitis   ? Pre-diabetes   ? ? ?Past Surgical History:  ?Procedure Laterality Date  ? CYSTECTOMY    ? between breasts  ? IR RADIOLOGIST EVAL & MGMT  04/15/2019  ? IR RADIOLOGIST EVAL & MGMT  04/29/2019  ? IR RADIOLOGIST EVAL & MGMT  05/08/2019  ? LAPAROSCOPIC APPENDECTOMY N/A 06/30/2019  ? Procedure: APPENDECTOMY LAPAROSCOPIC;  Surgeon: Ralene Ok, MD;  Location: [facility];  Service: General;  Laterality: N/A;  ? SHOULDER ARTHROSCOPY Right 12/2015  ? SPINE SURGERY  08/20/2015  ? bulging / herniated disc in C-spine  ? THERAPEUTIC ABORTION  1970. 1974, 1978  ? ? ?Family History  ?Problem Relation Age of Onset  ? Diabetes Mother   ? Cancer Father   ?     lung cancer  ? Kidney disease Father   ? Seizures Father   ? Colon cancer Brother   ? Heart attack Brother   ? Stroke Brother   ? Stomach cancer Neg Hx   ? Esophageal cancer Neg Hx   ? Rectal cancer Neg Hx   ? ? ?Social History  ? ?Socioeconomic History  ? Marital status: Divorced  ?  Spouse name: Not on file  ? Number of children: 1  ?  Years of education: Not on file  ? Highest education level: Not on file  ?Occupational History  ? Not on file  ?Tobacco Use  ? Smoking status: Former  ?  Packs/day: 1.00  ?  Years: 40.00  ?  Pack years: 40.00  ?  Types: Cigarettes  ?  Quit date: 2011  ?  Years since quitting: 12.2  ? Smokeless tobacco: Never  ?Vaping Use  ? Vaping Use: Never used  ?Substance and Sexual Activity  ? Alcohol use: No  ? Drug use: No  ? Sexual activity: Not on file  ?Other Topics Concern  ? Not on file  ?Social History Narrative  ? Regular exercise:  No  ? Caffeine Use:  No  ? 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 Divorced  ? Son Age 54- alive and well  ? 2 grand daughters- age 32 and an adopted grandaughter age 38  ? 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   ?   ?   ? ?Social Determinants of Health  ? ?Financial Resource  Strain: Low Risk   ? Difficulty of Paying Living Expenses: Not very hard  ?Food Insecurity: Not on file  ?Transportation Needs: Not on file  ?Physical Activity: Inactive  ? Days of Exercise per Week: 0 days  ? Minutes of Exercise per Session: 0 min  ?Stress: No Stress Concern Present  ? Feeling of Stress : Only a little  ?Social Connections: Moderately Integrated  ? Frequency of Communication with Friends and Family: More than three times a week  ? Frequency of Social Gatherings with Friends and Family: More than three times a week  ? Attends Religious Services: More than 4 times per year  ? Active Member of Clubs or Organizations: Yes  ? Attends Archivist Meetings: More than 4 times per year  ? Marital Status: Divorced  ?Intimate Partner Violence: Not At Risk  ? Fear of Current or Ex-Partner: No  ? Emotionally Abused: No  ? Physically Abused: No  ? Sexually Abused: No  ? ? ?Outpatient Medications Prior to Visit  ?Medication Sig Dispense Refill  ? acetaminophen (TYLENOL) 650 MG CR tablet Take 650 mg by mouth in the morning and at bedtime.    ? aspirin EC 81 MG tablet Take 81 mg by mouth daily with lunch.    ? blood glucose meter kit and supplies KIT Dispense based on patient and insurance preference. Use up to four times daily as directed. (FOR ICD-9 250.00, 250.01). 1 each 0  ? calcium carbonate (OSCAL) 1500 (600 Ca) MG TABS tablet Take 1,200 mg by mouth daily with lunch. 800 units vitamin D    ? famotidine (PEPCID) 20 MG tablet TAKE 1 TABLET BY MOUTH AT  BEDTIME FOR BURPING 90 tablet 0  ? glucose blood (ONETOUCH VERIO) test strip Check blood sugars daily 100 each 12  ? hydrochlorothiazide (HYDRODIURIL) 25 MG tablet Take 1 tablet (25 mg total) by mouth daily. 90 tablet 1  ? meloxicam (MOBIC) 7.5 MG tablet Take 1 tablet (7.5 mg total) by mouth daily. 90 tablet 1  ? metoprolol succinate (TOPROL-XL) 50 MG 24 hr tablet TAKE 1 AND 1/2 TABLETS BY  MOUTH DAILY TAKE WITH OR  IMMEDIATELY FOLLOWING A  MEAL 135  tablet 1  ? Multiple Vitamins-Minerals (MULTIVITAMIN WITH MINERALS) tablet Take 1 tablet by mouth daily.    ? ondansetron (ZOFRAN)  4 MG tablet Take 1 tablet (4 mg total) by mouth 2 (two) times daily as needed. 180 tablet 0  ? pantoprazole (PROTONIX) 40 MG tablet TAKE 1 TABLET BY MOUTH  DAILY 90 tablet 3  ? polyethylene glycol (MIRALAX / GLYCOLAX) 17 g packet Take 17 g by mouth daily as needed.    ? hydrALAZINE (APRESOLINE) 25 MG tablet Take 1 tablet (25 mg total) by mouth 3 (three) times daily. 90 tablet 3  ? ?No facility-administered medications prior to visit.  ? ? ?Allergies  ?Allergen Reactions  ? Aldactone [Spironolactone] Other (See Comments)  ?  itching  ? Lisinopril   ?  Throat itching  ? ? ?Review of Systems  ?Respiratory:  Positive for shortness of breath.   ?Cardiovascular:  Positive for palpitations.  ? ?   ?Objective:  ?  ?Physical Exam ?Constitutional:   ?   General: She is not in acute distress. ?   Appearance: Normal appearance. She is not ill-appearing.  ?HENT:  ?   Head: Normocephalic and atraumatic.  ?   Right Ear: External ear normal.  ?   Left Ear: External ear normal.  ?Eyes:  ?   Extraocular Movements: Extraocular movements intact.  ?   Pupils: Pupils are equal, round, and reactive to light.  ?Cardiovascular:  ?   Rate and Rhythm: Normal rate and regular rhythm.  ?   Pulses:     ?     Dorsalis pedis pulses are 2+ on the right side and 2+ on the left side.  ?     Posterior tibial pulses are 2+ on the right side and 2+ on the left side.  ?   Heart sounds: Normal heart sounds. No murmur heard. ?  No gallop.  ?Pulmonary:  ?   Effort: Pulmonary effort is normal. No respiratory distress.  ?   Breath sounds: Normal breath sounds. No wheezing or rales.  ?Musculoskeletal:  ?   Right lower leg: 1+ Edema present.  ?   Left lower leg: 1+ Edema present.  ?Skin: ?   General: Skin is warm and dry.  ?Neurological:  ?   Mental Status: She is alert and oriented to person, place, and time.  ?Psychiatric:     ?    Mood and Affect: Mood normal.     ?   Behavior: Behavior normal.     ?   Judgment: Judgment normal.  ? ? ?BP (!) 129/58 (BP Location: Right Arm, Patient Position: Sitting, Cuff Size: Large)   Pulse 78   Temp 98

## 2021-05-13 NOTE — Assessment & Plan Note (Signed)
EKG tracing is personally reviewed.  EKG notes NSR with 1st degree AV block.  No acute changes. Given her associated DOE and hx of DM2, I would like to her to see cardiology for further evaluation.  ? ?

## 2021-05-13 NOTE — Patient Instructions (Addendum)
Please schedule your diabetic eye exam.  ?Please complete lab work prior to leaving.  ?

## 2021-05-14 LAB — HEMOGLOBIN A1C
Hgb A1c MFr Bld: 6.7 % of total Hgb — ABNORMAL HIGH (ref <5.7)
Mean Plasma Glucose: 146 mg/dL
eAG (mmol/L): 8.1 mmol/L

## 2021-05-14 LAB — COMPREHENSIVE METABOLIC PANEL
AG Ratio: 1.4 (calc) (ref 1.0–2.5)
ALT: 20 U/L (ref 6–29)
AST: 19 U/L (ref 10–35)
Albumin: 4.2 g/dL (ref 3.6–5.1)
Alkaline phosphatase (APISO): 63 U/L (ref 37–153)
BUN: 20 mg/dL (ref 7–25)
CO2: 25 mmol/L (ref 20–32)
Calcium: 9.8 mg/dL (ref 8.6–10.4)
Chloride: 103 mmol/L (ref 98–110)
Creat: 0.94 mg/dL (ref 0.60–1.00)
Globulin: 3 g/dL (calc) (ref 1.9–3.7)
Glucose, Bld: 84 mg/dL (ref 65–99)
Potassium: 4.1 mmol/L (ref 3.5–5.3)
Sodium: 141 mmol/L (ref 135–146)
Total Bilirubin: 0.3 mg/dL (ref 0.2–1.2)
Total Protein: 7.2 g/dL (ref 6.1–8.1)

## 2021-05-14 LAB — LIPID PANEL
Cholesterol: 86 mg/dL (ref <200)
HDL: 49 mg/dL — ABNORMAL LOW (ref >=50)
LDL Cholesterol (Calc): 11 mg/dL (calc)
Non-HDL Cholesterol (Calc): 37 mg/dL (calc) (ref <130)
Total CHOL/HDL Ratio: 1.8 (calc) (ref <5.0)
Triglycerides: 191 mg/dL — ABNORMAL HIGH (ref <150)

## 2021-05-17 ENCOUNTER — Ambulatory Visit: Payer: Medicare Other | Admitting: Family

## 2021-05-23 ENCOUNTER — Ambulatory Visit: Payer: Medicare Other | Admitting: Gastroenterology

## 2021-05-23 ENCOUNTER — Encounter: Payer: Self-pay | Admitting: Gastroenterology

## 2021-05-23 VITALS — BP 138/58 | HR 88 | Ht 62.0 in | Wt 186.4 lb

## 2021-05-23 DIAGNOSIS — K219 Gastro-esophageal reflux disease without esophagitis: Secondary | ICD-10-CM

## 2021-05-23 DIAGNOSIS — R110 Nausea: Secondary | ICD-10-CM | POA: Diagnosis not present

## 2021-05-23 DIAGNOSIS — R1310 Dysphagia, unspecified: Secondary | ICD-10-CM | POA: Diagnosis not present

## 2021-05-23 MED ORDER — ONDANSETRON HCL 4 MG PO TABS: 4.0000 mg | ORAL_TABLET | Freq: Two times a day (BID) | ORAL | 3 refills | Status: DC | PRN

## 2021-05-23 NOTE — Patient Instructions (Signed)
If you are age 71 or older, your body mass index should be between 23-30. Your Body mass index is 34.09 kg/m?Marland Kitchen If this is out of the aforementioned range listed, please consider follow up with your Primary Care Provider. ?________________________________________________________ ? ?The [facility] providers would like to encourage you to use [facility] to communicate with providers for non-urgent requests or questions.  Due to long hold times on the telephone, sending your provider a message by [facility] may be a faster and more efficient way to get a response.  Please allow 48 business hours for a response.  Please remember that this is for non-urgent requests.  ?_______________________________________________________ ? ?We have sent the following medications to your pharmacy for you to pick up at your convenience: ? ?Zofran ? ?Thank you for entrusting me with your care and choosing [facility]. ? ?Dr Ardis Hughs ? ?

## 2021-05-23 NOTE — Progress Notes (Signed)
Review of pertinent gastrointestinal problems: ?1.  Increased risk for colon cancer.  Her brother had colon cancer.  Colonoscopy October 2013 showed a single small polyp which was not neoplastic.  Colonoscopy 12/2019 five subcentimeter adenomas were removed.  She was recommended to have repeat colonoscopy at 3-year interval ?2.  Perforated appendicitis, abscess Feb2021: Treated with drain placement and then eventual surgery 06/2019 ?3.  Dyspepsia, belching, dysphagia led to EGD 12/2019; mild nonspecific gastritis and some linear erythema were biopsied.  These showed no evidence of gave, no evidence of H. Pylori.  Gastric emptying scan ordered by her primary care provider 05/2020 was normal ? ? ?HPI: ?This is a very pleasant 71 year old woman ? ? ?I saw her 1 year ago, March 2022.  At that time we discussed her dyspepsia and belching.  She had not at that time follow through with several of the medication adjustments which I recommended that she make previously.  I reiterated the recommendations.   She was to start taking Gas-X 1 pill shortly before every meal.  start taking pantoprazole 40 mg pills shortly before breakfast every morning.  I gave her a new prescription for that.  She was to continue taking her famotidine 20 mg 1 pill at bedtime every night.  I refilled her Zofran prescription 4 mg pills 1 pill twice daily as needed for nausea.  60 pills, 6 refills.  I recommended follow-up in 3 months however we have not heard from her since then. ? ?Her weight 1 year ago was 186 pounds. ? ?Labs 04/2021 hemoglobin A1c 6.7 ? ?She has done very well in the past year.  She has completely stopped taking the famotidine.  She is taking pantoprazole every single day, sometimes after breakfast sometimes before.  She has not needed Zofran in 2 or 3 months at least but she would like refills just in case. ? ?Her weight is overall stable. ? ? ?ROS: complete GI ROS as described in HPI, all other review negative. ? ?Constitutional:   No unintentional weight loss ? ? ?Past Medical History:  ?Diagnosis Date  ? Acute perforated appendicitis 04/02/2019  ? Anemia   ? Arthritis   ? Depression   ? Diabetes mellitus without complication ([place])   ? Dysrhythmia   ? Over 10 years ago  ? Fatty liver   ? Gum disease   ? Heart murmur   ? History of syphilis   ? Hypertension   ? Irregular heart beat   ? Personal history of colonic polyps   ? Phlebitis   ? Pre-diabetes   ? ? ?Past Surgical History:  ?Procedure Laterality Date  ? CYSTECTOMY    ? between breasts  ? IR RADIOLOGIST EVAL & MGMT  04/15/2019  ? IR RADIOLOGIST EVAL & MGMT  04/29/2019  ? IR RADIOLOGIST EVAL & MGMT  05/08/2019  ? LAPAROSCOPIC APPENDECTOMY N/A 06/30/2019  ? Procedure: APPENDECTOMY LAPAROSCOPIC;  Surgeon: Ralene Ok, MD;  Location: [place];  Service: General;  Laterality: N/A;  ? SHOULDER ARTHROSCOPY Right 12/2015  ? SPINE SURGERY  08/20/2015  ? bulging / herniated disc in C-spine  ? THERAPEUTIC ABORTION  1970. 1974, 1978  ? ? ?Current Outpatient Medications  ?Medication Instructions  ? acetaminophen (TYLENOL) 650 mg, Oral, 2 times daily  ? aspirin EC 81 mg, Oral, Daily with lunch  ? blood glucose meter kit and supplies KIT Dispense based on patient and insurance preference. Use up to four times daily as directed. (FOR ICD-9 250.00, 250.01).  ? calcium  carbonate (OSCAL) 1,200 mg, Oral, Daily with lunch, 800 units vitamin D  ? famotidine (PEPCID) 20 MG tablet TAKE 1 TABLET BY MOUTH AT  BEDTIME FOR BURPING  ? glucose blood (ONETOUCH VERIO) test strip Check blood sugars daily  ? hydrALAZINE (APRESOLINE) 25 mg, Oral, 3 times daily  ? hydrochlorothiazide (HYDRODIURIL) 25 mg, Oral, Daily  ? meloxicam (MOBIC) 7.5 mg, Oral, Daily  ? metoprolol succinate (TOPROL-XL) 50 MG 24 hr tablet TAKE 1 AND 1/2 TABLETS BY  MOUTH DAILY TAKE WITH OR  IMMEDIATELY FOLLOWING A  MEAL  ? Multiple Vitamins-Minerals (MULTIVITAMIN WITH MINERALS) tablet 1 tablet, Oral, Daily  ? ondansetron (ZOFRAN) 4 mg, Oral, 2 times daily  PRN  ? pantoprazole (PROTONIX) 40 MG tablet TAKE 1 TABLET BY MOUTH  DAILY  ? polyethylene glycol (MIRALAX / GLYCOLAX) 17 g, Daily PRN  ? ? ?Allergies as of 05/23/2021 - Review Complete 05/23/2021  ?Allergen Reaction Noted  ? Aldactone [spironolactone] Other (See Comments) 07/05/2015  ? Lisinopril  02/15/2021  ? ? ?Family History  ?Problem Relation Age of Onset  ? Diabetes Mother   ? Cancer Father   ?     lung cancer  ? Kidney disease Father   ? Seizures Father   ? Colon cancer Brother   ? Heart attack Brother   ? Stroke Brother   ? Stomach cancer Neg Hx   ? Esophageal cancer Neg Hx   ? Rectal cancer Neg Hx   ? ? ?Social History  ? ?Socioeconomic History  ? Marital status: Divorced  ?  Spouse name: Not on file  ? Number of children: 1  ? Years of education: Not on file  ? Highest education level: Not on file  ?Occupational History  ? Not on file  ?Tobacco Use  ? Smoking status: Former  ?  Packs/day: 1.00  ?  Years: 40.00  ?  Pack years: 40.00  ?  Types: Cigarettes  ?  Quit date: 2011  ?  Years since quitting: 12.2  ? Smokeless tobacco: Never  ?Vaping Use  ? Vaping Use: Never used  ?Substance and Sexual Activity  ? Alcohol use: No  ? Drug use: No  ? Sexual activity: Not on file  ?Other Topics Concern  ? Not on file  ?Social History Narrative  ? Regular exercise:  No  ? Caffeine Use:  No  ? 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 Divorced  ? Son Age 62- alive and well  ? 2 grand daughters- age 59 and an adopted grandaughter age 15  ? 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   ?   ?   ? ?Social Determinants of Health  ? ?Financial Resource Strain: Low Risk   ? Difficulty of Paying Living Expenses: Not very hard  ?Food Insecurity: Not on file  ?Transportation Needs: Not on file  ?Physical Activity: Inactive  ? Days of Exercise per Week: 0 days  ? Minutes of Exercise per Session: 0 min  ?Stress: No  Stress Concern Present  ? Feeling of Stress : Only a little  ?Social Connections: Moderately Integrated  ? Frequency of Communication with Friends and Family: More than three times a week  ? Frequency of Social Gatherings with Friends and Family: More than three times a week  ? Attends Religious Services: More than 4 times per year  ? Active Member of Clubs or Organizations: Yes  ?  Attends Archivist Meetings: More than 4 times per year  ? Marital Status: Divorced  ?Intimate Partner Violence: Not At Risk  ? Fear of Current or Ex-Partner: No  ? Emotionally Abused: No  ? Physically Abused: No  ? Sexually Abused: No  ? ? ? ?Physical Exam: ?BP (!) 138/58 (BP Location: Left Arm, Cuff Size: Normal)   Pulse 88   Ht '5\' 2"'  (1.575 m)   Wt 186 lb 6 oz (84.5 kg)   BMI 34.09 kg/m?  ?Constitutional: generally well-appearing ?Psychiatric: alert and oriented x3 ?Abdomen: soft, nontender, nondistended, no obvious ascites, no peritoneal signs, normal bowel sounds ?No peripheral edema noted in lower extremities ? ?Assessment and plan: ?71 y.o. female with dyspepsia, GERD, nausea ? ?I think GERD is playing a role in her symptoms.  She is taking pantoprazole every morning around breakfast time and her symptoms have completely resolved.  I am writing her prescription for Zofran to take on a as needed basis because that was a big bother for her and she would like to have some on hand just in case. ? ?She knows to call if she has further questions or concerns ? ?Please see the "Patient Instructions" section for addition details about the plan. ? ?Owens Loffler, MD ?[facility] ?05/23/2021, 1:33 PM ? ? ?Total time on date of encounter was 20 minutes (this included time spent preparing to see the patient reviewing records; obtaining and/or reviewing separately obtained history; performing a medically appropriate exam and/or evaluation; counseling and educating the patient and family if present; ordering medications,  tests or procedures if applicable; and documenting clinical information in the health record). ? ? ?

## 2021-05-24 ENCOUNTER — Ambulatory Visit (HOSPITAL_BASED_OUTPATIENT_CLINIC_OR_DEPARTMENT_OTHER)
Admission: RE | Admit: 2021-05-24 | Discharge: 2021-05-24 | Disposition: A | Discharge status: Home / Self Care | Payer: Medicare Other | Source: Ambulatory Visit | Attending: Family | Admitting: Family

## 2021-05-24 ENCOUNTER — Other Ambulatory Visit: Payer: Self-pay

## 2021-05-24 ENCOUNTER — Encounter (HOSPITAL_BASED_OUTPATIENT_CLINIC_OR_DEPARTMENT_OTHER): Payer: Self-pay

## 2021-05-24 DIAGNOSIS — Z1231 Encounter for screening mammogram for malignant neoplasm of breast: Secondary | ICD-10-CM | POA: Insufficient documentation

## 2021-05-26 LAB — HM HEPATITIS C SCREENING LAB: HM Hepatitis Screen: NEGATIVE

## 2021-06-01 ENCOUNTER — Encounter: Payer: Self-pay | Admitting: Cardiovascular Disease

## 2021-06-01 ENCOUNTER — Ambulatory Visit: Payer: Medicare Other | Admitting: Cardiovascular Disease

## 2021-06-01 DIAGNOSIS — R0609 Other forms of dyspnea: Secondary | ICD-10-CM

## 2021-06-01 DIAGNOSIS — R7309 Other abnormal glucose: Secondary | ICD-10-CM

## 2021-06-01 DIAGNOSIS — E669 Obesity, unspecified: Secondary | ICD-10-CM

## 2021-06-01 DIAGNOSIS — Z87891 Personal history of nicotine dependence: Secondary | ICD-10-CM

## 2021-06-01 DIAGNOSIS — E781 Pure hyperglyceridemia: Secondary | ICD-10-CM | POA: Diagnosis not present

## 2021-06-01 DIAGNOSIS — R0789 Other chest pain: Secondary | ICD-10-CM | POA: Diagnosis not present

## 2021-06-01 DIAGNOSIS — R072 Precordial pain: Secondary | ICD-10-CM

## 2021-06-01 MED ORDER — METOPROLOL TARTRATE 100 MG PO TABS
100.0000 mg | ORAL_TABLET | Freq: Once | ORAL | 0 refills | Status: DC
Start: 2021-06-01 — End: 2021-06-30

## 2021-06-01 NOTE — Patient Instructions (Addendum)
Medication Instructions:  ?Your physician recommends that you continue on your current medications as directed. Please refer to the Current Medication list given to you today. ? ?*If you need a refill on your cardiac medications before your next appointment, please call your pharmacy* ? ? ?Lab Work: ?Your physician recommends that you have labs drawn today: BMET ? ?If you have labs (blood work) drawn today and your tests are completely normal, you will receive your results only by: ?MyChart Message (if you have MyChart) OR ?A paper copy in the mail ?If you have any lab test that is abnormal or we need to change your treatment, we will call you to review the results. ? ? ?Testing/Procedures: ?Your physician has requested that you have an echocardiogram. Echocardiography is a painless test that uses sound waves to create images of your heart. It provides your doctor with information about the size and shape of your heart and how well your heart?s chambers and valves are working. This procedure takes approximately one hour. There are no restrictions for this procedure. This procedure will be done at [address] ? ? ? ?Follow-Up: ?At [facility], you and your health needs are our priority.  As part of our continuing mission to provide you with exceptional heart care, we have created designated Provider Care Teams.  These Care Teams include your primary Cardiologist (physician) and Advanced Practice Providers (APPs -  Physician Assistants and Nurse Practitioners) who all work together to provide you with the care you need, when you need it. ? ?We recommend signing up for the patient portal called "MyChart".  Sign up information is provided on this After Visit Summary.  MyChart is used to connect with patients for Virtual Visits (Telemedicine).  Patients are able to view lab/test results, encounter notes, upcoming appointments, etc.  Non-urgent messages can be sent to your provider as well.   ?To learn  more about what you can do with MyChart, go to [email or web address].   ? ?Your next appointment:   ?4-6 week(s) ? ?The format for your next appointment:   ?In Person ? ?Provider:   ?Shelva Majestic, MD ? ?Other Instructions ? ? ?Your cardiac CT will be scheduled at the below location:  ? ?[facility] ?[address] ?[place], [place] [address] ?[phone] ? ? ?If scheduled at [facility], please arrive at the [facility] and Children's Entrance (Entrance C2) of [facility] 30 minutes prior to test start time. ?You can use the FREE valet parking offered at entrance C (encouraged to control the heart rate for the test)  ?Proceed to the [facility] Radiology Department (first floor) to check-in and test prep. ? ?All radiology patients and guests should use entrance C2 at [facility], accessed from [facility], even though the hospital's physical address listed is [address]. ? ? ? ?Please follow these instructions carefully (unless otherwise directed): ? ? ?On the Night Before the Test: ?Be sure to Drink plenty of water. ?Do not consume any caffeinated/decaffeinated beverages or chocolate 12 hours prior to your test. ?Do not take any antihistamines 12 hours prior to your test. ? ?On the Day of the Test: ?Drink plenty of water until 1 hour prior to the test. ?Do not eat any food 4 hours prior to the test. ?You may take your regular medications prior to the test.  ?Take metoprolol (Lopressor)'100mg'$  two hours prior to test. (Hold metoprolol succinate on day of procedure) ?HOLD Furosemide/Hydrochlorothiazide  morning of the test. ?FEMALES- please wear underwire-free bra if available, avoid dresses & tight clothing ? ?     ?After the Test: ?Drink plenty of water. ?After receiving IV contrast, you may experience a mild flushed feeling. This is normal. ?On occasion, you may experience a mild rash up to 24 hours after the test. This is not dangerous. If this occurs, you  can take Benadryl 25 mg and increase your fluid intake. ?If you experience trouble breathing, this can be serious. If it is severe call 911 IMMEDIATELY. If it is mild, please call our office. ?If you take any of these medications: Glipizide/Metformin, Avandament, Glucavance, please do not take 48 hours after completing test unless otherwise instructed. ? ?We will call to schedule your test 2-4 weeks out understanding that some insurance companies will need an authorization prior to the service being performed.  ? ?For non-scheduling related questions, please contact the cardiac imaging nurse navigator should you have any questions/concerns: ?Marchia Bond, Cardiac Imaging Nurse Navigator ?Gordy Clement, Cardiac Imaging Nurse Navigator ?[facility] ?Direct Office Dial: [phone]  ? ?For scheduling needs, including cancellations and rescheduling, please call [place], [phone]. ? ? ?

## 2021-06-01 NOTE — Progress Notes (Signed)
? ?Cardiology Office Note   ? ?Date:  06/09/2021  ? ?ID:  Cassidy Harrell, DOB September 16, 1950, MRN [id number] ? ?PCP:  [name], NP  ?Cardiologist:  Shelva Majestic, MD  ? ?New cardiology evaluation referred through the courtesy of  [name], NP for evaluation of shortness of breath and occasional sharp chest pains. ? ? ?History of Present Illness:  ?Cassidy Harrell is a 71 y.o. female who is followed by [name], NP.  She has a history of hypertension, irregular heart rhythm and prior heart murmur.  Only, she had seen Dr. Radford Pax in 2017 who felt her chest pain was atypical.  Due to her risk factors including prediabetes, remote tobacco use and family history for premature CAD, she underwent a nuclear stress test on Jul 13, 2015 which showed nondiagnostic upsloping depression.  EF was 57% and there was mild thinning noted in the mid anterior and apical location consistent with breast attenuation artifact.  The study was considered low risk and normal.  An echo Doppler study from May 2017 showed an EF of 40 to 45% with diffuse hypokinesis and mild aortic and mitral regurgitation. ? ?Cassidy Harrell has recently experienced some shortness of breath and very rarely sharp chest pain which lasts seconds and is nonexertional.  She has a history of hypertension for approximately 15 years.  She does not exercise and gave away a treadmill that she had remotely purchased.  Presently, she is on hydralazine 25 mg 3 times a day, hydrochlorothiazide 25 mg daily, and has been taking metoprolol succinate 75 mg daily.  She takes meloxicam for chronic low back pain 7.5 mg daily.  She has a history of GERD for which she is on pantoprazole.  She is on aspirin.  She presents for evaluation. ? ? ?Past Medical History:  ?Diagnosis Date  ? Acute perforated appendicitis 04/02/2019  ? Anemia   ? Arthritis   ? Depression   ? Diabetes mellitus without complication ([place])   ? Dysrhythmia   ? Over 10 years ago  ? Fatty liver    ? Gum disease   ? Heart murmur   ? History of syphilis   ? Hypertension   ? Irregular heart beat   ? Personal history of colonic polyps   ? Phlebitis   ? Pre-diabetes   ? ? ?Past Surgical History:  ?Procedure Laterality Date  ? CYSTECTOMY    ? between breasts  ? IR RADIOLOGIST EVAL & MGMT  04/15/2019  ? IR RADIOLOGIST EVAL & MGMT  04/29/2019  ? IR RADIOLOGIST EVAL & MGMT  05/08/2019  ? LAPAROSCOPIC APPENDECTOMY N/A 06/30/2019  ? Procedure: APPENDECTOMY LAPAROSCOPIC;  Surgeon: Ralene Ok, MD;  Location: [facility];  Service: General;  Laterality: N/A;  ? SHOULDER ARTHROSCOPY Right 12/2015  ? SPINE SURGERY  08/20/2015  ? bulging / herniated disc in C-spine  ? THERAPEUTIC ABORTION  1970. 1974, 1978  ? ? ?Current Medications: ?Outpatient Medications Prior to Visit  ?Medication Sig Dispense Refill  ? acetaminophen (TYLENOL) 650 MG CR tablet Take 650 mg by mouth in the morning and at bedtime.    ? aspirin EC 81 MG tablet Take 81 mg by mouth daily with lunch.    ? blood glucose meter kit and supplies KIT Dispense based on patient and insurance preference. Use up to four times daily as directed. (FOR ICD-9 250.00, 250.01). 1 each 0  ? calcium carbonate (OSCAL) 1500 (600 Ca) MG TABS tablet Take 1,200 mg by mouth daily with lunch.  800 units vitamin D    ? famotidine (PEPCID) 20 MG tablet TAKE 1 TABLET BY MOUTH AT  BEDTIME FOR BURPING 90 tablet 0  ? glucose blood (ONETOUCH VERIO) test strip Check blood sugars daily 100 each 12  ? hydrochlorothiazide (HYDRODIURIL) 25 MG tablet Take 1 tablet (25 mg total) by mouth daily. 90 tablet 1  ? meloxicam (MOBIC) 7.5 MG tablet Take 1 tablet (7.5 mg total) by mouth daily. 90 tablet 1  ? metoprolol succinate (TOPROL-XL) 50 MG 24 hr tablet TAKE 1 AND 1/2 TABLETS BY  MOUTH DAILY TAKE WITH OR  IMMEDIATELY FOLLOWING A  MEAL 135 tablet 1  ? Multiple Vitamins-Minerals (MULTIVITAMIN WITH MINERALS) tablet Take 1 tablet by mouth daily.    ? ondansetron (ZOFRAN) 4 MG tablet Take 1 tablet (4 mg total) by  mouth 2 (two) times daily as needed. 60 tablet 3  ? pantoprazole (PROTONIX) 40 MG tablet TAKE 1 TABLET BY MOUTH  DAILY 90 tablet 3  ? polyethylene glycol (MIRALAX / GLYCOLAX) 17 g packet Take 17 g by mouth daily as needed.    ? hydrALAZINE (APRESOLINE) 25 MG tablet Take 1 tablet (25 mg total) by mouth 3 (three) times daily. 270 tablet 1  ? ?No facility-administered medications prior to visit.  ?  ? ?Allergies:   Aldactone [spironolactone] and Lisinopril  ? ?Social History  ? ?Socioeconomic History  ? Marital status: Divorced  ?  Spouse name: Not on file  ? Number of children: 1  ? Years of education: Not on file  ? Highest education level: Not on file  ?Occupational History  ? Not on file  ?Tobacco Use  ? Smoking status: Former  ?  Packs/day: 1.00  ?  Years: 40.00  ?  Pack years: 40.00  ?  Types: Cigarettes  ?  Quit date: 2011  ?  Years since quitting: 12.2  ? Smokeless tobacco: Never  ?Vaping Use  ? Vaping Use: Never used  ?Substance and Sexual Activity  ? Alcohol use: No  ? Drug use: No  ? Sexual activity: Not on file  ?Other Topics Concern  ? Not on file  ?Social History Narrative  ? Regular exercise:  No  ? Caffeine Use:  No  ? 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 Divorced  ? Son Age 34- alive and well  ? 2 grand daughters- age 69 and an adopted grandaughter age 28  ? 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   ?   ?   ? ?Social Determinants of Health  ? ?Financial Resource Strain: Low Risk   ? Difficulty of Paying Living Expenses: Not very hard  ?Food Insecurity: Not on file  ?Transportation Needs: Not on file  ?Physical Activity: Inactive  ? Days of Exercise per Week: 0 days  ? Minutes of Exercise per Session: 0 min  ?Stress: No Stress Concern Present  ? Feeling of Stress : Only a little  ?Social Connections: Moderately Integrated  ? Frequency of Communication with Friends and Family:  More than three times a week  ? Frequency of Social Gatherings with Friends and Family: More than three times a week  ? Attends Religious Services: More than 4 times per year  ? Active Member of Clubs or Organizations: Yes  ? Attends Archivist Meetings: More than 4 times per year  ? Marital Status: Divorced  ?  ?Socially,  she was born in [place].  She is divorced for 23 years.  She has 1 child and 3 grandchildren.  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  She has significant tobacco history smoking 1-1/2 packs/day for 42 years.  She quit tobacco in September 2011.  She does not drink alcohol.  She does not exercise. ? ?Family History:  The patient's family history includes Cancer in her father; Colon cancer in her brother; Diabetes in her mother; Heart attack in her brother; Kidney disease in her father; Seizures in her father; Stroke in her brother.  ? ?Her mother is living at 75 and has diabetes, hypertension, and asthma.  Father died at age 9 with throat cancer.  She has 3 living brothers and 1 deceased brother who died at age 45 with heart disease and a stroke.  She has 3 living sisters with hypertension and asthma and diabetes. ? ?ROS ?General: Negative; No fevers, chills, or night sweats;  ?HEENT: Negative; No changes in vision or hearing, sinus congestion, difficulty swallowing ?Pulmonary: Negative; No cough, wheezing, shortness of breath, hemoptysis ?Cardiovascular: See HPI ?GI: Negative; No nausea, vomiting, diarrhea, or abdominal pain ?GU: Negative; No dysuria, hematuria, or difficulty voiding ?Musculoskeletal: Negative; no myalgias, joint pain, or weakness ?Hematologic/Oncology: Negative; no easy bruising, bleeding ?Endocrine: Negative; no heat/cold intolerance; no diabetes ?Neuro: Negative; no changes in balance, headaches ?Skin: Negative; No rashes or skin lesions ?Psychiatric: Negative; No behavioral problems, depression ?Sleep: Negative;  No snoring, daytime sleepiness, hypersomnolence, bruxism, restless legs, hypnogognic hallucinations, no cataplexy ?Other comprehensive 14 point system review is negative. ? ? ?PHYSICAL EXAM:   ?VS:  BP 1

## 2021-06-05 ENCOUNTER — Other Ambulatory Visit: Payer: Self-pay | Admitting: Family

## 2021-06-09 ENCOUNTER — Encounter: Payer: Self-pay | Admitting: Cardiovascular Disease

## 2021-06-13 ENCOUNTER — Ambulatory Visit (HOSPITAL_COMMUNITY): Payer: Medicare Other | Attending: Cardiology

## 2021-06-13 DIAGNOSIS — R0609 Other forms of dyspnea: Secondary | ICD-10-CM | POA: Diagnosis not present

## 2021-06-13 DIAGNOSIS — R0789 Other chest pain: Secondary | ICD-10-CM | POA: Diagnosis not present

## 2021-06-13 DIAGNOSIS — R072 Precordial pain: Secondary | ICD-10-CM | POA: Diagnosis not present

## 2021-06-13 LAB — BASIC METABOLIC PANEL
BUN/Creatinine Ratio: 15 (ref 12–28)
BUN: 16 mg/dL (ref 8–27)
CO2: 29 mmol/L (ref 20–29)
Calcium: 10 mg/dL (ref 8.7–10.3)
Chloride: 99 mmol/L (ref 96–106)
Creatinine, Ser: 1.05 mg/dL — ABNORMAL HIGH (ref 0.57–1.00)
Glucose: 110 mg/dL — ABNORMAL HIGH (ref 70–99)
Potassium: 3.8 mmol/L (ref 3.5–5.2)
Sodium: 137 mmol/L (ref 134–144)
eGFR: 57 mL/min/{1.73_m2} — ABNORMAL LOW (ref >59)

## 2021-06-13 LAB — ECHOCARDIOGRAM COMPLETE
Area-P 1/2: 3.77 cm2
P 1/2 time: 398 msec
S' Lateral: 2.4 cm

## 2021-06-20 ENCOUNTER — Telehealth (HOSPITAL_COMMUNITY): Payer: Self-pay | Admitting: Emergency Medicine

## 2021-06-20 NOTE — Telephone Encounter (Signed)
Reaching out to patient to offer assistance regarding upcoming cardiac imaging study; pt verbalizes understanding of appt date/time, parking situation and where to check in, pre-test NPO status and medications ordered, and verified current allergies; name and call back number provided for further questions should they arise ?Marchia Bond RN Navigator Cardiac Imaging ? Heart and Vascular ?[phone] office ?[phone] cell ? ?Denies iv issues ?'100mg'$  metoprolol tartrate ?Arrival 130 ? ?

## 2021-06-21 ENCOUNTER — Ambulatory Visit (HOSPITAL_COMMUNITY)
Admission: RE | Admit: 2021-06-21 | Discharge: 2021-06-21 | Disposition: A | Discharge status: Home / Self Care | Payer: Medicare Other | Source: Ambulatory Visit | Attending: Cardiovascular Disease | Admitting: Cardiovascular Disease

## 2021-06-21 ENCOUNTER — Encounter (HOSPITAL_COMMUNITY): Payer: Self-pay

## 2021-06-21 DIAGNOSIS — R0789 Other chest pain: Secondary | ICD-10-CM | POA: Diagnosis not present

## 2021-06-21 DIAGNOSIS — I700 Atherosclerosis of aorta: Secondary | ICD-10-CM | POA: Insufficient documentation

## 2021-06-21 DIAGNOSIS — J479 Bronchiectasis, uncomplicated: Secondary | ICD-10-CM | POA: Insufficient documentation

## 2021-06-21 MED ORDER — NITROGLYCERIN 0.4 MG SL SUBL
SUBLINGUAL_TABLET | SUBLINGUAL | Status: AC
  Administered 2021-06-21: 0.8 mg via SUBLINGUAL
  Filled 2021-06-21: qty 2

## 2021-06-21 MED ORDER — NITROGLYCERIN 0.4 MG SL SUBL
0.8000 mg | SUBLINGUAL_TABLET | Freq: Once | SUBLINGUAL | Status: AC
Start: 2021-06-21 — End: 2021-06-21

## 2021-06-21 MED ORDER — IOHEXOL 350 MG/ML SOLN
100.0000 mL | Freq: Once | INTRAVENOUS | Status: AC | PRN
  Administered 2021-06-21: 100 mL via INTRAVENOUS

## 2021-06-21 NOTE — Progress Notes (Addendum)
This patient has received approx 40 ml's of normal saline extravasation into her Right antecubital during a CT Coronary exam. ? ?The exam was performed on (date) Tuesday, 06/21/21 ? ?Site / affected area assessed by Rushie Nyhan, Radiology NP ? ? ?

## 2021-06-21 NOTE — Progress Notes (Signed)
?  Evaluation after Contrast Extravasation ? ?Patient seen and examined immediately after contrast extravasation while in CT scanner # 4. ? ?Exam: ?There is moderate swelling at the right forearm area.  ?There is no erythema. ?There is no discoloration. ?There are no blisters. ?There are no signs of decreased perfusion of the skin.  ?It is warm to touch.  ?The patient has full ROM in fingers.  ?Radial pulse is normal. ? ?Per contrast extravasation protocol, I have instructed the patient to keep an ice pack on the area for 20-60 minutes at a time for about 48 hours. ?  ?Keep arm elevated as much as possible. ?  ?The patient understands to call the radiology department if there is: ?- increase in pain or swelling ?- changed or altered sensation ?- ulceration or blistering ?- increasing redness ?- warmth or increasing firmness ?- decreased tissue perfusion as noted by decreased capillary refill or discoloration of skin ?- decreased pulses peripheral to site ? ? ?Jacqualine Mau PA-C ?06/21/2021 ?2:47 PM ? ? ? ? ?

## 2021-06-21 NOTE — Discharge Instructions (Signed)
Cassidy Harrell    [id number]   [birth year] ? ?Type of procedure: CT Coronary with contrast ? ?06/21/2021 ? ?During your examination at [facility] some of the x-ray dye leaked into the tissues around the vein that the x-ray dye was given through. ? ?What should you do? ? ?Apply ice 20 minutes four times a day for three days. ? ?Keep the affected extremity elevated above the rest of your body for 48 hours. ? ?If you develop any of the following signs or symptoms, please contact Radiology at [phone] or [phone]. ?Increased pain ?Increasing swelling  ?Change in sensation (ex. Numbness, tingling) ?Development of redness or increase in warmth around the affected area ?Increasing hardness ?Blistering ? ? ?I have read and understand these instructions.  Any questions I raised have been discussed to my satisfaction.  I understand that failure to follow these instructions may result in additional complications. ? ? ? ? ? ? ? ? ? ? ?

## 2021-06-29 ENCOUNTER — Other Ambulatory Visit: Payer: Self-pay

## 2021-06-29 MED ORDER — ONDANSETRON HCL 4 MG PO TABS: 4.0000 mg | ORAL_TABLET | Freq: Two times a day (BID) | ORAL | 1 refills | Status: DC | PRN

## 2021-06-30 ENCOUNTER — Ambulatory Visit: Payer: Medicare Other | Admitting: Cardiovascular Disease

## 2021-06-30 ENCOUNTER — Encounter: Payer: Self-pay | Admitting: Cardiovascular Disease

## 2021-06-30 DIAGNOSIS — I050 Rheumatic mitral stenosis: Secondary | ICD-10-CM | POA: Diagnosis not present

## 2021-06-30 DIAGNOSIS — Z87891 Personal history of nicotine dependence: Secondary | ICD-10-CM | POA: Diagnosis not present

## 2021-06-30 DIAGNOSIS — E781 Pure hyperglyceridemia: Secondary | ICD-10-CM | POA: Diagnosis not present

## 2021-06-30 DIAGNOSIS — R0789 Other chest pain: Secondary | ICD-10-CM

## 2021-06-30 DIAGNOSIS — E669 Obesity, unspecified: Secondary | ICD-10-CM

## 2021-06-30 DIAGNOSIS — J479 Bronchiectasis, uncomplicated: Secondary | ICD-10-CM | POA: Diagnosis not present

## 2021-06-30 DIAGNOSIS — R0609 Other forms of dyspnea: Secondary | ICD-10-CM | POA: Diagnosis not present

## 2021-06-30 NOTE — Progress Notes (Signed)
? ?Cardiology Office Note   ? ?Date:  07/09/2021  ? ?ID:  Cassidy Harrell, DOB 03-Dec-1950, MRN [id number] ? ?PCP:  [name], NP  ?Cardiologist:  Shelva Majestic, MD  ? ?One month F/U cardiology evaluation initially referred through the courtesy of  [name], NP for evaluation of shortness of breath and occasional sharp chest pains. ? ? ?History of Present Illness:  ?Cassidy Harrell is a 71 y.o. female who is followed by [name], NP.  She has a history of hypertension, irregular heart rhythm and prior heart murmur.  Remotely, she had seen Dr. Radford Pax in 2017 who felt her chest pain was atypical.  Due to her risk factors including prediabetes, remote tobacco use and family history for premature CAD, she underwent a nuclear stress test on Jul 13, 2015 which showed nondiagnostic upsloping depression.  EF was 57% and there was mild thinning noted in the mid anterior and apical location consistent with breast attenuation artifact.  The study was considered low risk and normal.  An echo Doppler study from May 2017 showed an EF of 40 to 45% with diffuse hypokinesis and mild aortic and mitral regurgitation. ? ?I saw her for my initial evaluation on June 01, 2021 and she recently had  experienced some shortness of breath and very rarely sharp chest pain which lasts seconds and is nonexertional.  She has a history of hypertension for approximately 15 years.  She does not exercise and gave away a treadmill that she had remotely purchased.  Presently, she is on hydralazine 25 mg 3 times a day, hydrochlorothiazide 25 mg daily, and has been taking metoprolol succinate 75 mg daily.  She takes meloxicam for chronic low back pain 7.5 mg daily.  She has a history of GERD for which she is on pantoprazole.  She is on aspirin.  I felt her chest pain was most likely atypical and nonischemic.  I recommended that she undergo a 2D echo Doppler study as well as a coronary CTA for further evaluation. ? ?An echo  Doppler study on June 13, 2021  showed normal EF at 60 to 65%.  She had normal wall motion.  There was moderate LVH.  There was a suggestion of very mild mitral stenosis with a 40 mm gradient and trivial MR.  She underwent coronary CTA on June 21, 2021.  Calcium score was 0 and there was no evidence for CAD.  Chest CT did not show any acute extracardiac findings but there was stable mild cylindrical bronchiectasis of both lower lobes along with right basilar scarring and stable hepatic hypodense lesions favoring cysts. ? ?Presently, she feels well.  She denies any recurrent chest pain symptomatology.  Over the last several days she ate her meals in restaurants and has noticed trivial swelling around her ankles.  She presents for evaluation. ? ?Past Medical History:  ?Diagnosis Date  ? Acute perforated appendicitis 04/02/2019  ? Anemia   ? Arthritis   ? Depression   ? Diabetes mellitus without complication ([place])   ? Dysrhythmia   ? Over 10 years ago  ? Fatty liver   ? Gum disease   ? Heart murmur   ? History of syphilis   ? Hypertension   ? Irregular heart beat   ? Personal history of colonic polyps   ? Phlebitis   ? Pre-diabetes   ? ? ?Past Surgical History:  ?Procedure Laterality Date  ? CYSTECTOMY    ? between breasts  ? IR RADIOLOGIST EVAL &  MGMT  04/15/2019  ? IR RADIOLOGIST EVAL & MGMT  04/29/2019  ? IR RADIOLOGIST EVAL & MGMT  05/08/2019  ? LAPAROSCOPIC APPENDECTOMY N/A 06/30/2019  ? Procedure: APPENDECTOMY LAPAROSCOPIC;  Surgeon: Ralene Ok, MD;  Location: [facility];  Service: General;  Laterality: N/A;  ? SHOULDER ARTHROSCOPY Right 12/2015  ? SPINE SURGERY  08/20/2015  ? bulging / herniated disc in C-spine  ? THERAPEUTIC ABORTION  1970. 1974, 1978  ? ? ?Current Medications: ?Outpatient Medications Prior to Visit  ?Medication Sig Dispense Refill  ? acetaminophen (TYLENOL) 650 MG CR tablet Take 650 mg by mouth in the morning and at bedtime.    ? aspirin EC 81 MG tablet Take 81 mg by mouth daily with lunch.    ?  blood glucose meter kit and supplies KIT Dispense based on patient and insurance preference. Use up to four times daily as directed. (FOR ICD-9 250.00, 250.01). 1 each 0  ? calcium carbonate (OSCAL) 1500 (600 Ca) MG TABS tablet Take 1,200 mg by mouth daily with lunch. 800 units vitamin D    ? famotidine (PEPCID) 20 MG tablet TAKE 1 TABLET BY MOUTH AT  BEDTIME FOR BURPING (Patient taking differently: TAKE 1 TABLET BY MOUTH AT  BEDTIME  as needed FOR BURPING) 90 tablet 0  ? glucose blood (ONETOUCH VERIO) test strip Check blood sugars daily 100 each 12  ? hydrALAZINE (APRESOLINE) 25 MG tablet TAKE 1 TABLET BY MOUTH THREE TIMES A DAY 90 tablet 3  ? hydrochlorothiazide (HYDRODIURIL) 25 MG tablet Take 1 tablet (25 mg total) by mouth daily. 90 tablet 1  ? meloxicam (MOBIC) 7.5 MG tablet Take 1 tablet (7.5 mg total) by mouth daily. 90 tablet 1  ? metoprolol succinate (TOPROL-XL) 50 MG 24 hr tablet TAKE 1 AND 1/2 TABLETS BY  MOUTH DAILY TAKE WITH OR  IMMEDIATELY FOLLOWING A  MEAL 135 tablet 1  ? Multiple Vitamins-Minerals (MULTIVITAMIN WITH MINERALS) tablet Take 1 tablet by mouth daily.    ? ondansetron (ZOFRAN) 4 MG tablet Take 1 tablet (4 mg total) by mouth 2 (two) times daily as needed. 90 tablet 1  ? pantoprazole (PROTONIX) 40 MG tablet TAKE 1 TABLET BY MOUTH  DAILY 90 tablet 3  ? polyethylene glycol (MIRALAX / GLYCOLAX) 17 g packet Take 17 g by mouth daily as needed.    ? metoprolol tartrate (LOPRESSOR) 100 MG tablet Take 1 tablet (100 mg total) by mouth once for 1 dose. Take 2 hour prior to your procedure. 1 tablet 0  ? ?No facility-administered medications prior to visit.  ?  ? ?Allergies:   Aldactone [spironolactone] and Lisinopril  ? ?Social History  ? ?Socioeconomic History  ? Marital status: Divorced  ?  Spouse name: Not on file  ? Number of children: 1  ? Years of education: Not on file  ? Highest education level: Not on file  ?Occupational History  ? Not on file  ?Tobacco Use  ? Smoking status: Former  ?   Packs/day: 1.00  ?  Years: 40.00  ?  Pack years: 40.00  ?  Types: Cigarettes  ?  Quit date: 2011  ?  Years since quitting: 12.3  ? Smokeless tobacco: Never  ?Vaping Use  ? Vaping Use: Never used  ?Substance and Sexual Activity  ? Alcohol use: No  ? Drug use: No  ? Sexual activity: Not on file  ?Other Topics Concern  ? Not on file  ?Social History Narrative  ? Regular exercise:  No  ?  Caffeine Use:  No  ? 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 Divorced  ? Son Age 19- alive and well  ? 2 grand daughters- age 45 and an adopted grandaughter age 61  ? 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   ?   ?   ? ?Social Determinants of Health  ? ?Financial Resource Strain: Low Risk   ? Difficulty of Paying Living Expenses: Not hard at all  ?Food Insecurity: Not on file  ?Transportation Needs: Not on file  ?Physical Activity: Inactive  ? Days of Exercise per Week: 0 days  ? Minutes of Exercise per Session: 0 min  ?Stress: No Stress Concern Present  ? Feeling of Stress : Only a little  ?Social Connections: Moderately Integrated  ? Frequency of Communication with Friends and Family: More than three times a week  ? Frequency of Social Gatherings with Friends and Family: More than three times a week  ? Attends Religious Services: More than 4 times per year  ? Active Member of Clubs or Organizations: Yes  ? Attends Archivist Meetings: More than 4 times per year  ? Marital Status: Divorced  ?  ?Socially, she was born in [place].  She is divorced for 23 years.  She has 1 child and 3 grandchildren.  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  She has significant tobacco history smoking 1-1/2 packs/day for 42 years.  She quit tobacco in September 2011.  She does not drink alcohol.  She does not exercise. ? ?Family History:  The patient's family history  includes Cancer in her father; Colon cancer in her brother; Diabetes in her mother; Heart attack in her brother; Kidney disease in her father; Seizures in her father; Stroke in her brother.  ? ?Her mother is living at

## 2021-06-30 NOTE — Patient Instructions (Signed)
Medication Instructions:  ?The current medical regimen is effective;  continue present plan and medications. ? ?*If you need a refill on your cardiac medications before your next appointment, please call your pharmacy* ? ? ?Follow-Up: ?At [facility], you and your health needs are our priority.  As part of our continuing mission to provide you with exceptional heart care, we have created designated Provider Care Teams.  These Care Teams include your primary Cardiologist (physician) and Advanced Practice Providers (APPs -  Physician Assistants and Nurse Practitioners) who all work together to provide you with the care you need, when you need it. ? ?We recommend signing up for the patient portal called "MyChart".  Sign up information is provided on this After Visit Summary.  MyChart is used to connect with patients for Virtual Visits (Telemedicine).  Patients are able to view lab/test results, encounter notes, upcoming appointments, etc.  Non-urgent messages can be sent to your provider as well.   ?To learn more about what you can do with MyChart, go to [email or web address].   ? ?Your next appointment:   ?As needed ? ?The format for your next appointment:   ?In Person ? ?Provider:   ?Shelva Majestic, MD  ? ? ? ? ? ? ? ?

## 2021-07-04 DIAGNOSIS — H52223 Regular astigmatism, bilateral: Secondary | ICD-10-CM | POA: Diagnosis not present

## 2021-07-04 DIAGNOSIS — H2513 Age-related nuclear cataract, bilateral: Secondary | ICD-10-CM | POA: Diagnosis not present

## 2021-07-04 DIAGNOSIS — H524 Presbyopia: Secondary | ICD-10-CM | POA: Diagnosis not present

## 2021-07-04 DIAGNOSIS — H43823 Vitreomacular adhesion, bilateral: Secondary | ICD-10-CM | POA: Diagnosis not present

## 2021-07-05 ENCOUNTER — Ambulatory Visit (INDEPENDENT_AMBULATORY_CARE_PROVIDER_SITE_OTHER): Payer: Medicare Other | Admitting: Pharmacist

## 2021-07-05 DIAGNOSIS — E119 Type 2 diabetes mellitus without complications: Secondary | ICD-10-CM

## 2021-07-05 DIAGNOSIS — K219 Gastro-esophageal reflux disease without esophagitis: Secondary | ICD-10-CM

## 2021-07-05 DIAGNOSIS — I10 Essential (primary) hypertension: Secondary | ICD-10-CM

## 2021-07-05 LAB — HM DIABETES EYE EXAM

## 2021-07-05 NOTE — Patient Instructions (Signed)
Mrs. Padmore ?It was a pleasure speaking with you again today ?Below is a summary of your health goals and care plan ? ?Patient Goals/Self-Care Activities ?take medications as prescribed,  ?check glucose weekly, document, and provide at future appointments,  ?check blood pressure weekly, document, and provide at future appointments, and  ?target a minimum of 150 minutes of moderate intensity exercise weekly ?Continue to limit in take of food high in sugar and carbohydrates. Increase low carbohydrate vegetables like green beans, broccoli, cauliflower, zucchini, squash, greens, lettuce, cucumbers, asparagus, tomatoes ? ? ?If you have any questions or concerns, please feel free to contact me either at the phone number below or with a MyChart message.  ? ?Keep up the good work! ? ?[name], PharmD ?Clinical Pharmacist ?[facility] ?[place] ?[phone] (direct line)  ?[phone] (main office number) ? ? ? ?Chronic Care Management Care Plan - updated 07/05/2021 ? ?  ?Hypertension ?BP Readings from Last 3 Encounters:  ?05/04/20 130/65  ?04/19/20 130/60  ?01/27/20 130/60  ? ?Pharmacist Clinical Goal(s): ?Over the next 180 days, patient will work with PharmD and providers to maintain BP goal <140/90 ?Current regimen:  ?Hydralazine '25mg'$  3 times a day ?Metoprolol succinate '25mg'$  daily ?hydrochlorothiazide '25mg'$  daily ?Interventions: ?Requested patient to check blood pressure once per week and record ?Reviewed blood pressure goal ?Patient self care activities - Over the next 90 days, patient will: ?Check blood pressure weekly, document, and provide at future appointments ?Ensure daily salt intake < 2300 mg/day ? ?Diabetes ?Lab Results  ?Component Value Date/Time  ? HGBA1C 6.9 (H) 05/04/2020 11:49 AM  ? HGBA1C 6.6 (H) 11/05/2019 01:55 PM  ? ?Pharmacist Clinical Goal(s): ?Over the next 180 days, patient will work with PharmD and providers to maintain A1c goal <7% ?Current regimen:  ?Diet and  exercise management   ?Interventions: ?Reviewed diabetic diet  ?Limit sugar, bread, rice, pasta and potato intake ?Increase non starch vegetable and fruits ?Requested patient to check blood sugar 3 times per week and record ?Discussed increasing exercise ?Patient self care activities - Over the next 90 days, patient will: ?Check blood sugar weekly document, and provide at future appointments ?Increase exercise / physical activity. Start with 5 to 10 minutes per day of any activity you enjoy and increase as able to goal of at least 150 minutes per week. ?Reviewed home blood glucose readings and reviewed goals  ?Fasting blood glucose goal (before meals) = 80 to 130 ?Blood glucose goal after a meal = less than 180  ? ?Chronic Nausea ?Pharmacist Clinical Goal(s) ?Over the next 180 days, patient will work with PharmD and providers to reduce nausea symptoms ?Current regimen:  ?Pantoprazole '40mg'$  daily noon ?Famotidine '20mg'$  daily bedtime if needed ?Ondansetron as needed for nausea ?Patient self care activities - Over the next 180 days, patient will: ?Continue to follow up with gastroenterologist ? ?Pain ?Pharmacist Clinical Goal(s) ?Over the next 90 days, patient will work with PharmD and providers to reduce patient's symptoms of pain ?Current regimen:  ?Meloxicam 7.'5mg'$  daily ?Acetaminophen '650mg'$  twice a day ?Patient self care activities - Over the next 90 days, patient will: ?Maintain pain medication regimen ? ?Health Maintenance:  ?Discussed Shingrix vaccine -patient has not gotten due to cost. Recommend reassess cost in 2023 ? ?Medication management ?Pharmacist Clinical Goal(s): ?Over the next 90 days, patient will work with PharmD and providers to maintain optimal medication adherence ?Current pharmacy: [facility] ?Interventions ?Comprehensive medication review performed. ?Continue current medication management strategy ?Patient self care  activities - Over the next 90 days, patient will: ?Focus on medication  adherence by filling medications appropriately  ?Take medications as prescribed ?Report any questions or concerns to PharmD and/or provider(s) ? ? ? ?Follow Up Plan: No follow up scheduled at this time. Patient is aware she can contact Clinical Pharmacist Practitioner at her convenience regarding medication questions and concerns.  ? ?Patient verbalizes understanding of instructions and care plan provided today and agrees to view in [place]. Active MyChart status confirmed with patient.    ?

## 2021-07-05 NOTE — Chronic Care Management (AMB) (Signed)
? ? ?Chronic Care Management ?Pharmacy Note ? ?07/05/2021 ?Name:  Cassidy Harrell MRN:  [id number] DOB:  10/19/1950 ? ?Subjective: ?Cassidy Harrell is an 71 y.o. year old female who is a primary patient of [name], NP.  The CCM team was consulted for assistance with disease management and care coordination needs.   ? ?Engaged with patient by telephone for follow up visit in response to provider referral for pharmacy case management and/or care coordination services.  ? ?Consent to Services:  ?The patient was given information about Chronic Care Management services, agreed to services, and gave verbal consent prior to initiation of services.  Please see initial visit note for detailed documentation.  ? ?Patient Care Team: ?[name], NP as PCP - General (Internal Medicine) ?Karie Chimera, MD as Consulting Physician (Neurosurgery) ? ?Recent office visits: ?05/13/2021 - [facility]) F/U hypertension. No med changes. Refill hydralazine ?05/04/2020 - PCP Edwena Blow) - f/u chronic conditions; Stopped iron supplement due to constipation; Start MVI daily; Rx for Shingrix vaccine.  ? ? ?Hospital visits: ?None in previous 6 months ? ?Objective: ? ?Lab Results  ?Component Value Date  ? CREATININE 1.05 (H) 06/13/2021  ? CREATININE 0.94 05/13/2021  ? CREATININE 1.00 02/08/2021  ? ? ?Lab Results  ?Component Value Date  ? HGBA1C 6.7 (H) 05/13/2021  ? ?Last diabetic Eye exam:  ?Lab Results  ?Component Value Date/Time  ? HMDIABEYEEXA No Retinopathy 01/13/2020 12:00 AM  ?  ?Last diabetic Foot exam: No results found for: HMDIABFOOTEX  ? ?   ?Component Value Date/Time  ? CHOL 86 05/13/2021 1502  ? TRIG 191 (H) 05/13/2021 1502  ? HDL 49 (L) 05/13/2021 1502  ? CHOLHDL 1.8 05/13/2021 1502  ? VLDL 18.8 10/03/2016 1319  ? [place] 11 05/13/2021 1502  ? ? ? ?  Latest Ref Rng & Units 05/13/2021  ?  3:02 PM 05/04/2020  ? 11:49 AM 11/05/2019  ?  1:55 PM  ?Hepatic Function  ?Total Protein 6.1 - 8.1 g/dL 7.2   7.1   7.0     ?Albumin 3.5 - 5.2 g/dL  4.2     ?AST 10 - 35 U/L _0 ?ALT 6 - 29 U/L _1 ?Alk Phosphatase 39 - 117 U/L  59     ?Total Bilirubin 0.2 - 1.2 mg/dL 0.3   0.3   0.3    ? ? ?Lab Results  ?Component Value Date/Time  ? TSH 3.08 02/08/2021 01:27 PM  ? TSH 3.74 11/05/2018 02:36 PM  ? FREET4 0.77 02/08/2021 01:27 PM  ? FREET4 1.14 11/15/2007 09:02 PM  ? ? ? ?  Latest Ref Rng & Units 05/04/2020  ? 11:49 AM 11/05/2019  ?  1:55 PM 06/25/2019  ?  1:36 PM  ?CBC  ?WBC 4.0 - 10.5 K/uL 5.0   5.9   5.2    ?Hemoglobin 12.0 - 15.0 g/dL 12.2   12.2   13.2    ?Hematocrit 36.0 - 46.0 % 36.8   36.8   42.2    ?Platelets 150.0 - 400.0 K/uL 189.0   204   220    ? ? ?No results found for: VD25OH ? ?Clinical ASCVD: No  ?The ASCVD Risk score (Arnett DK, et al., 2019) failed to calculate for the following reasons: ?  The valid total cholesterol range is 130 to 320 mg/dL   ? ?Other:  ?Dexa 09/14/2020: ?AP Spine L1-L4 (L3)  09/14/2020 69.5 Normal 2.6 1.487 g/cm2 ?AP Spine L1-L4 (L3) 06/01/2015 64.2 Normal 3.4 1.574 g/cm2 ?  ?DualFemur Neck Right 09/14/2020 69.5 Normal -0.7 0.943 g/cm2 ?DualFemur Neck Right 06/01/2015 64.2 Normal -0.8 0.923 g/cm2 ?  ?DualFemur Total Mean 09/14/2020 69.5 Normal -0.1 0.996 g/cm2 ?DualFemur Total Mean 06/01/2015 64.2 Normal -0.3 0.970 g/cm2 ?  ?Left Forearm Radius 33% 09/14/2020 69.5 Normal 0.3 0.905 g/cm2 ?Left Forearm Radius 33% 06/01/2015 64.2 Normal 0.2 0.890 g/cm2 ? ?Social History  ? ?Tobacco Use  ?Smoking Status Former  ? Packs/day: 1.00  ? Years: 40.00  ? Pack years: 40.00  ? Types: Cigarettes  ? Quit date: 2011  ? Years since quitting: 12.3  ?Smokeless Tobacco Never  ? ?BP Readings from Last 3 Encounters:  ?06/30/21 130/60  ?06/21/21 (!) 141/65  ?06/01/21 120/62  ? ?Pulse Readings from Last 3 Encounters:  ?06/30/21 78  ?06/21/21 66  ?06/01/21 78  ? ?Wt Readings from Last 3 Encounters:  ?06/30/21 191 lb (86.6 kg)  ?06/01/21 198 lb 9.6 oz (90.1 kg)  ?05/23/21 186 lb 6 oz (84.5 kg)   ? ? ?Assessment: Review of patient past medical history, allergies, medications, health status, including review of consultants reports, laboratory and other test data, was performed as part of comprehensive evaluation and provision of chronic care management services.  ? ?SDOH:  (Social Determinants of Health) assessments and interventions performed:  ?SDOH Interventions   ? ?Flowsheet Row Most Recent Value  ?SDOH Interventions   ?Financial Strain Interventions Intervention Not Indicated  ?Physical Activity Interventions Patient Refused  ? ?  ? ? ?[place] ? ?Allergies  ?Allergen Reactions  ? Aldactone [Spironolactone] Other (See Comments)  ?  itching  ? Lisinopril   ?  Throat itching / cough  ? ? ?Medications Reviewed Today   ? ? Reviewed by [name], RPH-CPP (Pharmacist) on 07/05/21 at 1034  Med List Status: <None>  ? ?Medication Order Taking? Sig Documenting Provider Last Dose Status Informant  ?acetaminophen (TYLENOL) 650 MG CR tablet [id number] Yes Take 650 mg by mouth in the morning and at bedtime. [provider] Taking Active Self  ?aspirin EC 81 MG tablet [id number] Yes Take 81 mg by mouth daily with lunch. [provider] Taking Active Self  ?blood glucose meter kit and supplies KIT [id number] Yes Dispense based on patient and insurance preference. Use up to four times daily as directed. (FOR ICD-9 250.00, 250.01). [name], NP Taking Active Self  ?calcium carbonate (OSCAL) 1500 (600 Ca) MG TABS tablet [id number] Yes Take 1,200 mg by mouth daily with lunch. 800 units vitamin D [provider] Taking Active Self  ?famotidine (PEPCID) 20 MG tablet [id number]  TAKE 1 TABLET BY MOUTH AT  BEDTIME FOR BURPING  ?Patient taking differently: TAKE 1 TABLET BY MOUTH AT  BEDTIME  as needed FOR BURPING  ? Milus Banister, MD  Active   ?glucose blood [facility] VERIO) test strip [id number] Yes Check blood sugars daily [name], NP Taking Active   ?hydrALAZINE  (APRESOLINE) 25 MG tablet [id number] Yes TAKE 1 TABLET BY MOUTH THREE TIMES A Charlton Haws, NP Taking Active   ?hydrochlorothiazide (HYDRODIURIL) 25 MG tablet [id number] Yes Take 1 tablet (25 mg total) by mouth daily. [name], NP Taking Active   ?meloxicam (MOBIC) 7.5 MG tablet [id number] Yes Take 1 tablet (7.5 mg total) by mouth daily. [name], NP Taking Active   ?metoprolol succinate (TOPROL-XL) 50 MG 24 hr tablet [id number] Yes TAKE 1 AND 1/2 TABLETS  BY  MOUTH DAILY TAKE WITH OR  IMMEDIATELY FOLLOWING A  MEAL [name], NP Taking Active   ?Multiple Vitamins-Minerals (MULTIVITAMIN WITH MINERALS) tablet [id number] Yes Take 1 tablet by mouth daily. [name], NP Taking Active   ?ondansetron (ZOFRAN) 4 MG tablet [id number] Yes Take 1 tablet (4 mg total) by mouth 2 (two) times daily as needed. [name], NP Taking Active   ?pantoprazole (PROTONIX) 40 MG tablet [id number] Yes TAKE 1 TABLET BY MOUTH  DAILY [name], NP Taking Active   ?polyethylene glycol (MIRALAX / GLYCOLAX) 17 g packet [id number] Yes Take 17 g by mouth daily as needed. [provider] Taking Active   ? ?  ?  ? ?  ? ? ?Patient Active Problem List  ? Diagnosis Date Noted  ? Cough 02/08/2021  ? GERD (gastroesophageal reflux disease) 02/08/2021  ? Fatty liver   ? Palpitations 06/30/2015  ? Hypokalemia 06/07/2015  ? Myalgia and myositis 05/28/2014  ? Allergic rhinitis 05/28/2014  ? Thyromegaly 11/24/2013  ? Foot pain 11/24/2013  ? Anemia, iron deficiency 08/25/2013  ? Diabetes type 2, controlled ([place]) 08/25/2013  ? Other malaise and fatigue 10/14/2012  ? Cervical disc disease 06/06/2012  ? Routine general medical examination at a health care facility 11/13/2011  ? Hypothyroid 10/24/2011  ? Arthritis 09/25/2011  ? Depression 09/25/2011  ? History of syphilis 09/25/2011  ? HTN (hypertension) 09/25/2011  ? ? ?Immunization History  ?Administered Date(s) Administered  ? Fluad Quad(high  Dose 65+) 11/05/2018  ? Influenza Split 11/13/2011  ? Influenza, High Dose Seasonal PF 03/10/2016, 01/02/2017, 02/08/2018  ? Influenza,inj,Quad PF,6+ Mos 11/24/2013  ? Influenza-Unspecified 11/27/2012, 12/03/2019, 12/01/18

## 2021-07-09 ENCOUNTER — Encounter: Payer: Self-pay | Admitting: Cardiovascular Disease

## 2021-07-12 ENCOUNTER — Other Ambulatory Visit: Payer: Self-pay | Admitting: Family

## 2021-07-27 DIAGNOSIS — I10 Essential (primary) hypertension: Secondary | ICD-10-CM

## 2021-07-27 DIAGNOSIS — E119 Type 2 diabetes mellitus without complications: Secondary | ICD-10-CM | POA: Diagnosis not present

## 2021-07-27 DIAGNOSIS — Z87891 Personal history of nicotine dependence: Secondary | ICD-10-CM

## 2021-08-08 ENCOUNTER — Other Ambulatory Visit: Payer: Self-pay | Admitting: Family

## 2021-08-10 ENCOUNTER — Other Ambulatory Visit: Payer: Self-pay | Admitting: Family

## 2021-08-12 ENCOUNTER — Telehealth: Payer: Self-pay | Admitting: Pharmacist

## 2021-08-12 NOTE — Telephone Encounter (Signed)
Opened in error

## 2021-08-15 ENCOUNTER — Other Ambulatory Visit: Payer: Self-pay | Admitting: Family

## 2021-08-16 ENCOUNTER — Ambulatory Visit: Payer: Medicare Other | Admitting: Family

## 2021-08-23 ENCOUNTER — Ambulatory Visit: Payer: Medicare Other

## 2021-08-23 ENCOUNTER — Ambulatory Visit: Payer: Medicare Other | Admitting: Family

## 2021-08-30 ENCOUNTER — Other Ambulatory Visit: Payer: Self-pay | Admitting: Gastroenterology

## 2021-08-31 MED ORDER — FAMOTIDINE 20 MG PO TABS: ORAL_TABLET | ORAL | 2 refills | Status: DC

## 2021-09-06 ENCOUNTER — Ambulatory Visit (INDEPENDENT_AMBULATORY_CARE_PROVIDER_SITE_OTHER): Payer: Medicare Other | Admitting: Family

## 2021-09-06 ENCOUNTER — Encounter: Payer: Self-pay | Admitting: Family

## 2021-09-06 VITALS — BP 132/60 | HR 74 | Temp 98.0°F | Resp 18 | Ht 62.0 in | Wt 188.2 lb

## 2021-09-06 DIAGNOSIS — Z8639 Personal history of other endocrine, nutritional and metabolic disease: Secondary | ICD-10-CM | POA: Diagnosis not present

## 2021-09-06 DIAGNOSIS — R0789 Other chest pain: Secondary | ICD-10-CM | POA: Insufficient documentation

## 2021-09-06 DIAGNOSIS — E119 Type 2 diabetes mellitus without complications: Secondary | ICD-10-CM

## 2021-09-06 DIAGNOSIS — Z23 Encounter for immunization: Secondary | ICD-10-CM | POA: Diagnosis not present

## 2021-09-06 DIAGNOSIS — I10 Essential (primary) hypertension: Secondary | ICD-10-CM | POA: Diagnosis not present

## 2021-09-06 DIAGNOSIS — K219 Gastro-esophageal reflux disease without esophagitis: Secondary | ICD-10-CM

## 2021-09-06 MED ORDER — HYDRALAZINE HCL 25 MG PO TABS: 25.0000 mg | ORAL_TABLET | Freq: Three times a day (TID) | ORAL | 1 refills | Status: DC

## 2021-09-06 NOTE — Progress Notes (Signed)
Subjective:   By signing my name below, I, Kellie Simmering, attest that this documentation has been prepared under the direction and in the presence of [name], NP 09/06/2021.   Patient ID: Cassidy Harrell, female    DOB: [birth year], 71 y.o.   MRN: [id number]  Chief Complaint  Patient presents with   Diabetes    HPI Patient is in today for an office visit.  Chest discomfort- She reports that she had some brief right sided chest discomfort yesterday after learning that her mom has pancreatic cancer.   Family history- Her mother has recently been diagnosed with pancreatic cancer.   Blood pressure- Her blood pressure is within normal ranges today. She is currently taking metoprolol succinate 50 mg and hydralazine 32m tid to manage her blood pressure. BP Readings from Last 3 Encounters:  09/06/21 132/60  06/30/21 130/60  06/21/21 (!) 141/65   Pulse Readings from Last 3 Encounters:  09/06/21 74  06/30/21 78  06/21/21 66   GERD- She reports that she is dealing with GERD and she does not deal with dairy foods well, as they cause diarrhea.   Blood glucose- Her blood glucose is slightly elevated today.  Lab Results  Component Value Date   HGBA1C 6.7 (H) 05/13/2021   Immunizations- She is up to date with her COVID-19 vaccinations. She is holding off on Shingrix vaccination due to a high copay at her pharmacy.   Past Medical History:  Diagnosis Date   Acute perforated appendicitis 04/02/2019   Anemia    Arthritis    Depression    Diabetes mellitus without complication (HCC)    Dysrhythmia    Over 10 years ago   Fatty liver    Gum disease    Heart murmur    History of syphilis    Hypertension    Irregular heart beat    Personal history of colonic polyps    Phlebitis    Pre-diabetes     Past Surgical History:  Procedure Laterality Date   CYSTECTOMY     between breasts   IR RADIOLOGIST EVAL & MGMT  04/15/2019   IR RADIOLOGIST EVAL & MGMT  04/29/2019   IR  RADIOLOGIST EVAL & MGMT  05/08/2019   LAPAROSCOPIC APPENDECTOMY N/A 06/30/2019   Procedure: APPENDECTOMY LAPAROSCOPIC;  Surgeon: RRalene Ok MD;  Location[facility]  Service: General;  Laterality: N/A;   SHOULDER ARTHROSCOPY Right 12/2015   SPINE SURGERY  08/20/2015   bulging / herniated disc in C-spine   THERAPEUTIC [facility] 1978    Family History  Problem Relation Age of Onset   Diabetes Mother    Cancer Father        lung cancer   Kidney disease Father    Seizures Father    Colon cancer Brother    Heart attack Brother    Stroke Brother    Stomach cancer Neg Hx    Esophageal cancer Neg Hx    Rectal cancer Neg Hx     Social History   Socioeconomic History   Marital status: Divorced    Spouse name: Not on file   Number of children: 1   Years of education: Not on file   Highest education level: Not on file  Occupational History   Not on file  Tobacco Use   Smoking status: Former    Packs/day: 1.00    Years: 40.00    Total pack years: 40.00    Types: Cigarettes  Quit date: 2011    Years since quitting: 12.5   Smokeless tobacco: Never  Vaping Use   Vaping Use: Never used  Substance and Sexual Activity   Alcohol use: No   Drug use: No   Sexual activity: Not on file  Other Topics Concern   Not on file  Social History Narrative   Regular exercise:  No   Caffeine Use:  No   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    Divorced   Son Age 52- alive and well   2 grand daughters- age 19 and an adopted grandaughter age 66   [place] reading, walking spending time with family, 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[employer]            Social Determinants of Health   Financial Resource Strain: [place]  (07/05/2021)   Overall Financial Resource Strain (CARDIA)    Difficulty of Paying Living Expenses: Not hard at all  Food Insecurity: No Food Insecurity (08/18/2019)   Hunger Vital Sign    Worried  About Running Out of Food in the Last Year: Never true    [place] in the Last Year: Never true  Transportation Needs: No Transportation Needs (08/18/2019)   PRAPARE - Hydrologist (Medical): No    Lack of Transportation (Non-Medical): No  Physical Activity: Inactive (07/05/2021)   Exercise Vital Sign    Days of Exercise per Week: 0 days    Minutes of Exercise per Session: 0 min  Stress: No Stress Concern Present (08/18/2020)   [place]    Feeling of Stress : Only a little  Social Connections: Moderately Integrated (08/18/2020)   Social Connection and Isolation Panel [NHANES]    Frequency of Communication with Friends and Family: More than three times a week    Frequency of Social Gatherings with Friends and Family: More than three times a week    Attends Religious Services: More than 4 times per year    Active Member of [employer] or Organizations: Yes    Attends Music therapist: More than 4 times per year    Marital Status: Divorced  Intimate Partner Violence: Not At Risk (08/18/2020)   Humiliation, Afraid, Rape, and Kick questionnaire    Fear of Current or Ex-Partner: No    Emotionally Abused: No    Physically Abused: No    Sexually Abused: No    Outpatient Medications Prior to Visit  Medication Sig Dispense Refill   acetaminophen (TYLENOL) 650 MG CR tablet Take 650 mg by mouth in the morning and at bedtime.     aspirin EC 81 MG tablet Take 81 mg by mouth daily with lunch.     blood glucose meter kit and supplies KIT Dispense based on patient and insurance preference. Use up to four times daily as directed. (FOR ICD-9 250.00, 250.01). 1 each 0   calcium carbonate (OSCAL) 1500 (600 Ca) MG TABS tablet Take 1,200 mg by mouth daily with lunch. 800 units vitamin D     famotidine (PEPCID) 20 MG tablet TAKE 1 TABLET BY MOUTH AT  BEDTIME FOR BURPING 90 tablet 2   glucose blood  (ONETOUCH VERIO) test strip Check blood sugars daily 100 each 12   hydrochlorothiazide (HYDRODIURIL) 25 MG tablet TAKE 1 TABLET BY MOUTH DAILY 90 tablet 3   meloxicam (MOBIC) 7.5 MG tablet TAKE 1 TABLET BY MOUTH  DAILY 90 tablet 3   metoprolol succinate (TOPROL-XL) 50 MG 24 hr tablet TAKE 1 AND 1/2 TABLETS BY MOUTH  DAILY TAKE WITH OR IMMEDIATELY  FOLLOWING A MEAL 135 tablet 1   Multiple Vitamins-Minerals (MULTIVITAMIN WITH MINERALS) tablet Take 1 tablet by mouth daily.     ondansetron (ZOFRAN) 4 MG tablet TAKE 1 TABLET BY MOUTH TWICE  DAILY AS NEEDED 180 tablet 0   pantoprazole (PROTONIX) 40 MG tablet TAKE 1 TABLET BY MOUTH  DAILY 90 tablet 3   polyethylene glycol (MIRALAX / GLYCOLAX) 17 g packet Take 17 g by mouth daily as needed.     hydrALAZINE (APRESOLINE) 25 MG tablet TAKE 1 TABLET BY MOUTH THREE TIMES A DAY 90 tablet 3   No facility-administered medications prior to visit.    Allergies  Allergen Reactions   Aldactone [Spironolactone] Other (See Comments)    itching   Lisinopril     Throat itching / cough    ROS See HPI    Objective:    Physical Exam Constitutional:      Appearance: Normal appearance. She is not ill-appearing.  HENT:     Head: Normocephalic and atraumatic.     Right Ear: External ear normal.     Left Ear: External ear normal.  Eyes:     Extraocular Movements: Extraocular movements intact.     Pupils: Pupils are equal, round, and reactive to light.  Cardiovascular:     Rate and Rhythm: Normal rate and regular rhythm.     Pulses: Normal pulses.     Heart sounds: Normal heart sounds. No murmur heard.    No gallop.  Pulmonary:     Effort: Pulmonary effort is normal. No respiratory distress.     Breath sounds: Normal breath sounds. No wheezing or rales.  Feet:     Right foot:     Skin integrity: Skin integrity normal.     Toenail Condition: Right toenails are normal.     Left foot:     Skin integrity: Skin integrity normal.     Toenail Condition:  Left toenails are normal.     Comments: Normal foot sensitivity Lymphadenopathy:     Cervical: No cervical adenopathy.  Skin:    General: Skin is warm and dry.  Neurological:     Mental Status: She is alert and oriented to person, place, and time.  Psychiatric:        Mood and Affect: Mood normal.        Behavior: Behavior normal.        Judgment: Judgment normal.     BP 132/60 (BP Location: Left Arm, Patient Position: Sitting, Cuff Size: Large)   Pulse 74   Temp 98 F (36.7 C) (Oral)   Resp 18   Ht '5\' 2"'  (1.575 m)   Wt 188 lb 3.2 oz (85.4 kg)   SpO2 96%   BMI 34.42 kg/m  Wt Readings from Last 3 Encounters:  09/06/21 188 lb 3.2 oz (85.4 kg)  06/30/21 191 lb (86.6 kg)  06/01/21 198 lb 9.6 oz (90.1 kg)    Diabetic Foot Exam - Simple   Simple Foot Form Diabetic Foot exam was performed with the following findings: Yes 09/06/2021  1:36 PM  Visual Inspection No deformities, no ulcerations, no other skin breakdown bilaterally: Yes Sensation Testing Intact to touch and monofilament testing bilaterally: Yes Pulse Check Posterior Tibialis and Dorsalis pulse intact bilaterally: Yes Comments       Assessment & Plan:   Problem  List Items Addressed This Visit       Unprioritized   HTN (hypertension)    BP stable on hydralazine, hctz, toprol xl.        Relevant Medications   hydrALAZINE (APRESOLINE) 25 MG tablet   History of hypothyroidism    Lab Results  Component Value Date   TSH 3.08 02/08/2021  Has been stable over the last several years. She is not on synthroid.       GERD (gastroesophageal reflux disease) - Primary    Stable with protonix.        Diabetes type 2, controlled ([place])    Lab Results  Component Value Date   HGBA1C 6.7 (H) 05/13/2021   HGBA1C 6.9 (H) 05/04/2020   HGBA1C 6.6 (H) 11/05/2019   Lab Results  Component Value Date   MICROALBUR 0.7 09/16/2014   LDLCALC 11 05/13/2021   CREATININE 1.05 (H) 06/13/2021  Last A1C is at goal.  Continue  diabetic diet/exercise/weight loss efforts.  Repeat A1C today.        Relevant Orders   Basic metabolic panel   Hemoglobin A1c   Atypical chest pain    No chest pain today. Very atypical presentation in the setting of extreme emotion.  Monitor. She had a cardiac CT scan 4/23 with a score of zero.  Lipids are outstanding.  Lab Results  Component Value Date   CHOL 86 05/13/2021   HDL 49 (L) 05/13/2021   LDLCALC 11 05/13/2021   TRIG 191 (H) 05/13/2021   CHOLHDL 1.8 05/13/2021         Meds ordered this encounter  Medications   hydrALAZINE (APRESOLINE) 25 MG tablet    Sig: Take 1 tablet (25 mg total) by mouth 3 (three) times daily.    Dispense:  270 tablet    Refill:  1    Order Specific Question:   Supervising Provider    Answer:   Penni Homans A [[id number]]    I, [name], NP, personally preformed the services described in this documentation.  All medical record entries made by the scribe were at my direction and in my presence.  I have reviewed the chart and discharge instructions (if applicable) and agree that the record reflects my personal performance and is accurate and complete. 09/06/2021.   I,Mohammed Iqbal,acting as a Education administrator for [employer], NP.,have documented all relevant documentation on the behalf of [name], NP,as directed by  [name], NP while in the presence of [name], NP.      [name], NP

## 2021-09-06 NOTE — Assessment & Plan Note (Addendum)
Lab Results  Component Value Date   HGBA1C 6.7 (H) 05/13/2021   HGBA1C 6.9 (H) 05/04/2020   HGBA1C 6.6 (H) 11/05/2019   Lab Results  Component Value Date   MICROALBUR 0.7 09/16/2014   LDLCALC 11 05/13/2021   CREATININE 1.05 (H) 06/13/2021   Last A1C is at goal.  Continue diabetic diet/exercise/weight loss efforts.  Repeat A1C today.

## 2021-09-06 NOTE — Assessment & Plan Note (Addendum)
Lab Results  Component Value Date   TSH 3.08 02/08/2021   Has been stable over the last several years. She is not on synthroid.

## 2021-09-06 NOTE — Assessment & Plan Note (Addendum)
No chest pain today. Very atypical presentation in the setting of extreme emotion.  Monitor. She had a cardiac CT scan 4/23 with a score of zero.  Lipids are outstanding.  Lab Results  Component Value Date   CHOL 86 05/13/2021   HDL 49 (L) 05/13/2021   LDLCALC 11 05/13/2021   TRIG 191 (H) 05/13/2021   CHOLHDL 1.8 05/13/2021

## 2021-09-06 NOTE — Assessment & Plan Note (Signed)
Stable with protonix.

## 2021-09-06 NOTE — Assessment & Plan Note (Signed)
BP stable on hydralazine, hctz, toprol xl.

## 2021-09-07 LAB — HEMOGLOBIN A1C: Hgb A1c MFr Bld: 6.8 % — ABNORMAL HIGH (ref 4.6–6.5)

## 2021-09-07 LAB — BASIC METABOLIC PANEL
BUN: 16 mg/dL (ref 6–23)
CO2: 30 mEq/L (ref 19–32)
Calcium: 9.5 mg/dL (ref 8.4–10.5)
Chloride: 103 mEq/L (ref 96–112)
Creatinine, Ser: 1 mg/dL (ref 0.40–1.20)
GFR: 57.07 mL/min — ABNORMAL LOW (ref >60.00)
Glucose, Bld: 98 mg/dL (ref 70–99)
Potassium: 3.6 mEq/L (ref 3.5–5.1)
Sodium: 141 mEq/L (ref 135–145)

## 2021-09-15 ENCOUNTER — Ambulatory Visit: Payer: Medicare Other

## 2021-10-20 IMAGING — CT CT ABD-PELV W/ CM
2 of 4 series · 13 of 46 positions shown, 15 images · IV contrast (iopamidol)
Comparison: 04/01/2019

CLINICAL DATA: 68-year-old with history of ruptured appendicitis
and periappendiceal abscess. Percutaneous drain placement on
04/02/2019.

EXAM:
CT ABDOMEN AND PELVIS WITH CONTRAST
TECHNIQUE: Multidetector CT imaging of the abdomen and pelvis was performed
using the standard protocol following bolus administration of
intravenous contrast.
CONTRAST:  100mL 6NRDQ9-QUU IOPAMIDOL (6NRDQ9-QUU) INJECTION 61%

[Series 2: abd pelvis 5.00 br40 s3 axial · axial · 0.65mm/px · z∈[+1192,+1547]mm · 10 of 87 slices shown, 12 images]
[im 8/87  soft-tissue]
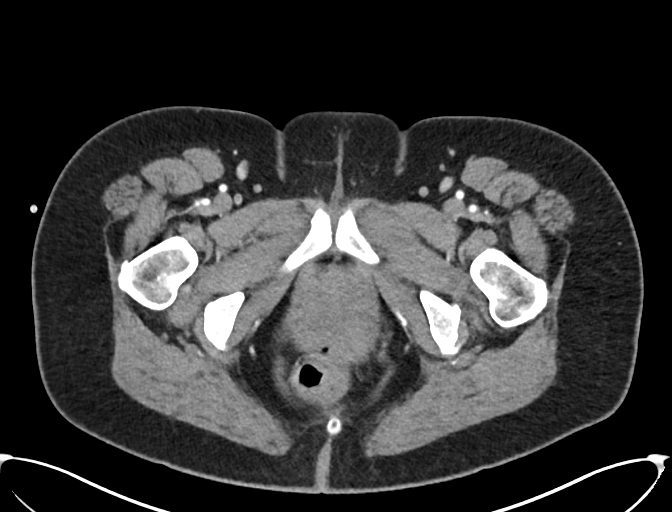
[im 8/87  bone]
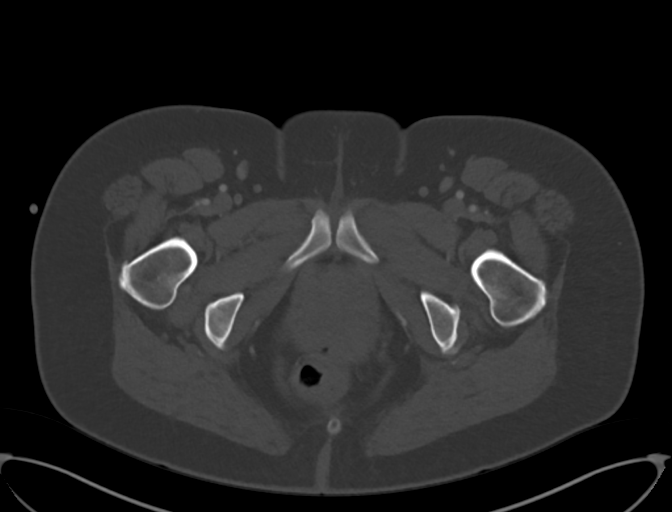
[im 15/87  soft-tissue]
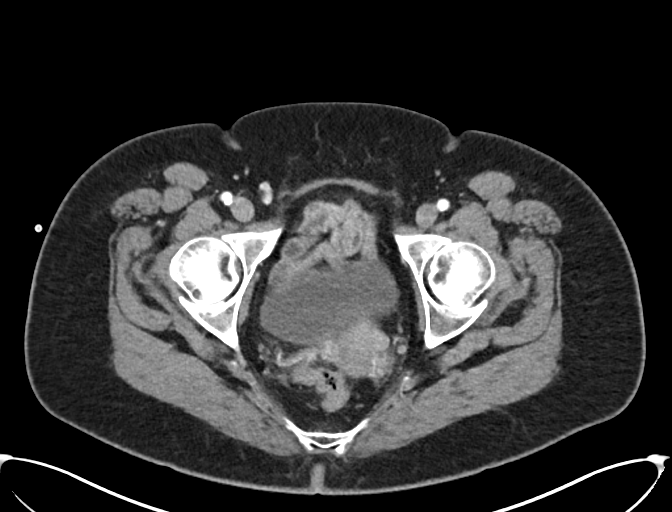
[im 22/87  soft-tissue]
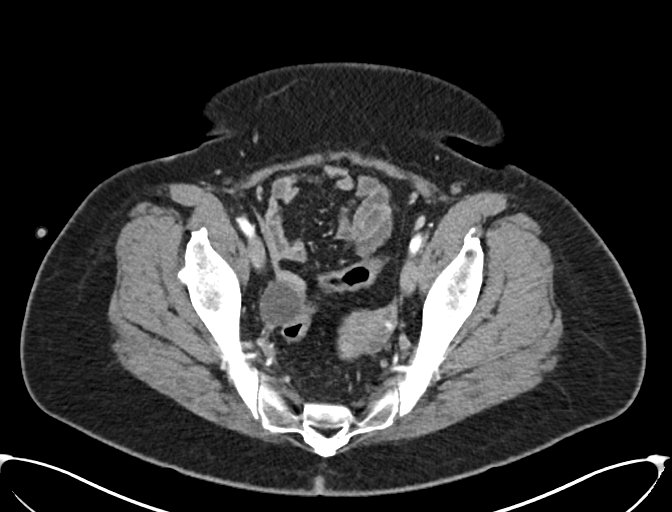
[im 33/87  soft-tissue]
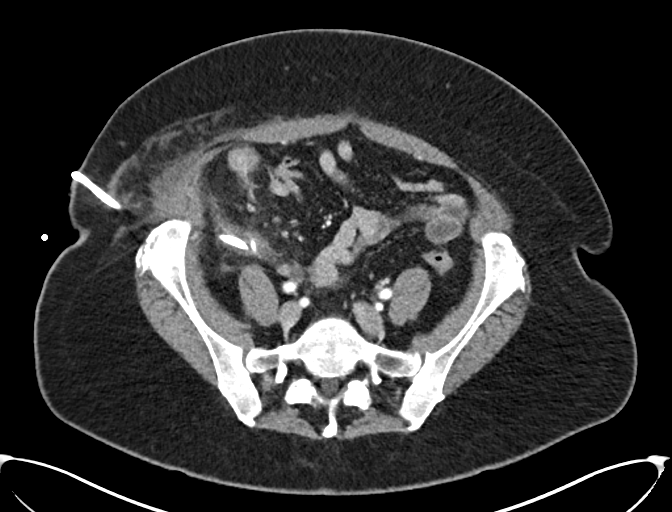
[im 40/87  soft-tissue]
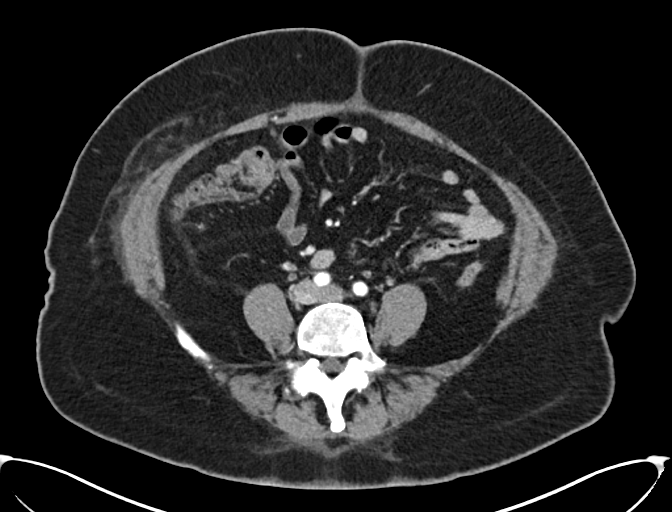
[im 47/87  soft-tissue]
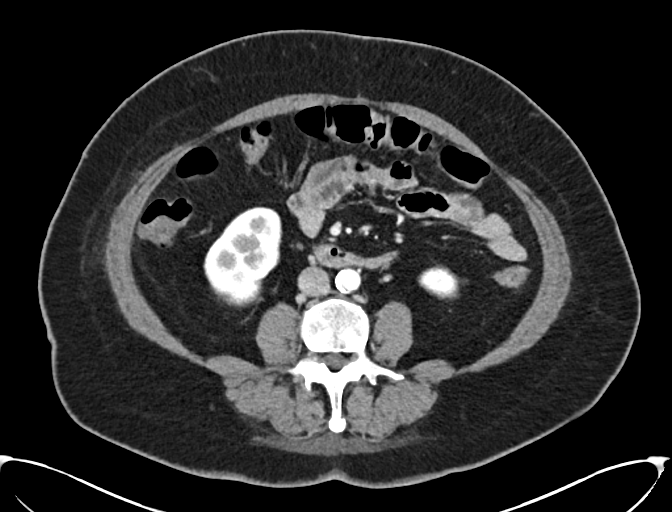
[im 54/87  soft-tissue]
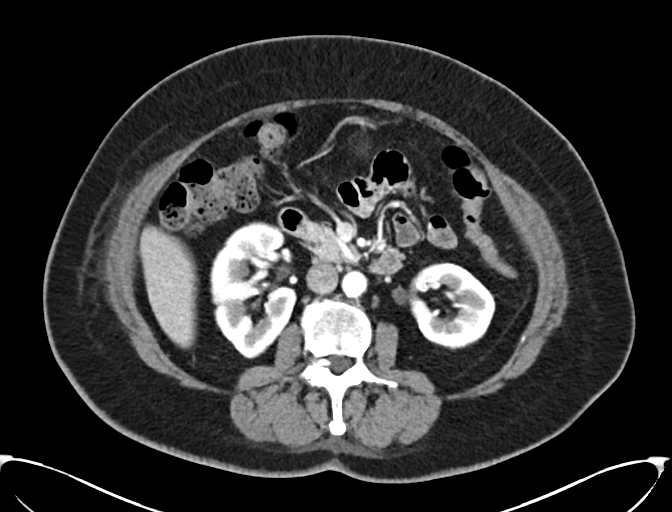
[im 65/87  soft-tissue]
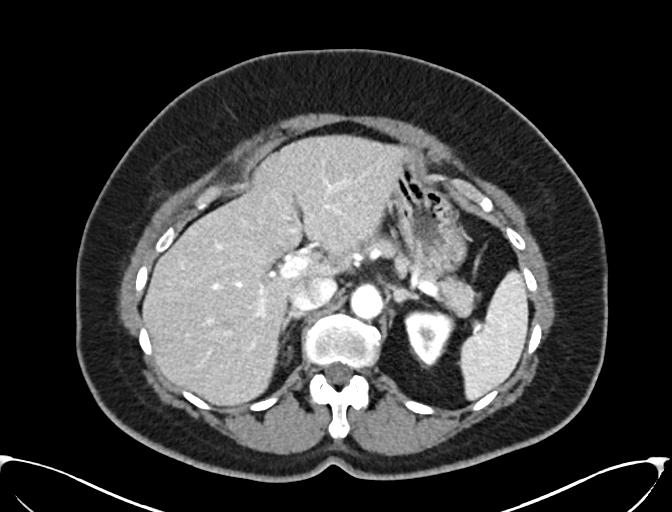
[im 72/87  soft-tissue]
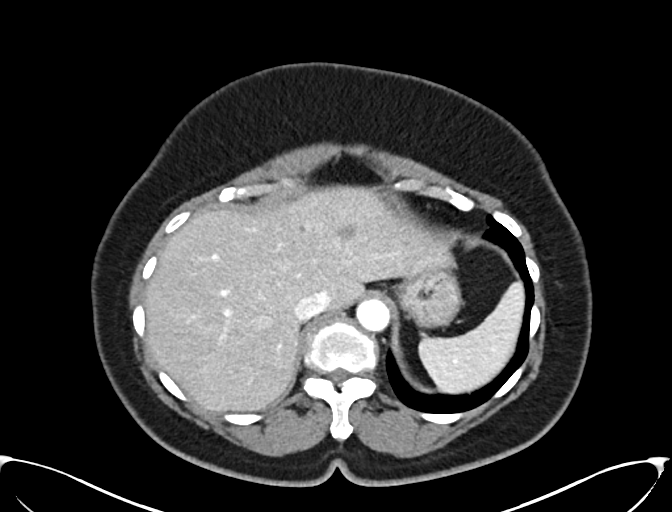
[im 72/87  bone]
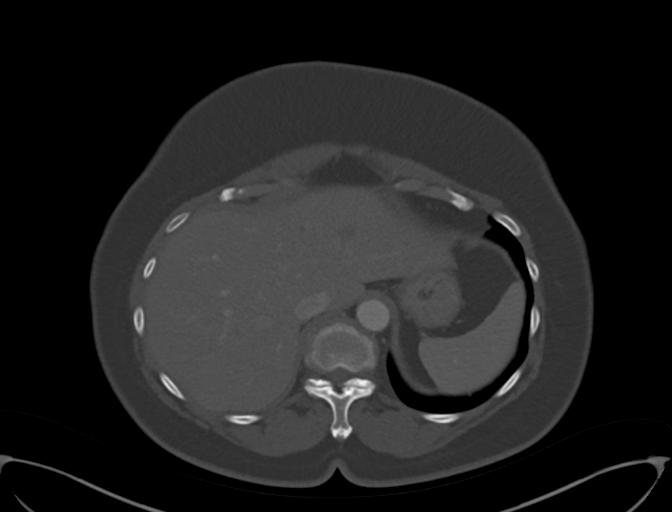
[im 79/87  soft-tissue]
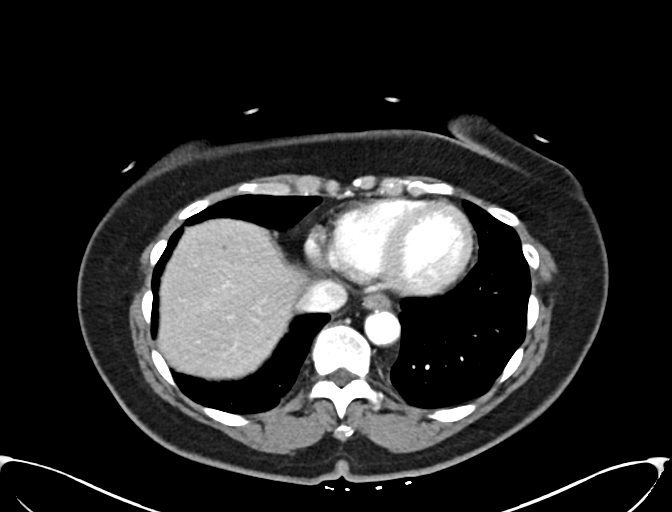

[Series 6: abd pelvis 2.00 br40 s3 cor · coronal · 0.81mm/px · 3 of 154 slices shown]
[im 52/154  soft-tissue]
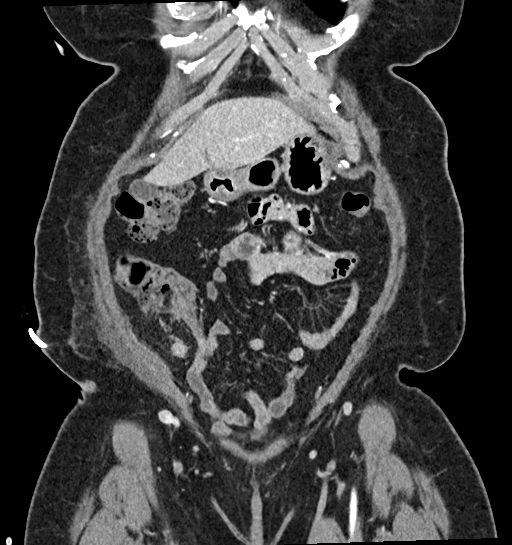
[im 69/154  soft-tissue]
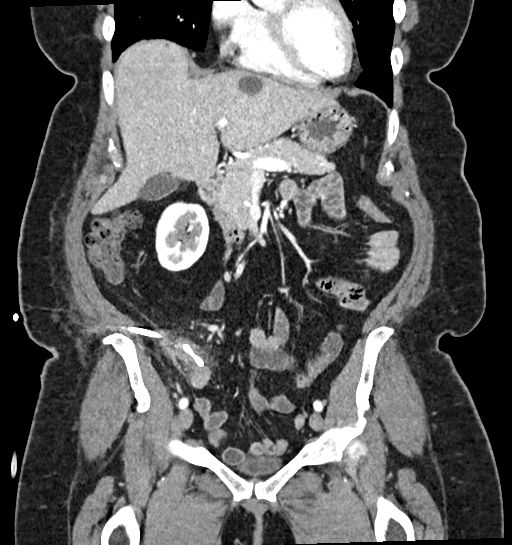
[im 86/154  soft-tissue]
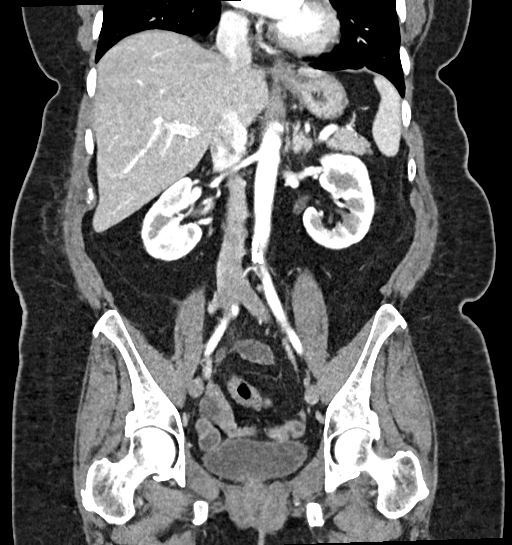

[13 of 46 positions shown; findings below may reference images not displayed]

FINDINGS: Lower chest: Lung bases remain clear. Probable scarring along the
posterior right lower lobe.

Hepatobiliary: Again noted are numerous hypodensities in liver that
are suggestive for hepatic cysts. Index lesion in left hepatic lobe
measures 2.1 cm and stable. Normal appearance of the gallbladder.
Portal venous system is patent. No biliary dilatation.

Pancreas: Unremarkable. No pancreatic ductal dilatation or
surrounding inflammatory changes.

Spleen: Normal in size without focal abnormality.

Adrenals/Urinary Tract: Normal appearance of the adrenal glands.
Normal appearance of the urinary bladder. Negative for
hydronephrosis. No suspicious renal lesions.

Stomach/Bowel: The periappendiceal abscess has essentially resolved
with the percutaneous drain. Percutaneous drain is located at the
previous abscess site. However, the tip of the appendix appears to
be in close proximity to the pigtail portion of the drain. The
appendix is enlarged and slightly irregular measuring up to 1.3 cm.
Normal appearance of the terminal ileum and ileocecal valve. No
evidence for bowel obstruction.

Vascular/Lymphatic: Mild atherosclerotic disease in the abdominal
aorta without aneurysm. Main visceral arteries are patent. No
significant lymph node enlargement in the abdomen or pelvis.

Reproductive: Again noted are low-density structures associated with
bilateral ovaries. Left ovarian low-density structure measures
cm and right ovarian structure measures 3.1 cm. Evidence for a
posterior uterine fibroid.

Other: Decreased inflammatory changes in the right lower quadrant.
Periappendiceal abscess has essentially resolved. Negative for free
fluid. Negative for free air or extraluminal gas.

Musculoskeletal: No acute bone abnormality.
IMPRESSION: 1. Periappendiceal abscess has resolved since placement of the
percutaneous drain. Percutaneous drain remains in the right lower
quadrant adjacent to the appendix. Decreased inflammatory changes
associated with the appendix.
2. Appendix continues to be prominent for size and compatible with
underlying inflammatory changes.
3. Persistent cystic structures involving bilateral ovaries.
Bilateral ovarian cysts were noted on ultrasound from 10/01/2018.
Based on patient's age, recommend a follow-up pelvic ultrasound to
ensure that these cystic structures are simple. Evidence for a
uterine fibroid.

## 2021-11-08 ENCOUNTER — Ambulatory Visit (INDEPENDENT_AMBULATORY_CARE_PROVIDER_SITE_OTHER): Payer: Medicare Other

## 2021-11-08 DIAGNOSIS — Z0000 Encounter for general adult medical examination without abnormal findings: Secondary | ICD-10-CM

## 2021-11-08 NOTE — Patient Instructions (Signed)
Cassidy Harrell , Thank you for taking time to come for your Medicare Wellness Visit. I appreciate your ongoing commitment to your health goals. Please review the following plan we discussed and let me know if I can assist you in the future.   Screening recommendations/referrals: Colonoscopy: done 12/30/19 repeat every 3 years  Mammogram: done 05/24/21 repeat every year  Bone Density: done 09/14/20 repeat every 2 years  Recommended yearly ophthalmology/optometry visit for glaucoma screening and checkup Recommended yearly dental visit for hygiene and checkup  Vaccinations: Influenza vaccine: done 11/30/20 repeat every year  Pneumococcal vaccine: Up to date Tdap vaccine: done 04/01/13 repeat every 10 years  Shingles vaccine: Shingrix is 2 doses 2-6 months apart and over 90% effective    Covid-19:completed 2/13, 3/19, 12/03/19 & 11/30/20  Advanced directives: Please bring a copy of your health care power of attorney and living will to the office at your convenience.  Conditions/risks identified: none at this time   Next appointment: Follow up in one year for your annual wellness visit    Preventive Care 65 Years and Older, Female Preventive care refers to lifestyle choices and visits with your health care provider that can promote health and wellness. What does preventive care include? A yearly physical exam. This is also called an annual well check. Dental exams once or twice a year. Routine eye exams. Ask your health care provider how often you should have your eyes checked. Personal lifestyle choices, including: Daily care of your teeth and gums. Regular physical activity. Eating a healthy diet. Avoiding tobacco and drug use. Limiting alcohol use. Practicing safe sex. Taking low-dose aspirin every day. Taking vitamin and mineral supplements as recommended by your health care provider. What happens during an annual well check? The services and screenings done by your health care provider  during your annual well check will depend on your age, overall health, lifestyle risk factors, and family history of disease. Counseling  Your health care provider may ask you questions about your: Alcohol use. Tobacco use. Drug use. Emotional well-being. Home and relationship well-being. Sexual activity. Eating habits. History of falls. Memory and ability to understand (cognition). Work and work Statistician. Reproductive health. Screening  You may have the following tests or measurements: Height, weight, and BMI. Blood pressure. Lipid and cholesterol levels. These may be checked every 5 years, or more frequently if you are over 38 years old. Skin check. Lung cancer screening. You may have this screening every year starting at age 63 if you have a 30-pack-year history of smoking and currently smoke or have quit within the past 15 years. Fecal occult blood test (FOBT) of the stool. You may have this test every year starting at age 26. Flexible sigmoidoscopy or colonoscopy. You may have a sigmoidoscopy every 5 years or a colonoscopy every 10 years starting at age 75. Hepatitis C blood test. Hepatitis B blood test. Sexually transmitted disease (STD) testing. Diabetes screening. This is done by checking your blood sugar (glucose) after you have not eaten for a while (fasting). You may have this done every 1-3 years. Bone density scan. This is done to screen for osteoporosis. You may have this done starting at age 64. Mammogram. This may be done every 1-2 years. Talk to your health care provider about how often you should have regular mammograms. Talk with your health care provider about your test results, treatment options, and if necessary, the need for more tests. Vaccines  Your health care provider may recommend certain vaccines, such  as: Influenza vaccine. This is recommended every year. Tetanus, diphtheria, and acellular pertussis (Tdap, Td) vaccine. You may need a Td booster every  10 years. Zoster vaccine. You may need this after age 82. Pneumococcal 13-valent conjugate (PCV13) vaccine. One dose is recommended after age 84. Pneumococcal polysaccharide (PPSV23) vaccine. One dose is recommended after age 4. Talk to your health care provider about which screenings and vaccines you need and how often you need them. This information is not intended to replace advice given to you by your health care provider. Make sure you discuss any questions you have with your health care provider. Document Released: 03/12/2015 Document Revised: 11/03/2015 Document Reviewed: 12/15/2014 Elsevier Interactive Patient Education  2017 [employer] Prevention in the Home Falls can cause injuries. They can happen to people of all ages. There are many things you can do to make your home safe and to help prevent falls. What can I do on the outside of my home? Regularly fix the edges of walkways and driveways and fix any cracks. Remove anything that might make you trip as you walk through a door, such as a raised step or threshold. Trim any bushes or trees on the path to your home. Use bright outdoor lighting. Clear any walking paths of anything that might make someone trip, such as rocks or tools. Regularly check to see if handrails are loose or broken. Make sure that both sides of any steps have handrails. Any raised decks and porches should have guardrails on the edges. Have any leaves, snow, or ice cleared regularly. Use sand or salt on walking paths during winter. Clean up any spills in your garage right away. This includes oil or grease spills. What can I do in the bathroom? Use night lights. Install grab bars by the toilet and in the tub and shower. Do not use towel bars as grab bars. Use non-skid mats or decals in the tub or shower. If you need to sit down in the shower, use a plastic, non-slip stool. Keep the floor dry. Clean up any water that spills on the floor as soon as it  happens. Remove soap buildup in the tub or shower regularly. Attach bath mats securely with double-sided non-slip rug tape. Do not have throw rugs and other things on the floor that can make you trip. What can I do in the bedroom? Use night lights. Make sure that you have a light by your bed that is easy to reach. Do not use any sheets or blankets that are too big for your bed. They should not hang down onto the floor. Have a firm chair that has side arms. You can use this for support while you get dressed. Do not have throw rugs and other things on the floor that can make you trip. What can I do in the kitchen? Clean up any spills right away. Avoid walking on wet floors. Keep items that you use a lot in easy-to-reach places. If you need to reach something above you, use a strong step stool that has a grab bar. Keep electrical cords out of the way. Do not use floor polish or wax that makes floors slippery. If you must use wax, use non-skid floor wax. Do not have throw rugs and other things on the floor that can make you trip. What can I do with my stairs? Do not leave any items on the stairs. Make sure that there are handrails on both sides of the stairs and use  them. Fix handrails that are broken or loose. Make sure that handrails are as long as the stairways. Check any carpeting to make sure that it is firmly attached to the stairs. Fix any carpet that is loose or worn. Avoid having throw rugs at the top or bottom of the stairs. If you do have throw rugs, attach them to the floor with carpet tape. Make sure that you have a light switch at the top of the stairs and the bottom of the stairs. If you do not have them, ask someone to add them for you. What else can I do to help prevent falls? Wear shoes that: Do not have high heels. Have rubber bottoms. Are comfortable and fit you well. Are closed at the toe. Do not wear sandals. If you use a stepladder: Make sure that it is fully opened.  Do not climb a closed stepladder. Make sure that both sides of the stepladder are locked into place. Ask someone to hold it for you, if possible. Clearly mark and make sure that you can see: Any grab bars or handrails. First and last steps. Where the edge of each step is. Use tools that help you move around (mobility aids) if they are needed. These include: Canes. Walkers. Scooters. Crutches. Turn on the lights when you go into a dark area. Replace any light bulbs as soon as they burn out. Set up your furniture so you have a clear path. Avoid moving your furniture around. If any of your floors are uneven, fix them. If there are any pets around you, be aware of where they are. Review your medicines with your doctor. Some medicines can make you feel dizzy. This can increase your chance of falling. Ask your doctor what other things that you can do to help prevent falls. This information is not intended to replace advice given to you by your health care provider. Make sure you discuss any questions you have with your health care provider. Document Released: 12/10/2008 Document Revised: 07/22/2015 Document Reviewed: 03/20/2014 Elsevier Interactive Patient Education  2017 [employer].

## 2021-11-08 NOTE — Progress Notes (Cosign Needed Addendum)
Virtual Visit via Telephone Note  I connected with  Cassidy Harrell on 11/08/21 at  2:00 PM EDT by telephone and verified that I am speaking with the correct person using two identifiers.  Medicare Annual Wellness visit completed telephonically due to Covid-19 pandemic.   Persons participating in this call: This Health Coach and this patient.   Location: Patient: home Provider: office   I discussed the limitations, risks, security and privacy concerns of performing an evaluation and management service by telephone and the availability of in person appointments. The patient expressed understanding and agreed to proceed.  Unable to perform video visit due to video visit attempted and failed and/or patient does not have video capability.   Some vital signs may be absent or patient reported.   Cassidy Brace, LPN   Subjective:   Cassidy Harrell is a 71 y.o. female who presents for Medicare Annual (Subsequent) preventive examination.  Review of Systems     Cardiac Risk Factors include: advanced age (>40mn, >>26women);diabetes mellitus;hypertension;obesity (BMI >30kg/m2)     Objective:    There were no vitals filed for this visit. There is no height or weight on file to calculate BMI.     11/08/2021    2:14 PM 08/18/2020    3:08 PM 08/18/2019    1:50 PM 06/25/2019    1:28 PM 04/02/2019    4:00 AM 04/01/2019    6:48 PM  Advanced Directives  Does Patient Have a Medical Advance Directive? Yes Yes Yes Yes Yes Yes  Type of AParamedicof AD[place]Living will [place]if[place]Living will HR[place]Living will H[place]anLiving will HTavaresLiving will   Does patient want to make changes to medical advance directive?   No - Patient declined  No - Patient declined   Copy of [place]in Chart? No - copy requested No - copy requested No - copy requested No - copy requested No -  copy requested     Current Medications (verified) Outpatient Encounter Medications as of 11/08/2021  Medication Sig   acetaminophen (TYLENOL) 650 MG CR tablet Take 650 mg by mouth in the morning and at bedtime.   aspirin EC 81 MG tablet Take 81 mg by mouth daily with lunch.   blood glucose meter kit and supplies KIT Dispense based on patient and insurance preference. Use up to four times daily as directed. (FOR ICD-9 250.00, 250.01).   calcium carbonate (OSCAL) 1500 (600 Ca) MG TABS tablet Take 1,200 mg by mouth daily with lunch. 800 units vitamin D   famotidine (PEPCID) 20 MG tablet TAKE 1 TABLET BY MOUTH AT  BEDTIME FOR BURPING   glucose blood (ONETOUCH VERIO) test strip Check blood sugars daily   hydrALAZINE (APRESOLINE) 25 MG tablet Take 1 tablet (25 mg total) by mouth 3 (three) times daily.   hydrochlorothiazide (HYDRODIURIL) 25 MG tablet TAKE 1 TABLET BY MOUTH DAILY   meloxicam (MOBIC) 7.5 MG tablet TAKE 1 TABLET BY MOUTH DAILY   metoprolol succinate (TOPROL-XL) 50 MG 24 hr tablet TAKE 1 AND 1/2 TABLETS BY MOUTH  DAILY TAKE WITH OR IMMEDIATELY  FOLLOWING A MEAL   Multiple Vitamins-Minerals (MULTIVITAMIN WITH MINERALS) tablet Take 1 tablet by mouth daily.   ondansetron (ZOFRAN) 4 MG tablet TAKE 1 TABLET BY MOUTH TWICE  DAILY AS NEEDED   pantoprazole (PROTONIX) 40 MG tablet TAKE 1 TABLET BY MOUTH  DAILY   polyethylene glycol (MIRALAX /  GLYCOLAX) 17 g packet Take 17 g by mouth daily as needed.   No facility-administered encounter medications on file as of 11/08/2021.    Allergies (verified) Aldactone [spironolactone] and Lisinopril   History: Past Medical History:  Diagnosis Date   Acute perforated appendicitis 04/02/2019   Anemia    Arthritis    Depression    Diabetes mellitus without complication (HCC)    Dysrhythmia    Over 10 years ago   Fatty liver    Gum disease    Heart murmur    History of syphilis    Hypertension    Irregular heart beat    Personal history of  colonic polyps    Phlebitis    Pre-diabetes    Past Surgical History:  Procedure Laterality Date   CYSTECTOMY     between breasts   IR RADIOLOGIST EVAL & MGMT  04/15/2019   IR RADIOLOGIST EVAL & MGMT  04/29/2019   IR RADIOLOGIST EVAL & MGMT  05/08/2019   LAPAROSCOPIC APPENDECTOMY N/A 06/30/2019   Procedure: APPENDECTOMY LAPAROSCOPIC;  Surgeon: Ralene Ok, MD;  Location: [facility];  Service: General;  Laterality: N/A;   SHOULDER ARTHROSCOPY Right 12/2015   SPINE SURGERY  08/20/2015   bulging / herniated disc in C-spine   THERAPEUTIC [place], 1978   Family History  Problem Relation Age of Onset   Diabetes Mother    Pancreatic cancer Mother    Cancer Father        lung cancer   Kidney disease Father    Seizures Father    Colon cancer Brother    Heart attack Brother    Stroke Brother    Stomach cancer Neg Hx    Esophageal cancer Neg Hx    Rectal cancer Neg Hx    Social History   Socioeconomic History   Marital status: Divorced    Spouse name: Not on file   Number of children: 1   Years of education: Not on file   Highest education level: Not on file  Occupational History   Not on file  Tobacco Use   Smoking status: Former    Packs/day: 1.00    Years: 40.00    Total pack years: 40.00    Types: Cigarettes    Quit date: 2011    Years since quitting: 12.7   Smokeless tobacco: Never  Vaping Use   Vaping Use: Never used  Substance and Sexual Activity   Alcohol use: No   Drug use: No   Sexual activity: Not on file  Other Topics Concern   Not on file  Social History Narrative   Regular exercise:  No   Caffeine Use:  No   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    Divorced   Son Age 110- alive and well   2 grand daughters- age 17 and an adopted grandaughter age 32   [place] reading, walking spending time with family, 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[employer]            Social Determinants  of Health   Financial Resource Strain: Low Risk  (11/08/2021)   Overall Financial Resource Strain (CARDIA)    Difficulty of Paying Living Expenses: Not hard at all  Food Insecurity: No Food Insecurity (11/08/2021)   Hunger Vital Sign    Worried About Running Out of Food in the Last Year: Never true    [place] in the Last Year:  Never true  Transportation Needs: No Transportation Needs (11/08/2021)   PRAPARE - Hydrologist (Medical): No    Lack of Transportation (Non-Medical): No  Physical Activity: Inactive (11/08/2021)   Exercise Vital Sign    Days of Exercise per Week: 0 days    Minutes of Exercise per Session: 0 min  Stress: No Stress Concern Present (11/08/2021)   [place]    Feeling of Stress : Not at all  Social Connections: Moderately Integrated (11/08/2021)   Social Connection and Isolation Panel [NHANES]    Frequency of Communication with Friends and Family: More than three times a week    Frequency of Social Gatherings with Friends and Family: More than three times a week    Attends Religious Services: More than 4 times per year    Active Member of [employer] or Organizations: Yes    Attends Archivist Meetings: 1 to 4 times per year    Marital Status: Divorced    Tobacco Counseling Counseling given: Not Answered   Clinical Intake:  Pre-visit preparation completed: Yes  Pain : No/denies pain     Nutritional Risks: None Diabetes: Yes CBG done?: No Did pt. bring in CBG monitor from home?: No  How often do you need to have someone help you when you read instructions, pamphlets, or other written materials from your doctor or pharmacy?: 1 - Never  Diabetic?Nutrition Risk Assessment:  Has the patient had any N/V/D within the last 2 months?  No  Does the patient have any non-healing wounds?  No  Has the patient had any unintentional weight loss or weight gain?   No   Diabetes:  Is the patient diabetic?  No  If diabetic, was a CBG obtained today?  No  Did the patient bring in their glucometer from home?  No  How often do you monitor your CBG's? N/A.   Financial Strains and Diabetes Management:  Are you having any financial strains with the device, your supplies or your medication? No .  Does the patient want to be seen by Chronic Care Management for management of their diabetes?  No  Would the patient like to be referred to a Nutritionist or for Diabetic Management?  No   Diabetic Exams:  Diabetic Eye Exam: Completed 07/05/21 Diabetic Foot Exam: Completed 09/06/21   Interpreter Needed?: No  Information entered by :: Charlott Rakes, LPN   Activities of Daily Living    11/08/2021    2:15 PM 11/07/2021    2:38 PM  In your present state of health, do you have any difficulty performing the following activities:  Hearing? 0 0  Vision? 0 0  Difficulty concentrating or making decisions? 0 0  Walking or climbing stairs? 0 1  Dressing or bathing? 0 0  Doing errands, shopping? 0 0  Preparing Food and eating ? N N  Using the Toilet? N N  In the past six months, have you accidently leaked urine? N N  Do you have problems with loss of bowel control? N N  Managing your Medications? N N  Managing your Finances? N N  Housekeeping or managing your Housekeeping? N N    Patient Care Team: [name], NP as PCP - General (Internal Medicine) Karie Chimera, MD as Consulting Physician (Neurosurgery)  Indicate any recent Medical Services you may have received from other than [facility] providers in the past year (date may be approximate).  Assessment:   This is a routine wellness examination for Natara.  Hearing/Vision screen Hearing Screening - Comments:: Pt denies any hearing issues  Vision Screening - Comments:: Pt follows up with provider   Dietary issues and exercise activities discussed: Current Exercise Habits: The patient does  not participate in regular exercise at present   Goals Addressed             This Visit's Progress    Patient Stated       None at this time        Depression Screen    11/08/2021    2:13 PM 09/06/2021    1:27 PM 08/18/2020    3:10 PM 08/18/2019    1:56 PM 05/19/2015   11:41 AM  PHQ 2/9 Scores  PHQ - 2 Score 0 0 0 0 0    Fall Risk    11/08/2021    2:15 PM 11/07/2021    2:38 PM 09/06/2021    1:27 PM 08/18/2020    3:09 PM 08/18/2019    1:56 PM  [place] in the past year? 0 0 0 0 0  Number falls in past yr: 0 0 0 0 0  Injury with Fall? 0 0 0 0 0  Risk for fall due to : No Fall Risks;Impaired vision      Follow up Falls prevention discussed  Falls evaluation completed Falls prevention discussed Education provided;Falls prevention discussed    FALL RISK PREVENTION PERTAINING TO THE HOME:  Any stairs in or around the home? No  If so, are there any without handrails? No  Home free of loose throw rugs in walkways, pet beds, electrical cords, etc? Yes  Adequate lighting in your home to reduce risk of falls? Yes   ASSISTIVE DEVICES UTILIZED TO PREVENT FALLS:  Life alert? No  Use of a cane, walker or w/c? No  Grab bars in the bathroom? Yes  Shower chair or bench in shower? No  Elevated toilet seat or a handicapped toilet? No   TIMED UP AND GO:  Was the test performed? No .   Cognitive Function:        11/08/2021    2:17 PM  6CIT Screen  What Year? 0 points  What month? 0 points  What time? 0 points  Count back from 20 0 points  Months in reverse 0 points  Repeat phrase 0 points  Total Score 0 points    Immunizations Immunization History  Administered Date(s) Administered   Fluad Quad(high Dose 65+) 11/05/2018   Influenza Split 11/13/2011   Influenza, High Dose Seasonal PF 03/10/2016, 01/02/2017, 02/08/2018   Influenza,inj,Quad PF,6+ Mos 11/24/2013   Influenza-Unspecified 11/27/2012, 12/03/2019, 11/30/2020   PFIZER(Purple Top)SARS-COV-2  Vaccination 04/12/2019, 05/06/2019, 12/03/2019   Pfizer Covid-19 Vaccine Bivalent Booster 2yr & up 11/30/2020   Pneumococcal Conjugate-13 04/04/2017   Pneumococcal Polysaccharide-23 11/24/2013, 10/31/2019   Tdap 04/01/2013   Zoster, Live 05/28/2015    TDAP status: Up to date  Flu Vaccine status: Up to date  Pneumococcal vaccine status: Up to date  Covid-19 vaccine status: Completed vaccines  Qualifies for Shingles Vaccine? Yes   Zostavax completed Yes   Shingrix Completed?: No.    Education has been provided regarding the importance of this vaccine. Patient has been advised to call insurance company to determine out of pocket expense if they have not yet received this vaccine. Advised may also receive vaccine at local pharmacy or Health Dept. Verbalized acceptance and understanding.  Screening  Tests Health Maintenance  Topic Date Due   Zoster Vaccines- Shingrix (1 of 2) Never done   COVID-19 Vaccine (5 - Pfizer series) 04/02/2021   INFLUENZA VACCINE  09/27/2021   HEMOGLOBIN A1C  03/09/2022   MAMMOGRAM  05/25/2022   OPHTHALMOLOGY EXAM  07/06/2022   FOOT EXAM  09/07/2022   COLONOSCOPY (Pts 45-49yr Insurance coverage will need to be confirmed)  12/30/2022   TETANUS/TDAP  04/02/2023   Pneumonia Vaccine 71 Years old  Completed   DEXA SCAN  Completed   Hepatitis C Screening  Completed   HPV VACCINES  Aged Out    Health Maintenance  Health Maintenance Due  Topic Date Due   Zoster Vaccines- Shingrix (1 of 2) Never done   COVID-19 Vaccine (5 - Pfizer series) 04/02/2021   INFLUENZA VACCINE  09/27/2021    Colorectal cancer screening: Type of screening: Colonoscopy. Completed 12/30/19. Repeat every 3 years  Mammogram status: Completed 05/24/21. Repeat every year  Bone Density status: Completed 09/14/20. Results reflect: Bone density results: NORMAL. Repeat every 2 years.   Additional Screening:  Hepatitis C Screening:  Completed 05/26/21  Vision Screening: Recommended  annual ophthalmology exams for early detection of glaucoma and other disorders of the eye. Is the patient up to date with their annual eye exam?  Yes  Who is the provider or what is the name of the office in which the patient attends annual eye exams? Unsure of providers name  If pt is not established with a provider, would they like to be referred to a provider to establish care? No .   Dental Screening: Recommended annual dental exams for proper oral hygiene  Community Resource Referral / Chronic Care Management: CRR required this visit?  No   CCM required this visit?  No      Plan:     I have personally reviewed and noted the following in the patient's chart:   Medical and social history Use of alcohol, tobacco or illicit drugs  Current medications and supplements including opioid prescriptions. Patient is not currently taking opioid prescriptions. Functional ability and status Nutritional status Physical activity Advanced directives List of other physicians Hospitalizations, surgeries, and ER visits in previous 12 months Vitals Screenings to include cognitive, depression, and falls Referrals and appointments  In addition, I have reviewed and discussed with patient certain preventive protocols, quality metrics, and best practice recommendations. A written personalized care plan for preventive services as well as general preventive health recommendations were provided to patient.     TWillette Brace LPN   [id number]  Nurse Notes: none

## 2021-11-10 DIAGNOSIS — U071 COVID-19: Secondary | ICD-10-CM | POA: Diagnosis not present

## 2021-11-16 ENCOUNTER — Encounter: Payer: Self-pay | Admitting: Family

## 2021-12-06 ENCOUNTER — Encounter: Payer: Self-pay | Admitting: Family

## 2021-12-06 ENCOUNTER — Ambulatory Visit (INDEPENDENT_AMBULATORY_CARE_PROVIDER_SITE_OTHER): Payer: Medicare Other | Admitting: Family

## 2021-12-06 VITALS — BP 118/75 | HR 75 | Temp 97.9°F | Resp 16 | Wt 186.0 lb

## 2021-12-06 DIAGNOSIS — E119 Type 2 diabetes mellitus without complications: Secondary | ICD-10-CM

## 2021-12-06 DIAGNOSIS — M1990 Unspecified osteoarthritis, unspecified site: Secondary | ICD-10-CM | POA: Diagnosis not present

## 2021-12-06 DIAGNOSIS — I10 Essential (primary) hypertension: Secondary | ICD-10-CM | POA: Diagnosis not present

## 2021-12-06 DIAGNOSIS — K219 Gastro-esophageal reflux disease without esophagitis: Secondary | ICD-10-CM

## 2021-12-06 LAB — MICROALBUMIN / CREATININE URINE RATIO
Creatinine,U: 156.2 mg/dL
Microalb Creat Ratio: 1.5 mg/g (ref 0.0–30.0)
Microalb, Ur: 2.4 mg/dL — ABNORMAL HIGH (ref 0.0–1.9)

## 2021-12-06 LAB — BASIC METABOLIC PANEL
BUN: 14 mg/dL (ref 6–23)
CO2: 28 mEq/L (ref 19–32)
Calcium: 9.7 mg/dL (ref 8.4–10.5)
Chloride: 102 mEq/L (ref 96–112)
Creatinine, Ser: 0.92 mg/dL (ref 0.40–1.20)
GFR: 62.96 mL/min (ref >60.00)
Glucose, Bld: 111 mg/dL — ABNORMAL HIGH (ref 70–99)
Potassium: 3.8 mEq/L (ref 3.5–5.1)
Sodium: 140 mEq/L (ref 135–145)

## 2021-12-06 LAB — HEMOGLOBIN A1C: Hgb A1c MFr Bld: 7 % — ABNORMAL HIGH (ref 4.6–6.5)

## 2021-12-06 MED ORDER — HYDROCHLOROTHIAZIDE 25 MG PO TABS: 25.0000 mg | ORAL_TABLET | Freq: Every day | ORAL | 1 refills | Status: DC

## 2021-12-06 MED ORDER — MELOXICAM 7.5 MG PO TABS: 7.5000 mg | ORAL_TABLET | Freq: Every day | ORAL | 1 refills | Status: DC

## 2021-12-06 MED ORDER — METOPROLOL SUCCINATE ER 50 MG PO TB24: ORAL_TABLET | ORAL | 1 refills | Status: DC

## 2021-12-06 NOTE — Assessment & Plan Note (Addendum)
Lab Results  Component Value Date   HGBA1C 6.8 (H) 09/06/2021   HGBA1C 6.7 (H) 05/13/2021   HGBA1C 6.9 (H) 05/04/2020   Lab Results  Component Value Date   MICROALBUR 0.7 09/16/2014   LDLCALC 11 05/13/2021   CREATININE 1.00 09/06/2021   Diet controlled. Obtain A1C.

## 2021-12-06 NOTE — Assessment & Plan Note (Addendum)
BP Readings from Last 3 Encounters:  12/06/21 (!) 149/61  09/06/21 132/60  06/30/21 130/60   Maintained on hydralazine, hctz and metoprolol. Initial bp elevated. Follow up bp was at goal. Continue current meds.

## 2021-12-06 NOTE — Progress Notes (Signed)
Subjective:   By signing my name below, I, Cassidy Harrell, attest that this documentation has been prepared under the direction and in the presence of Cassidy Chimera, NP 12/06/2021   Patient ID: Cassidy Harrell, female    DOB: 1951/01/31, 71 y.o.   MRN: [id number]  Chief Complaint  Patient presents with   Hypertension    Here for follow up   Diabetes    Here for follow up    HPI Patient is in today for an office visit.  Her brother died yesterday unexpectedly from a stroke.   Refill: She is requesting a refill of 25 Mg of Hydrochlorothiazide, 7.5 Mg of Meloxicam and 50 Mg of Metoprolol Succinate.   Blood Pressure: Her blood pressure is increasing. She is currently taking 75 Mg of Hydralazine, 25 mg of Hydrochlorothiazide, and 1 and 1/2 tablets of Metoprolol Succinate. Her blood pressure reading during today's visit is 118/75 BP Readings from Last 3 Encounters:  12/06/21 118/75  09/06/21 132/60  06/30/21 130/60   Pulse Readings from Last 3 Encounters:  12/06/21 75  09/06/21 74  06/30/21 78   Reflux: She reports that her reflux symptoms are improving. She is currently taking 20 Mg of Pepcid and 40 Mg of Protonix  A1C: She reports that she is attempting to eat well. Since Covid, her taste has not been the same Lab Results  Component Value Date   HGBA1C 6.8 (H) 09/06/2021   Immunizations: She is UTD on her Influenza and Covid vaccines. She reports that she has received a Shingles vaccine as well.  Health Maintenance Due  Topic Date Due   Diabetic kidney evaluation - Urine ACR  09/16/2015   COVID-19 Vaccine (5 - Pfizer series) 04/02/2021    Past Medical History:  Diagnosis Date   Acute perforated appendicitis 04/02/2019   Anemia    Arthritis    Depression    Diabetes mellitus without complication (HCC)    Dysrhythmia    Over 10 years ago   Fatty liver    Gum disease    Heart murmur    History of syphilis    Hypertension    Irregular heart beat     Personal history of colonic polyps    Phlebitis    Pre-diabetes     Past Surgical History:  Procedure Laterality Date   CYSTECTOMY     between breasts   IR RADIOLOGIST EVAL & MGMT  04/15/2019   IR RADIOLOGIST EVAL & MGMT  04/29/2019   IR RADIOLOGIST EVAL & MGMT  05/08/2019   LAPAROSCOPIC APPENDECTOMY N/A 06/30/2019   Procedure: APPENDECTOMY LAPAROSCOPIC;  Surgeon: Ralene Ok, MD;  Location: [facility];  Service: General;  Laterality: N/A;   SHOULDER ARTHROSCOPY Right 12/2015   SPINE SURGERY  08/20/2015   bulging / herniated disc in C-spine   THERAPEUTIC ABORTION  1970. 1974, 1978    Family History  Problem Relation Age of Onset   Diabetes Mother    Pancreatic cancer Mother    Cancer Father        lung cancer   Kidney disease Father    Seizures Father    Colon cancer Brother    Heart attack Brother    Stroke Brother    Stomach cancer Neg Hx    Esophageal cancer Neg Hx    Rectal cancer Neg Hx     Social History   Socioeconomic History   Marital status: Divorced    Spouse name: Not on file   Number  of children: 1   Years of education: Not on file   Highest education level: Not on file  Occupational History   Not on file  Tobacco Use   Smoking status: Former    Packs/day: 1.00    Years: 40.00    Total pack years: 40.00    Types: Cigarettes    Quit date: 2011    Years since quitting: 12.7   Smokeless tobacco: Never  Vaping Use   Vaping Use: Never used  Substance and Sexual Activity   Alcohol use: No   Drug use: No   Sexual activity: Not on file  Other Topics Concern   Not on file  Social History Narrative   Regular exercise:  No   Caffeine Use:  No   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    Divorced   Son Age 96- alive and well   2 grand daughters- age 68 and an adopted grandaughter age 59   Cecilia reading, walking spending time with family, 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[employer]             Social Determinants of Health   Financial Resource Strain: [place]  (11/08/2021)   Overall Financial Resource Strain (CARDIA)    Difficulty of Paying Living Expenses: Not hard at all  Food Insecurity: No Food Insecurity (11/08/2021)   Hunger Vital Sign    Worried About Running Out of Food in the Last Year: Never true    [place] in the Last Year: Never true  Transportation Needs: No Transportation Needs (11/08/2021)   PRAPARE - Hydrologist (Medical): No    Lack of Transportation (Non-Medical): No  Physical Activity: Inactive (11/08/2021)   Exercise Vital Sign    Days of Exercise per Week: 0 days    Minutes of Exercise per Session: 0 min  Stress: No Stress Concern Present (11/08/2021)   [place]    Feeling of Stress : Not at all  Social Connections: Moderately Integrated (11/08/2021)   Social Connection and Isolation Panel [NHANES]    Frequency of Communication with Friends and Family: More than three times a week    Frequency of Social Gatherings with Friends and Family: More than three times a week    Attends Religious Services: More than 4 times per year    Active Member of [employer] or Organizations: Yes    Attends Archivist Meetings: 1 to 4 times per year    Marital Status: Divorced  Intimate Partner Violence: Not At Risk (11/08/2021)   Humiliation, Afraid, Rape, and Kick questionnaire    Fear of Current or Ex-Partner: No    Emotionally Abused: No    Physically Abused: No    Sexually Abused: No    Outpatient Medications Prior to Visit  Medication Sig Dispense Refill   acetaminophen (TYLENOL) 650 MG CR tablet Take 650 mg by mouth in the morning and at bedtime.     aspirin EC 81 MG tablet Take 81 mg by mouth daily with lunch.     blood glucose meter kit and supplies KIT Dispense based on patient and insurance preference. Use up to four times daily as directed. (FOR  ICD-9 250.00, 250.01). 1 each 0   calcium carbonate (OSCAL) 1500 (600 Ca) MG TABS tablet Take 1,200 mg by mouth daily with lunch. 800 units vitamin D     famotidine (  PEPCID) 20 MG tablet TAKE 1 TABLET BY MOUTH AT  BEDTIME FOR BURPING 90 tablet 2   glucose blood (ONETOUCH VERIO) test strip Check blood sugars daily 100 each 12   hydrALAZINE (APRESOLINE) 25 MG tablet Take 1 tablet (25 mg total) by mouth 3 (three) times daily. 270 tablet 1   Multiple Vitamins-Minerals (MULTIVITAMIN WITH MINERALS) tablet Take 1 tablet by mouth daily.     ondansetron (ZOFRAN) 4 MG tablet TAKE 1 TABLET BY MOUTH TWICE  DAILY AS NEEDED 180 tablet 0   pantoprazole (PROTONIX) 40 MG tablet TAKE 1 TABLET BY MOUTH  DAILY 90 tablet 3   polyethylene glycol (MIRALAX / GLYCOLAX) 17 g packet Take 17 g by mouth daily as needed.     hydrochlorothiazide (HYDRODIURIL) 25 MG tablet TAKE 1 TABLET BY MOUTH DAILY 90 tablet 3   meloxicam (MOBIC) 7.5 MG tablet TAKE 1 TABLET BY MOUTH DAILY 90 tablet 3   metoprolol succinate (TOPROL-XL) 50 MG 24 hr tablet TAKE 1 AND 1/2 TABLETS BY MOUTH  DAILY TAKE WITH OR IMMEDIATELY  FOLLOWING A MEAL 135 tablet 1   No facility-administered medications prior to visit.    Allergies  Allergen Reactions   Aldactone [Spironolactone] Other (See Comments)    itching   Lisinopril     Throat itching / cough    ROS See HPI    Objective:    Physical Exam Constitutional:      General: She is not in acute distress.    Appearance: Normal appearance. She is not ill-appearing.  HENT:     Head: Normocephalic and atraumatic.     Right Ear: External ear normal.     Left Ear: External ear normal.  Eyes:     Extraocular Movements: Extraocular movements intact.     Pupils: Pupils are equal, round, and reactive to light.  Cardiovascular:     Rate and Rhythm: Normal rate and regular rhythm.     Heart sounds: Normal heart sounds. No murmur heard.    No gallop.  Pulmonary:     Effort: Pulmonary effort is  normal. No respiratory distress.     Breath sounds: Normal breath sounds. No wheezing or rales.  Skin:    General: Skin is warm and dry.  Neurological:     Mental Status: She is alert and oriented to person, place, and time.  Psychiatric:        Mood and Affect: Mood normal.        Behavior: Behavior normal.        Judgment: Judgment normal.     BP 118/75   Pulse 75   Temp 97.9 F (36.6 C) (Oral)   Resp 16   Wt 186 lb (84.4 kg)   SpO2 100%   BMI 34.02 kg/m  Wt Readings from Last 3 Encounters:  12/06/21 186 lb (84.4 kg)  09/06/21 188 lb 3.2 oz (85.4 kg)  06/30/21 191 lb (86.6 kg)       Assessment & Plan:   Problem List Items Addressed This Visit       Unprioritized   HTN (hypertension)    BP Readings from Last 3 Encounters:  12/06/21 (!) 149/61  09/06/21 132/60  06/30/21 130/60  Maintained on hydralazine, hctz and metoprolol. Initial bp elevated. Follow up bp was at goal. Continue current meds.       Relevant Medications   metoprolol succinate (TOPROL-XL) 50 MG 24 hr tablet   hydrochlorothiazide (HYDRODIURIL) 25 MG tablet   Other Relevant Orders  Basic metabolic panel   GERD (gastroesophageal reflux disease)    Improved.  She is maintained on pepcid prn.       Diabetes type 2, controlled ([place])    Lab Results  Component Value Date   HGBA1C 6.8 (H) 09/06/2021   HGBA1C 6.7 (H) 05/13/2021   HGBA1C 6.9 (H) 05/04/2020   Lab Results  Component Value Date   MICROALBUR 0.7 09/16/2014   LDLCALC 11 05/13/2021   CREATININE 1.00 09/06/2021  Diet controlled. Obtain A1C.       Relevant Orders   Hemoglobin A1c   Urine Microalbumin w/creat. ratio   Other Visit Diagnoses     Osteoarthritis, unspecified osteoarthritis type, unspecified site    -  Primary   Relevant Medications   meloxicam (MOBIC) 7.5 MG tablet      Meds ordered this encounter  Medications   metoprolol succinate (TOPROL-XL) 50 MG 24 hr tablet    Sig: TAKE 1 AND 1/2 TABLETS BY MOUTH  DAILY  TAKE WITH OR IMMEDIATELY  FOLLOWING A MEAL    Dispense:  135 tablet    Refill:  1    Requesting 1 year supply    Order Specific Question:   Supervising Provider    Answer:   Penni Homans A [[id number]]   meloxicam (MOBIC) 7.5 MG tablet    Sig: Take 1 tablet (7.5 mg total) by mouth daily.    Dispense:  90 tablet    Refill:  1    Requesting 1 year supply    Order Specific Question:   Supervising Provider    Answer:   Penni Homans A [[id number]]   hydrochlorothiazide (HYDRODIURIL) 25 MG tablet    Sig: Take 1 tablet (25 mg total) by mouth daily.    Dispense:  90 tablet    Refill:  1    Requesting 1 year supply    Order Specific Question:   Supervising Provider    Answer:   Penni Homans A [[id number]]    I, [name], NP, personally preformed the services described in this documentation.  All medical record entries made by the scribe were at my direction and in my presence.  I have reviewed the chart and discharge instructions (if applicable) and agree that the record reflects my personal performance and is accurate and complete. 12/06/2021   I,Amber Collins,acting as a scribe for [name], NP.,have documented all relevant documentation on the behalf of [name], NP,as directed by  [name], NP while in the presence of [name], NP.    [name], NP

## 2021-12-06 NOTE — Assessment & Plan Note (Signed)
Improved.  She is maintained on pepcid prn.

## 2021-12-08 IMAGING — CT CT ABD-PELV W/ CM
1 of 2 series · 14 of 32 positions shown, 18 images · IV contrast (APPLIED)
Comparison: 04/15/2019

CLINICAL DATA: Perforated appendicitis follow-up.

EXAM:
CT ABDOMEN AND PELVIS WITH CONTRAST
TECHNIQUE: Multidetector CT imaging of the abdomen and pelvis was performed
using the standard protocol following bolus administration of
intravenous contrast.
CONTRAST:  100mL ULH5XJ-2YY IOPAMIDOL (ULH5XJ-2YY) INJECTION 61%

[Series 2: abd/pelvis w/cm · axial · 0.81mm/px · z∈[-493,-73]mm · 14 of 93 slices shown, 18 images]
[im 5/93  soft-tissue]
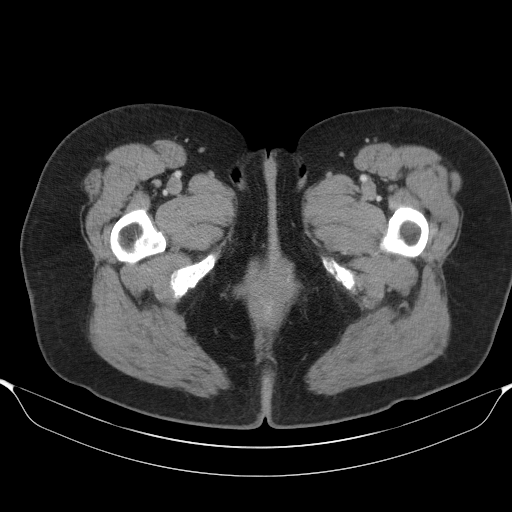
[im 5/93  bone]
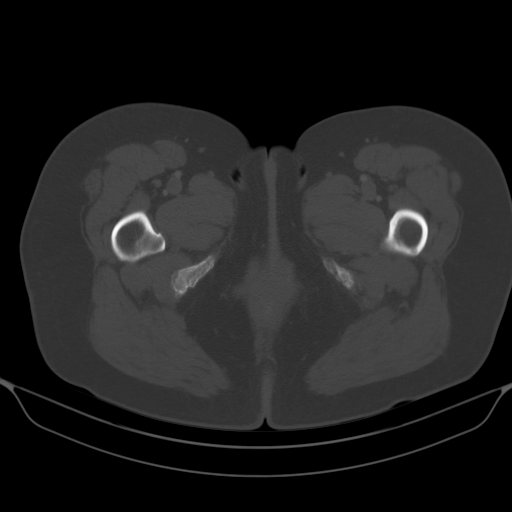
[im 13/93  soft-tissue]
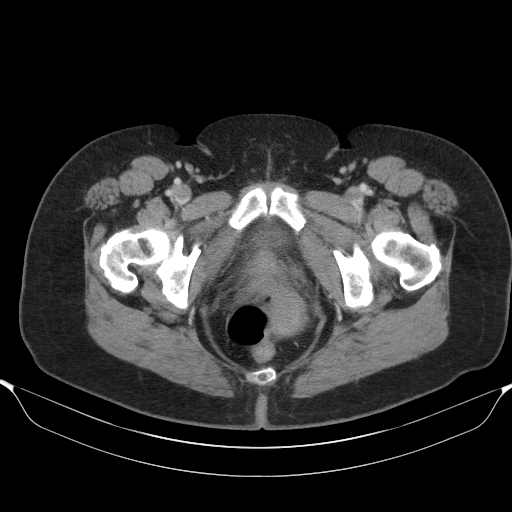
[im 21/93  soft-tissue]
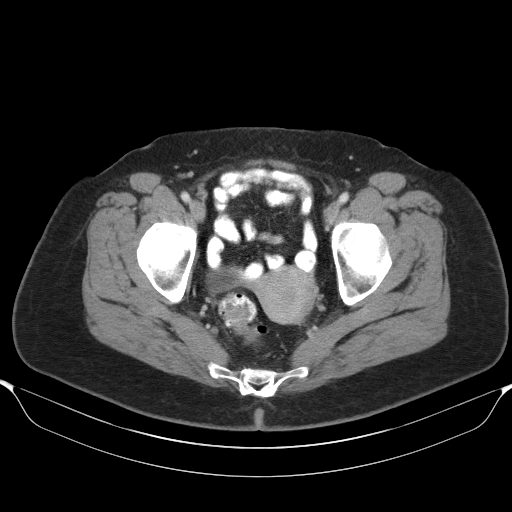
[im 29/93  soft-tissue]
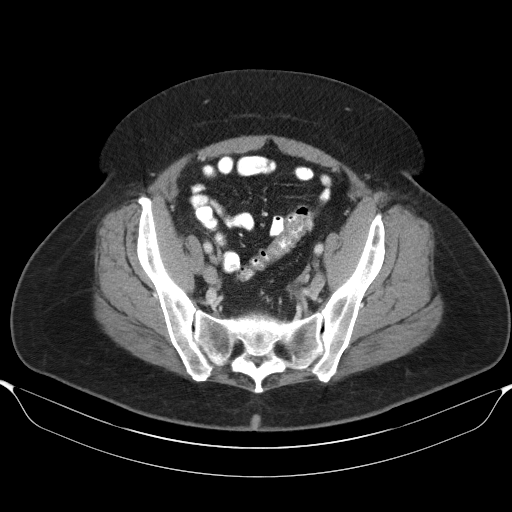
[im 37/93  soft-tissue]
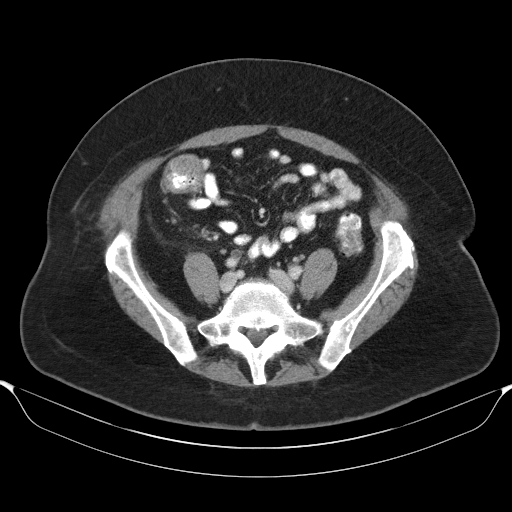
[im 45/93  soft-tissue]
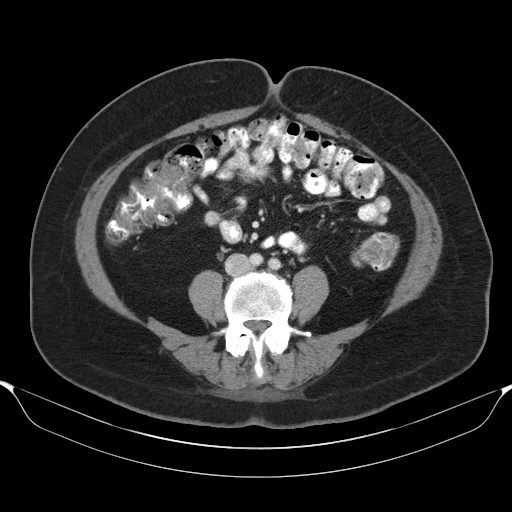
[im 49/93  soft-tissue]
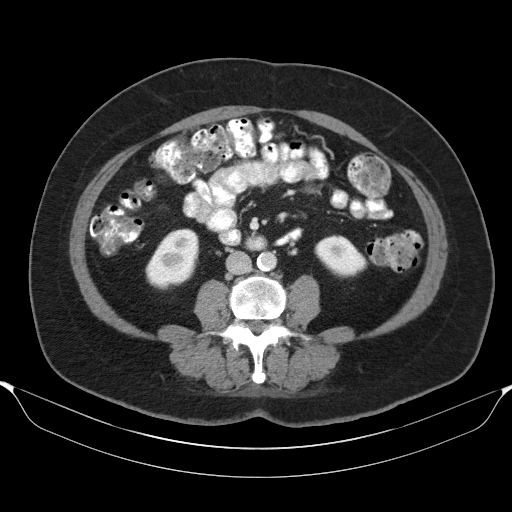
[im 57/93  soft-tissue]
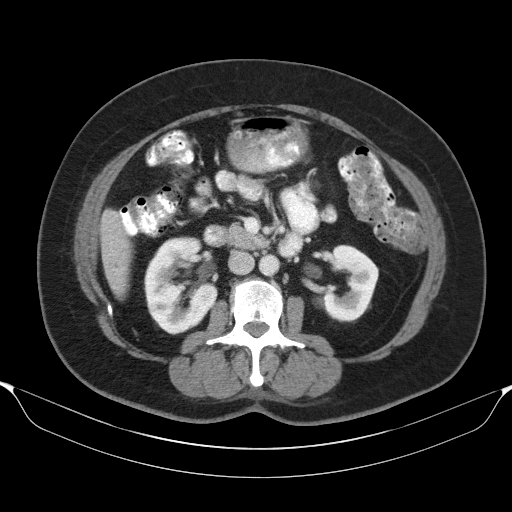
[im 65/93  soft-tissue]
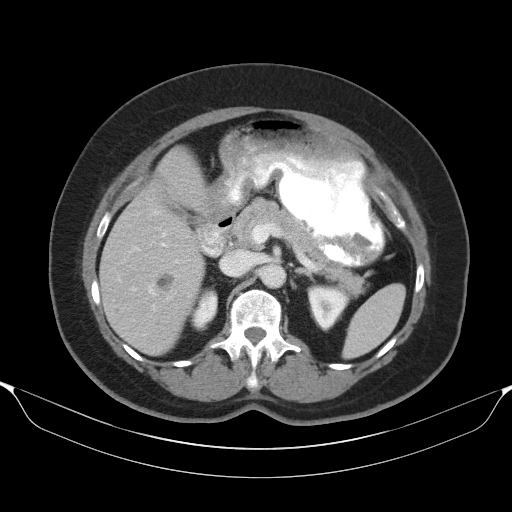
[im 65/93  bone]
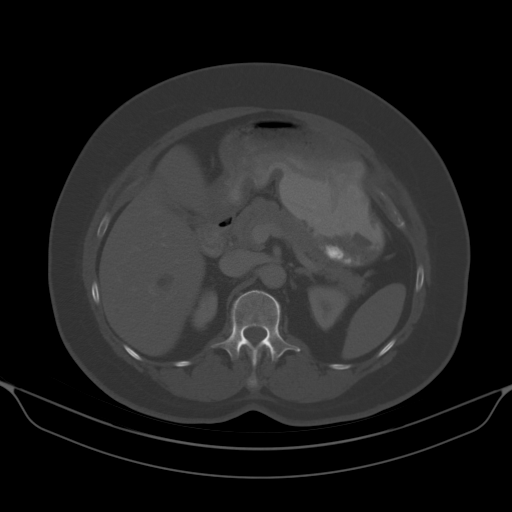
[im 73/93  soft-tissue]
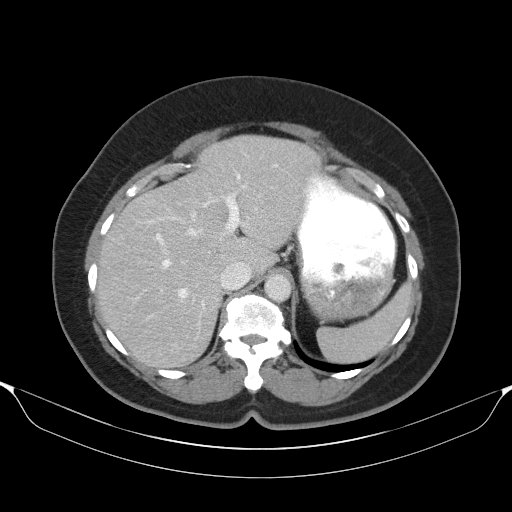
[im 77/93  lung]
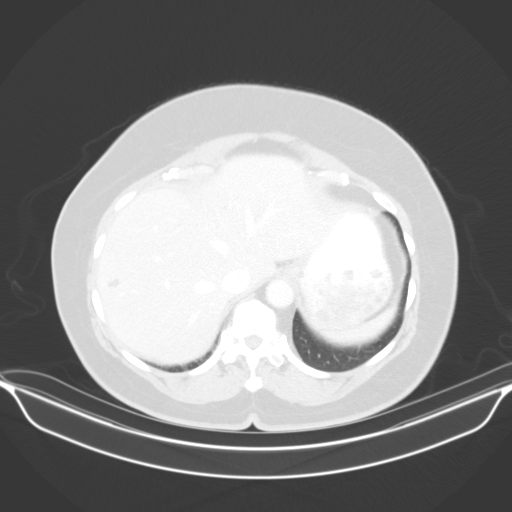
[im 81/93  soft-tissue]
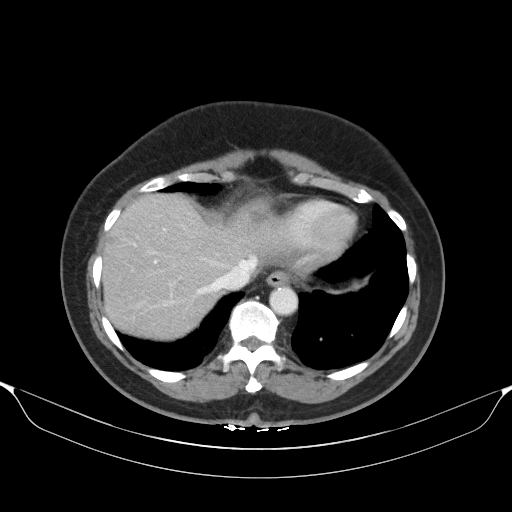
[im 81/93  lung]
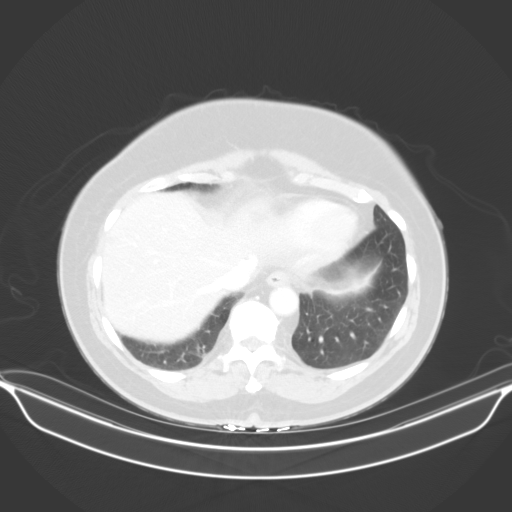
[im 85/93  lung]
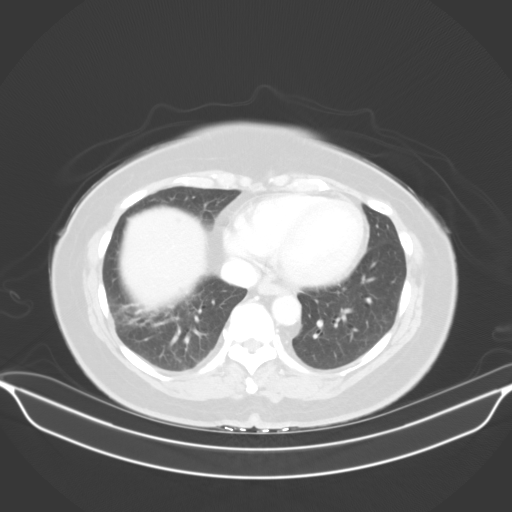
[im 89/93  soft-tissue]
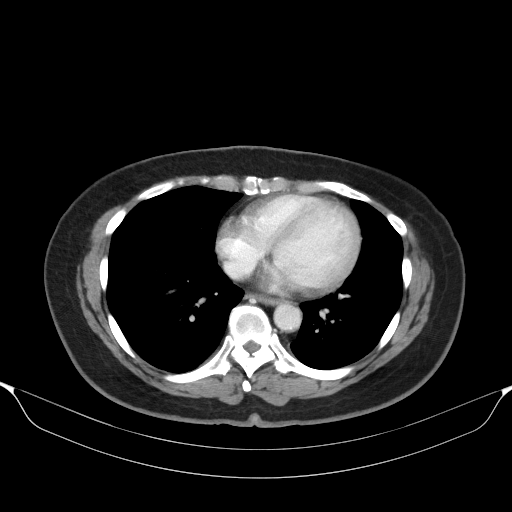
[im 89/93  lung]
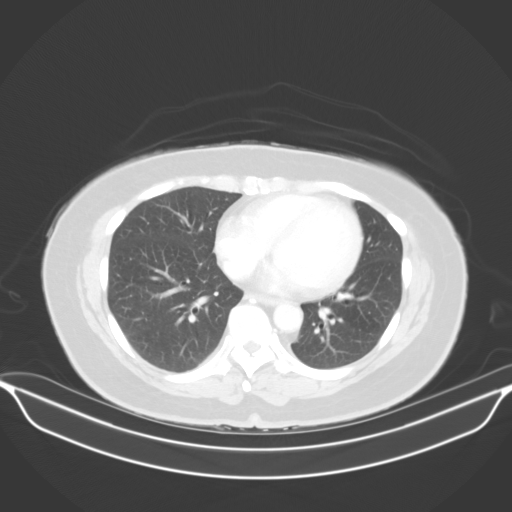

[14 of 32 positions shown; findings below may reference images not displayed]

FINDINGS: Lower chest: There is chronic scarring and bronchiectasis at the
right lung base.The heart size is normal.

Hepatobiliary: Hepatic cysts are noted. Normal gallbladder.There is
no biliary ductal dilation.

Pancreas: Normal contours without ductal dilatation. No
peripancreatic fluid collection.

Spleen: Unremarkable.

Adrenals/Urinary Tract:

--Adrenal glands: Unremarkable.

--Right kidney/ureter: There is a punctate nonobstructing stone in
the upper pole the right kidney.

--Left kidney/ureter: No hydronephrosis or radiopaque kidney stones.

--Urinary bladder: Unremarkable.

Stomach/Bowel:

--Stomach/Duodenum: No hiatal hernia or other gastric abnormality.
Normal duodenal course and caliber.

--Small bowel: Unremarkable.

--Colon: There is an above average amount of stool in the colon.

--Appendix: There are persistent but improved inflammatory changes
about the appendix. The appendix remains dilated, especially at the
tip where it measures approximately 1.3 cm. There is no evidence for
extraluminal air or an adjacent abscess.

Vascular/Lymphatic: Normal course and caliber of the major abdominal
vessels.

--No retroperitoneal lymphadenopathy.

--No mesenteric lymphadenopathy.

--No pelvic or inguinal lymphadenopathy.

Reproductive: There is a right-sided ovarian cystic mass measuring
approximately 2.8 cm. This was previously evaluated on a recent
pelvic ultrasound. There is a small cystic structure involving the
left ovary. This was previously evaluated on the recent pelvic
ultrasound. Uterine fibroids are noted.

Other: No ascites or free air. The abdominal wall is normal.

Musculoskeletal. There is no acute displaced fracture or
dislocation. There is spinal canal stenosis at the L2-L3 level.
There is mild spinal canal stenosis at the L3-L4 level. Both these
appear to be secondary to congenitally short pedicles in addition to
ligamentum flavum hypertrophy and facet arthrosis.
IMPRESSION: 1. There are persistent but improved inflammatory changes about the
appendix. The appendix remains dilated, especially at the tip where
it measures approximately 1.3 cm. There is no evidence for
extraluminal air or an adjacent abscess.
2. There is a punctate nonobstructing stone in the upper pole the
right kidney.

## 2022-01-19 ENCOUNTER — Other Ambulatory Visit: Payer: Self-pay | Admitting: Family

## 2022-01-27 ENCOUNTER — Encounter: Payer: Self-pay | Admitting: Family

## 2022-03-01 ENCOUNTER — Other Ambulatory Visit (HOSPITAL_BASED_OUTPATIENT_CLINIC_OR_DEPARTMENT_OTHER): Payer: Self-pay

## 2022-03-01 MED ORDER — AREXVY 120 MCG/0.5ML IM SUSR
INTRAMUSCULAR | 0 refills | Status: DC
  Filled 2022-03-01: qty 1, 1d supply, fill #0

## 2022-03-14 ENCOUNTER — Ambulatory Visit (INDEPENDENT_AMBULATORY_CARE_PROVIDER_SITE_OTHER): Payer: Medicare Other | Admitting: Family

## 2022-03-14 ENCOUNTER — Ambulatory Visit: Payer: Medicare Other | Admitting: Family

## 2022-03-14 VITALS — BP 138/45 | HR 80 | Temp 97.5°F | Resp 16 | Wt 193.0 lb

## 2022-03-14 DIAGNOSIS — F32A Depression, unspecified: Secondary | ICD-10-CM

## 2022-03-14 DIAGNOSIS — I10 Essential (primary) hypertension: Secondary | ICD-10-CM

## 2022-03-14 DIAGNOSIS — E119 Type 2 diabetes mellitus without complications: Secondary | ICD-10-CM

## 2022-03-14 DIAGNOSIS — M5441 Lumbago with sciatica, right side: Secondary | ICD-10-CM

## 2022-03-14 DIAGNOSIS — R202 Paresthesia of skin: Secondary | ICD-10-CM | POA: Diagnosis not present

## 2022-03-14 DIAGNOSIS — G8929 Other chronic pain: Secondary | ICD-10-CM | POA: Insufficient documentation

## 2022-03-14 NOTE — Progress Notes (Signed)
Subjective:   By signing my name below, I, Eugene Gavia, attest that this documentation has been prepared under the direction and in the presence of [name], NP 03/14/22   Patient ID: Cassidy Harrell, female    DOB: 07-23-1950, 72 y.o.   MRN: [id number]  Chief Complaint  Patient presents with   Hypertension    Here for follow up   Diabetes    Here for follow up   Tingling    Complains of tingling on both feet, more on the right side    HPI Patient is in today for 3 month follow up. She states that she does not need any medication refills today.  HTN: She reports compliance with HCTZ '25mg'$  daily, Apresoline '25mg'$  TID, and Toprol XL '75mg'$  daily. BP Readings from Last 5 Encounters:  03/14/22 (!) 138/45  12/06/21 118/75  09/06/21 132/60  06/30/21 130/60  06/21/21 (!) 141/65    T2DM: She is not on any medications for this. She states that her diet has not been good lately and she knows that she could work on it. Lab Results  Component Value Date   HGBA1C 7.0 (H) 12/06/2021   Lab Results  Component Value Date   MICROALBUR 2.4 (H) 12/06/2021   MICROALBUR 0.7 09/16/2014   Lab Results  Component Value Date   CREATININE 0.92 12/06/2021   BUN 14 12/06/2021   NA 140 12/06/2021   K 3.8 12/06/2021   CL 102 12/06/2021   CO2 28 12/06/2021    Tingling: She complains of bilateral leg/foot tingling R>L. She sometimes experiences sharp pains in her right foot as well. She reports chronic back pain with secondary right-sided sciatica.  GERD: She takes Protonix daily and only Pepcid occasionally with reflux flares.  Back pain, h/o OA: Her pain is controlled with Meloxicam 7.'5mg'$  daily. She takes Tylenol daily as well.  Immunizations: She states that she received the flu and COVID boosters as well as the RSV vaccine this season. She is due for the second Shingrix vaccine and plans to do this at her pharmacy.  Past Medical History:  Diagnosis Date   Acute perforated  appendicitis 04/02/2019   Anemia    Arthritis    Depression    Diabetes mellitus without complication (HCC)    Dysrhythmia    Over 10 years ago   Fatty liver    Gum disease    Heart murmur    History of syphilis    Hypertension    Irregular heart beat    Personal history of colonic polyps    Phlebitis    Pre-diabetes     Past Surgical History:  Procedure Laterality Date   CYSTECTOMY     between breasts   IR RADIOLOGIST EVAL & MGMT  04/15/2019   IR RADIOLOGIST EVAL & MGMT  04/29/2019   IR RADIOLOGIST EVAL & MGMT  05/08/2019   LAPAROSCOPIC APPENDECTOMY N/A 06/30/2019   Procedure: APPENDECTOMY LAPAROSCOPIC;  Surgeon: Ralene Ok, MD;  Location: [facility];  Service: General;  Laterality: N/A;   SHOULDER ARTHROSCOPY Right 12/2015   SPINE SURGERY  08/20/2015   bulging / herniated disc in C-spine   THERAPEUTIC ABORTION  1970. 1974, 1978    Family History  Problem Relation Age of Onset   Diabetes Mother    Pancreatic cancer Mother    Cancer Father        lung cancer   Kidney disease Father    Seizures Father    Colon cancer Brother  Heart attack Brother    Stroke Brother    Stomach cancer Neg Hx    Esophageal cancer Neg Hx    Rectal cancer Neg Hx     Social History   Socioeconomic History   Marital status: Divorced    Spouse name: Not on file   Number of children: 1   Years of education: Not on file   Highest education level: Not on file  Occupational History   Not on file  Tobacco Use   Smoking status: Former    Packs/day: 1.00    Years: 40.00    Total pack years: 40.00    Types: Cigarettes    Quit date: 2011    Years since quitting: 13.0   Smokeless tobacco: Never  Vaping Use   Vaping Use: Never used  Substance and Sexual Activity   Alcohol use: No   Drug use: No   Sexual activity: Not on file  Other Topics Concern   Not on file  Social History Narrative   Regular exercise:  No   Caffeine Use:  No   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    Divorced   Son Age 46- alive and well   2 grand daughters- age 55 and an adopted grandaughter age 62   [place] reading, walking spending time with family, 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[employer]            Social Determinants of Health   Financial Resource Strain: [place]  (11/08/2021)   Overall Financial Resource Strain (CARDIA)    Difficulty of Paying Living Expenses: Not hard at all  Food Insecurity: No Food Insecurity (11/08/2021)   Hunger Vital Sign    Worried About Running Out of Food in the Last Year: Never true    Friendship in the Last Year: Never true  Transportation Needs: No Transportation Needs (11/08/2021)   PRAPARE - Hydrologist (Medical): No    Lack of Transportation (Non-Medical): No  Physical Activity: Inactive (11/08/2021)   Exercise Vital Sign    Days of Exercise per Week: 0 days    Minutes of Exercise per Session: 0 min  Stress: No Stress Concern Present (11/08/2021)   [place]    Feeling of Stress : Not at all  Social Connections: Moderately Integrated (11/08/2021)   Social Connection and Isolation Panel [NHANES]    Frequency of Communication with Friends and Family: More than three times a week    Frequency of Social Gatherings with Friends and Family: More than three times a week    Attends Religious Services: More than 4 times per year    Active Member of [employer] or Organizations: Yes    Attends Archivist Meetings: 1 to 4 times per year    Marital Status: Divorced  Intimate Partner Violence: Not At Risk (11/08/2021)   Humiliation, Afraid, Rape, and Kick questionnaire    Fear of Current or Ex-Partner: No    Emotionally Abused: No    Physically Abused: No    Sexually Abused: No    Outpatient Medications Prior to Visit  Medication Sig Dispense Refill   acetaminophen (TYLENOL) 650 MG CR  tablet Take 650 mg by mouth in the morning and at bedtime.     aspirin EC 81 MG tablet Take 81 mg by mouth daily with lunch.     blood glucose  meter kit and supplies KIT Dispense based on patient and insurance preference. Use up to four times daily as directed. (FOR ICD-9 250.00, 250.01). 1 each 0   calcium carbonate (OSCAL) 1500 (600 Ca) MG TABS tablet Take 1,200 mg by mouth daily with lunch. 800 units vitamin D     glucose blood (ONETOUCH VERIO) test strip Check blood sugars daily 100 each 12   hydrALAZINE (APRESOLINE) 25 MG tablet Take 1 tablet (25 mg total) by mouth 3 (three) times daily. 270 tablet 1   hydrochlorothiazide (HYDRODIURIL) 25 MG tablet Take 1 tablet (25 mg total) by mouth daily. 90 tablet 1   meloxicam (MOBIC) 7.5 MG tablet Take 1 tablet (7.5 mg total) by mouth daily. 90 tablet 1   metoprolol succinate (TOPROL-XL) 50 MG 24 hr tablet TAKE 1 AND 1/2 TABLETS BY MOUTH  DAILY TAKE WITH OR IMMEDIATELY  FOLLOWING A MEAL 135 tablet 1   Multiple Vitamins-Minerals (MULTIVITAMIN WITH MINERALS) tablet Take 1 tablet by mouth daily.     ondansetron (ZOFRAN) 4 MG tablet TAKE 1 TABLET BY MOUTH TWICE  DAILY AS NEEDED 180 tablet 0   pantoprazole (PROTONIX) 40 MG tablet TAKE 1 TABLET BY MOUTH DAILY 90 tablet 1   polyethylene glycol (MIRALAX / GLYCOLAX) 17 g packet Take 17 g by mouth daily as needed.     famotidine (PEPCID) 20 MG tablet TAKE 1 TABLET BY MOUTH AT  BEDTIME FOR BURPING 90 tablet 2   RSV vaccine recomb adjuvanted (AREXVY) 120 MCG/0.5ML injection Inject into the muscle. 1 each 0   No facility-administered medications prior to visit.    Allergies  Allergen Reactions   Aldactone [Spironolactone] Other (See Comments)    itching   Lisinopril     Throat itching / cough    Review of Systems  Constitutional:  Negative for fever.  HENT:  Negative for congestion, sinus pain and sore throat.   Respiratory:  Negative for cough, shortness of breath and wheezing.   Cardiovascular:   Negative for chest pain and palpitations.  Gastrointestinal:  Positive for heartburn (intermittent reflux). Negative for blood in stool, constipation, diarrhea, nausea and vomiting.  Genitourinary:  Negative for dysuria, frequency and hematuria.  Musculoskeletal:  Positive for back pain (chronic), joint pain and myalgias (right foot, sharp pain, intermittent).  Neurological:  Positive for tingling (BLE R>L).       Objective:    Physical Exam Constitutional:      General: She is not in acute distress.    Appearance: Normal appearance. She is not ill-appearing.  HENT:     Head: Normocephalic and atraumatic.     Right Ear: External ear normal.     Left Ear: External ear normal.  Eyes:     Extraocular Movements: Extraocular movements intact.     Pupils: Pupils are equal, round, and reactive to light.  Cardiovascular:     Rate and Rhythm: Normal rate and regular rhythm.     Heart sounds: Normal heart sounds. No murmur heard.    No gallop.  Pulmonary:     Effort: Pulmonary effort is normal. No respiratory distress.     Breath sounds: Normal breath sounds. No wheezing or rales.  Skin:    General: Skin is warm and dry.  Neurological:     Mental Status: She is alert and oriented to person, place, and time.  Psychiatric:        Judgment: Judgment normal.     BP (!) 138/45   Pulse 80  Temp (!) 97.5 F (36.4 C) (Oral)   Resp 16   Wt 193 lb (87.5 kg)   SpO2 99%   BMI 35.30 kg/m  Wt Readings from Last 3 Encounters:  03/14/22 193 lb (87.5 kg)  12/06/21 186 lb (84.4 kg)  09/06/21 188 lb 3.2 oz (85.4 kg)       Assessment & Plan:  Controlled type 2 diabetes mellitus without complication, without long-term current use of insulin (HCC) Assessment & Plan: Lab Results  Component Value Date   HGBA1C 7.0 (H) 12/06/2021   She is working on diet.  Wt Readings from Last 3 Encounters:  03/14/22 193 lb (87.5 kg)  12/06/21 186 lb (84.4 kg)  09/06/21 188 lb 3.2 oz (85.4 kg)      Orders: -     Hemoglobin A1c -     Basic metabolic panel  Paresthesia Assessment & Plan: New. Could be related to sciatica. Will check b12 and folate.   Orders: -     B12 and Folate Panel  Primary hypertension Assessment & Plan: BP Readings from Last 3 Encounters:  03/14/22 (!) 138/45  12/06/21 118/75  09/06/21 132/60   Stable. Continue metoprolol, hydralazine and metoprolol.    Right-sided low back pain with right-sided sciatica, unspecified chronicity Assessment & Plan: Stable with meloxicam and tylenol.     Depression, unspecified depression type Assessment & Plan: Reports stable mood.       I,Alexis Herring,acting as a Education administrator for [employer], NP.,have documented all relevant documentation on the behalf of [name], NP,as directed by  [name], NP while in the presence of [name], NP.   I, [name], NP, personally preformed the services described in this documentation.  All medical record entries made by the scribe were at my direction and in my presence.  I have reviewed the chart and discharge instructions (if applicable) and agree that the record reflects my personal performance and is accurate and complete. 03/14/22   [name], NP

## 2022-03-14 NOTE — Assessment & Plan Note (Signed)
New. Could be related to sciatica. Will check b12 and folate.

## 2022-03-14 NOTE — Assessment & Plan Note (Signed)
BP Readings from Last 3 Encounters:  03/14/22 (!) 138/45  12/06/21 118/75  09/06/21 132/60   Stable. Continue metoprolol, hydralazine and metoprolol.

## 2022-03-14 NOTE — Assessment & Plan Note (Signed)
Lab Results  Component Value Date   HGBA1C 7.0 (H) 12/06/2021   She is working on diet.  Wt Readings from Last 3 Encounters:  03/14/22 193 lb (87.5 kg)  12/06/21 186 lb (84.4 kg)  09/06/21 188 lb 3.2 oz (85.4 kg)

## 2022-03-14 NOTE — Assessment & Plan Note (Signed)
-

## 2022-03-14 NOTE — Assessment & Plan Note (Signed)
Stable with meloxicam and tylenol.

## 2022-03-15 LAB — BASIC METABOLIC PANEL
BUN: 18 mg/dL (ref 6–23)
CO2: 27 mEq/L (ref 19–32)
Calcium: 9.6 mg/dL (ref 8.4–10.5)
Chloride: 103 mEq/L (ref 96–112)
Creatinine, Ser: 1.05 mg/dL (ref 0.40–1.20)
GFR: 53.63 mL/min — ABNORMAL LOW (ref >60.00)
Glucose, Bld: 139 mg/dL — ABNORMAL HIGH (ref 70–99)
Potassium: 3.7 mEq/L (ref 3.5–5.1)
Sodium: 140 mEq/L (ref 135–145)

## 2022-03-15 LAB — B12 AND FOLATE PANEL
Folate: 23.2 ng/mL (ref >5.9)
Vitamin B-12: 703 pg/mL (ref 211–911)

## 2022-03-15 LAB — HEMOGLOBIN A1C: Hgb A1c MFr Bld: 6.8 % — ABNORMAL HIGH (ref 4.6–6.5)

## 2022-03-21 ENCOUNTER — Other Ambulatory Visit: Payer: Self-pay | Admitting: Family

## 2022-03-21 ENCOUNTER — Encounter: Payer: Self-pay | Admitting: Family

## 2022-03-21 MED ORDER — GABAPENTIN 100 MG PO CAPS: 100.0000 mg | ORAL_CAPSULE | Freq: Three times a day (TID) | ORAL | 3 refills | Status: DC

## 2022-04-16 ENCOUNTER — Encounter: Payer: Self-pay | Admitting: Family

## 2022-04-17 MED ORDER — GABAPENTIN 100 MG PO CAPS: 100.0000 mg | ORAL_CAPSULE | Freq: Three times a day (TID) | ORAL | 1 refills | Status: DC

## 2022-04-17 MED ORDER — GABAPENTIN 100 MG PO CAPS: 100.0000 mg | ORAL_CAPSULE | Freq: Three times a day (TID) | ORAL | 0 refills | Status: DC

## 2022-04-30 ENCOUNTER — Other Ambulatory Visit: Payer: Self-pay | Admitting: Gastroenterology

## 2022-04-30 ENCOUNTER — Other Ambulatory Visit: Payer: Self-pay | Admitting: Family

## 2022-04-30 DIAGNOSIS — I10 Essential (primary) hypertension: Secondary | ICD-10-CM

## 2022-05-01 ENCOUNTER — Encounter: Payer: Self-pay | Admitting: Family

## 2022-05-01 DIAGNOSIS — M5416 Radiculopathy, lumbar region: Secondary | ICD-10-CM

## 2022-05-02 MED ORDER — GABAPENTIN 300 MG PO CAPS: 300.0000 mg | ORAL_CAPSULE | Freq: Three times a day (TID) | ORAL | 3 refills | Status: DC

## 2022-05-07 ENCOUNTER — Encounter: Payer: Self-pay | Admitting: Family

## 2022-05-07 DIAGNOSIS — M5416 Radiculopathy, lumbar region: Secondary | ICD-10-CM

## 2022-05-09 ENCOUNTER — Telehealth: Payer: Self-pay | Admitting: Family

## 2022-05-09 DIAGNOSIS — M5106 Intervertebral disc disorders with myelopathy, lumbar region: Secondary | ICD-10-CM | POA: Diagnosis not present

## 2022-05-09 DIAGNOSIS — M47896 Other spondylosis, lumbar region: Secondary | ICD-10-CM | POA: Diagnosis not present

## 2022-05-09 DIAGNOSIS — M5416 Radiculopathy, lumbar region: Secondary | ICD-10-CM | POA: Diagnosis not present

## 2022-05-09 MED ORDER — METHYLPREDNISOLONE 4 MG PO TBPK: ORAL_TABLET | ORAL | 0 refills | Status: DC

## 2022-05-09 NOTE — Telephone Encounter (Signed)
Pt called stating that she was wondering why she got two different referral to different offices when both do the same thing. Advised that the referrals looked like one was for a consultation while one was for neurosurgery. Pt stated she was still confused and would like a call to go over this. Advised a note would be sent back

## 2022-05-09 NOTE — Telephone Encounter (Signed)
Per provider she advised patient to go in to see Ortho.

## 2022-05-09 NOTE — Addendum Note (Signed)
Addended by: [name] on: 05/09/2022 07:09 AM   Modules accepted: Orders

## 2022-05-10 ENCOUNTER — Other Ambulatory Visit: Payer: Self-pay | Admitting: Orthopaedic Surgery

## 2022-05-10 DIAGNOSIS — M5416 Radiculopathy, lumbar region: Secondary | ICD-10-CM

## 2022-05-10 DIAGNOSIS — M47896 Other spondylosis, lumbar region: Secondary | ICD-10-CM

## 2022-05-30 ENCOUNTER — Ambulatory Visit
Admission: RE | Admit: 2022-05-30 | Discharge: 2022-05-30 | Disposition: A | Discharge status: Home / Self Care | Payer: Medicare Other | Source: Ambulatory Visit | Attending: Orthopaedic Surgery | Admitting: Orthopaedic Surgery

## 2022-05-30 DIAGNOSIS — M48061 Spinal stenosis, lumbar region without neurogenic claudication: Secondary | ICD-10-CM | POA: Diagnosis not present

## 2022-05-30 DIAGNOSIS — M5416 Radiculopathy, lumbar region: Secondary | ICD-10-CM

## 2022-05-30 DIAGNOSIS — M47896 Other spondylosis, lumbar region: Secondary | ICD-10-CM

## 2022-05-30 DIAGNOSIS — M545 Low back pain, unspecified: Secondary | ICD-10-CM | POA: Diagnosis not present

## 2022-05-30 DIAGNOSIS — R200 Anesthesia of skin: Secondary | ICD-10-CM | POA: Diagnosis not present

## 2022-06-07 ENCOUNTER — Other Ambulatory Visit: Payer: Self-pay | Admitting: Family

## 2022-06-07 ENCOUNTER — Other Ambulatory Visit: Payer: Self-pay | Admitting: Gastroenterology

## 2022-06-07 DIAGNOSIS — M1990 Unspecified osteoarthritis, unspecified site: Secondary | ICD-10-CM

## 2022-06-08 DIAGNOSIS — M48062 Spinal stenosis, lumbar region with neurogenic claudication: Secondary | ICD-10-CM | POA: Diagnosis not present

## 2022-06-08 DIAGNOSIS — M5416 Radiculopathy, lumbar region: Secondary | ICD-10-CM | POA: Diagnosis not present

## 2022-06-13 ENCOUNTER — Other Ambulatory Visit: Payer: Self-pay

## 2022-06-13 MED ORDER — GABAPENTIN 300 MG PO CAPS: 300.0000 mg | ORAL_CAPSULE | Freq: Three times a day (TID) | ORAL | 3 refills | Status: DC

## 2022-06-14 DIAGNOSIS — M48062 Spinal stenosis, lumbar region with neurogenic claudication: Secondary | ICD-10-CM | POA: Diagnosis not present

## 2022-06-30 ENCOUNTER — Telehealth: Payer: Self-pay | Admitting: Family

## 2022-06-30 DIAGNOSIS — M5431 Sciatica, right side: Secondary | ICD-10-CM | POA: Diagnosis not present

## 2022-06-30 DIAGNOSIS — M48062 Spinal stenosis, lumbar region with neurogenic claudication: Secondary | ICD-10-CM | POA: Diagnosis not present

## 2022-06-30 DIAGNOSIS — M5432 Sciatica, left side: Secondary | ICD-10-CM | POA: Diagnosis not present

## 2022-06-30 NOTE — Telephone Encounter (Signed)
Pt came in to drop off paperwork to be filled out and faxed back to [facility]. Forms placed in pcp's folder up front.

## 2022-07-03 ENCOUNTER — Telehealth: Payer: Self-pay | Admitting: Family

## 2022-07-03 NOTE — Telephone Encounter (Signed)
Please contact pt to schedule a surgical clearance visit with me.  °

## 2022-07-03 NOTE — Telephone Encounter (Signed)
Patient scheduled for Friday 07/07/2022

## 2022-07-06 NOTE — Progress Notes (Addendum)
Subjective:   By signing my name below, I, Cassidy Harrell, attest that this documentation has been prepared under the direction and in the presence of Lemont Fillers, NP 07/07/22   Patient ID: Cassidy Harrell, female    DOB: 09/05/1950, 72 y.o.   MRN: [id number]  No chief complaint on file.   HPI Patient is in today for an office visit/pre-operative evaluation and clearance. She is scheduled for a multilevel lumbar decompression surgery.  Neuropathy: She complains of tingling in right leg and foot. She is compliant with Gabapentin.   Diabetes: She continues to monitor her blood sugar at home and reports she was able to bring her sugar down from the 230s to 120s. She notes she has life stressors that have caused her to stress eat. She has a couple of days where she struggles to follow her diet, but is overall still eating healthier. She tends to eat meats such as chicken, [place], and fish. She has been trying to stay active and she joined the gym last week.  Lab Results  Component Value Date   HGBA1C 6.8 (H) 03/14/2022   Hypertension: She is currently compliant with 25 Hydralazine 3x daily, 75 mg Toprol-XL once daily, and 25 mg HCTZ daily. She is receptive to starting carvedilol in place of metoprolol.   Urinary symptoms:  She endorses recent urinary burning and frequency. She states her spinal condition may be a cause for her urinary symptoms. enies any incontinence.   Past Medical History:  Diagnosis Date   Acute perforated appendicitis 04/02/2019   Anemia    Arthritis    Depression    Diabetes mellitus without complication (HCC)    Dysrhythmia    Over 10 years ago   Fatty liver    Gum disease    Heart murmur    History of syphilis    Hypertension    Irregular heart beat    Personal history of colonic polyps    Phlebitis    Pre-diabetes     Past Surgical History:  Procedure Laterality Date   CYSTECTOMY     between breasts   IR RADIOLOGIST EVAL & MGMT   04/15/2019   IR RADIOLOGIST EVAL & MGMT  04/29/2019   IR RADIOLOGIST EVAL & MGMT  05/08/2019   LAPAROSCOPIC APPENDECTOMY N/A 06/30/2019   Procedure: APPENDECTOMY LAPAROSCOPIC;  Surgeon: Axel Filler, MD;  Location: [facility] OR;  Service: General;  Laterality: N/A;   SHOULDER ARTHROSCOPY Right 12/2015   SPINE SURGERY  08/20/2015   bulging / herniated disc in C-spine   THERAPEUTIC ABORTION  1970. 1974, 1978    Family History  Problem Relation Age of Onset   Diabetes Mother    Pancreatic cancer Mother    Cancer Father        lung cancer   Kidney disease Father    Seizures Father    Colon cancer Brother    Heart attack Brother    Stroke Brother    Stomach cancer Neg Hx    Esophageal cancer Neg Hx    Rectal cancer Neg Hx     Social History   Socioeconomic History   Marital status: Divorced    Spouse name: Not on file   Number of children: 1   Years of education: Not on file   Highest education level: Associate degree: occupational, Scientist, product/process development, or vocational program  Occupational History   Not on file  Tobacco Use   Smoking status: Former    Packs/day: 1.00  Years: 40.00    Additional pack years: 0.00    Total pack years: 40.00    Types: Cigarettes    Quit date: 2011    Years since quitting: 13.3   Smokeless tobacco: Never  Vaping Use   Vaping Use: Never used  Substance and Sexual Activity   Alcohol use: No   Drug use: No   Sexual activity: Not on file  Other Topics Concern   Not on file  Social History Narrative   Regular exercise:  No   Caffeine Use:  No   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    Divorced   Son Age 68- alive and well   2 grand daughters- age 72 and an adopted grandaughter age 71   Loves reading, walking spending time with family, 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            Social Determinants of Health   Financial Resource Strain: Low Risk  (07/03/2022)   Overall  Financial Resource Strain (CARDIA)    Difficulty of Paying Living Expenses: Not very hard  Food Insecurity: No Food Insecurity (07/03/2022)   Hunger Vital Sign    Worried About Running Out of Food in the Last Year: Never true    Ran Out of Food in the Last Year: Never true  Transportation Needs: No Transportation Needs (07/03/2022)   PRAPARE - Administrator, Civil Service (Medical): No    Lack of Transportation (Non-Medical): No  Physical Activity: Inactive (07/03/2022)   Exercise Vital Sign    Days of Exercise per Week: 0 days    Minutes of Exercise per Session: 0 min  Stress: No Stress Concern Present (07/03/2022)   [employer] of Occupational Health - Occupational Stress Questionnaire    Feeling of Stress : Not at all  Social Connections: Moderately Integrated (07/03/2022)   Social Connection and Isolation Panel [NHANES]    Frequency of Communication with Friends and Family: More than three times a week    Frequency of Social Gatherings with Friends and Family: Twice a week    Attends Religious Services: More than 4 times per year    Active Member of [employer] or Organizations: Yes    Attends Engineer, structural: More than 4 times per year    Marital Status: Divorced  Intimate Partner Violence: Not At Risk (11/08/2021)   Humiliation, Afraid, Rape, and Kick questionnaire    Fear of Current or Ex-Partner: No    Emotionally Abused: No    Physically Abused: No    Sexually Abused: No    Outpatient Medications Prior to Visit  Medication Sig Dispense Refill   acetaminophen (TYLENOL) 650 MG CR tablet Take 650 mg by mouth in the morning and at bedtime.     aspirin EC 81 MG tablet Take 81 mg by mouth daily with lunch.     blood glucose meter kit and supplies KIT Dispense based on patient and insurance preference. Use up to four times daily as directed. (FOR ICD-9 250.00, 250.01). 1 each 0   calcium carbonate (OSCAL) 1500 (600 Ca) MG TABS tablet Take 1,200 mg by mouth daily  with lunch. 800 units vitamin D     glucose blood (ONETOUCH VERIO) test strip Check blood sugars daily 100 each 12   hydrALAZINE (APRESOLINE) 25 MG tablet TAKE 1 TABLET BY MOUTH 3 TIMES  DAILY 270 tablet 1   hydrochlorothiazide (HYDRODIURIL) 25 MG tablet TAKE 1  TABLET BY MOUTH DAILY 90 tablet 3   meloxicam (MOBIC) 7.5 MG tablet TAKE 1 TABLET BY MOUTH DAILY 90 tablet 3   Multiple Vitamins-Minerals (MULTIVITAMIN WITH MINERALS) tablet Take 1 tablet by mouth daily.     ondansetron (ZOFRAN) 4 MG tablet TAKE 1 TABLET BY MOUTH TWICE  DAILY AS NEEDED 180 tablet 0   pantoprazole (PROTONIX) 40 MG tablet TAKE 1 TABLET BY MOUTH DAILY 90 tablet 3   polyethylene glycol (MIRALAX / GLYCOLAX) 17 g packet Take 17 g by mouth daily as needed.     metoprolol succinate (TOPROL-XL) 50 MG 24 hr tablet TAKE 1 AND 1/2 TABLETS BY MOUTH  DAILY TAKE WITH OR IMMEDIATELY  FOLLOWING A MEAL 135 tablet 3   gabapentin (NEURONTIN) 300 MG capsule Take 1 capsule (300 mg total) by mouth 3 (three) times daily. 90 capsule 3   methylPREDNISolone (MEDROL DOSEPAK) 4 MG TBPK tablet Please take per package instructions. 21 tablet 0   No facility-administered medications prior to visit.    Allergies  Allergen Reactions   Aldactone [Spironolactone] Other (See Comments)    itching   Lisinopril     Throat itching / cough    ROS See HPI    Objective:    Physical Exam Constitutional:      Appearance: She is well-developed.  HENT:     Right Ear: There is impacted cerumen.  Neck:     Thyroid: Thyromegaly (right thyroid fullness) present.     Comments: Right thyroid fullness   Cardiovascular:     Rate and Rhythm: Normal rate and regular rhythm.     Heart sounds: Normal heart sounds. No murmur heard. Pulmonary:     Effort: Pulmonary effort is normal. No respiratory distress.     Breath sounds: Normal breath sounds. No wheezing.  Musculoskeletal:     Right lower leg: 2+ Edema present.     Left lower leg: 2+ Edema present.   Psychiatric:        Behavior: Behavior normal.        Thought Content: Thought content normal.        Judgment: Judgment normal.     BP (!) 158/59 (BP Location: Left Arm, Patient Position: Sitting, Cuff Size: Large)   Pulse 86   Temp 97.7 F (36.5 C) (Oral)   Resp 16   Ht 5\' 2"  (1.575 m)   Wt 192 lb (87.1 kg)   SpO2 99%   BMI 35.12 kg/m  Wt Readings from Last 3 Encounters:  07/07/22 192 lb (87.1 kg)  03/14/22 193 lb (87.5 kg)  12/06/21 186 lb (84.4 kg)       Assessment & Plan:  Preoperative examination Assessment & Plan: CXR/labs as ordered.  EKG tracing is personally reviewed.  EKG notes NSR.  No acute changes.    Orders: -     EKG 12-Lead -     Protime-INR; Future -     APTT -     DG Chest 2 View; Future  Controlled type 2 diabetes mellitus without complication, without long-term current use of insulin [facility]) Assessment & Plan: Lab Results  Component Value Date   HGBA1C 6.8 (H) 03/14/2022   HGBA1C 7.0 (H) 12/06/2021   HGBA1C 6.8 (H) 09/06/2021   Lab Results  Component Value Date   MICROALBUR 2.4 (H) 12/06/2021   LDLCALC 11 05/13/2021   CREATININE 1.05 03/14/2022   Diet controlled.   Orders: -     Comprehensive metabolic panel -     Hemoglobin  A1c  Iron deficiency anemia, unspecified iron deficiency anemia type -     CBC with Differential/Platelet  Primary hypertension Assessment & Plan: BP Readings from Last 3 Encounters:  07/07/22 (!) 158/59  03/14/22 (!) 138/45  12/06/21 118/75   Uncontrolled. D/c metoprolol, start carvedilol 25mg  bid. Continue hydralazine and hctz.    Dysuria -     Urine Culture  Adnexal cyst Assessment & Plan: Noted on MR Lumbar spine. Will obtain dedicated US for further evaluation.   Orders: -     US PELVIC COMPLETE WITH TRANSVAGINAL; Future  Thyromegaly Assessment & Plan: Has [place] in 2022 which noted the following:   IMPRESSION: Minor thyroid heterogeneity and subcentimeter cystic nodule, as above   No  other significant finding by ultrasound.   Other orders -     Carvedilol; Take 1 tablet (25 mg total) by mouth 2 (two) times daily with a meal.  Dispense: 60 tablet; Refill: 3     I,Rachel Rivera,acting as a scribe for Lemont Fillers, NP.,have documented all relevant documentation on the behalf of Lemont Fillers, NP,as directed by  Lemont Fillers, NP while in the presence of Lemont Fillers, NP.   I, Lemont Fillers, NP, personally preformed the services described in this documentation.  All medical record entries made by the scribe were at my direction and in my presence.  I have reviewed the chart and discharge instructions (if applicable) and agree that the record reflects my personal performance and is accurate and complete. 07/07/22   Lemont Fillers, NP

## 2022-07-07 ENCOUNTER — Ambulatory Visit (INDEPENDENT_AMBULATORY_CARE_PROVIDER_SITE_OTHER): Payer: Medicare Other | Admitting: Family

## 2022-07-07 VITALS — BP 158/59 | HR 86 | Temp 97.7°F | Resp 16 | Ht 62.0 in | Wt 192.0 lb

## 2022-07-07 DIAGNOSIS — E010 Iodine-deficiency related diffuse (endemic) goiter: Secondary | ICD-10-CM

## 2022-07-07 DIAGNOSIS — I10 Essential (primary) hypertension: Secondary | ICD-10-CM

## 2022-07-07 DIAGNOSIS — E119 Type 2 diabetes mellitus without complications: Secondary | ICD-10-CM

## 2022-07-07 DIAGNOSIS — Z01818 Encounter for other preprocedural examination: Secondary | ICD-10-CM | POA: Insufficient documentation

## 2022-07-07 DIAGNOSIS — N949 Unspecified condition associated with female genital organs and menstrual cycle: Secondary | ICD-10-CM

## 2022-07-07 DIAGNOSIS — D509 Iron deficiency anemia, unspecified: Secondary | ICD-10-CM

## 2022-07-07 DIAGNOSIS — R300 Dysuria: Secondary | ICD-10-CM

## 2022-07-07 LAB — COMPREHENSIVE METABOLIC PANEL
ALT: 18 U/L (ref 0–35)
AST: 17 U/L (ref 0–37)
Albumin: 4.2 g/dL (ref 3.5–5.2)
Alkaline Phosphatase: 59 U/L (ref 39–117)
BUN: 15 mg/dL (ref 6–23)
CO2: 28 mEq/L (ref 19–32)
Calcium: 9.7 mg/dL (ref 8.4–10.5)
Chloride: 100 mEq/L (ref 96–112)
Creatinine, Ser: 1.04 mg/dL (ref 0.40–1.20)
GFR: 54.13 mL/min — ABNORMAL LOW (ref >60.00)
Glucose, Bld: 162 mg/dL — ABNORMAL HIGH (ref 70–99)
Potassium: 4.1 mEq/L (ref 3.5–5.1)
Sodium: 141 mEq/L (ref 135–145)
Total Bilirubin: 0.4 mg/dL (ref 0.2–1.2)
Total Protein: 7.1 g/dL (ref 6.0–8.3)

## 2022-07-07 LAB — CBC WITH DIFFERENTIAL/PLATELET
Basophils Absolute: 0 10*3/uL (ref 0.0–0.1)
Basophils Relative: 0.3 % (ref 0.0–3.0)
Eosinophils Absolute: 0.2 10*3/uL (ref 0.0–0.7)
Eosinophils Relative: 3.4 % (ref 0.0–5.0)
HCT: 36.9 % (ref 36.0–46.0)
Hemoglobin: 12 g/dL (ref 12.0–15.0)
Lymphocytes Relative: 42.5 % (ref 12.0–46.0)
Lymphs Abs: 2.2 10*3/uL (ref 0.7–4.0)
MCHC: 32.6 g/dL (ref 30.0–36.0)
MCV: 86.4 fl (ref 78.0–100.0)
Monocytes Absolute: 0.5 10*3/uL (ref 0.1–1.0)
Monocytes Relative: 10.7 % (ref 3.0–12.0)
Neutro Abs: 2.2 10*3/uL (ref 1.4–7.7)
Neutrophils Relative %: 43.1 % (ref 43.0–77.0)
Platelets: 190 10*3/uL (ref 150.0–400.0)
RBC: 4.27 Mil/uL (ref 3.87–5.11)
RDW: 14.5 % (ref 11.5–15.5)
WBC: 5.1 10*3/uL (ref 4.0–10.5)

## 2022-07-07 LAB — APTT: aPTT: 27.2 s (ref 25.4–36.8)

## 2022-07-07 LAB — HEMOGLOBIN A1C: Hgb A1c MFr Bld: 7 % — ABNORMAL HIGH (ref 4.6–6.5)

## 2022-07-07 MED ORDER — CARVEDILOL 25 MG PO TABS: 25.0000 mg | ORAL_TABLET | Freq: Two times a day (BID) | ORAL | 3 refills | Status: DC

## 2022-07-07 NOTE — Assessment & Plan Note (Signed)
Has [place] in 2022 which noted the following:   IMPRESSION: Minor thyroid heterogeneity and subcentimeter cystic nodule, as above   No other significant finding by ultrasound.

## 2022-07-07 NOTE — Assessment & Plan Note (Signed)
Noted on MR Lumbar spine. Will obtain dedicated US for further evaluation.

## 2022-07-07 NOTE — Assessment & Plan Note (Signed)
Lab Results  Component Value Date   HGBA1C 6.8 (H) 03/14/2022   HGBA1C 7.0 (H) 12/06/2021   HGBA1C 6.8 (H) 09/06/2021   Lab Results  Component Value Date   MICROALBUR 2.4 (H) 12/06/2021   LDLCALC 11 05/13/2021   CREATININE 1.05 03/14/2022   Diet controlled.

## 2022-07-07 NOTE — Assessment & Plan Note (Signed)
CXR/labs as ordered.  EKG tracing is personally reviewed.  EKG notes NSR.  No acute changes.

## 2022-07-07 NOTE — Assessment & Plan Note (Addendum)
BP Readings from Last 3 Encounters:  07/07/22 (!) 158/59  03/14/22 (!) 138/45  12/06/21 118/75   Uncontrolled. D/c metoprolol, start carvedilol 25mg  bid. Continue hydralazine and hctz.

## 2022-07-08 LAB — URINE CULTURE
MICRO NUMBER:: 14940965
Result:: NO GROWTH
SPECIMEN QUALITY:: ADEQUATE

## 2022-07-11 ENCOUNTER — Telehealth: Payer: Self-pay | Admitting: Family

## 2022-07-11 ENCOUNTER — Ambulatory Visit (HOSPITAL_BASED_OUTPATIENT_CLINIC_OR_DEPARTMENT_OTHER)
Admission: RE | Admit: 2022-07-11 | Discharge: 2022-07-11 | Disposition: A | Discharge status: Home / Self Care | Payer: Medicare Other | Source: Ambulatory Visit | Attending: Family | Admitting: Family

## 2022-07-11 ENCOUNTER — Ambulatory Visit (INDEPENDENT_AMBULATORY_CARE_PROVIDER_SITE_OTHER): Payer: Medicare Other

## 2022-07-11 DIAGNOSIS — Z01818 Encounter for other preprocedural examination: Secondary | ICD-10-CM | POA: Diagnosis not present

## 2022-07-11 DIAGNOSIS — I10 Essential (primary) hypertension: Secondary | ICD-10-CM | POA: Diagnosis not present

## 2022-07-11 NOTE — Progress Notes (Signed)
Pt here for Blood pressure check per Sandford Craze: 07/07/22- "Return in about 3 days (around 07/10/2022) for nurse visit blood pressure check."  Pt currently takes: (D/c Metoprolol at last OV) Started carvedilol 25mg  bid Continued hydralazine 25mg  and hctz 25mg .   Pt reports compliance with medication.  BP today @ = 132/60 HR = 75  Pts says she has been getting readings 143-160 systolic at home.  Pt advised per Melissa: Continue current medication regimen, f/u in 3 months, bring BP cuff to check for accuracy. Have CXR downstairs today for preop clearance. Pt aware and voices understanding. F/u has been scheduled.

## 2022-07-11 NOTE — Telephone Encounter (Signed)
Patient would like to complete chest x-ray in [place]

## 2022-07-12 ENCOUNTER — Telehealth: Payer: Self-pay | Admitting: Family

## 2022-07-12 DIAGNOSIS — Z01818 Encounter for other preprocedural examination: Secondary | ICD-10-CM

## 2022-07-12 NOTE — Telephone Encounter (Signed)
Unfortunately I will need her to return for INR check as it did not get run with the prior labs.

## 2022-07-14 ENCOUNTER — Other Ambulatory Visit (HOSPITAL_BASED_OUTPATIENT_CLINIC_OR_DEPARTMENT_OTHER): Payer: Self-pay | Admitting: Family

## 2022-07-14 ENCOUNTER — Other Ambulatory Visit (INDEPENDENT_AMBULATORY_CARE_PROVIDER_SITE_OTHER): Payer: Medicare Other

## 2022-07-14 DIAGNOSIS — H52223 Regular astigmatism, bilateral: Secondary | ICD-10-CM | POA: Diagnosis not present

## 2022-07-14 DIAGNOSIS — H524 Presbyopia: Secondary | ICD-10-CM | POA: Diagnosis not present

## 2022-07-14 DIAGNOSIS — Z1231 Encounter for screening mammogram for malignant neoplasm of breast: Secondary | ICD-10-CM

## 2022-07-14 DIAGNOSIS — E119 Type 2 diabetes mellitus without complications: Secondary | ICD-10-CM | POA: Diagnosis not present

## 2022-07-14 DIAGNOSIS — Z01818 Encounter for other preprocedural examination: Secondary | ICD-10-CM

## 2022-07-14 DIAGNOSIS — H43823 Vitreomacular adhesion, bilateral: Secondary | ICD-10-CM | POA: Diagnosis not present

## 2022-07-14 LAB — HM DIABETES EYE EXAM

## 2022-07-14 LAB — PROTIME-INR
INR: 1.1 ratio — ABNORMAL HIGH (ref 0.8–1.0)
Prothrombin Time: 11.5 s (ref 9.6–13.1)

## 2022-07-14 LAB — APTT: aPTT: 26.5 s (ref 25.4–36.8)

## 2022-07-14 NOTE — Telephone Encounter (Signed)
Patient was scheduled to come in this afternoon for test

## 2022-07-19 ENCOUNTER — Other Ambulatory Visit: Payer: Self-pay | Admitting: Family

## 2022-07-19 ENCOUNTER — Other Ambulatory Visit: Payer: Self-pay | Admitting: Gastroenterology

## 2022-07-21 ENCOUNTER — Ambulatory Visit (HOSPITAL_BASED_OUTPATIENT_CLINIC_OR_DEPARTMENT_OTHER)
Admission: RE | Admit: 2022-07-21 | Discharge: 2022-07-21 | Disposition: A | Discharge status: Home / Self Care | Payer: Medicare Other | Source: Ambulatory Visit | Attending: Family | Admitting: Family

## 2022-07-21 DIAGNOSIS — N949 Unspecified condition associated with female genital organs and menstrual cycle: Secondary | ICD-10-CM | POA: Insufficient documentation

## 2022-07-25 ENCOUNTER — Inpatient Hospital Stay (HOSPITAL_BASED_OUTPATIENT_CLINIC_OR_DEPARTMENT_OTHER): Admission: RE | Admit: 2022-07-25 | Payer: Medicare Other | Source: Ambulatory Visit

## 2022-07-26 ENCOUNTER — Telehealth: Payer: Self-pay | Admitting: Family

## 2022-07-26 DIAGNOSIS — N83201 Unspecified ovarian cyst, right side: Secondary | ICD-10-CM

## 2022-07-26 NOTE — Telephone Encounter (Signed)
Please advise pt that her ultrasound shows cysts on both ovaries which have enlarged some since last check.  She also has some small fibroids.   I would like for her to meet with GYN.  Referral placed.

## 2022-07-27 NOTE — Telephone Encounter (Signed)
Patient notified of results, provider's comments and referral 

## 2022-08-02 ENCOUNTER — Telehealth: Payer: Self-pay | Admitting: Family

## 2022-08-02 NOTE — Telephone Encounter (Signed)
Lake Bells from [facility] asked for the pt most recent EKG to faxed over to them ([phone])  Fax number- [phone]

## 2022-08-02 NOTE — Telephone Encounter (Signed)
Called [place] Bells [facility] to make sure this is a request for [facility] upcoming [facility].  EKG faxed as requested

## 2022-08-03 DIAGNOSIS — M48062 Spinal stenosis, lumbar region with neurogenic claudication: Secondary | ICD-10-CM | POA: Diagnosis not present

## 2022-08-03 DIAGNOSIS — H2513 Age-related nuclear cataract, bilateral: Secondary | ICD-10-CM | POA: Diagnosis not present

## 2022-08-03 DIAGNOSIS — M5431 Sciatica, right side: Secondary | ICD-10-CM | POA: Diagnosis not present

## 2022-08-03 DIAGNOSIS — M5432 Sciatica, left side: Secondary | ICD-10-CM | POA: Diagnosis not present

## 2022-08-03 DIAGNOSIS — M5416 Radiculopathy, lumbar region: Secondary | ICD-10-CM | POA: Diagnosis not present

## 2022-08-04 DIAGNOSIS — R791 Abnormal coagulation profile: Secondary | ICD-10-CM | POA: Diagnosis not present

## 2022-08-04 DIAGNOSIS — M48062 Spinal stenosis, lumbar region with neurogenic claudication: Secondary | ICD-10-CM | POA: Diagnosis not present

## 2022-08-04 DIAGNOSIS — Z79899 Other long term (current) drug therapy: Secondary | ICD-10-CM | POA: Diagnosis not present

## 2022-08-04 DIAGNOSIS — M5432 Sciatica, left side: Secondary | ICD-10-CM | POA: Diagnosis not present

## 2022-08-04 DIAGNOSIS — M5431 Sciatica, right side: Secondary | ICD-10-CM | POA: Diagnosis not present

## 2022-08-04 DIAGNOSIS — Z419 Encounter for procedure for purposes other than remedying health state, unspecified: Secondary | ICD-10-CM | POA: Diagnosis not present

## 2022-08-09 DIAGNOSIS — M5116 Intervertebral disc disorders with radiculopathy, lumbar region: Secondary | ICD-10-CM | POA: Diagnosis not present

## 2022-08-09 DIAGNOSIS — M48062 Spinal stenosis, lumbar region with neurogenic claudication: Secondary | ICD-10-CM | POA: Diagnosis not present

## 2022-08-09 DIAGNOSIS — Z0189 Encounter for other specified special examinations: Secondary | ICD-10-CM | POA: Diagnosis not present

## 2022-08-09 DIAGNOSIS — Z79899 Other long term (current) drug therapy: Secondary | ICD-10-CM | POA: Diagnosis not present

## 2022-08-09 DIAGNOSIS — I10 Essential (primary) hypertension: Secondary | ICD-10-CM | POA: Diagnosis not present

## 2022-08-09 DIAGNOSIS — D649 Anemia, unspecified: Secondary | ICD-10-CM | POA: Diagnosis not present

## 2022-08-09 DIAGNOSIS — M5431 Sciatica, right side: Secondary | ICD-10-CM | POA: Diagnosis not present

## 2022-08-09 DIAGNOSIS — M5432 Sciatica, left side: Secondary | ICD-10-CM | POA: Diagnosis not present

## 2022-08-09 DIAGNOSIS — E119 Type 2 diabetes mellitus without complications: Secondary | ICD-10-CM | POA: Diagnosis not present

## 2022-08-09 DIAGNOSIS — M48061 Spinal stenosis, lumbar region without neurogenic claudication: Secondary | ICD-10-CM | POA: Diagnosis not present

## 2022-08-09 HISTORY — PX: BACK SURGERY: SHX140

## 2022-08-10 DIAGNOSIS — M48061 Spinal stenosis, lumbar region without neurogenic claudication: Secondary | ICD-10-CM | POA: Diagnosis not present

## 2022-08-10 DIAGNOSIS — E119 Type 2 diabetes mellitus without complications: Secondary | ICD-10-CM | POA: Diagnosis not present

## 2022-08-10 DIAGNOSIS — Z79899 Other long term (current) drug therapy: Secondary | ICD-10-CM | POA: Diagnosis not present

## 2022-08-10 DIAGNOSIS — D649 Anemia, unspecified: Secondary | ICD-10-CM | POA: Diagnosis not present

## 2022-08-10 DIAGNOSIS — M5116 Intervertebral disc disorders with radiculopathy, lumbar region: Secondary | ICD-10-CM | POA: Diagnosis not present

## 2022-08-10 DIAGNOSIS — I10 Essential (primary) hypertension: Secondary | ICD-10-CM | POA: Diagnosis not present

## 2022-08-12 DIAGNOSIS — M48061 Spinal stenosis, lumbar region without neurogenic claudication: Secondary | ICD-10-CM | POA: Diagnosis not present

## 2022-08-14 ENCOUNTER — Telehealth (HOSPITAL_BASED_OUTPATIENT_CLINIC_OR_DEPARTMENT_OTHER): Payer: Self-pay

## 2022-08-14 ENCOUNTER — Telehealth: Payer: Self-pay | Admitting: Family

## 2022-08-14 ENCOUNTER — Other Ambulatory Visit: Payer: Self-pay | Admitting: Family

## 2022-08-14 NOTE — Telephone Encounter (Signed)
Caller/Agency: Judie Grieve ([facility]) Callback Number: [phone], Ok to [facility] Requesting OT/PT/Skilled Nursing/Social Work/Speech Therapy: PT Frequency: 2 w 4, 1 w 4

## 2022-08-15 ENCOUNTER — Ambulatory Visit (HOSPITAL_BASED_OUTPATIENT_CLINIC_OR_DEPARTMENT_OTHER): Payer: Medicare Other

## 2022-08-15 NOTE — Telephone Encounter (Signed)
Verbal orders given to [facility]

## 2022-08-16 DIAGNOSIS — M48061 Spinal stenosis, lumbar region without neurogenic claudication: Secondary | ICD-10-CM | POA: Diagnosis not present

## 2022-08-17 DIAGNOSIS — M48061 Spinal stenosis, lumbar region without neurogenic claudication: Secondary | ICD-10-CM | POA: Diagnosis not present

## 2022-08-19 DIAGNOSIS — M48061 Spinal stenosis, lumbar region without neurogenic claudication: Secondary | ICD-10-CM | POA: Diagnosis not present

## 2022-08-23 DIAGNOSIS — M48061 Spinal stenosis, lumbar region without neurogenic claudication: Secondary | ICD-10-CM | POA: Diagnosis not present

## 2022-08-24 DIAGNOSIS — M48061 Spinal stenosis, lumbar region without neurogenic claudication: Secondary | ICD-10-CM | POA: Diagnosis not present

## 2022-08-25 DIAGNOSIS — M48061 Spinal stenosis, lumbar region without neurogenic claudication: Secondary | ICD-10-CM | POA: Diagnosis not present

## 2022-08-29 DIAGNOSIS — M48061 Spinal stenosis, lumbar region without neurogenic claudication: Secondary | ICD-10-CM | POA: Diagnosis not present

## 2022-08-30 DIAGNOSIS — M48061 Spinal stenosis, lumbar region without neurogenic claudication: Secondary | ICD-10-CM | POA: Diagnosis not present

## 2022-09-04 DIAGNOSIS — M48061 Spinal stenosis, lumbar region without neurogenic claudication: Secondary | ICD-10-CM | POA: Diagnosis not present

## 2022-09-07 DIAGNOSIS — I10 Essential (primary) hypertension: Secondary | ICD-10-CM | POA: Diagnosis not present

## 2022-09-07 DIAGNOSIS — Z4789 Encounter for other orthopedic aftercare: Secondary | ICD-10-CM | POA: Diagnosis not present

## 2022-09-07 DIAGNOSIS — Z79891 Long term (current) use of opiate analgesic: Secondary | ICD-10-CM | POA: Diagnosis not present

## 2022-09-07 DIAGNOSIS — Z7982 Long term (current) use of aspirin: Secondary | ICD-10-CM | POA: Diagnosis not present

## 2022-09-07 DIAGNOSIS — Z791 Long term (current) use of non-steroidal anti-inflammatories (NSAID): Secondary | ICD-10-CM | POA: Diagnosis not present

## 2022-09-07 DIAGNOSIS — M48061 Spinal stenosis, lumbar region without neurogenic claudication: Secondary | ICD-10-CM | POA: Diagnosis not present

## 2022-09-07 DIAGNOSIS — E119 Type 2 diabetes mellitus without complications: Secondary | ICD-10-CM | POA: Diagnosis not present

## 2022-09-08 ENCOUNTER — Encounter: Payer: Medicare Other | Admitting: Obstetrics and Gynecology

## 2022-09-12 DIAGNOSIS — M48062 Spinal stenosis, lumbar region with neurogenic claudication: Secondary | ICD-10-CM | POA: Diagnosis not present

## 2022-09-14 DIAGNOSIS — I10 Essential (primary) hypertension: Secondary | ICD-10-CM | POA: Diagnosis not present

## 2022-09-14 DIAGNOSIS — E119 Type 2 diabetes mellitus without complications: Secondary | ICD-10-CM | POA: Diagnosis not present

## 2022-09-14 DIAGNOSIS — Z79891 Long term (current) use of opiate analgesic: Secondary | ICD-10-CM | POA: Diagnosis not present

## 2022-09-14 DIAGNOSIS — Z791 Long term (current) use of non-steroidal anti-inflammatories (NSAID): Secondary | ICD-10-CM | POA: Diagnosis not present

## 2022-09-14 DIAGNOSIS — Z4789 Encounter for other orthopedic aftercare: Secondary | ICD-10-CM | POA: Diagnosis not present

## 2022-09-14 DIAGNOSIS — M48061 Spinal stenosis, lumbar region without neurogenic claudication: Secondary | ICD-10-CM | POA: Diagnosis not present

## 2022-09-14 DIAGNOSIS — Z7982 Long term (current) use of aspirin: Secondary | ICD-10-CM | POA: Diagnosis not present

## 2022-09-18 DIAGNOSIS — Z4789 Encounter for other orthopedic aftercare: Secondary | ICD-10-CM | POA: Diagnosis not present

## 2022-09-18 DIAGNOSIS — E119 Type 2 diabetes mellitus without complications: Secondary | ICD-10-CM | POA: Diagnosis not present

## 2022-09-18 DIAGNOSIS — Z79891 Long term (current) use of opiate analgesic: Secondary | ICD-10-CM | POA: Diagnosis not present

## 2022-09-18 DIAGNOSIS — Z7982 Long term (current) use of aspirin: Secondary | ICD-10-CM | POA: Diagnosis not present

## 2022-09-18 DIAGNOSIS — Z791 Long term (current) use of non-steroidal anti-inflammatories (NSAID): Secondary | ICD-10-CM | POA: Diagnosis not present

## 2022-09-18 DIAGNOSIS — I10 Essential (primary) hypertension: Secondary | ICD-10-CM | POA: Diagnosis not present

## 2022-09-18 DIAGNOSIS — M48061 Spinal stenosis, lumbar region without neurogenic claudication: Secondary | ICD-10-CM | POA: Diagnosis not present

## 2022-09-19 ENCOUNTER — Ambulatory Visit (INDEPENDENT_AMBULATORY_CARE_PROVIDER_SITE_OTHER): Payer: Medicare Other | Admitting: Family

## 2022-09-19 VITALS — BP 119/57 | HR 77 | Temp 97.5°F | Resp 16 | Wt 184.0 lb

## 2022-09-19 DIAGNOSIS — G8929 Other chronic pain: Secondary | ICD-10-CM

## 2022-09-19 DIAGNOSIS — M5441 Lumbago with sciatica, right side: Secondary | ICD-10-CM

## 2022-09-19 DIAGNOSIS — E119 Type 2 diabetes mellitus without complications: Secondary | ICD-10-CM | POA: Diagnosis not present

## 2022-09-19 DIAGNOSIS — Z7984 Long term (current) use of oral hypoglycemic drugs: Secondary | ICD-10-CM | POA: Diagnosis not present

## 2022-09-19 DIAGNOSIS — I10 Essential (primary) hypertension: Secondary | ICD-10-CM | POA: Diagnosis not present

## 2022-09-19 DIAGNOSIS — D649 Anemia, unspecified: Secondary | ICD-10-CM | POA: Diagnosis not present

## 2022-09-19 MED ORDER — METFORMIN HCL 500 MG PO TABS: 500.0000 mg | ORAL_TABLET | Freq: Two times a day (BID) | ORAL | 1 refills | Status: DC

## 2022-09-19 NOTE — Patient Instructions (Signed)
VISIT SUMMARY:  During your visit today, we discussed your recovery from recent surgery and your ongoing management of diabetes. You are making good progress in your recovery, gradually increasing your activity levels and reducing your reliance on a walker and brace. You have stopped taking hydrocodone due to side effects. We also discussed your diabetes management, as you were taken off metformin and gabapentin by your surgeon. Your last A1c level, checked in May, was seven.  YOUR PLAN:  -POST-SURGICAL RECOVERY: You are recovering well from your recent surgery. Continue with your physical and occupational therapy until your last session on Thursday. Gradually increase your activity as you feel able.  -TYPE 2 DIABETES MELLITUS: Your blood sugar levels have been slightly high. We will resume your Metformin medication at 500mg  daily and check your A1c levels at your next visit.  -HYPERTENSION: Your blood pressure was a bit high today. We will recheck it before you leave.  -ANEMIA AND HYPOCALCEMIA: Your last tests showed low levels of iron and calcium in your blood. We will check these levels again today.  INSTRUCTIONS:  Please continue with your physical and occupational therapy until your last session on Thursday. Gradually increase your activity as you feel able. We will resume your Metformin medication at 500mg  daily. We will recheck your blood pressure before you leave today. We will also check your iron and calcium levels today. Please follow up in 3 months (September).

## 2022-09-19 NOTE — Assessment & Plan Note (Signed)
Resolved following surgery.  Patient is recovering well from recent surgery. She is weaning off the walker and brace, and has stopped taking hydrocodone. She is experiencing fatigue and is using a commode due to difficulty with low seating. -Continue current physical therapy and occupational therapy until the last session on Thursday. -Encourage gradual increase in activity as tolerated.

## 2022-09-19 NOTE — Progress Notes (Signed)
Subjective:     Patient ID: Cassidy Harrell, female    DOB: 12-26-1950, 72 y.o.   MRN: [id number]  Chief Complaint  Patient presents with   Follow-up    Here for follow up after back surgery 08/09/22    HPI  Discussed the use of AI scribe software for clinical note transcription with the patient, who gave verbal consent to proceed.  History of Present Illness   The patient, with a history of diabetes and recent back surgery (L2-L5 laminectomy/decompression), reports significant improvement in her recovery. She has been participating in physical and occupational therapy, which will cease now that she has been cleared to drive. She no longer needs to wear her brace consistently and has been weaning off the walker. She occasionally takes muscle relaxers if she overdoes it during the day, but has stopped taking hydrocodone due to adverse effects. She reports that she tires easily, but is gradually increasing her activity level, including walking and planning to return to the gym. She is able to shower independently, using a seat, and continues to use a commode due to comfort. She also reports that the tingling sensation and feeling of walking on pins has resolved since the surgery.  Regarding her diabetes management, she reports that she was taken off metformin and gabapentin by her surgeon. She continues to take meloxicam, but is unsure of its efficacy. Her last A1c, checked in May, was seven.          Health Maintenance Due  Topic Date Due   Zoster Vaccines- Shingrix (2 of 2) 01/24/2022   COVID-19 Vaccine (6 - 2023-24 season) 03/28/2022   MAMMOGRAM  05/25/2022   Lung Cancer Screening  06/22/2022   FOOT EXAM  09/07/2022   Medicare Annual Wellness (AWV)  11/09/2022   Colonoscopy  12/30/2022    Past Medical History:  Diagnosis Date   Acute perforated appendicitis 04/02/2019   Anemia    Arthritis    Depression    Diabetes mellitus without complication (HCC)    Dysrhythmia     Over 10 years ago   Fatty liver    Gum disease    Heart murmur    History of syphilis    Hypertension    Irregular heart beat    Personal history of colonic polyps    Phlebitis    Pre-diabetes     Past Surgical History:  Procedure Laterality Date   CYSTECTOMY     between breasts   IR RADIOLOGIST EVAL & MGMT  04/15/2019   IR RADIOLOGIST EVAL & MGMT  04/29/2019   IR RADIOLOGIST EVAL & MGMT  05/08/2019   LAPAROSCOPIC APPENDECTOMY N/A 06/30/2019   Procedure: APPENDECTOMY LAPAROSCOPIC;  Surgeon: Axel Filler, MD;  Location: [facility] OR;  Service: General;  Laterality: N/A;   SHOULDER ARTHROSCOPY Right 12/2015   SPINE SURGERY  08/20/2015   bulging / herniated disc in C-spine   THERAPEUTIC ABORTION  1970. 1974, 1978    Family History  Problem Relation Age of Onset   Diabetes Mother    Pancreatic cancer Mother    Cancer Father        lung cancer   Kidney disease Father    Seizures Father    Colon cancer Brother    Heart attack Brother    Stroke Brother    Stomach cancer Neg Hx    Esophageal cancer Neg Hx    Rectal cancer Neg Hx     Social History   Socioeconomic History  Marital status: Divorced    Spouse name: Not on file   Number of children: 1   Years of education: Not on file   Highest education level: Associate degree: occupational, Scientist, product/process development, or vocational program  Occupational History   Not on file  Tobacco Use   Smoking status: Former    Current packs/day: 0.00    Average packs/day: 1 pack/day for 40.0 years (40.0 ttl pk-yrs)    Types: Cigarettes    Start date: 42    Quit date: 2011    Years since quitting: 13.5   Smokeless tobacco: Never  Vaping Use   Vaping status: Never Used  Substance and Sexual Activity   Alcohol use: No   Drug use: No   Sexual activity: Not on file  Other Topics Concern   Not on file  Social History Narrative   Regular exercise:  No   Caffeine Use:  No   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    Divorced   Son Age 68- alive and well   2 grand daughters- age 2 and an adopted Information systems manager age 34   Loves reading, walking spending time with family, 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 : Never true  Transportation Needs: No Transportation Needs (08/09/2022)   [facility]   Transportation    In the past 12 months, has lack of reliable transportation kept you from medical appointments, meetings, work or from getting things needed for daily living? : No  Physical Activity: Inactive (07/03/2022)   Exercise Vital Sign    Days of Exercise per Week: 0 days    Minutes of Exercise per Session: 0 min  Stress: No Stress Concern Present (07/03/2022)   [employer] of Occupational Health - Occupational Stress Questionnaire    Feeling of Stress : Not at all  Social Connections: Moderately Integrated (07/03/2022)   Social Connection and Isolation Panel [NHANES]    Frequency of Communication with Friends and Family: More than three times a week    Frequency of Social Gatherings with Friends and Family: Twice a week    Attends Religious Services: More than 4 times per year    Active Member of [employer] or Organizations: Yes    Attends Engineer, structural: More than 4 times per year    Marital Status: Divorced  Intimate Partner Violence: Not At Risk (11/08/2021)   Humiliation, Afraid, Rape, and Kick questionnaire     Fear of Current or Ex-Partner: No    Emotionally Abused: No    Physically Abused: No    Sexually Abused: No    Outpatient Medications Prior to Visit  Medication Sig Dispense Refill   acetaminophen (TYLENOL) 650 MG CR tablet Take 650 mg by mouth in the morning and at bedtime.     aspirin  EC 81 MG tablet Take 81 mg by mouth daily with lunch.     blood glucose meter kit and supplies KIT Dispense based on patient and insurance preference. Use up to four times daily as directed. (FOR ICD-9 250.00, 250.01). 1 each 0   calcium carbonate (OSCAL) 1500 (600 Ca) MG TABS tablet Take 1,200 mg by mouth daily with lunch. 800 units vitamin D     carvedilol (COREG) 25 MG tablet Take 1 tablet (25 mg total) by mouth 2 (two) times daily with a meal. 60 tablet 3   glucose blood (ONETOUCH VERIO) test strip Check blood sugars daily 100 each 12   hydrALAZINE (APRESOLINE) 25 MG tablet Take 1 tablet (25 mg total) by mouth 3 (three) times daily. 270 tablet 3   hydrochlorothiazide (HYDRODIURIL) 25 MG tablet TAKE 1 TABLET BY MOUTH DAILY 90 tablet 3   meloxicam (MOBIC) 7.5 MG tablet TAKE 1 TABLET BY MOUTH DAILY 90 tablet 3   Multiple Vitamins-Minerals (MULTIVITAMIN WITH MINERALS) tablet Take 1 tablet by mouth daily.     ondansetron (ZOFRAN) 4 MG tablet TAKE 1 TABLET BY MOUTH TWICE  DAILY AS NEEDED 180 tablet 0   pantoprazole (PROTONIX) 40 MG tablet TAKE 1 TABLET BY MOUTH DAILY 90 tablet 3   polyethylene glycol (MIRALAX / GLYCOLAX) 17 g packet Take 17 g by mouth daily as needed.     gabapentin (NEURONTIN) 300 MG capsule TAKE 1 CAPSULE BY MOUTH 3 TIMES  DAILY 270 capsule 1   No facility-administered medications prior to visit.    Allergies  Allergen Reactions   Aldactone [Spironolactone] Other (See Comments)    itching   Lisinopril     Throat itching / cough    ROS See HPI    Objective:    Physical Exam Constitutional:      Appearance: Normal appearance.  Cardiovascular:     Rate and Rhythm: Normal  rate.  Pulmonary:     Effort: No respiratory distress.  Neurological:     Mental Status: She is alert and oriented to person, place, and time.  Psychiatric:        Mood and Affect: Mood normal.        Behavior: Behavior normal.        Thought Content: Thought content normal.        Judgment: Judgment normal.      BP (!) 140/51 (BP Location: Left Arm, Patient Position: Sitting, Cuff Size: Large)   Pulse 77   Temp (!) 97.5 F (36.4 C) (Oral)   Resp 16   Wt 184 lb (83.5 kg)   SpO2 99%   BMI 33.65 kg/m  Wt Readings from Last 3 Encounters:  09/19/22 184 lb (83.5 kg)  07/07/22 192 lb (87.1 kg)  03/14/22 193 lb (87.5 kg)       Assessment & Plan:   Problem List Items Addressed This Visit       Unprioritized   Right-sided low back pain with right-sided sciatica    Resolved following surgery.  Patient is recovering well from recent surgery. She is weaning off the walker and brace, and has stopped taking hydrocodone. She is experiencing fatigue and is using a commode due to difficulty with low seating. -Continue current physical therapy and occupational therapy until the last session on Thursday. -Encourage gradual increase in activity as tolerated.      HTN (hypertension)    BP Readings from Last 3 Encounters:  09/19/22 (!) 140/51  07/07/22 (!) 158/59  03/14/22 Marland Kitchen)  138/45   Fair bp control.  Continue carvedilol/hydrochlorothiazide.       Diabetes type 2, controlled (HCC)    A1c was 7 in May. Patient was taken off metformin, but it is unclear why. Kidney function was normal at last check. -Resume Metformin 500mg  daily. -Check A1c at next visit.      Relevant Medications   metFORMIN (GLUCOPHAGE) 500 MG tablet   Other Relevant Orders   CBC w/Diff   Other Visit Diagnoses     Essential hypertension    -  Primary   Relevant Orders   Basic Metabolic Panel (BMET)   Anemia, unspecified type       Relevant Orders   CBC w/Diff   Iron, TIBC and Ferritin Panel        I have discontinued Peter D. Van's gabapentin. I am also having her start on metFORMIN. Additionally, I am having her maintain her acetaminophen, blood glucose meter kit and supplies, aspirin EC, calcium carbonate, multivitamin with minerals, polyethylene glycol, OneTouch Verio, hydrochlorothiazide, pantoprazole, meloxicam, carvedilol, hydrALAZINE, and ondansetron.  Meds ordered this encounter  Medications   metFORMIN (GLUCOPHAGE) 500 MG tablet    Sig: Take 1 tablet (500 mg total) by mouth 2 (two) times daily with a meal.    Dispense:  180 tablet    Refill:  1    Order Specific Question:   Supervising Provider    Answer:   Danise Edge A [[id number]]

## 2022-09-19 NOTE — Assessment & Plan Note (Addendum)
BP Readings from Last 3 Encounters:  09/19/22 (!) 119/57  07/07/22 (!) 158/59  03/14/22 (!) 138/45   BP looks good. Continue carvedilol/hydrochlorothiazide.

## 2022-09-19 NOTE — Assessment & Plan Note (Signed)
A1c was 7 in May. Patient was taken off metformin, but it is unclear why. Kidney function was normal at last check. -Resume Metformin 500mg  daily. -Check A1c at next visit.

## 2022-09-20 DIAGNOSIS — Z791 Long term (current) use of non-steroidal anti-inflammatories (NSAID): Secondary | ICD-10-CM | POA: Diagnosis not present

## 2022-09-20 DIAGNOSIS — I10 Essential (primary) hypertension: Secondary | ICD-10-CM | POA: Diagnosis not present

## 2022-09-20 DIAGNOSIS — M48061 Spinal stenosis, lumbar region without neurogenic claudication: Secondary | ICD-10-CM | POA: Diagnosis not present

## 2022-09-20 DIAGNOSIS — Z7982 Long term (current) use of aspirin: Secondary | ICD-10-CM | POA: Diagnosis not present

## 2022-09-20 DIAGNOSIS — E119 Type 2 diabetes mellitus without complications: Secondary | ICD-10-CM | POA: Diagnosis not present

## 2022-09-20 DIAGNOSIS — Z4789 Encounter for other orthopedic aftercare: Secondary | ICD-10-CM | POA: Diagnosis not present

## 2022-09-20 DIAGNOSIS — Z79891 Long term (current) use of opiate analgesic: Secondary | ICD-10-CM | POA: Diagnosis not present

## 2022-09-20 LAB — CBC WITH DIFFERENTIAL/PLATELET
Basophils Absolute: 0 10*3/uL (ref 0.0–0.1)
Basophils Relative: 0.4 % (ref 0.0–3.0)
Eosinophils Absolute: 0.3 10*3/uL (ref 0.0–0.7)
Eosinophils Relative: 5.7 % — ABNORMAL HIGH (ref 0.0–5.0)
HCT: 33.6 % — ABNORMAL LOW (ref 36.0–46.0)
Hemoglobin: 10.6 g/dL — ABNORMAL LOW (ref 12.0–15.0)
Lymphocytes Relative: 40.6 % (ref 12.0–46.0)
Lymphs Abs: 2.4 10*3/uL (ref 0.7–4.0)
MCHC: 31.7 g/dL (ref 30.0–36.0)
MCV: 86.5 fl (ref 78.0–100.0)
Monocytes Absolute: 0.8 10*3/uL (ref 0.1–1.0)
Monocytes Relative: 13.5 % — ABNORMAL HIGH (ref 3.0–12.0)
Neutro Abs: 2.3 10*3/uL (ref 1.4–7.7)
Neutrophils Relative %: 39.8 % — ABNORMAL LOW (ref 43.0–77.0)
Platelets: 242 10*3/uL (ref 150.0–400.0)
RBC: 3.88 Mil/uL (ref 3.87–5.11)
RDW: 14.6 % (ref 11.5–15.5)
WBC: 5.9 10*3/uL (ref 4.0–10.5)

## 2022-09-20 LAB — IRON,TIBC AND FERRITIN PANEL
%SAT: 10 % (calc) — ABNORMAL LOW (ref 16–45)
Ferritin: 85 ng/mL (ref 16–288)
Iron: 32 ug/dL — ABNORMAL LOW (ref 45–160)
TIBC: 321 mcg/dL (calc) (ref 250–450)

## 2022-09-20 LAB — BASIC METABOLIC PANEL
BUN: 20 mg/dL (ref 6–23)
CO2: 28 mEq/L (ref 19–32)
Calcium: 9.8 mg/dL (ref 8.4–10.5)
Chloride: 99 mEq/L (ref 96–112)
Creatinine, Ser: 1.11 mg/dL (ref 0.40–1.20)
GFR: 49.99 mL/min — ABNORMAL LOW (ref >60.00)
Glucose, Bld: 97 mg/dL (ref 70–99)
Potassium: 3.7 mEq/L (ref 3.5–5.1)
Sodium: 139 mEq/L (ref 135–145)

## 2022-09-21 ENCOUNTER — Telehealth: Payer: Self-pay | Admitting: Family

## 2022-09-21 DIAGNOSIS — D509 Iron deficiency anemia, unspecified: Secondary | ICD-10-CM

## 2022-09-21 MED ORDER — IRON (FERROUS SULFATE) 325 (65 FE) MG PO TABS
325.0000 mg | ORAL_TABLET | ORAL | Status: AC
Start: 2022-09-21 — End: ?

## 2022-09-21 NOTE — Telephone Encounter (Signed)
Iron level is low. Please add iron 325mg  otc one tab by mouth every other day.  Repeat iron studies in 6 weeks.

## 2022-09-22 NOTE — Telephone Encounter (Signed)
Patient made aware of results/recommendations.  Patient will schedule lab appointment when she comes in the office in a couple weeks.

## 2022-09-27 ENCOUNTER — Encounter (INDEPENDENT_AMBULATORY_CARE_PROVIDER_SITE_OTHER): Payer: Self-pay

## 2022-10-12 ENCOUNTER — Encounter (INDEPENDENT_AMBULATORY_CARE_PROVIDER_SITE_OTHER): Payer: Self-pay

## 2022-10-13 ENCOUNTER — Ambulatory Visit: Payer: Medicare Other | Admitting: Family

## 2022-10-13 ENCOUNTER — Encounter: Payer: Self-pay | Admitting: Family

## 2022-10-13 VITALS — BP 149/57 | HR 81 | Temp 97.7°F | Resp 16 | Wt 186.0 lb

## 2022-10-13 DIAGNOSIS — Z87891 Personal history of nicotine dependence: Secondary | ICD-10-CM | POA: Diagnosis not present

## 2022-10-13 DIAGNOSIS — D649 Anemia, unspecified: Secondary | ICD-10-CM | POA: Diagnosis not present

## 2022-10-13 DIAGNOSIS — M1990 Unspecified osteoarthritis, unspecified site: Secondary | ICD-10-CM

## 2022-10-13 DIAGNOSIS — Z7984 Long term (current) use of oral hypoglycemic drugs: Secondary | ICD-10-CM | POA: Diagnosis not present

## 2022-10-13 DIAGNOSIS — I10 Essential (primary) hypertension: Secondary | ICD-10-CM | POA: Diagnosis not present

## 2022-10-13 DIAGNOSIS — K219 Gastro-esophageal reflux disease without esophagitis: Secondary | ICD-10-CM | POA: Diagnosis not present

## 2022-10-13 DIAGNOSIS — K649 Unspecified hemorrhoids: Secondary | ICD-10-CM | POA: Insufficient documentation

## 2022-10-13 DIAGNOSIS — E119 Type 2 diabetes mellitus without complications: Secondary | ICD-10-CM | POA: Diagnosis not present

## 2022-10-13 MED ORDER — CARVEDILOL 25 MG PO TABS: 25.0000 mg | ORAL_TABLET | Freq: Two times a day (BID) | ORAL | 1 refills | Status: DC

## 2022-10-13 MED ORDER — METFORMIN HCL 500 MG PO TABS: 500.0000 mg | ORAL_TABLET | Freq: Two times a day (BID) | ORAL | 1 refills | Status: DC

## 2022-10-13 NOTE — Assessment & Plan Note (Signed)
  Worsening pain in multiple joints, particularly the right arm. Currently taking Tylenol 650mg . She was taking bid.  Advised her to increase to TID. -Continue Tylenol up to 3 times daily, ensuring total daily dose does not exceed 4000mg . -Discontinue Meloxicam due to reduced kidney function.

## 2022-10-13 NOTE — Progress Notes (Signed)
Subjective:     Patient ID: Cassidy Harrell, female    DOB: [birth year], 72 y.o.   MRN: [id number]  Chief Complaint  Patient presents with   Diabetes    Here for follow up   Hypertension    Here for follow up    HPI  Discussed the use of AI scribe software for clinical note transcription with the patient, who gave verbal consent to proceed.  History of Present Illness   The patient, with a history of hypertension, diabetes, and recent back surgery, presents for a follow-up visit. She reports that her blood pressure has been elevated, with a recent reading of 147, up from 142 at her last visit. She is currently taking hydralazine three times a day, hydrochlorothiazide once a day, and carvedilol 25 twice a day for blood pressure management. She did not take today as she was staying with her mother overnight and did not have her medication.  The patient also reports experiencing heartburn and believes she may have gastroesophageal reflux disease (GERD) again. She is currently taking pantoprazole daily for this issue.  In addition, the patient is suffering from severe arthritis, affecting multiple joints including her arm, knees, elbows, and ankles. She has increased her Tylenol intake to manage the pain, taking four tablets a day, sometimes three times a day.  The patient also reports rectal bleeding, which she believes may be due to hemorrhoids. She has not experienced any pain with the bleeding and has noticed external hemorrhoids at times.  The patient also mentions changes in her taste preferences since her recent surgery and wonders if she may be lactose intolerant after experiencing digestive issues following the consumption of a milkshake.  Finally, the patient mentions that she has been taking meloxicam for a long time, but wonders if she still needs to continue taking it following her back surgery.     BP Readings from Last 3 Encounters:  10/13/22 (!) 149/57  09/19/22 (!)  119/57  07/07/22 (!) 158/59        Health Maintenance Due  Topic Date Due   Zoster Vaccines- Shingrix (2 of 2) 01/24/2022   COVID-19 Vaccine (6 - 2023-24 season) 03/28/2022   MAMMOGRAM  05/25/2022   Lung Cancer Screening  06/22/2022   INFLUENZA VACCINE  09/28/2022   Medicare Annual Wellness (AWV)  11/09/2022   Colonoscopy  12/30/2022    Past Medical History:  Diagnosis Date   Acute perforated appendicitis 04/02/2019   Anemia    Arthritis    Depression    Diabetes mellitus without complication (HCC)    Dysrhythmia    Over 10 years ago   Fatty liver    Gum disease    Heart murmur    History of syphilis    Hypertension    Irregular heart beat    Personal history of colonic polyps    Phlebitis    Pre-diabetes     Past Surgical History:  Procedure Laterality Date   CYSTECTOMY     between breasts   IR RADIOLOGIST EVAL & MGMT  04/15/2019   IR RADIOLOGIST EVAL & MGMT  04/29/2019   IR RADIOLOGIST EVAL & MGMT  05/08/2019   LAPAROSCOPIC APPENDECTOMY N/A 06/30/2019   Procedure: APPENDECTOMY LAPAROSCOPIC;  Surgeon: Axel Filler, MD;  Location: [facility] OR;  Service: General;  Laterality: N/A;   SHOULDER ARTHROSCOPY Right 12/2015   SPINE SURGERY  08/20/2015   bulging / herniated disc in C-spine   THERAPEUTIC ABORTION  1970. 1974, 1978  Family History  Problem Relation Age of Onset   Diabetes Mother    Pancreatic cancer Mother    Cancer Father        lung cancer   Kidney disease Father    Seizures Father    Colon cancer Brother    Heart attack Brother    Stroke Brother    Stomach cancer Neg Hx    Esophageal cancer Neg Hx    Rectal cancer Neg Hx     Social History   Socioeconomic History   Marital status: Divorced    Spouse name: Not on file   Number of children: 1   Years of education: Not on file   Highest education level: Associate degree: occupational, Scientist, product/process development, or vocational program  Occupational History   Not on file  Tobacco Use   Smoking status:  Former    Current packs/day: 0.00    Average packs/day: 1 pack/day for 40.0 years (40.0 ttl pk-yrs)    Types: Cigarettes    Start date: 30    Quit date: 2011    Years since quitting: 13.6   Smokeless tobacco: Never  Vaping Use   Vaping status: Never Used  Substance and Sexual Activity   Alcohol use: No   Drug use: No   Sexual activity: Not on file  Other Topics Concern   Not on file  Social History Narrative   Regular exercise:  No   Caffeine Use:  No   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    Divorced   Son Age 76- alive and well   2 grand daughters- age 52 and an adopted Information systems manager age 22   Loves reading, walking spending time with family, 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 : Never true  Transportation Needs: No Transportation Needs (08/09/2022)   [facility]   Transportation    In the past 12 months, has lack of reliable transportation kept you from medical appointments, meetings, work or from getting things needed for daily living? : No  Physical Activity: Inactive (07/03/2022)   Exercise Vital Sign    Days of Exercise per Week: 0 days    Minutes of Exercise per Session: 0 min  Stress: No Stress Concern Present (07/03/2022)   [employer] of Occupational Health - Occupational Stress Questionnaire    Feeling of Stress : Not at all  Social Connections:  Moderately Integrated (07/03/2022)   Social Connection and Isolation Panel [NHANES]    Frequency of Communication with Friends and Family: More than three times a week    Frequency of Social Gatherings with Friends and Family: Twice a week    Attends Religious Services: More than 4 times per year    Active Member of [employer] or Organizations: Yes    Attends Banker Meetings:  More than 4 times per year    Marital Status: Divorced  Intimate Partner Violence: Not At Risk (11/08/2021)   Humiliation, Afraid, Rape, and Kick questionnaire    Fear of Current or Ex-Partner: No    Emotionally Abused: No    Physically Abused: No    Sexually Abused: No    Outpatient Medications Prior to Visit  Medication Sig Dispense Refill   acetaminophen (TYLENOL) 650 MG CR tablet Take 650 mg by mouth in the morning and at bedtime.     aspirin EC 81 MG tablet Take 81 mg by mouth daily with lunch.     blood glucose meter kit and supplies KIT Dispense based on patient and insurance preference. Use up to four times daily as directed. (FOR ICD-9 250.00, 250.01). 1 each 0   calcium carbonate (OSCAL) 1500 (600 Ca) MG TABS tablet Take 1,200 mg by mouth daily with lunch. 800 units vitamin D     glucose blood (ONETOUCH VERIO) test strip Check blood sugars daily 100 each 12   hydrALAZINE (APRESOLINE) 25 MG tablet Take 1 tablet (25 mg total) by mouth 3 (three) times daily. 270 tablet 3   hydrochlorothiazide (HYDRODIURIL) 25 MG tablet TAKE 1 TABLET BY MOUTH DAILY 90 tablet 3   Iron, Ferrous Sulfate, 325 (65 Fe) MG TABS Take 325 mg by mouth every other day.     Multiple Vitamins-Minerals (MULTIVITAMIN WITH MINERALS) tablet Take 1 tablet by mouth daily.     ondansetron (ZOFRAN) 4 MG tablet TAKE 1 TABLET BY MOUTH TWICE  DAILY AS NEEDED 180 tablet 0   pantoprazole (PROTONIX) 40 MG tablet TAKE 1 TABLET BY MOUTH DAILY 90 tablet 3   polyethylene glycol (MIRALAX / GLYCOLAX) 17 g packet Take 17 g by mouth daily as needed.      carvedilol (COREG) 25 MG tablet Take 1 tablet (25 mg total) by mouth 2 (two) times daily with a meal. 60 tablet 3   meloxicam (MOBIC) 7.5 MG tablet TAKE 1 TABLET BY MOUTH DAILY 90 tablet 3   metFORMIN (GLUCOPHAGE) 500 MG tablet Take 1 tablet (500 mg total) by mouth 2 (two) times daily with a meal. 180 tablet 1   No facility-administered medications prior to visit.    Allergies  Allergen Reactions   Aldactone [Spironolactone] Other (See Comments)    itching   Lisinopril     Throat itching / cough    ROS See HPI    Objective:    Physical Exam Constitutional:      General: She is not in acute distress.    Appearance: Normal appearance. She is well-developed.  HENT:     Head: Normocephalic and atraumatic.     Right Ear: External ear normal.     Left Ear: External ear normal.  Eyes:     General: No scleral icterus. Neck:     Thyroid: No thyromegaly.  Cardiovascular:     Rate and Rhythm: Normal rate and regular rhythm.     Heart sounds: Normal heart sounds. No murmur heard. Pulmonary:     Effort: Pulmonary effort is normal. No respiratory distress.     Breath sounds: Normal breath sounds. No wheezing.  Genitourinary:    Comments: + external hemorrhoid noted Musculoskeletal:     Cervical back: Neck supple.  Skin:    General: Skin is warm and dry.  Neurological:     Mental Status: She is alert and oriented to person, place, and time.  Psychiatric:  Mood and Affect: Mood normal.        Behavior: Behavior normal.        Thought Content: Thought content normal.        Judgment: Judgment normal.    Diabetic Foot Exam - Simple   Simple Foot Form Diabetic Foot exam was performed with the following findings: Yes 10/13/2022  1:44 PM  Visual Inspection No deformities, no ulcerations, no other skin breakdown bilaterally: Yes Sensation Testing Intact to touch and monofilament testing bilaterally: Yes Pulse Check Comments       BP (!) 149/57 (BP Location: Right  Arm, Patient Position: Sitting, Cuff Size: Large)   Pulse 81   Temp 97.7 F (36.5 C) (Oral)   Resp 16   Wt 186 lb (84.4 kg)   SpO2 99%   BMI 34.02 kg/m  Wt Readings from Last 3 Encounters:  10/13/22 186 lb (84.4 kg)  09/19/22 184 lb (83.5 kg)  07/07/22 192 lb (87.1 kg)       Assessment & Plan:   Problem List Items Addressed This Visit       Unprioritized   HTN (hypertension)    BP above goal. Recommend that she take her medication when she gets home and then send me an updated reading at home this evening.   Continue current meds.       Relevant Medications   carvedilol (COREG) 25 MG tablet   Hemorrhoids    Has noted some blood on the tissue- had recent episode of diarrhea.  Advised use of otc hemorrhoid ointment such as preparation H bid for 1 week. Call me if persistent bleeding after that week of treatment. Had normal colo 2021.       Relevant Medications   carvedilol (COREG) 25 MG tablet   GERD (gastroesophageal reflux disease)    Uncontrolled despite protonix.   Patient reports ongoing symptoms despite daily Pantoprazole. Recent change in dietary preferences and intolerance to dairy products noted. -Advise small, frequent meals and avoidance of eating 1.5 hours before bedtime. -Consider lactose intolerance and advise use of Lactaid with dairy products.       Diabetes type 2, controlled (HCC) - Primary    Lab Results  Component Value Date   HGBA1C 7.0 (H) 07/07/2022   Maintained on metformin, update A1C.       Relevant Medications   metFORMIN (GLUCOPHAGE) 500 MG tablet   Other Relevant Orders   HgB A1c   Arthritis     Worsening pain in multiple joints, particularly the right arm. Currently taking Tylenol 650mg . She was taking bid.  Advised her to increase to TID. -Continue Tylenol up to 3 times daily, ensuring total daily dose does not exceed 4000mg . -Discontinue Meloxicam due to reduced kidney function.       Other Visit Diagnoses     Anemia,  unspecified type       Relevant Orders   CBC w/Diff   History of tobacco abuse       Relevant Orders   CT CHEST LUNG CA SCREEN LOW DOSE W/O CM       I have discontinued Amran D. Medearis's meloxicam. I am also having her maintain her acetaminophen, blood glucose meter kit and supplies, aspirin EC, calcium carbonate, multivitamin with minerals, polyethylene glycol, OneTouch Verio, hydrochlorothiazide, pantoprazole, hydrALAZINE, ondansetron, Iron (Ferrous Sulfate), carvedilol, and metFORMIN.  Meds ordered this encounter  Medications   carvedilol (COREG) 25 MG tablet    Sig: Take 1 tablet (25 mg total) by mouth 2 (  two) times daily with a meal.    Dispense:  180 tablet    Refill:  1    Order Specific Question:   Supervising Provider    Answer:   Danise Edge A [[id number]]   metFORMIN (GLUCOPHAGE) 500 MG tablet    Sig: Take 1 tablet (500 mg total) by mouth 2 (two) times daily with a meal.    Dispense:  180 tablet    Refill:  1    Order Specific Question:   Supervising Provider    Answer:   Danise Edge A [[id number]]

## 2022-10-13 NOTE — Assessment & Plan Note (Signed)
BP above goal. Recommend that she take her medication when she gets home and then send me an updated reading at home this evening.   Continue current meds.

## 2022-10-13 NOTE — Assessment & Plan Note (Signed)
Has noted some blood on the tissue- had recent episode of diarrhea.  Advised use of otc hemorrhoid ointment such as preparation H bid for 1 week. Call me if persistent bleeding after that week of treatment. Had normal colo 2021.

## 2022-10-13 NOTE — Assessment & Plan Note (Signed)
Lab Results  Component Value Date   HGBA1C 7.0 (H) 07/07/2022   Maintained on metformin, update A1C.

## 2022-10-13 NOTE — Assessment & Plan Note (Signed)
Uncontrolled despite protonix.   Patient reports ongoing symptoms despite daily Pantoprazole. Recent change in dietary preferences and intolerance to dairy products noted. -Advise small, frequent meals and avoidance of eating 1.5 hours before bedtime. -Consider lactose intolerance and advise use of Lactaid with dairy products.

## 2022-10-13 NOTE — Patient Instructions (Signed)
VISIT SUMMARY:  During your recent visit, we discussed your elevated blood pressure, symptoms of heartburn, severe arthritis, rectal bleeding, and changes in your taste preferences. We also reviewed your current medications and made some adjustments to better manage your conditions.  YOUR PLAN:  -HYPERTENSION: Your blood pressure is higher than normal. Please monitor your blood pressure at home this evening and send the readings to [place] via MyChart.  -GASTROESOPHAGEAL REFLUX DISEASE (GERD): You're experiencing heartburn, which may be due to GERD. Try eating smaller, more frequent meals and avoid eating 1.5 hours before bedtime. Also, consider using Lactaid with dairy products as you may be lactose intolerant.  -ARTHRITIS: Your arthritis is causing severe pain in multiple joints. Continue taking Tylenol up to 3 times daily, but make sure not to exceed a total daily dose of 4000mg . Stop taking Meloxicam due to its potential to harm your kidneys.  -RECTAL BLEEDING: Your rectal bleeding is likely due to hemorrhoids. Use over-the-counter hemorrhoid cream twice daily for one week. If the bleeding continues after one week, please notify the clinic.  INSTRUCTIONS:  We have ordered a lung cancer screening due to your smoking history and a repeat CBC to monitor for anemia. We also recommend transitioning your prescriptions to [facility] home delivery for 90-day supplies. Please continue to monitor your health and report any significant changes.

## 2022-10-13 NOTE — Assessment & Plan Note (Signed)
Update cbc today.

## 2022-10-14 LAB — HEMOGLOBIN A1C
Hgb A1c MFr Bld: 6.8 %{Hb} — ABNORMAL HIGH (ref <5.7)
Mean Plasma Glucose: 148 mg/dL
eAG (mmol/L): 8.2 mmol/L

## 2022-10-14 LAB — CBC WITH DIFFERENTIAL/PLATELET
Absolute Monocytes: 609 {cells}/uL (ref 200–950)
Basophils Absolute: 29 {cells}/uL (ref 0–200)
Basophils Relative: 0.5 %
Eosinophils Absolute: 400 {cells}/uL (ref 15–500)
Eosinophils Relative: 6.9 %
HCT: 34 % — ABNORMAL LOW (ref 35.0–45.0)
Hemoglobin: 11 g/dL — ABNORMAL LOW (ref 11.7–15.5)
Lymphs Abs: 2285 {cells}/uL (ref 850–3900)
MCH: 26.8 pg — ABNORMAL LOW (ref 27.0–33.0)
MCHC: 32.4 g/dL (ref 32.0–36.0)
MCV: 82.7 fL (ref 80.0–100.0)
MPV: 12.8 fL — ABNORMAL HIGH (ref 7.5–12.5)
Monocytes Relative: 10.5 %
Neutro Abs: 2477 {cells}/uL (ref 1500–7800)
Neutrophils Relative %: 42.7 %
Platelets: 229 10*3/uL (ref 140–400)
RBC: 4.11 10*6/uL (ref 3.80–5.10)
RDW: 13.5 % (ref 11.0–15.0)
Total Lymphocyte: 39.4 %
WBC: 5.8 10*3/uL (ref 3.8–10.8)

## 2022-10-29 ENCOUNTER — Other Ambulatory Visit: Payer: Self-pay | Admitting: Family

## 2022-11-01 ENCOUNTER — Ambulatory Visit (INDEPENDENT_AMBULATORY_CARE_PROVIDER_SITE_OTHER): Payer: Medicare Other | Admitting: Family

## 2022-11-01 ENCOUNTER — Encounter: Payer: Self-pay | Admitting: Family

## 2022-11-01 VITALS — BP 140/60 | HR 72 | Temp 98.3°F | Resp 16 | Wt 181.0 lb

## 2022-11-01 DIAGNOSIS — E739 Lactose intolerance, unspecified: Secondary | ICD-10-CM | POA: Insufficient documentation

## 2022-11-01 DIAGNOSIS — I10 Essential (primary) hypertension: Secondary | ICD-10-CM | POA: Diagnosis not present

## 2022-11-01 DIAGNOSIS — E119 Type 2 diabetes mellitus without complications: Secondary | ICD-10-CM

## 2022-11-01 DIAGNOSIS — M5412 Radiculopathy, cervical region: Secondary | ICD-10-CM | POA: Diagnosis not present

## 2022-11-01 MED ORDER — METHYLPREDNISOLONE 4 MG PO TBPK: ORAL_TABLET | ORAL | 0 refills | Status: DC

## 2022-11-01 NOTE — Patient Instructions (Signed)
VISIT SUMMARY:  During your visit, we discussed your right arm pain that has been worsening. You described the pain as constant and sometimes radiating from the shoulder down to the hand, particularly severe under the right arm and in the wrist and elbow. You also mentioned a pins and needles sensation in your hand, mainly affecting the last three fingers. We also discussed your high blood pressure and lactose intolerance.  YOUR PLAN:  -RIGHT ARM PAIN AND PARESTHESIA: This is a condition where you feel pain and a pins and needles sensation in your right arm. We suspect it might be coming from your neck. We will start you on a short course of oral steroids to help reduce the inflammation and pain. You should continue taking your current muscle relaxants as needed.  -HYPERTENSION: This is a condition where your blood pressure is higher than normal. You should monitor your blood pressure at home and report the readings to me.  -LACTOSE INTOLERANCE: This is a condition where your body cannot digest lactose, a type of sugar found in milk and dairy products. You should try taking lactase supplements and consider lactose-free alternatives like almond milk.  INSTRUCTIONS:  While on steroids, please check your blood glucose twice daily and report if levels exceed 350. Also, monitor your blood pressure at home and report the readings to me. Try taking lactase supplements and consider lactose-free alternatives like almond milk for your lactose intolerance.
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
## 2022-11-01 NOTE — Assessment & Plan Note (Signed)
Pt advised to monitor sugars closely while taking medrol pak. Call if sugars >350.

## 2022-11-01 NOTE — Assessment & Plan Note (Signed)
  Elevated blood pressure noted during the visit. -Check blood pressure at home and report reading via mychart this evening. If still high at home, will adjust medication.

## 2022-11-01 NOTE — Progress Notes (Signed)
Subjective:     Patient ID: Cassidy Harrell, female    DOB: Sep 28, 1950, 72 y.o.   MRN: [id number]  Chief Complaint  Patient presents with   Arm Pain    Patient complains of right arm pain    Arm Pain  Associated symptoms include tingling.    Discussed the use of AI scribe software for clinical note transcription with the patient, who gave verbal consent to proceed.  72 year old female presents to the clinic today for right arm pain. Patient states that the pain starts in her shoulder and goes all the way down to her fingers. She states it is like a pins and needle type sensation in her hands and fingers. She is using ice packs and biofreeze at home with no relief. She states that when she is having to use the arm is hurts worse. She is sleeping at night with it propped up with a pillow. She thinks that the pain must have happened when she was helping move her mother. Known history of surgery on the same shoulder noted.    Pain started to get worse on Monday when she was packing up her mothers apartment.           Health Maintenance Due  Topic Date Due   MAMMOGRAM  05/25/2022   Lung Cancer Screening  06/22/2022   COVID-19 Vaccine (6 - 2023-24 season) 10/29/2022   Medicare Annual Wellness (AWV)  11/09/2022   Colonoscopy  12/30/2022    Past Medical History:  Diagnosis Date   Acute perforated appendicitis 04/02/2019   Anemia    Arthritis    Depression    Diabetes mellitus without complication (HCC)    Dysrhythmia    Over 10 years ago   Fatty liver    Gum disease    Heart murmur    History of syphilis    Hypertension    Irregular heart beat    Personal history of colonic polyps    Phlebitis    Pre-diabetes     Past Surgical History:  Procedure Laterality Date   CYSTECTOMY     between breasts   IR RADIOLOGIST EVAL & MGMT  04/15/2019   IR RADIOLOGIST EVAL & MGMT  04/29/2019   IR RADIOLOGIST EVAL & MGMT  05/08/2019   LAPAROSCOPIC APPENDECTOMY N/A 06/30/2019    Procedure: APPENDECTOMY LAPAROSCOPIC;  Surgeon: Axel Filler, MD;  Location: [facility];  Service: General;  Laterality: N/A;   SHOULDER ARTHROSCOPY Right 12/2015   SPINE SURGERY  08/20/2015   bulging / herniated disc in C-spine   THERAPEUTIC ABORTION  1970. 1974, 1978    Family History  Problem Relation Age of Onset   Diabetes Mother    Pancreatic cancer Mother    Cancer Father        lung cancer   Kidney disease Father    Seizures Father    Colon cancer Brother    Heart attack Brother    Stroke Brother    Stomach cancer Neg Hx    Esophageal cancer Neg Hx    Rectal cancer Neg Hx     Social History   Socioeconomic History   Marital status: Divorced    Spouse name: Not on file   Number of children: 1   Years of education: Not on file   Highest education level: Associate degree: occupational, Scientist, product/process development, or vocational program  Occupational History   Not on file  Tobacco Use   Smoking status: Former    Current  packs/day: 0.00    Average packs/day: 1 pack/day for 40.0 years (40.0 ttl pk-yrs)    Types: Cigarettes    Start date: 15    Quit date: 2011    Years since quitting: 13.6   Smokeless tobacco: Never  Vaping Use   Vaping status: Never Used  Substance and Sexual Activity   Alcohol use: No   Drug use: No   Sexual activity: Not on file  Other Topics Concern   Not on file  Social History Narrative   Regular exercise:  No   Caffeine Use:  No   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    Divorced   Son Age 13- alive and well   2 grand daughters- age 31 and an adopted Information systems manager age 17   Loves reading, walking spending time with family, 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            Social Determinants of Health   Financial Resource Strain: Low Risk  (07/03/2022)   Overall Financial Resource Strain (CARDIA)    Difficulty of Paying Living Expenses: Not very hard  Food Insecurity: Low Risk   (08/09/2022)   [facility]   Hunger Vital Sign    Worried About Running Out of Food in the Last Year: Never true    Ran Out of Food in the Last Year: Never true  Transportation Needs: No Transportation Needs (08/09/2022)   [facility]   Transportation    In the past 12 months, has lack of reliable transportation kept you from medical appointments, meetings, work or from getting things needed for daily living? : No  Physical Activity: Inactive (07/03/2022)   Exercise Vital Sign    Days of Exercise per Week: 0 days    Minutes of Exercise per Session: 0 min  Stress: No Stress Concern Present (07/03/2022)   [employer] of Occupational Health - Occupational Stress Questionnaire    Feeling of Stress : Not at all  Social Connections: Moderately Integrated (07/03/2022)   Social Connection and Isolation Panel [NHANES]    Frequency of Communication with Friends and Family: More than three times a week    Frequency of Social Gatherings with Friends and Family: Twice a week    Attends Religious Services: More than 4 times per year    Active Member of [employer] or Organizations: Yes    Attends Engineer, structural: More than 4 times per year    Marital Status: Divorced  Intimate Partner Violence: Not At Risk (11/08/2021)   Humiliation, Afraid, Rape, and Kick questionnaire    Fear of Current or Ex-Partner: No    Emotionally Abused: No    Physically Abused: No    Sexually Abused: No    Outpatient Medications Prior to Visit  Medication Sig Dispense Refill   acetaminophen (TYLENOL) 650 MG CR tablet Take 650 mg by mouth in the morning and at bedtime.     aspirin EC 81 MG tablet Take 81 mg by mouth daily with lunch.     blood glucose meter kit and supplies KIT Dispense based on patient and insurance preference. Use up to four times daily as directed. (FOR ICD-9 250.00, 250.01). 1 each 0   calcium carbonate (OSCAL) 1500 (600 Ca) MG TABS  tablet Take 1,200 mg by mouth daily with lunch. 800 units vitamin D     carvedilol (COREG) 25 MG tablet TAKE 1 TABLET (  25 MG TOTAL) BY MOUTH TWICE A DAY WITH MEALS 60 tablet 3   glucose blood (ONETOUCH VERIO) test strip Check blood sugars daily 100 each 12   hydrALAZINE (APRESOLINE) 25 MG tablet Take 1 tablet (25 mg total) by mouth 3 (three) times daily. 270 tablet 3   hydrochlorothiazide (HYDRODIURIL) 25 MG tablet TAKE 1 TABLET BY MOUTH DAILY 90 tablet 3   Iron, Ferrous Sulfate, 325 (65 Fe) MG TABS Take 325 mg by mouth every other day.     metFORMIN (GLUCOPHAGE) 500 MG tablet Take 1 tablet (500 mg total) by mouth 2 (two) times daily with a meal. 180 tablet 1   Multiple Vitamins-Minerals (MULTIVITAMIN WITH MINERALS) tablet Take 1 tablet by mouth daily.     ondansetron (ZOFRAN) 4 MG tablet TAKE 1 TABLET BY MOUTH TWICE  DAILY AS NEEDED 180 tablet 0   pantoprazole (PROTONIX) 40 MG tablet TAKE 1 TABLET BY MOUTH DAILY 90 tablet 3   polyethylene glycol (MIRALAX / GLYCOLAX) 17 g packet Take 17 g by mouth daily as needed.     No facility-administered medications prior to visit.    Allergies  Allergen Reactions   Aldactone [Spironolactone] Other (See Comments)    itching   Lisinopril     Throat itching / cough    Review of Systems  Musculoskeletal:  Positive for joint pain.       Pins and needles feeling in right arm. Pain noted in right shoulder  Neurological:  Positive for tingling.       Objective:    Physical Exam Constitutional:      Appearance: Normal appearance.  HENT:     Head: Normocephalic.  Cardiovascular:     Rate and Rhythm: Normal rate and regular rhythm.     Pulses: Normal pulses.     Heart sounds: Normal heart sounds.  Pulmonary:     Effort: Pulmonary effort is normal.     Breath sounds: Normal breath sounds.  Musculoskeletal:        General: Tenderness present.     Cervical back: Normal range of motion.     Comments: Noted in right arm   Skin:    General: Skin  is warm.     Capillary Refill: Capillary refill takes 2 to 3 seconds.  Neurological:     General: No focal deficit present.     Mental Status: She is alert and oriented to person, place, and time. Mental status is at baseline.  Psychiatric:        Mood and Affect: Mood normal.        Thought Content: Thought content normal.        Judgment: Judgment normal.      BP (!) 171/70 (BP Location: Left Arm, Patient Position: Sitting, Cuff Size: Large)   Pulse 72   Temp 98.3 F (36.8 C) (Oral)   Resp 16   Wt 181 lb (82.1 kg)   SpO2 100%   BMI 33.11 kg/m  Wt Readings from Last 3 Encounters:  11/01/22 181 lb (82.1 kg)  10/13/22 186 lb (84.4 kg)  09/19/22 184 lb (83.5 kg)       Assessment & Plan:   Problem List Items Addressed This Visit   None   I am having Ladell Pier maintain her acetaminophen, blood glucose meter kit and supplies, aspirin EC, calcium carbonate, multivitamin with minerals, polyethylene glycol, OneTouch Verio, hydrochlorothiazide, pantoprazole, hydrALAZINE, ondansetron, Iron (Ferrous Sulfate), metFORMIN, and carvedilol.  No orders of the defined types were  placed in this encounter.

## 2022-11-01 NOTE — Assessment & Plan Note (Signed)
  Reports severe intolerance to lactose. -Encouraged to try lactase supplements and consider lactose-free alternatives like almond milk.
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
## 2022-11-14 DIAGNOSIS — M5431 Sciatica, right side: Secondary | ICD-10-CM | POA: Diagnosis not present

## 2022-11-14 DIAGNOSIS — M5412 Radiculopathy, cervical region: Secondary | ICD-10-CM | POA: Diagnosis not present

## 2022-11-14 DIAGNOSIS — M48062 Spinal stenosis, lumbar region with neurogenic claudication: Secondary | ICD-10-CM | POA: Diagnosis not present

## 2022-11-14 DIAGNOSIS — M5432 Sciatica, left side: Secondary | ICD-10-CM | POA: Diagnosis not present

## 2022-11-15 ENCOUNTER — Encounter: Payer: Self-pay | Admitting: Pharmacist

## 2022-11-16 ENCOUNTER — Ambulatory Visit (HOSPITAL_BASED_OUTPATIENT_CLINIC_OR_DEPARTMENT_OTHER)
Admission: RE | Admit: 2022-11-16 | Discharge: 2022-11-16 | Disposition: A | Discharge status: Home / Self Care | Payer: Medicare Other | Source: Ambulatory Visit | Attending: Family | Admitting: Family

## 2022-11-16 DIAGNOSIS — Z87891 Personal history of nicotine dependence: Secondary | ICD-10-CM

## 2022-11-16 DIAGNOSIS — Z1231 Encounter for screening mammogram for malignant neoplasm of breast: Secondary | ICD-10-CM | POA: Diagnosis not present

## 2022-11-21 ENCOUNTER — Other Ambulatory Visit (HOSPITAL_BASED_OUTPATIENT_CLINIC_OR_DEPARTMENT_OTHER): Payer: Self-pay

## 2022-11-21 ENCOUNTER — Ambulatory Visit (INDEPENDENT_AMBULATORY_CARE_PROVIDER_SITE_OTHER): Payer: Medicare Other | Admitting: Family

## 2022-11-21 VITALS — BP 135/47 | HR 83 | Temp 97.5°F | Resp 16 | Wt 184.0 lb

## 2022-11-21 DIAGNOSIS — N949 Unspecified condition associated with female genital organs and menstrual cycle: Secondary | ICD-10-CM

## 2022-11-21 DIAGNOSIS — E119 Type 2 diabetes mellitus without complications: Secondary | ICD-10-CM | POA: Diagnosis not present

## 2022-11-21 DIAGNOSIS — Z1211 Encounter for screening for malignant neoplasm of colon: Secondary | ICD-10-CM

## 2022-11-21 DIAGNOSIS — M5412 Radiculopathy, cervical region: Secondary | ICD-10-CM | POA: Diagnosis not present

## 2022-11-21 MED ORDER — RSVPREF3 VAC RECOMB ADJUVANTED 120 MCG/0.5ML IM SUSR
0.5000 mL | Freq: Once | INTRAMUSCULAR | 0 refills | Status: AC
  Filled 2022-11-21: qty 0.5, 1d supply, fill #0

## 2022-11-21 MED ORDER — GABAPENTIN 100 MG PO CAPS
100.0000 mg | ORAL_CAPSULE | Freq: Three times a day (TID) | ORAL | 3 refills | Status: DC
  Filled 2022-11-21: qty 90, 30d supply, fill #0

## 2022-11-21 MED ORDER — CARVEDILOL 25 MG PO TABS: 25.0000 mg | ORAL_TABLET | Freq: Two times a day (BID) | ORAL | 1 refills | Status: DC

## 2022-11-21 NOTE — Progress Notes (Signed)
Subjective:     Patient ID: Cassidy Harrell, female    DOB: 08-Mar-1950, 72 y.o.   MRN: [id number]  Chief Complaint  Patient presents with   Cervical radiculopathy    Here for follow up, still having pain   Hypertension    Here for follow up    Hypertension    Discussed the use of AI scribe software for clinical note transcription with the patient, who gave verbal consent to proceed.  History of Present Illness   The patient, with a history of back surgery and cervical disc fusion, presents with persistent right arm pain. The pain, described as a pulling and twisting sensation, originates in the shoulder and radiates down to the fingers, causing a pins and needles sensation. The discomfort is most pronounced in the fourth and fifth finger. The patient reports some relief from a prednisone taper, but the pins and needles sensation persists. She also reports tenderness and popping in the neck, which she believes is related to the arm pain.  The patient recently had a follow-up with the surgeon who performed their back surgery. X-rays revealed three fused discs in the neck. She also reports a lack of strength in the affected arm, leading to frequent dropping of objects and difficulty sleeping due to pain.  In addition she is requesting that we reorder the pelvic ultrasound that was ordered this past spring to evaluate the enlarging adnexal cyst noted  on MRI Lumbar spine. She is also dealing with the emotional stress of caring for their mother, who has dementia.          Health Maintenance Due  Topic Date Due   Lung Cancer Screening  06/22/2022   Medicare Annual Wellness (AWV)  11/09/2022   Diabetic kidney evaluation - Urine ACR  12/07/2022   Colonoscopy  12/30/2022    Past Medical History:  Diagnosis Date   Acute perforated appendicitis 04/02/2019   Anemia    Arthritis    Depression    Diabetes mellitus without complication (HCC)    Dysrhythmia    Over 10 years ago    Fatty liver    Gum disease    Heart murmur    History of syphilis    Hypertension    Irregular heart beat    Personal history of colonic polyps    Phlebitis    Pre-diabetes     Past Surgical History:  Procedure Laterality Date   CYSTECTOMY     between breasts   IR RADIOLOGIST EVAL & MGMT  04/15/2019   IR RADIOLOGIST EVAL & MGMT  04/29/2019   IR RADIOLOGIST EVAL & MGMT  05/08/2019   LAPAROSCOPIC APPENDECTOMY N/A 06/30/2019   Procedure: APPENDECTOMY LAPAROSCOPIC;  Surgeon: Axel Filler, MD;  Location: [facility] OR;  Service: General;  Laterality: N/A;   SHOULDER ARTHROSCOPY Right 12/2015   SPINE SURGERY  08/20/2015   bulging / herniated disc in C-spine   THERAPEUTIC ABORTION  1970. 1974, 1978    Family History  Problem Relation Age of Onset   Diabetes Mother    Pancreatic cancer Mother    Cancer Father        lung cancer   Kidney disease Father    Seizures Father    Colon cancer Brother    Heart attack Brother    Stroke Brother    Stomach cancer Neg Hx    Esophageal cancer Neg Hx    Rectal cancer Neg Hx     Social History   Socioeconomic History  Marital status: Divorced    Spouse name: Not on file   Number of children: 1   Years of education: Not on file   Highest education level: Associate degree: occupational, Scientist, product/process development, or vocational program  Occupational History   Not on file  Tobacco Use   Smoking status: Former    Current packs/day: 0.00    Average packs/day: 1 pack/day for 40.0 years (40.0 ttl pk-yrs)    Types: Cigarettes    Start date: 2    Quit date: 2011    Years since quitting: 13.7   Smokeless tobacco: Never  Vaping Use   Vaping status: Never Used  Substance and Sexual Activity   Alcohol use: No   Drug use: No   Sexual activity: Not on file  Other Topics Concern   Not on file  Social History Narrative   Regular exercise:  No   Caffeine Use:  No   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     Divorced   Son Age 62- alive and well   2 grand daughters- age 23 and an adopted Information systems manager age 94   Loves reading, walking spending time with family, 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            Social Determinants of Health   Financial Resource Strain: Low Risk  (07/03/2022)   Overall Financial Resource Strain (CARDIA)    Difficulty of Paying Living Expenses: Not very hard  Food Insecurity: Low Risk  (08/09/2022)   [facility]   Hunger Vital Sign    Worried About Running Out of Food in the Last Year: Never true    Ran Out of Food in the Last Year: Never true  Transportation Needs: No Transportation Needs (08/09/2022)   [facility]   Transportation    In the past 12 months, has lack of reliable transportation kept you from medical appointments, meetings, work or from getting things needed for daily living? : No  Physical Activity: Unknown (07/03/2022)   Exercise Vital Sign    Days of Exercise per Week: 0 days    Minutes of Exercise per Session: Not on file  Recent Concern: Physical Activity - Inactive (07/03/2022)   Exercise Vital Sign    Days of Exercise per Week: 0 days    Minutes of Exercise per Session: 0 min  Stress: No Stress Concern Present (07/03/2022)   [employer] of Occupational Health - Occupational Stress Questionnaire    Feeling of Stress : Not at all  Social Connections: Moderately Integrated (07/03/2022)   Social Connection and Isolation Panel [NHANES]    Frequency of Communication with Friends and Family: More than three times a week    Frequency of Social Gatherings with Friends and Family: Twice a week    Attends Religious Services: More than 4 times per year    Active Member of [employer] or Organizations: Yes    Attends Engineer, structural: More than 4 times per year    Marital Status: Divorced  Intimate Partner Violence: Not At Risk (11/08/2021)   Humiliation, Afraid,  Rape, and Kick questionnaire    Fear of Current or Ex-Partner: No    Emotionally Abused: No    Physically Abused: No    Sexually Abused: No    Outpatient Medications Prior to Visit  Medication Sig Dispense Refill   acetaminophen (TYLENOL) 650 MG CR tablet Take 650 mg by mouth  in the morning and at bedtime.     aspirin EC 81 MG tablet Take 81 mg by mouth daily with lunch.     baclofen (LIORESAL) 10 MG tablet Take 10 mg by mouth 3 (three) times daily as needed.     blood glucose meter kit and supplies KIT Dispense based on patient and insurance preference. Use up to four times daily as directed. (FOR ICD-9 250.00, 250.01). 1 each 0   calcium carbonate (OSCAL) 1500 (600 Ca) MG TABS tablet Take 1,200 mg by mouth daily with lunch. 800 units vitamin D     glucose blood (ONETOUCH VERIO) test strip Check blood sugars daily 100 each 12   hydrALAZINE (APRESOLINE) 25 MG tablet Take 1 tablet (25 mg total) by mouth 3 (three) times daily. 270 tablet 3   hydrochlorothiazide (HYDRODIURIL) 25 MG tablet TAKE 1 TABLET BY MOUTH DAILY 90 tablet 3   HYDROcodone-acetaminophen (NORCO) 10-325 MG tablet Take 1 tablet by mouth every 4 (four) hours as needed.     Iron, Ferrous Sulfate, 325 (65 Fe) MG TABS Take 325 mg by mouth every other day.     metFORMIN (GLUCOPHAGE) 500 MG tablet Take 1 tablet (500 mg total) by mouth 2 (two) times daily with a meal. 180 tablet 1   methylPREDNISolone (MEDROL DOSEPAK) 4 MG TBPK tablet Take per package instructions 21 tablet 0   Multiple Vitamins-Minerals (MULTIVITAMIN WITH MINERALS) tablet Take 1 tablet by mouth daily.     ondansetron (ZOFRAN) 4 MG tablet TAKE 1 TABLET BY MOUTH TWICE  DAILY AS NEEDED 180 tablet 0   pantoprazole (PROTONIX) 40 MG tablet TAKE 1 TABLET BY MOUTH DAILY 90 tablet 3   polyethylene glycol (MIRALAX / GLYCOLAX) 17 g packet Take 17 g by mouth daily as needed.     carvedilol (COREG) 25 MG tablet TAKE 1 TABLET (25 MG TOTAL) BY MOUTH TWICE A DAY WITH MEALS 60 tablet  3   No facility-administered medications prior to visit.    Allergies  Allergen Reactions   Aldactone [Spironolactone] Other (See Comments)    itching   Lisinopril     Throat itching / cough    ROS     Objective:    Physical Exam Constitutional:      General: She is not in acute distress.    Appearance: Normal appearance. She is well-developed.  HENT:     Head: Normocephalic and atraumatic.     Right Ear: External ear normal.     Left Ear: External ear normal.  Eyes:     General: No scleral icterus. Neck:     Thyroid: No thyromegaly.  Cardiovascular:     Rate and Rhythm: Normal rate and regular rhythm.     Heart sounds: Normal heart sounds. No murmur heard. Pulmonary:     Effort: Pulmonary effort is normal. No respiratory distress.     Breath sounds: Normal breath sounds. No wheezing.  Musculoskeletal:     Cervical back: Neck supple.  Skin:    General: Skin is warm and dry.  Neurological:     Mental Status: She is alert and oriented to person, place, and time.     Comments: Decreased sensation to monofilament right 4th and 5th fingers  Psychiatric:        Mood and Affect: Mood normal.        Behavior: Behavior normal.        Thought Content: Thought content normal.        Judgment: Judgment normal.  BP (!) 135/47 (BP Location: Right Arm, Patient Position: Sitting, Cuff Size: Large)   Pulse 83   Temp (!) 97.5 F (36.4 C) (Oral)   Resp 16   Wt 184 lb (83.5 kg)   SpO2 100%   BMI 33.65 kg/m  Wt Readings from Last 3 Encounters:  11/21/22 184 lb (83.5 kg)  11/01/22 181 lb (82.1 kg)  10/13/22 186 lb (84.4 kg)       Assessment & Plan:   Problem List Items Addressed This Visit       Unprioritized   Diabetes type 2, controlled (HCC)   Relevant Orders   Urine Microalbumin w/creat. ratio   Cervical radiculopathy - Primary    She has already been referred for PT by neurosurgery. Will see if we can get the MRI approved given her finger numbness and  dropping of objects.  Trial of gabapentin for pain.       Relevant Medications   baclofen (LIORESAL) 10 MG tablet   gabapentin (NEURONTIN) 100 MG capsule   Other Relevant Orders   MR CERVICAL SPINE WO CONTRAST   Adnexal cyst   Relevant Orders   US Pelvic Complete With Transvaginal   Other Visit Diagnoses     Screening for colon cancer       Relevant Orders   Ambulatory referral to Gastroenterology       I have changed Cassidy Harrell's carvedilol. I am also having her start on gabapentin and RSV vaccine recomb adjuvanted. Additionally, I am having her maintain her acetaminophen, blood glucose meter kit and supplies, aspirin EC, calcium carbonate, multivitamin with minerals, polyethylene glycol, OneTouch Verio, hydrochlorothiazide, pantoprazole, hydrALAZINE, ondansetron, Iron (Ferrous Sulfate), metFORMIN, methylPREDNISolone, baclofen, and HYDROcodone-acetaminophen.  Meds ordered this encounter  Medications   carvedilol (COREG) 25 MG tablet    Sig: Take 1 tablet (25 mg total) by mouth 2 (two) times daily with a meal.    Dispense:  180 tablet    Refill:  1   gabapentin (NEURONTIN) 100 MG capsule    Sig: Take 1 capsule (100 mg total) by mouth 3 (three) times daily.    Dispense:  90 capsule    Refill:  3    Order Specific Question:   Supervising Provider    Answer:   Danise Edge A [[id number]]   RSV vaccine recomb adjuvanted (AREXVY) 120 MCG/0.5ML injection    Sig: Inject 0.5 mLs into the muscle once for 1 dose.    Dispense:  0.5 mL    Refill:  0    Order Specific Question:   Supervising Provider    Answer:   Danise Edge A [[id number]]

## 2022-11-21 NOTE — Assessment & Plan Note (Signed)
She has already been referred for PT by neurosurgery. Will see if we can get the MRI approved given her finger numbness and dropping of objects.  Trial of gabapentin for pain.

## 2022-11-21 NOTE — Patient Instructions (Signed)
VISIT SUMMARY:  During your visit, we discussed your persistent right arm pain, which you described as a 'pins and needles' sensation radiating from your shoulder to your fingers. You also mentioned a new popping sound in your neck when turning, which you associate with the arm pain. You reported a lack of strength in your hand and difficulty sleeping due to the pain. We also discussed your general health maintenance and planned some tests.  YOUR PLAN:  -CERVICAL RADICULOPATHY: This is a condition where the nerve roots in the neck become damaged or inflamed, causing pain and numbness along the nerve's path in the arm or hand. We will start you on Gabapentin for nerve pain and continue with physical therapy as prescribed by your neurosurgeon. We will also attempt to order an MRI, despite insurance restrictions.  -GENERAL HEALTH MAINTENANCE: We will order a colonoscopy due in November, an ultrasound for fibroids monitoring, and collect urine for annual kidney screening for diabetes.   INSTRUCTIONS:  Please follow up in three months or sooner if you do not experience relief from Gabapentin. Remember to monitor for side effects such as sleepiness from the Gabapentin.

## 2022-11-22 LAB — MICROALBUMIN / CREATININE URINE RATIO
Creatinine,U: 239.8 mg/dL
Microalb Creat Ratio: 0.7 mg/g (ref 0.0–30.0)
Microalb, Ur: 1.7 mg/dL (ref 0.0–1.9)

## 2022-11-28 ENCOUNTER — Other Ambulatory Visit (HOSPITAL_COMMUNITY): Payer: Self-pay

## 2022-11-30 ENCOUNTER — Ambulatory Visit (HOSPITAL_BASED_OUTPATIENT_CLINIC_OR_DEPARTMENT_OTHER)
Admission: RE | Admit: 2022-11-30 | Discharge: 2022-11-30 | Disposition: A | Discharge status: Home / Self Care | Payer: Medicare Other | Source: Ambulatory Visit | Attending: Family | Admitting: Family

## 2022-11-30 DIAGNOSIS — N949 Unspecified condition associated with female genital organs and menstrual cycle: Secondary | ICD-10-CM | POA: Insufficient documentation

## 2022-11-30 DIAGNOSIS — Z780 Asymptomatic menopausal state: Secondary | ICD-10-CM | POA: Diagnosis not present

## 2022-12-04 ENCOUNTER — Encounter: Payer: Self-pay | Admitting: Family

## 2022-12-05 ENCOUNTER — Other Ambulatory Visit (HOSPITAL_BASED_OUTPATIENT_CLINIC_OR_DEPARTMENT_OTHER): Payer: Self-pay

## 2022-12-05 MED ORDER — GABAPENTIN 300 MG PO CAPS
300.0000 mg | ORAL_CAPSULE | Freq: Three times a day (TID) | ORAL | 3 refills | Status: DC
  Filled 2022-12-05: qty 90, 30d supply, fill #0
  Filled 2023-01-02: qty 90, 30d supply, fill #1

## 2022-12-06 ENCOUNTER — Ambulatory Visit (INDEPENDENT_AMBULATORY_CARE_PROVIDER_SITE_OTHER): Payer: Medicare Other | Admitting: *Deleted

## 2022-12-06 DIAGNOSIS — Z780 Asymptomatic menopausal state: Secondary | ICD-10-CM | POA: Diagnosis not present

## 2022-12-06 DIAGNOSIS — Z0000 Encounter for general adult medical examination without abnormal findings: Secondary | ICD-10-CM | POA: Diagnosis not present

## 2022-12-06 NOTE — Patient Instructions (Signed)
Ms. Cassidy Harrell , Thank you for taking time to come for your Medicare Wellness Visit. I appreciate your ongoing commitment to your health goals. Please review the following plan we discussed and let me know if I can assist you in the future.     This is a list of the screening recommended for you and due dates:  Health Maintenance  Topic Date Due   Colon Cancer Screening  12/30/2022   COVID-19 Vaccine (7 - 2023-24 season) 12/22/2022   DTaP/Tdap/Td vaccine (2 - Td or Tdap) 04/02/2023   Hemoglobin A1C  04/15/2023   Eye exam for diabetics  07/14/2023   Yearly kidney function blood test for diabetes  09/19/2023   Complete foot exam   10/13/2023   Screening for Lung Cancer  11/16/2023   Mammogram  11/16/2023   Yearly kidney health urinalysis for diabetes  11/21/2023   Medicare Annual Wellness Visit  12/06/2023   Pneumonia Vaccine  Completed   Flu Shot  Completed   DEXA scan (bone density measurement)  Completed   Hepatitis C Screening  Completed   Zoster (Shingles) Vaccine  Completed   HPV Vaccine  Aged Out    Next appointment: Follow up in one year for your annual wellness visit.   Preventive Care 26 Years and Older, Female Preventive care refers to lifestyle choices and visits with your health care provider that can promote health and wellness. What does preventive care include? A yearly physical exam. This is also called an annual well check. Dental exams once or twice a year. Routine eye exams. Ask your health care provider how often you should have your eyes checked. Personal lifestyle choices, including: Daily care of your teeth and gums. Regular physical activity. Eating a healthy diet. Avoiding tobacco and drug use. Limiting alcohol use. Practicing safe sex. Taking low-dose aspirin every day. Taking vitamin and mineral supplements as recommended by your health care provider. What happens during an annual well check? The services and screenings done by your health care  provider during your annual well check will depend on your age, overall health, lifestyle risk factors, and family history of disease. Counseling  Your health care provider may ask you questions about your: Alcohol use. Tobacco use. Drug use. Emotional well-being. Home and relationship well-being. Sexual activity. Eating habits. History of falls. Memory and ability to understand (cognition). Work and work Astronomer. Reproductive health. Screening  You may have the following tests or measurements: Height, weight, and BMI. Blood pressure. Lipid and cholesterol levels. These may be checked every 5 years, or more frequently if you are over 68 years old. Skin check. Lung cancer screening. You may have this screening every year starting at age 51 if you have a 30-pack-year history of smoking and currently smoke or have quit within the past 15 years. Fecal occult blood test (FOBT) of the stool. You may have this test every year starting at age 75. Flexible sigmoidoscopy or colonoscopy. You may have a sigmoidoscopy every 5 years or a colonoscopy every 10 years starting at age 51. Hepatitis C blood test. Hepatitis B blood test. Sexually transmitted disease (STD) testing. Diabetes screening. This is done by checking your blood sugar (glucose) after you have not eaten for a while (fasting). You may have this done every 1-3 years. Bone density scan. This is done to screen for osteoporosis. You may have this done starting at age 33. Mammogram. This may be done every 1-2 years. Talk to your health care provider about how often you  should have regular mammograms. Talk with your health care provider about your test results, treatment options, and if necessary, the need for more tests. Vaccines  Your health care provider may recommend certain vaccines, such as: Influenza vaccine. This is recommended every year. Tetanus, diphtheria, and acellular pertussis (Tdap, Td) vaccine. You may need a Td  booster every 10 years. Zoster vaccine. You may need this after age 73. Pneumococcal 13-valent conjugate (PCV13) vaccine. One dose is recommended after age 81. Pneumococcal polysaccharide (PPSV23) vaccine. One dose is recommended after age 31. Talk to your health care provider about which screenings and vaccines you need and how often you need them. This information is not intended to replace advice given to you by your health care provider. Make sure you discuss any questions you have with your health care provider. Document Released: 03/12/2015 Document Revised: 11/03/2015 Document Reviewed: 12/15/2014 Elsevier Interactive Patient Education  2017 [employer].  Fall Prevention in the Home Falls can cause injuries. They can happen to people of all ages. There are many things you can do to make your home safe and to help prevent falls. What can I do on the outside of my home? Regularly fix the edges of walkways and driveways and fix any cracks. Remove anything that might make you trip as you walk through a door, such as a raised step or threshold. Trim any bushes or trees on the path to your home. Use bright outdoor lighting. Clear any walking paths of anything that might make someone trip, such as rocks or tools. Regularly check to see if handrails are loose or broken. Make sure that both sides of any steps have handrails. Any raised decks and porches should have guardrails on the edges. Have any leaves, snow, or ice cleared regularly. Use sand or salt on walking paths during winter. Clean up any spills in your garage right away. This includes oil or grease spills. What can I do in the bathroom? Use night lights. Install grab bars by the toilet and in the tub and shower. Do not use towel bars as grab bars. Use non-skid mats or decals in the tub or shower. If you need to sit down in the shower, use a plastic, non-slip stool. Keep the floor dry. Clean up any water that spills on the floor  as soon as it happens. Remove soap buildup in the tub or shower regularly. Attach bath mats securely with double-sided non-slip rug tape. Do not have throw rugs and other things on the floor that can make you trip. What can I do in the bedroom? Use night lights. Make sure that you have a light by your bed that is easy to reach. Do not use any sheets or blankets that are too big for your bed. They should not hang down onto the floor. Have a firm chair that has side arms. You can use this for support while you get dressed. Do not have throw rugs and other things on the floor that can make you trip. What can I do in the kitchen? Clean up any spills right away. Avoid walking on wet floors. Keep items that you use a lot in easy-to-reach places. If you need to reach something above you, use a strong step stool that has a grab bar. Keep electrical cords out of the way. Do not use floor polish or wax that makes floors slippery. If you must use wax, use non-skid floor wax. Do not have throw rugs and other things on the  floor that can make you trip. What can I do with my stairs? Do not leave any items on the stairs. Make sure that there are handrails on both sides of the stairs and use them. Fix handrails that are broken or loose. Make sure that handrails are as long as the stairways. Check any carpeting to make sure that it is firmly attached to the stairs. Fix any carpet that is loose or worn. Avoid having throw rugs at the top or bottom of the stairs. If you do have throw rugs, attach them to the floor with carpet tape. Make sure that you have a light switch at the top of the stairs and the bottom of the stairs. If you do not have them, ask someone to add them for you. What else can I do to help prevent falls? Wear shoes that: Do not have high heels. Have rubber bottoms. Are comfortable and fit you well. Are closed at the toe. Do not wear sandals. If you use a stepladder: Make sure that it is  fully opened. Do not climb a closed stepladder. Make sure that both sides of the stepladder are locked into place. Ask someone to hold it for you, if possible. Clearly mark and make sure that you can see: Any grab bars or handrails. First and last steps. Where the edge of each step is. Use tools that help you move around (mobility aids) if they are needed. These include: Canes. Walkers. Scooters. Crutches. Turn on the lights when you go into a dark area. Replace any light bulbs as soon as they burn out. Set up your furniture so you have a clear path. Avoid moving your furniture around. If any of your floors are uneven, fix them. If there are any pets around you, be aware of where they are. Review your medicines with your doctor. Some medicines can make you feel dizzy. This can increase your chance of falling. Ask your doctor what other things that you can do to help prevent falls. This information is not intended to replace advice given to you by your health care provider. Make sure you discuss any questions you have with your health care provider. Document Released: 12/10/2008 Document Revised: 07/22/2015 Document Reviewed: 03/20/2014 Elsevier Interactive Patient Education  2017 [employer].

## 2022-12-06 NOTE — Progress Notes (Signed)
Subjective:   Cassidy Harrell is a 72 y.o. female who presents for Medicare Annual (Subsequent) preventive examination.  Visit Complete: Virtual I connected with  Ladell Pier on 12/06/22 by a audio enabled telemedicine application and verified that I am speaking with the correct person using two identifiers.  Patient Location: Home  Provider Location: Office/Clinic  I discussed the limitations of evaluation and management by telemedicine. The patient expressed understanding and agreed to proceed.  Vital Signs: Because this visit was a virtual/telehealth visit, some criteria may be missing or patient reported. Any vitals not documented were not able to be obtained and vitals that have been documented are patient reported.   Cardiac Risk Factors include: advanced age (>57men, >67 women);diabetes mellitus;hypertension     Objective:    Today's Vitals   12/06/22 1542  PainSc: 10-Worst pain ever   There is no height or weight on file to calculate BMI.     12/06/2022    3:54 PM 11/08/2021    2:14 PM 08/18/2020    3:08 PM 08/18/2019    1:50 PM 06/25/2019    1:28 PM 04/02/2019    4:00 AM 04/01/2019    6:48 PM  Advanced Directives  Does Patient Have a Medical Advance Directive? Yes Yes Yes Yes Yes Yes Yes  Type of Advance Directive Living will Healthcare Power of [place];Living will Healthcare Power of [place];Living will Healthcare Power of [place];Living will Healthcare Power of [place];Living will Healthcare Power of [place];Living will   Does patient want to make changes to medical advance directive? No - Patient declined   No - Patient declined  No - Patient declined   Copy of Healthcare Power of Attorney in Chart?  No - copy requested No - copy requested No - copy requested No - copy requested No - copy requested     Current Medications (verified) Outpatient Encounter Medications as of 12/06/2022  Medication Sig   acetaminophen (TYLENOL) 650 MG CR tablet Take 650 mg by  mouth in the morning and at bedtime.   aspirin EC 81 MG tablet Take 81 mg by mouth daily with lunch.   baclofen (LIORESAL) 10 MG tablet Take 10 mg by mouth 3 (three) times daily as needed.   blood glucose meter kit and supplies KIT Dispense based on patient and insurance preference. Use up to four times daily as directed. (FOR ICD-9 250.00, 250.01).   calcium carbonate (OSCAL) 1500 (600 Ca) MG TABS tablet Take 1,200 mg by mouth daily with lunch. 800 units vitamin D   carvedilol (COREG) 25 MG tablet Take 1 tablet (25 mg total) by mouth 2 (two) times daily with a meal.   gabapentin (NEURONTIN) 300 MG capsule Take 1 capsule (300 mg total) by mouth 3 (three) times daily.   glucose blood (ONETOUCH VERIO) test strip Check blood sugars daily   hydrALAZINE (APRESOLINE) 25 MG tablet Take 1 tablet (25 mg total) by mouth 3 (three) times daily.   hydrochlorothiazide (HYDRODIURIL) 25 MG tablet TAKE 1 TABLET BY MOUTH DAILY   Iron, Ferrous Sulfate, 325 (65 Fe) MG TABS Take 325 mg by mouth every other day.   metFORMIN (GLUCOPHAGE) 500 MG tablet Take 1 tablet (500 mg total) by mouth 2 (two) times daily with a meal.   methylPREDNISolone (MEDROL DOSEPAK) 4 MG TBPK tablet Take per package instructions   Multiple Vitamins-Minerals (MULTIVITAMIN WITH MINERALS) tablet Take 1 tablet by mouth daily.   ondansetron (ZOFRAN) 4 MG tablet TAKE 1 TABLET BY MOUTH TWICE  DAILY AS NEEDED   pantoprazole (PROTONIX) 40 MG tablet TAKE 1 TABLET BY MOUTH DAILY   polyethylene glycol (MIRALAX / GLYCOLAX) 17 g packet Take 17 g by mouth daily as needed.   [DISCONTINUED] HYDROcodone-acetaminophen (NORCO) 10-325 MG tablet Take 1 tablet by mouth every 4 (four) hours as needed.   No facility-administered encounter medications on file as of 12/06/2022.    Allergies (verified) Aldactone [spironolactone], Hydrocodone-acetaminophen, and Lisinopril   History: Past Medical History:  Diagnosis Date   Acute perforated appendicitis 04/02/2019    Anemia    Arthritis    Depression    Diabetes mellitus without complication (HCC)    Dysrhythmia    Over 10 years ago   Fatty liver    Gum disease    Heart murmur    History of syphilis    Hypertension    Irregular heart beat    Personal history of colonic polyps    Phlebitis    Pre-diabetes    Past Surgical History:  Procedure Laterality Date   CYSTECTOMY     between breasts   IR RADIOLOGIST EVAL & MGMT  04/15/2019   IR RADIOLOGIST EVAL & MGMT  04/29/2019   IR RADIOLOGIST EVAL & MGMT  05/08/2019   LAPAROSCOPIC APPENDECTOMY N/A 06/30/2019   Procedure: APPENDECTOMY LAPAROSCOPIC;  Surgeon: Axel Filler, MD;  Location: [facility] OR;  Service: General;  Laterality: N/A;   SHOULDER ARTHROSCOPY Right 12/2015   SPINE SURGERY  08/20/2015   bulging / herniated disc in C-spine   THERAPEUTIC ABORTION  1970. 1974, 1978   Family History  Problem Relation Age of Onset   Diabetes Mother    Pancreatic cancer Mother    Cancer Father        lung cancer   Kidney disease Father    Seizures Father    Colon cancer Brother    Heart attack Brother    Stroke Brother    Stomach cancer Neg Hx    Esophageal cancer Neg Hx    Rectal cancer Neg Hx    Social History   Socioeconomic History   Marital status: Divorced    Spouse name: Not on file   Number of children: 1   Years of education: Not on file   Highest education level: Associate degree: occupational, Scientist, product/process development, or vocational program  Occupational History   Not on file  Tobacco Use   Smoking status: Former    Current packs/day: 0.00    Average packs/day: 1 pack/day for 40.0 years (40.0 ttl pk-yrs)    Types: Cigarettes    Start date: 48    Quit date: 2011    Years since quitting: 13.7   Smokeless tobacco: Never  Vaping Use   Vaping status: Never Used  Substance and Sexual Activity   Alcohol use: No   Drug use: No   Sexual activity: Not on file  Other Topics Concern   Not on file  Social History Narrative   Regular exercise:   No   Caffeine Use:  No   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    Divorced   Son Age 70- alive and well   2 grand daughters- age 34 and an adopted grandaughter age 37   Loves reading, walking spending time with family, 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            Social Determinants of Health   Financial Resource Strain: Low Risk  (12/06/2022)   Overall  Financial Resource Strain (CARDIA)    Difficulty of Paying Living Expenses: Not very hard  Food Insecurity: No Food Insecurity (12/06/2022)   Hunger Vital Sign    Worried About Running Out of Food in the Last Year: Never true    Ran Out of Food in the Last Year: Never true  Transportation Needs: No Transportation Needs (12/06/2022)   PRAPARE - Administrator, Civil Service (Medical): No    Lack of Transportation (Non-Medical): No  Physical Activity: Inactive (12/06/2022)   Exercise Vital Sign    Days of Exercise per Week: 0 days    Minutes of Exercise per Session: 0 min  Stress: Stress Concern Present (12/06/2022)   [employer] of Occupational Health - Occupational Stress Questionnaire    Feeling of Stress : Very much  Social Connections: Moderately Integrated (12/06/2022)   Social Connection and Isolation Panel [NHANES]    Frequency of Communication with Friends and Family: More than three times a week    Frequency of Social Gatherings with Friends and Family: Twice a week    Attends Religious Services: More than 4 times per year    Active Member of [employer] or Organizations: Yes    Attends Engineer, structural: More than 4 times per year    Marital Status: Divorced    Tobacco Counseling Counseling given: Not Answered   Clinical Intake:  Pre-visit preparation completed: Yes  Pain : 0-10 Pain Score: 10-Worst pain ever Pain Type: Acute pain Pain Location: Arm Pain Orientation: Right Pain Descriptors / Indicators: Aching,  Burning, Shooting, Radiating Pain Onset: 1 to 4 weeks ago Pain Frequency: Constant  Nutritional Risks: None Diabetes: Yes CBG done?: No Did pt. bring in CBG monitor from home?: No  How often do you need to have someone help you when you read instructions, pamphlets, or other written materials from your doctor or pharmacy?: 1 - Never  Interpreter Needed?: No  Information entered by :: Donne Anon, CMA   Activities of Daily Living    12/06/2022    3:44 PM  In your present state of health, do you have any difficulty performing the following activities:  Hearing? 0  Vision? 0  Difficulty concentrating or making decisions? 1  Walking or climbing stairs? 1  Dressing or bathing? 0  Doing errands, shopping? 0  Preparing Food and eating ? N  Using the Toilet? N  In the past six months, have you accidently leaked urine? Y  Do you have problems with loss of bowel control? N  Comment just happened once  Managing your Medications? N  Managing your Finances? N  Housekeeping or managing your Housekeeping? N    Patient Care Team: Sandford Craze, NP as PCP - General (Internal Medicine) Aliene Beams, MD as Consulting Physician (Neurosurgery)  Indicate any recent Medical Services you may have received from other than [facility] providers in the past year (date may be approximate).     Assessment:   This is a routine wellness examination for Cassidy Harrell.  Hearing/Vision screen No results found.   Goals Addressed   None    Depression Screen    12/06/2022    3:53 PM 11/08/2021    2:13 PM 09/06/2021    1:27 PM 08/18/2020    3:10 PM 08/18/2019    1:56 PM 05/19/2015   11:41 AM  PHQ 2/9 Scores  PHQ - 2 Score 2 0 0 0 0 0    Fall Risk    12/06/2022    3:47  PM 11/08/2021    2:15 PM 11/07/2021    2:38 PM 09/06/2021    1:27 PM 08/18/2020    3:09 PM  Fall Risk   Falls in the past year? 0 0 0 0 0  Number falls in past yr: 0 0 0 0 0  Injury with Fall? 0 0 0 0 0  Risk for fall due to : No  Fall Risks No Fall Risks;Impaired vision     Follow up Falls evaluation completed Falls prevention discussed  Falls evaluation completed Falls prevention discussed    MEDICARE RISK AT HOME: Medicare Risk at Home Any stairs in or around the home?: No If so, are there any without handrails?: No Home free of loose throw rugs in walkways, pet beds, electrical cords, etc?: Yes Adequate lighting in your home to reduce risk of falls?: Yes Life alert?: No Use of a cane, walker or w/c?: No Grab bars in the bathroom?: Yes Shower chair or bench in shower?: Yes Elevated toilet seat or a handicapped toilet?: Yes  TIMED UP AND GO:  Was the test performed?  No    Cognitive Function:        12/06/2022    3:57 PM 11/08/2021    2:17 PM  6CIT Screen  What Year? 0 points 0 points  What month? 0 points 0 points  What time? 0 points 0 points  Count back from 20 0 points 0 points  Months in reverse 0 points 0 points  Repeat phrase 0 points 0 points  Total Score 0 points 0 points    Immunizations Immunization History  Administered Date(s) Administered   Fluad Quad(high Dose 65+) 11/05/2018, 11/29/2021   Influenza Split 11/13/2011   Influenza, High Dose Seasonal PF 03/10/2016, 01/02/2017, 02/08/2018, 10/27/2022   Influenza,inj,Quad PF,6+ Mos 11/24/2013   Influenza-Unspecified 11/27/2012, 12/03/2019, 11/30/2020   PFIZER(Purple Top)SARS-COV-2 Vaccination 04/12/2019, 05/06/2019, 12/03/2019   Pfizer Covid-19 Vaccine Bivalent Booster 68yrs & up 11/30/2020   Pfizer(Comirnaty)Fall Seasonal Vaccine 12 years and older 11/26/2021, 10/27/2022   Pneumococcal Conjugate-13 04/04/2017   Pneumococcal Polysaccharide-23 11/24/2013, 10/31/2019   Respiratory Syncytial Virus Vaccine,Recomb Aduvanted(Arexvy) 03/01/2022   Tdap 04/01/2013   Unspecified SARS-COV-2 Vaccination 11/29/2021   Zoster Recombinant(Shingrix) 11/29/2021, 10/27/2022   Zoster, Live 05/28/2015    TDAP status: Up to date  Flu Vaccine  status: Up to date  Pneumococcal vaccine status: Up to date  Covid-19 vaccine status: Completed vaccines  Qualifies for Shingles Vaccine? Yes   Zostavax completed Yes   Shingrix Completed?: Yes  Screening Tests Health Maintenance  Topic Date Due   Medicare Annual Wellness (AWV)  11/09/2022   Colonoscopy  12/30/2022   COVID-19 Vaccine (7 - 2023-24 season) 12/22/2022   DTaP/Tdap/Td (2 - Td or Tdap) 04/02/2023   HEMOGLOBIN A1C  04/15/2023   OPHTHALMOLOGY EXAM  07/14/2023   Diabetic kidney evaluation - eGFR measurement  09/19/2023   FOOT EXAM  10/13/2023   Lung Cancer Screening  11/16/2023   MAMMOGRAM  11/16/2023   Diabetic kidney evaluation - Urine ACR  11/21/2023   Pneumonia Vaccine 11+ Years old  Completed   INFLUENZA VACCINE  Completed   DEXA SCAN  Completed   Hepatitis C Screening  Completed   Zoster Vaccines- Shingrix  Completed   HPV VACCINES  Aged Out    Health Maintenance  Health Maintenance Due  Topic Date Due   Medicare Annual Wellness (AWV)  11/09/2022   Colonoscopy  12/30/2022    Colorectal cancer screening: Type of screening: Colonoscopy. Completed 12/30/19.  Repeat every 3 years GI has already reached out to pt to get scheduled.  Mammogram status: Completed 11/16/22. Repeat every year  Bone Density status: Ordered 12/06/22. Pt provided with contact info and advised to call to schedule appt.  Lung Cancer Screening: (Low Dose CT Chest recommended if Age 26-80 years, 20 pack-year currently smoking OR have quit w/in 15years.) does qualify.   Lung Cancer Screening Referral: last scan completed on 11/16/22  Additional Screening:  Hepatitis C Screening: does qualify; Completed 05/26/21  Vision Screening: Recommended annual ophthalmology exams for early detection of glaucoma and other disorders of the eye. Is the patient up to date with their annual eye exam?  Yes  Who is the provider or what is the name of the office in which the patient attends annual eye exams?  [facility]. If pt is not established with a provider, would they like to be referred to a provider to establish care? No .   Dental Screening: Recommended annual dental exams for proper oral hygiene  Diabetic Foot Exam: Diabetic Foot Exam: Completed 10/13/22  Community Resource Referral / Chronic Care Management: CRR required this visit?  No   CCM required this visit?  No     Plan:     I have personally reviewed and noted the following in the patient's chart:   Medical and social history Use of alcohol, tobacco or illicit drugs  Current medications and supplements including opioid prescriptions. Patient is not currently taking opioid prescriptions. Functional ability and status Nutritional status Physical activity Advanced directives List of other physicians Hospitalizations, surgeries, and ER visits in previous 12 months Vitals Screenings to include cognitive, depression, and falls Referrals and appointments  In addition, I have reviewed and discussed with patient certain preventive protocols, quality metrics, and best practice recommendations. A written personalized care plan for preventive services as well as general preventive health recommendations were provided to patient.     Donne Anon, CMA   12/06/2022   After Visit Summary: (MyChart) Due to this being a telephonic visit, the after visit summary with patients personalized plan was offered to patient via MyChart   Nurse Notes: None

## 2022-12-07 NOTE — Progress Notes (Addendum)
12/08/2022 Cassidy Harrell [id number] [birth year]  Referring provider: Sandford Craze, NP Primary GI doctor: Dr. Chales Abrahams (Dr. Christella Hartigan)  ASSESSMENT AND PLAN:   Iron Deficiency Anemia Iron deficiency noted in July 2024, currently on iron supplementation (65mg  daily). No significant side effects reported. -Continue iron supplementation. -Order CBC and iron studies to assess current status. I recommend upper gastrointestinal and colorectal evaluation with an EGD and colonoscopy.  Risk of bowel prep, conscious sedation, and EGD and colonoscopy were discussed.  Risks include but are not limited to dehydration, pain, bleeding, cardiopulmonary process, bowel perforation, or other possible adverse outcomes..  Treatment plan was discussed with patient, and agreed upon.  Dysphagia/epigastric pain/nausea Chronic issue, no significant findings on previous endoscopy in 2021. Some nausea reported, potentially related to prednisone use. -Continue Protonix 40mg  daily. -Schedule upper endoscopy to reassess.  Colon Cancer Screening History of large adenoma removed in 2021, due for repeat colonoscopy. -Schedule colonoscopy.  Lactose Intolerance Symptoms improved with avoidance of dairy products. -Continue avoidance of dairy products. -Provide list of other common food triggers to avoid.   Patient Care Team: Sandford Craze, NP as PCP - General (Internal Medicine) Aliene Beams, MD as Consulting Physician (Neurosurgery)  HISTORY OF PRESENT ILLNESS: 72 y.o. female with a past medical history of arthritis, alcoholism abstinent since 2007, depression, hypertension, "irregular heart beat", DM II, anemia and colon polyps. S/P appendectomy for a perforated appendix 06/2019, pathology report consistent with acute appendicitis and others listed below presents for evaluation of diarrhea and IDA.   Review of pertinent gastrointestinal problems: 1.  Increased risk for colon cancer.  Her  brother had colon cancer.  Colonoscopy October 2013 showed a single small polyp which was not neoplastic.  Colonoscopy 12/2019 five subcentimeter adenomas were removed.  She was recommended to have repeat colonoscopy at 3-year interval 2.  Perforated appendicitis, abscess Feb2021: Treated with drain placement and then eventual surgery 06/2019 3.  Dyspepsia, belching, dysphagia led to EGD 12/2019; mild nonspecific gastritis and some linear erythema were biopsied.  These showed no evidence of gave, no evidence of H. Pylori.  Gastric emptying scan ordered by her primary care provider 05/2020 was normal  Discussed the use of AI scribe software for clinical note transcription with the patient, who gave verbal consent to proceed.  03/14/2022 B12 700, folate 23 09/19/2022 iron 32, saturation 10, ferritin 85, Hgb 10.6, MCV 86.5, normal platelets and WBC, BUN 20, creatinine 1.1, GFR 49.9 10/13/2022 Hgb 11, MCV 82, MCH 26, normal WBC and platelets, A1c 6.8 Patient had MRI of lumbar spine showed increasing adnexal cyst had pelvic ultrasound with transvaginal in May that recommended follow-up with GYN has repeat pelvic ultrasound pending next week.   The patient, with a history of adenoma and a family history of colon cancer, presents for a follow-up visit. She was diagnosed with iron deficiency in July and has been taking iron supplements once daily since then. She reports no significant side effects from the iron supplements.  The patient also reports a long-standing issue with trouble swallowing. Her last endoscopy, performed at the same time as her colonoscopy, showed mild inflammation but no significant findings.  In addition to these issues, the patient has been dealing with a pinched nerve following a surgery to remove bone spurs from her spine in June. She is scheduled for an cervical MRI next week. She also suspects she is lactose intolerant, as she has noticed stomach issues particularly diarrhea after  consuming dairy products. Since eliminating dairy from  her diet, her stomach issues have improved. The patient reports occasional nausea, which has been a long-term issue. She has noticed that the nausea sometimes worsens with the smell of food. She is currently on Protonix, Miralax, and Tylenol, and has taken a Medrol Dosepak in the past.  She reports no blood in her stool, and no unintentional weight loss. However, she has noticed some weight loss since her surgery in June, which she attributes to increased activity.  The patient is also dealing with the emotional stress of caring for her mother, who was diagnosed with pancreatic cancer and dementia. She visits her mother daily in a skilled nursing facility.    She  reports that she quit smoking about 13 years ago. Her smoking use included cigarettes. She started smoking about 53 years ago. She has a 40 pack-year smoking history. She has never used smokeless tobacco. She reports that she does not drink alcohol and does not use drugs.  RELEVANT LABS AND IMAGING:  Results   LABS Iron: low (08/2022)  DIAGNOSTIC Endoscopy: mild inflammation (2021) Colonoscopy: adenoma removed (2021)      CBC    Component Value Date/Time   WBC 5.8 10/13/2022 1352   RBC 4.11 10/13/2022 1352   HGB 11.0 (L) 10/13/2022 1352   HCT 34.0 (L) 10/13/2022 1352   PLT 229 10/13/2022 1352   MCV 82.7 10/13/2022 1352   MCH 26.8 (L) 10/13/2022 1352   MCHC 32.4 10/13/2022 1352   RDW 13.5 10/13/2022 1352   LYMPHSABS 2,285 10/13/2022 1352   MONOABS 0.8 09/19/2022 1517   EOSABS 400 10/13/2022 1352   BASOSABS 29 10/13/2022 1352   Recent Labs    07/07/22 1136 09/19/22 1517 10/13/22 1352  HGB 12.0 10.6* 11.0*    CMP     Component Value Date/Time   NA 139 09/19/2022 1517   NA 137 06/13/2021 1533   K 3.7 09/19/2022 1517   CL 99 09/19/2022 1517   CO2 28 09/19/2022 1517   GLUCOSE 97 09/19/2022 1517   BUN 20 09/19/2022 1517   BUN 16 06/13/2021 1533    CREATININE 1.11 09/19/2022 1517   CREATININE 0.94 05/13/2021 1502   CALCIUM 9.8 09/19/2022 1517   PROT 7.1 07/07/2022 1136   ALBUMIN 4.2 07/07/2022 1136   AST 17 07/07/2022 1136   ALT 18 07/07/2022 1136   ALKPHOS 59 07/07/2022 1136   BILITOT 0.4 07/07/2022 1136   GFRNONAA >60 06/25/2019 1336   GFRNONAA 78 11/13/2011 0827   GFRAA >60 06/25/2019 1336   GFRAA >89 11/13/2011 0827      Latest Ref Rng & Units 07/07/2022   11:36 AM 05/13/2021    3:02 PM 05/04/2020   11:49 AM  Hepatic Function  Total Protein 6.0 - 8.3 g/dL 7.1  7.2  7.1   Albumin 3.5 - 5.2 g/dL 4.2   4.2   AST 0 - 37 U/L 17  19  19    ALT 0 - 35 U/L 18  20  20    Alk Phosphatase 39 - 117 U/L 59   59   Total Bilirubin 0.2 - 1.2 mg/dL 0.4  0.3  0.3       Current Medications:   Current Outpatient Medications (Endocrine & Metabolic):    metFORMIN (GLUCOPHAGE) 500 MG tablet, Take 1 tablet (500 mg total) by mouth 2 (two) times daily with a meal.   methylPREDNISolone (MEDROL DOSEPAK) 4 MG TBPK tablet, Take per package instructions  Current Outpatient Medications (Cardiovascular):    carvedilol (COREG) 25  MG tablet, Take 1 tablet (25 mg total) by mouth 2 (two) times daily with a meal.   hydrALAZINE (APRESOLINE) 25 MG tablet, Take 1 tablet (25 mg total) by mouth 3 (three) times daily.   hydrochlorothiazide (HYDRODIURIL) 25 MG tablet, TAKE 1 TABLET BY MOUTH DAILY   Current Outpatient Medications (Analgesics):    acetaminophen (TYLENOL) 650 MG CR tablet, Take 650 mg by mouth in the morning and at bedtime.   aspirin EC 81 MG tablet, Take 81 mg by mouth daily with lunch.  Current Outpatient Medications (Hematological):    Iron, Ferrous Sulfate, 325 (65 Fe) MG TABS, Take 325 mg by mouth every other day.  Current Outpatient Medications (Other):    baclofen (LIORESAL) 10 MG tablet, Take 10 mg by mouth 3 (three) times daily as needed.   blood glucose meter kit and supplies KIT, Dispense based on patient and insurance preference.  Use up to four times daily as directed. (FOR ICD-9 250.00, 250.01).   calcium carbonate (OSCAL) 1500 (600 Ca) MG TABS tablet, Take 1,200 mg by mouth daily with lunch. 800 units vitamin D   gabapentin (NEURONTIN) 300 MG capsule, Take 1 capsule (300 mg total) by mouth 3 (three) times daily.   glucose blood (ONETOUCH VERIO) test strip, Check blood sugars daily   Multiple Vitamins-Minerals (MULTIVITAMIN WITH MINERALS) tablet, Take 1 tablet by mouth daily.   ondansetron (ZOFRAN) 4 MG tablet, TAKE 1 TABLET BY MOUTH TWICE  DAILY AS NEEDED   pantoprazole (PROTONIX) 40 MG tablet, TAKE 1 TABLET BY MOUTH DAILY   polyethylene glycol (MIRALAX / GLYCOLAX) 17 g packet, Take 17 g by mouth daily as needed.  Medical History:  Past Medical History:  Diagnosis Date   Acute perforated appendicitis 04/02/2019   Anemia    Arthritis    Depression    Diabetes mellitus without complication (HCC)    Dysrhythmia    Over 10 years ago   Fatty liver    Gum disease    Heart murmur    History of syphilis    Hypertension    Irregular heart beat    Personal history of colonic polyps    Phlebitis    Pre-diabetes    Allergies:  Allergies  Allergen Reactions   Aldactone [Spironolactone] Other (See Comments)    itching   Hydrocodone-Acetaminophen Itching   Lisinopril     Throat itching / cough     Surgical History:  She  has a past surgical history that includes Therapeutic abortion (1970. 1974, 1978); Spine surgery (08/20/2015); Shoulder arthroscopy (Right, 12/2015); IR Radiologist Eval & Mgmt (04/15/2019); IR Radiologist Eval & Mgmt (04/29/2019); IR Radiologist Eval & Mgmt (05/08/2019); laparoscopic appendectomy (N/A, 06/30/2019); Cystectomy; and Back surgery (08/09/2022). Family History:  Her family history includes Cancer in her father; Colon cancer in her brother; Diabetes in her mother; Heart attack in her brother; Kidney disease in her father; Pancreatic cancer in her mother; Seizures in her father; Stroke  in her brother.  REVIEW OF SYSTEMS  : All other systems reviewed and negative except where noted in the History of Present Illness.  PHYSICAL EXAM: BP 128/76 (BP Location: Left Arm, Patient Position: Sitting, Cuff Size: Normal)   Pulse 84   Ht 5\' 2"  (1.575 m)   Wt 183 lb 2 oz (83.1 kg)   BMI 33.49 kg/m  General Appearance: Well nourished, in no apparent distress. Head:   Normocephalic and atraumatic. Eyes:  sclerae anicteric,conjunctive pink  Respiratory: Respiratory effort normal, BS equal bilaterally without rales,  rhonchi, wheezing. Cardio: RRR with no MRGs. Peripheral pulses intact.  Abdomen: Soft,  Obese ,active bowel sounds. mild tenderness in the epigastrium and in the RUQ. Without guarding and Without rebound. No masses. Rectal: declines Musculoskeletal: Full ROM, Normal gait. Without edema. Skin:  Dry and intact without significant lesions or rashes Neuro: Alert and  oriented x4;  No focal deficits. Psych:  Cooperative. Normal mood and affect.    Doree Albee, PA-C 10:31 AM

## 2022-12-08 ENCOUNTER — Ambulatory Visit: Payer: Medicare Other | Admitting: Physician Assistant

## 2022-12-08 ENCOUNTER — Other Ambulatory Visit (INDEPENDENT_AMBULATORY_CARE_PROVIDER_SITE_OTHER): Payer: Medicare Other

## 2022-12-08 ENCOUNTER — Encounter: Payer: Self-pay | Admitting: Physician Assistant

## 2022-12-08 VITALS — BP 128/76 | HR 84 | Ht 62.0 in | Wt 183.1 lb

## 2022-12-08 DIAGNOSIS — K625 Hemorrhage of anus and rectum: Secondary | ICD-10-CM

## 2022-12-08 DIAGNOSIS — R1310 Dysphagia, unspecified: Secondary | ICD-10-CM

## 2022-12-08 DIAGNOSIS — R1013 Epigastric pain: Secondary | ICD-10-CM

## 2022-12-08 DIAGNOSIS — D509 Iron deficiency anemia, unspecified: Secondary | ICD-10-CM

## 2022-12-08 DIAGNOSIS — R110 Nausea: Secondary | ICD-10-CM

## 2022-12-08 DIAGNOSIS — R197 Diarrhea, unspecified: Secondary | ICD-10-CM | POA: Diagnosis not present

## 2022-12-08 DIAGNOSIS — Z860101 Personal history of adenomatous and serrated colon polyps: Secondary | ICD-10-CM

## 2022-12-08 DIAGNOSIS — E739 Lactose intolerance, unspecified: Secondary | ICD-10-CM

## 2022-12-08 LAB — COMPREHENSIVE METABOLIC PANEL
ALT: 15 U/L (ref 0–35)
AST: 13 U/L (ref 0–37)
Albumin: 4.3 g/dL (ref 3.5–5.2)
Alkaline Phosphatase: 51 U/L (ref 39–117)
BUN: 13 mg/dL (ref 6–23)
CO2: 30 meq/L (ref 19–32)
Calcium: 9.8 mg/dL (ref 8.4–10.5)
Chloride: 100 meq/L (ref 96–112)
Creatinine, Ser: 0.82 mg/dL (ref 0.40–1.20)
GFR: 71.78 mL/min (ref >60.00)
Glucose, Bld: 116 mg/dL — ABNORMAL HIGH (ref 70–99)
Potassium: 3.6 meq/L (ref 3.5–5.1)
Sodium: 141 meq/L (ref 135–145)
Total Bilirubin: 0.3 mg/dL (ref 0.2–1.2)
Total Protein: 7 g/dL (ref 6.0–8.3)

## 2022-12-08 LAB — CBC WITH DIFFERENTIAL/PLATELET
Basophils Absolute: 0 10*3/uL (ref 0.0–0.1)
Basophils Relative: 0.4 % (ref 0.0–3.0)
Eosinophils Absolute: 0.2 10*3/uL (ref 0.0–0.7)
Eosinophils Relative: 2.8 % (ref 0.0–5.0)
HCT: 38.9 % (ref 36.0–46.0)
Hemoglobin: 12 g/dL (ref 12.0–15.0)
Lymphocytes Relative: 38.7 % (ref 12.0–46.0)
Lymphs Abs: 2.2 10*3/uL (ref 0.7–4.0)
MCHC: 30.8 g/dL (ref 30.0–36.0)
MCV: 84.9 fL (ref 78.0–100.0)
Monocytes Absolute: 0.6 10*3/uL (ref 0.1–1.0)
Monocytes Relative: 11.3 % (ref 3.0–12.0)
Neutro Abs: 2.6 10*3/uL (ref 1.4–7.7)
Neutrophils Relative %: 46.8 % (ref 43.0–77.0)
Platelets: 208 10*3/uL (ref 150.0–400.0)
RBC: 4.58 Mil/uL (ref 3.87–5.11)
RDW: 15.6 % — ABNORMAL HIGH (ref 11.5–15.5)
WBC: 5.7 10*3/uL (ref 4.0–10.5)

## 2022-12-08 LAB — IBC + FERRITIN
Ferritin: 51.8 ng/mL (ref 10.0–291.0)
Iron: 48 ug/dL (ref 42–145)
Saturation Ratios: 14.7 % — ABNORMAL LOW (ref 20.0–50.0)
TIBC: 326.2 ug/dL (ref 250.0–450.0)
Transferrin: 233 mg/dL (ref 212.0–360.0)

## 2022-12-08 MED ORDER — NA SULFATE-K SULFATE-MG SULF 17.5-3.13-1.6 GM/177ML PO SOLN: 1.0000 | Freq: Once | ORAL | 0 refills | Status: AC

## 2022-12-08 NOTE — Patient Instructions (Addendum)
Your provider has requested that you go to the basement level for lab work before leaving today. Press "B" on the elevator. The lab is located at the first door on the left as you exit the elevator.   Continue off milk, can take lactate. Add fiber like benefiber or citracel once a day Increase activity Can do trial of IBGard which is over the counter for AB pain- Take 1-2 capsules once a day for maintence or twice a day during a flare    FODMAP stands for fermentable oligo-, di-, mono-saccharides and polyols (1). These are the scientific terms used to classify groups of carbs that are difficult for our body to digest and that are notorious for triggering digestive symptoms like bloating, gas, loose stools and stomach pain.   You can try low FODMAP diet  - start with eliminating just one column at a time that you feel may be a trigger for you. - the table at the very bottom contains foods that are low in FODMAPs   Sometimes trying to eliminate the FODMAP's from your diet is difficult or tricky, if you are stuggling with trying to do the elimination diet you can try an enzyme.  There is a food enzymes that you sprinkle in or on your food that helps break down the FODMAP. You can read more about the enzyme by going to this site: https://fodzyme.com/   Due to recent changes in healthcare laws, you may see the results of your imaging and laboratory studies on MyChart before your provider has had a chance to review them.  We understand that in some cases there may be results that are confusing or concerning to you. Not all laboratory results come back in the same time frame and the provider may be waiting for multiple results in order to interpret others.  Please give [place] 48 hours in order for your provider to thoroughly review all the results before contacting the office for clarification of your results.    You have been scheduled for an endoscopy and colonoscopy. Please follow the written  instructions given to you at your visit today.  Please pick up your prep supplies at the pharmacy within the next 1-3 days.  If you use inhalers (even only as needed), please bring them with you on the day of your procedure.  DO NOT TAKE 7 DAYS PRIOR TO TEST- Trulicity (dulaglutide) Ozempic, Wegovy (semaglutide) Mounjaro (tirzepatide) Bydureon Bcise (exanatide extended release)  DO NOT TAKE 1 DAY PRIOR TO YOUR TEST Rybelsus (semaglutide) Adlyxin (lixisenatide) Victoza (liraglutide) Byetta (exanatide) ___________________________________________________________________________   I appreciate the  opportunity to care for you  Thank You   [facility]

## 2022-12-12 NOTE — Progress Notes (Signed)
Agree with assessment/plan.  Raj Florestine Carmical, MD [facility] [phone]  

## 2022-12-13 ENCOUNTER — Ambulatory Visit (HOSPITAL_BASED_OUTPATIENT_CLINIC_OR_DEPARTMENT_OTHER)
Admission: RE | Admit: 2022-12-13 | Discharge: 2022-12-13 | Disposition: A | Discharge status: Home / Self Care | Payer: Medicare Other | Source: Ambulatory Visit | Attending: Family | Admitting: Family

## 2022-12-13 DIAGNOSIS — M5412 Radiculopathy, cervical region: Secondary | ICD-10-CM | POA: Insufficient documentation

## 2022-12-13 DIAGNOSIS — M4721 Other spondylosis with radiculopathy, occipito-atlanto-axial region: Secondary | ICD-10-CM | POA: Diagnosis not present

## 2022-12-13 DIAGNOSIS — M4722 Other spondylosis with radiculopathy, cervical region: Secondary | ICD-10-CM | POA: Diagnosis not present

## 2022-12-13 DIAGNOSIS — G9589 Other specified diseases of spinal cord: Secondary | ICD-10-CM | POA: Diagnosis not present

## 2022-12-13 DIAGNOSIS — M4803 Spinal stenosis, cervicothoracic region: Secondary | ICD-10-CM | POA: Diagnosis not present

## 2022-12-14 DIAGNOSIS — M5431 Sciatica, right side: Secondary | ICD-10-CM | POA: Diagnosis not present

## 2022-12-14 DIAGNOSIS — M48062 Spinal stenosis, lumbar region with neurogenic claudication: Secondary | ICD-10-CM | POA: Diagnosis not present

## 2022-12-14 DIAGNOSIS — M5412 Radiculopathy, cervical region: Secondary | ICD-10-CM | POA: Diagnosis not present

## 2022-12-14 DIAGNOSIS — M5432 Sciatica, left side: Secondary | ICD-10-CM | POA: Diagnosis not present

## 2022-12-22 ENCOUNTER — Telehealth: Payer: Self-pay | Admitting: Family

## 2022-12-22 ENCOUNTER — Other Ambulatory Visit: Payer: Self-pay | Admitting: Family

## 2022-12-22 DIAGNOSIS — M5412 Radiculopathy, cervical region: Secondary | ICD-10-CM

## 2022-12-22 DIAGNOSIS — I10 Essential (primary) hypertension: Secondary | ICD-10-CM

## 2022-12-22 NOTE — Telephone Encounter (Signed)
Patient notified of results and referral 

## 2022-12-22 NOTE — Telephone Encounter (Signed)
Please advise pt that I reviewed her MRI.  It shows that two nerves are compressed in her cervical spine.  I would like to refer her back to Neurosurgery and we will send these results over with the referral.

## 2022-12-24 ENCOUNTER — Encounter: Payer: Self-pay | Admitting: Family

## 2022-12-24 DIAGNOSIS — N83201 Unspecified ovarian cyst, right side: Secondary | ICD-10-CM | POA: Insufficient documentation

## 2022-12-25 ENCOUNTER — Encounter: Payer: Self-pay | Admitting: Family

## 2022-12-25 DIAGNOSIS — M5412 Radiculopathy, cervical region: Secondary | ICD-10-CM

## 2022-12-27 ENCOUNTER — Telehealth: Payer: Self-pay | Admitting: Family

## 2022-12-27 NOTE — Telephone Encounter (Signed)
[facility] called stating that one of their providers had reviewed over her referral and determined that she needed to go back to the person who did her lumbar surgery earlier this year.

## 2023-01-01 DIAGNOSIS — M5412 Radiculopathy, cervical region: Secondary | ICD-10-CM | POA: Diagnosis not present

## 2023-01-02 ENCOUNTER — Encounter (HOSPITAL_COMMUNITY): Payer: Self-pay

## 2023-01-02 ENCOUNTER — Other Ambulatory Visit (HOSPITAL_COMMUNITY): Payer: Self-pay

## 2023-01-05 ENCOUNTER — Encounter: Payer: Self-pay | Admitting: Gastroenterology

## 2023-01-05 ENCOUNTER — Other Ambulatory Visit: Payer: Self-pay

## 2023-01-14 ENCOUNTER — Encounter: Payer: Self-pay | Admitting: Certified Registered Nurse Anesthetist

## 2023-01-17 ENCOUNTER — Ambulatory Visit: Payer: Medicare Other | Admitting: Gastroenterology

## 2023-01-17 ENCOUNTER — Encounter: Payer: Self-pay | Admitting: Gastroenterology

## 2023-01-17 VITALS — BP 155/80 | HR 71 | Temp 97.7°F | Resp 12 | Ht 62.0 in | Wt 183.0 lb

## 2023-01-17 DIAGNOSIS — K317 Polyp of stomach and duodenum: Secondary | ICD-10-CM

## 2023-01-17 DIAGNOSIS — K2970 Gastritis, unspecified, without bleeding: Secondary | ICD-10-CM

## 2023-01-17 DIAGNOSIS — R110 Nausea: Secondary | ICD-10-CM

## 2023-01-17 DIAGNOSIS — K640 First degree hemorrhoids: Secondary | ICD-10-CM

## 2023-01-17 DIAGNOSIS — D120 Benign neoplasm of cecum: Secondary | ICD-10-CM | POA: Diagnosis not present

## 2023-01-17 DIAGNOSIS — D509 Iron deficiency anemia, unspecified: Secondary | ICD-10-CM | POA: Diagnosis not present

## 2023-01-17 DIAGNOSIS — Z1211 Encounter for screening for malignant neoplasm of colon: Secondary | ICD-10-CM | POA: Diagnosis not present

## 2023-01-17 DIAGNOSIS — K625 Hemorrhage of anus and rectum: Secondary | ICD-10-CM

## 2023-01-17 DIAGNOSIS — K3189 Other diseases of stomach and duodenum: Secondary | ICD-10-CM

## 2023-01-17 DIAGNOSIS — Z860101 Personal history of adenomatous and serrated colon polyps: Secondary | ICD-10-CM

## 2023-01-17 DIAGNOSIS — I10 Essential (primary) hypertension: Secondary | ICD-10-CM | POA: Diagnosis not present

## 2023-01-17 DIAGNOSIS — E119 Type 2 diabetes mellitus without complications: Secondary | ICD-10-CM | POA: Diagnosis not present

## 2023-01-17 MED ORDER — SODIUM CHLORIDE 0.9 % IV SOLN: 500.0000 mL | Freq: Once | INTRAVENOUS | Status: DC

## 2023-01-17 NOTE — Op Note (Addendum)
[facility] Patient Name: Cassidy Harrell Procedure Date: 01/17/2023 2:20 PM MRN: [id number] Endoscopist: Lynann Bologna , MD, [id number] Age: 72 Referring MD:  Date of Birth: 03-03-1950 Gender: Female Account #: [id number] Procedure:                Colonoscopy Indications:              Unexplained iron deficiency anemia (resolved). FH                            CRC (brother at age 72) Medicines:                Monitored Anesthesia Care Procedure:                Pre-Anesthesia Assessment:                           - Prior to the procedure, a History and Physical                            was performed, and patient medications and                            allergies were reviewed. The patient's tolerance of                            previous anesthesia was also reviewed. The risks                            and benefits of the procedure and the sedation                            options and risks were discussed with the patient.                            All questions were answered, and informed consent                            was obtained. Prior Anticoagulants: The patient has                            taken no anticoagulant or antiplatelet agents. ASA                            Grade Assessment: II - A patient with mild systemic                            disease. After reviewing the risks and benefits,                            the patient was deemed in satisfactory condition to                            undergo the procedure.  After obtaining informed consent, the colonoscope                            was passed under direct vision. Throughout the                            procedure, the patient's blood pressure, pulse, and                            oxygen saturations were monitored continuously. The                            [id number]-[id number] Colonoscope [id number] was introduced                            through the anus and advanced to the  2 cm into the                            ileum. The colonoscopy was performed without                            difficulty. The patient tolerated the procedure                            well. The quality of the bowel preparation was                            good. The terminal ileum, ileocecal valve,                            appendiceal orifice, and rectum were photographed. Scope In: 2:39:45 PM Scope Out: 2:54:14 PM Scope Withdrawal Time: 0 hours 12 minutes 34 seconds  Total Procedure Duration: 0 hours 14 minutes 29 seconds  Findings:                 A 12 mm polyp was found in the cecum. The polyp was                            sessile. The polyp was removed with a cold snare.                            Resection and retrieval were complete.                           Non-bleeding internal hemorrhoids were found during                            retroflexion. The hemorrhoids were small and Grade                            I (internal hemorrhoids that do not prolapse).                           The terminal ileum appeared  normal.                           The exam was otherwise without abnormality on                            direct and retroflexion views. Complications:            No immediate complications. Estimated Blood Loss:     Estimated blood loss: none. Impression:               - One 12 mm polyp in the cecum, removed with a cold                            snare. Resected and retrieved.                           - Non-bleeding internal hemorrhoids.                           - The examined portion of the ileum was normal.                           - The examination was otherwise normal on direct                            and retroflexion views. Recommendation:           - Patient has a contact number available for                            emergencies. The signs and symptoms of potential                            delayed complications were discussed with the                             patient. Return to normal activities tomorrow.                            Written discharge instructions were provided to the                            patient.                           - Resume previous diet.                           - Continue present medications.                           - Await pathology results.                           - Repeat colonoscopy for surveillance based on  pathology results.                           - If still with problems, CT Abdo/pelvis. She will                            also follow-up with GYN. She will let us know if                            there is any problems in future.                           - The findings and recommendations were discussed                            with the patient's family. Lynann Bologna, MD 01/17/2023 3:01:18 PM This report has been signed electronically.

## 2023-01-17 NOTE — Patient Instructions (Signed)
Handouts Provided:  Gastritis and Polyps  YOU HAD AN ENDOSCOPIC PROCEDURE TODAY AT THE [facility]:   Refer to the procedure report that was given to you for any specific questions about what was found during the examination.  If the procedure report does not answer your questions, please call your gastroenterologist to clarify.  If you requested that your care partner not be given the details of your procedure findings, then the procedure report has been included in a sealed envelope for you to review at your convenience later.  YOU SHOULD EXPECT: Some feelings of bloating in the abdomen. Passage of more gas than usual.  Walking can help get rid of the air that was put into your GI tract during the procedure and reduce the bloating. If you had a lower endoscopy (such as a colonoscopy or flexible sigmoidoscopy) you may notice spotting of blood in your stool or on the toilet paper. If you underwent a bowel prep for your procedure, you may not have a normal bowel movement for a few days.  Please Note:  You might notice some irritation and congestion in your nose or some drainage.  This is from the oxygen used during your procedure.  There is no need for concern and it should clear up in a day or so.  SYMPTOMS TO REPORT IMMEDIATELY:  Following lower endoscopy (colonoscopy or flexible sigmoidoscopy):  Excessive amounts of blood in the stool  Significant tenderness or worsening of abdominal pains  Swelling of the abdomen that is new, acute  Fever of 100F or higher  Following upper endoscopy (EGD)  Vomiting of blood or coffee ground material  New chest pain or pain under the shoulder blades  Painful or persistently difficult swallowing  New shortness of breath  Fever of 100F or higher  Black, tarry-looking stools  For urgent or emergent issues, a gastroenterologist can be reached at any hour by calling [phone]. Do not use MyChart messaging for urgent concerns.    DIET:  We  do recommend a small meal at first, but then you may proceed to your regular diet.  Drink plenty of fluids but you should avoid alcoholic beverages for 24 hours.  ACTIVITY:  You should plan to take it easy for the rest of today and you should NOT DRIVE or use heavy machinery until tomorrow (because of the sedation medicines used during the test).    FOLLOW UP: Our staff will call the number listed on your records the next business day following your procedure.  We will call around 7:15- 8:00 am to check on you and address any questions or concerns that you may have regarding the information given to you following your procedure. If we do not reach you, we will leave a message.     If any biopsies were taken you will be contacted by phone or by letter within the next 1-3 weeks.  Please call us at [phone] if you have not heard about the biopsies in 3 weeks.    SIGNATURES/CONFIDENTIALITY: You and/or your care partner have signed paperwork which will be entered into your electronic medical record.  These signatures attest to the fact that that the information above on your After Visit Summary has been reviewed and is understood.  Full responsibility of the confidentiality of this discharge information lies with you and/or your care-partner.

## 2023-01-17 NOTE — Progress Notes (Signed)
Called to room to assist during endoscopic procedure.  Patient ID and intended procedure confirmed with present staff. Received instructions for my participation in the procedure from the performing physician.  

## 2023-01-17 NOTE — Progress Notes (Unsigned)
12/08/2022 IVA BJORKMAN [id number] August 28, 1950   Referring provider: Sandford Craze, NP Primary GI doctor: Dr. Chales Abrahams (Dr. Christella Hartigan)   ASSESSMENT AND PLAN:    Iron Deficiency Anemia Iron deficiency noted in July 2024, currently on iron supplementation (65mg  daily). No significant side effects reported. -Continue iron supplementation. -Order CBC and iron studies to assess current status. I recommend upper gastrointestinal and colorectal evaluation with an EGD and colonoscopy.  Risk of bowel prep, conscious sedation, and EGD and colonoscopy were discussed.  Risks include but are not limited to dehydration, pain, bleeding, cardiopulmonary process, bowel perforation, or other possible adverse outcomes..  Treatment plan was discussed with patient, and agreed upon.   Dysphagia/epigastric pain/nausea Chronic issue, no significant findings on previous endoscopy in 2021. Some nausea reported, potentially related to prednisone use. -Continue Protonix 40mg  daily. -Schedule upper endoscopy to reassess.   Colon Cancer Screening History of large adenoma removed in 2021, due for repeat colonoscopy. -Schedule colonoscopy.   Lactose Intolerance Symptoms improved with avoidance of dairy products. -Continue avoidance of dairy products. -Provide list of other common food triggers to avoid.     Patient Care Team: Sandford Craze, NP as PCP - General (Internal Medicine) Aliene Beams, MD as Consulting Physician (Neurosurgery)   HISTORY OF PRESENT ILLNESS: 72 y.o. female with a past medical history of arthritis, alcoholism abstinent since 2007, depression, hypertension, "irregular heart beat", DM II, anemia and colon polyps. S/P appendectomy for a perforated appendix 06/2019, pathology report consistent with acute appendicitis and others listed below presents for evaluation of diarrhea and IDA.    Review of pertinent gastrointestinal problems: 1.  Increased risk for colon cancer.  Her  brother had colon cancer.  Colonoscopy October 2013 showed a single small polyp which was not neoplastic.  Colonoscopy 12/2019 five subcentimeter adenomas were removed.  She was recommended to have repeat colonoscopy at 3-year interval 2.  Perforated appendicitis, abscess Feb2021: Treated with drain placement and then eventual surgery 06/2019 3.  Dyspepsia, belching, dysphagia led to EGD 12/2019; mild nonspecific gastritis and some linear erythema were biopsied.  These showed no evidence of gave, no evidence of H. Pylori.  Gastric emptying scan ordered by her primary care provider 05/2020 was normal   Discussed the use of AI scribe software for clinical note transcription with the patient, who gave verbal consent to proceed.   03/14/2022 B12 700, folate 23 09/19/2022 iron 32, saturation 10, ferritin 85, Hgb 10.6, MCV 86.5, normal platelets and WBC, BUN 20, creatinine 1.1, GFR 49.9 10/13/2022 Hgb 11, MCV 82, MCH 26, normal WBC and platelets, A1c 6.8 Patient had MRI of lumbar spine showed increasing adnexal cyst had pelvic ultrasound with transvaginal in May that recommended follow-up with GYN has repeat pelvic ultrasound pending next week.    The patient, with a history of adenoma and a family history of colon cancer, presents for a follow-up visit. She was diagnosed with iron deficiency in July and has been taking iron supplements once daily since then. She reports no significant side effects from the iron supplements.   The patient also reports a long-standing issue with trouble swallowing. Her last endoscopy, performed at the same time as her colonoscopy, showed mild inflammation but no significant findings.   In addition to these issues, the patient has been dealing with a pinched nerve following a surgery to remove bone spurs from her spine in June. She is scheduled for an cervical MRI next week. She also suspects she is lactose intolerant, as she has  noticed stomach issues particularly diarrhea after  consuming dairy products. Since eliminating dairy from her diet, her stomach issues have improved. The patient reports occasional nausea, which has been a long-term issue. She has noticed that the nausea sometimes worsens with the smell of food. She is currently on Protonix, Miralax, and Tylenol, and has taken a Medrol Dosepak in the past.  She reports no blood in her stool, and no unintentional weight loss. However, she has noticed some weight loss since her surgery in June, which she attributes to increased activity.   The patient is also dealing with the emotional stress of caring for her mother, who was diagnosed with pancreatic cancer and dementia. She visits her mother daily in a skilled nursing facility.       She  reports that she quit smoking about 13 years ago. Her smoking use included cigarettes. She started smoking about 53 years ago. She has a 40 pack-year smoking history. She has never used smokeless tobacco. She reports that she does not drink alcohol and does not use drugs.   RELEVANT LABS AND IMAGING:   Results   LABS Iron: low (08/2022)   DIAGNOSTIC Endoscopy: mild inflammation (2021) Colonoscopy: adenoma removed (2021)       CBC Labs (Brief)          Component Value Date/Time    WBC 5.8 10/13/2022 1352    RBC 4.11 10/13/2022 1352    HGB 11.0 (L) 10/13/2022 1352    HCT 34.0 (L) 10/13/2022 1352    PLT 229 10/13/2022 1352    MCV 82.7 10/13/2022 1352    MCH 26.8 (L) 10/13/2022 1352    MCHC 32.4 10/13/2022 1352    RDW 13.5 10/13/2022 1352    LYMPHSABS 2,285 10/13/2022 1352    MONOABS 0.8 09/19/2022 1517    EOSABS 400 10/13/2022 1352    BASOSABS 29 10/13/2022 1352      Recent Labs (within last 365 days)       Recent Labs    07/07/22 1136 09/19/22 1517 10/13/22 1352  HGB 12.0 10.6* 11.0*        CMP     Labs (Brief)          Component Value Date/Time    NA 139 09/19/2022 1517    NA 137 06/13/2021 1533    K 3.7 09/19/2022 1517    CL 99 09/19/2022  1517    CO2 28 09/19/2022 1517    GLUCOSE 97 09/19/2022 1517    BUN 20 09/19/2022 1517    BUN 16 06/13/2021 1533    CREATININE 1.11 09/19/2022 1517    CREATININE 0.94 05/13/2021 1502    CALCIUM 9.8 09/19/2022 1517    PROT 7.1 07/07/2022 1136    ALBUMIN 4.2 07/07/2022 1136    AST 17 07/07/2022 1136    ALT 18 07/07/2022 1136    ALKPHOS 59 07/07/2022 1136    BILITOT 0.4 07/07/2022 1136    GFRNONAA >60 06/25/2019 1336    GFRNONAA 78 11/13/2011 0827    GFRAA >60 06/25/2019 1336    GFRAA >89 11/13/2011 0827          Latest Ref Rng & Units 07/07/2022   11:36 AM 05/13/2021    3:02 PM 05/04/2020   11:49 AM  Hepatic Function  Total Protein 6.0 - 8.3 g/dL 7.1  7.2  7.1   Albumin 3.5 - 5.2 g/dL 4.2    4.2   AST 0 - 37 U/L 17  19  19  ALT 0 - 35 U/L 18  20  20    Alk Phosphatase 39 - 117 U/L 59    59   Total Bilirubin 0.2 - 1.2 mg/dL 0.4  0.3  0.3       Current Medications:    Current Outpatient Medications (Endocrine & Metabolic):    metFORMIN (GLUCOPHAGE) 500 MG tablet, Take 1 tablet (500 mg total) by mouth 2 (two) times daily with a meal.   methylPREDNISolone (MEDROL DOSEPAK) 4 MG TBPK tablet, Take per package instructions   Current Outpatient Medications (Cardiovascular):    carvedilol (COREG) 25 MG tablet, Take 1 tablet (25 mg total) by mouth 2 (two) times daily with a meal.   hydrALAZINE (APRESOLINE) 25 MG tablet, Take 1 tablet (25 mg total) by mouth 3 (three) times daily.   hydrochlorothiazide (HYDRODIURIL) 25 MG tablet, TAKE 1 TABLET BY MOUTH DAILY     Current Outpatient Medications (Analgesics):    acetaminophen (TYLENOL) 650 MG CR tablet, Take 650 mg by mouth in the morning and at bedtime.   aspirin EC 81 MG tablet, Take 81 mg by mouth daily with lunch.   Current Outpatient Medications (Hematological):    Iron, Ferrous Sulfate, 325 (65 Fe) MG TABS, Take 325 mg by mouth every other day.   Current Outpatient Medications (Other):    baclofen (LIORESAL) 10 MG tablet, Take  10 mg by mouth 3 (three) times daily as needed.   blood glucose meter kit and supplies KIT, Dispense based on patient and insurance preference. Use up to four times daily as directed. (FOR ICD-9 250.00, 250.01).   calcium carbonate (OSCAL) 1500 (600 Ca) MG TABS tablet, Take 1,200 mg by mouth daily with lunch. 800 units vitamin D   gabapentin (NEURONTIN) 300 MG capsule, Take 1 capsule (300 mg total) by mouth 3 (three) times daily.   glucose blood (ONETOUCH VERIO) test strip, Check blood sugars daily   Multiple Vitamins-Minerals (MULTIVITAMIN WITH MINERALS) tablet, Take 1 tablet by mouth daily.   ondansetron (ZOFRAN) 4 MG tablet, TAKE 1 TABLET BY MOUTH TWICE  DAILY AS NEEDED   pantoprazole (PROTONIX) 40 MG tablet, TAKE 1 TABLET BY MOUTH DAILY   polyethylene glycol (MIRALAX / GLYCOLAX) 17 g packet, Take 17 g by mouth daily as needed.   Medical History:      Past Medical History:  Diagnosis Date   Acute perforated appendicitis 04/02/2019   Anemia     Arthritis     Depression     Diabetes mellitus without complication (HCC)     Dysrhythmia      Over 10 years ago   Fatty liver     Gum disease     Heart murmur     History of syphilis     Hypertension     Irregular heart beat     Personal history of colonic polyps     Phlebitis     Pre-diabetes          Allergies:  Allergies       Allergies  Allergen Reactions   Aldactone [Spironolactone] Other (See Comments)      itching   Hydrocodone-Acetaminophen Itching   Lisinopril        Throat itching / cough        Surgical History:  She  has a past surgical history that includes Therapeutic abortion (1970. 1974, 1978); Spine surgery (08/20/2015); Shoulder arthroscopy (Right, 12/2015); IR Radiologist Eval & Mgmt (04/15/2019); IR Radiologist Eval & Mgmt (04/29/2019); IR Radiologist Eval & Mgmt (  05/08/2019); laparoscopic appendectomy (N/A, 06/30/2019); Cystectomy; and Back surgery (08/09/2022). Family History:  Her family history includes  Cancer in her father; Colon cancer in her brother; Diabetes in her mother; Heart attack in her brother; Kidney disease in her father; Pancreatic cancer in her mother; Seizures in her father; Stroke in her brother.   REVIEW OF SYSTEMS  : All other systems reviewed and negative except where noted in the History of Present Illness.   PHYSICAL EXAM: BP 128/76 (BP Location: Left Arm, Patient Position: Sitting, Cuff Size: Normal)   Pulse 84   Ht 5\' 2"  (1.575 m)   Wt 183 lb 2 oz (83.1 kg)   BMI 33.49 kg/m  General Appearance: Well nourished, in no apparent distress. Head:   Normocephalic and atraumatic. Eyes:  sclerae anicteric,conjunctive pink  Respiratory: Respiratory effort normal, BS equal bilaterally without rales, rhonchi, wheezing. Cardio: RRR with no MRGs. Peripheral pulses intact.  Abdomen: Soft,  Obese ,active bowel sounds. mild tenderness in the epigastrium and in the RUQ. Without guarding and Without rebound. No masses. Rectal: declines Musculoskeletal: Full ROM, Normal gait. Without edema. Skin:  Dry and intact without significant lesions or rashes Neuro: Alert and  oriented x4;  No focal deficits. Psych:  Cooperative. Normal mood and affect.      Doree Albee, PA-C 10:31 AM   Attending physician's note   I have taken history, reviewed the chart and examined the patient. I performed a substantive portion of this encounter, including complete performance of at least one of the key components, in conjunction with the APP. I agree with the Advanced Practitioner's note, impression and recommendations.    IDA- better. Hb 12, MCV 85 For EGD/colon  Edman Circle, MD [facility] [phone]

## 2023-01-17 NOTE — Op Note (Signed)
[facility] Patient Name: Cassidy Harrell Procedure Date: 01/17/2023 2:21 PM MRN: [id number] Endoscopist: Lynann Bologna , MD, [id number] Age: 72 Referring MD:  Date of Birth: October 24, 1950 Gender: Female Account #: [id number] Procedure:                Upper GI endoscopy Indications:              Iron deficiency anemia. H/O occ dysphagia. Medicines:                Monitored Anesthesia Care Procedure:                Pre-Anesthesia Assessment:                           - Prior to the procedure, a History and Physical                            was performed, and patient medications and                            allergies were reviewed. The patient's tolerance of                            previous anesthesia was also reviewed. The risks                            and benefits of the procedure and the sedation                            options and risks were discussed with the patient.                            All questions were answered, and informed consent                            was obtained. Prior Anticoagulants: The patient has                            taken no anticoagulant or antiplatelet agents. ASA                            Grade Assessment: II - A patient with mild systemic                            disease. After reviewing the risks and benefits,                            the patient was deemed in satisfactory condition to                            undergo the procedure.                           After obtaining informed consent, the endoscope was  passed under direct vision. Throughout the                            procedure, the patient's blood pressure, pulse, and                            oxygen saturations were monitored continuously. The                            GIF [id number] #[id number] was introduced through the                            mouth, and advanced to the second part of duodenum.                            The upper  GI endoscopy was accomplished without                            difficulty. The patient tolerated the procedure                            well. Scope In: Scope Out: Findings:                 The examined esophagus was normal. No etiology for                            dysphagia. Hence, not dilated.                           The Z-line was regular and was found 35 cm from the                            incisors, examined by NBI.                           Localized mild inflammation characterized by                            erythema was found in the gastric antrum with a                            prominent antral fold. Biopsies obtained using cold                            forceps for histology. No outlet obstruction.                           A single 6 mm sessile polyp with no stigmata of                            recent bleeding was found in the gastric body. The  polyp was removed with a cold biopsy forceps.                            Resection and retrieval were complete.                           The examined duodenum was normal. Biopsies for                            histology were taken with a cold forceps for                            evaluation of celiac disease. Complications:            No immediate complications. Estimated Blood Loss:     Estimated blood loss: none. Impression:               - Mild Gastritis with a prominent antral fold.                            Biopsied.                           - A single gastric polyp. Resected and retrieved.                           - Normal examined duodenum. Biopsied. Recommendation:           - Patient has a contact number available for                            emergencies. The signs and symptoms of potential                            delayed complications were discussed with the                            patient. Return to normal activities tomorrow.                            Written  discharge instructions were provided to the                            patient.                           - Resume previous diet.                           - Continue present medications.                           - Await pathology results.                           - The findings and recommendations were discussed  with the patient's family. Lynann Bologna, MD 01/17/2023 2:58:07 PM This report has been signed electronically.

## 2023-01-17 NOTE — Progress Notes (Unsigned)
1422 Robinul 0.1 mg IV given due large amount of secretions upon assessment.  MD made aware, vss 

## 2023-01-17 NOTE — Progress Notes (Unsigned)
Report given to PACU, vss 

## 2023-01-18 ENCOUNTER — Telehealth: Payer: Self-pay

## 2023-01-18 NOTE — Telephone Encounter (Signed)
  Follow up Call-     01/17/2023    1:25 PM  Call back number  Post procedure Call Back phone  # [phone]  Permission to leave phone message Yes     Patient questions:  Do you have a fever, pain , or abdominal swelling? No. Pain Score  0 *  Have you tolerated food without any problems? Yes.    Have you been able to return to your normal activities? Yes.    Do you have any questions about your discharge instructions: Diet   No. Medications  No. Follow up visit  No.  Do you have questions or concerns about your Care? No.  Actions: * If pain score is 4 or above: No action needed, pain <4.

## 2023-01-22 LAB — SURGICAL PATHOLOGY

## 2023-01-29 ENCOUNTER — Other Ambulatory Visit: Payer: Self-pay | Admitting: Family

## 2023-01-29 ENCOUNTER — Encounter: Payer: Self-pay | Admitting: Gastroenterology

## 2023-01-30 ENCOUNTER — Ambulatory Visit (INDEPENDENT_AMBULATORY_CARE_PROVIDER_SITE_OTHER): Payer: Medicare Other | Admitting: Family

## 2023-01-30 VITALS — BP 126/78 | HR 83 | Temp 97.5°F | Resp 16 | Ht 62.0 in | Wt 181.0 lb

## 2023-01-30 DIAGNOSIS — N949 Unspecified condition associated with female genital organs and menstrual cycle: Secondary | ICD-10-CM | POA: Diagnosis not present

## 2023-01-30 DIAGNOSIS — K219 Gastro-esophageal reflux disease without esophagitis: Secondary | ICD-10-CM | POA: Diagnosis not present

## 2023-01-30 DIAGNOSIS — Z7984 Long term (current) use of oral hypoglycemic drugs: Secondary | ICD-10-CM | POA: Diagnosis not present

## 2023-01-30 DIAGNOSIS — E119 Type 2 diabetes mellitus without complications: Secondary | ICD-10-CM

## 2023-01-30 DIAGNOSIS — I10 Essential (primary) hypertension: Secondary | ICD-10-CM | POA: Diagnosis not present

## 2023-01-30 LAB — HEMOGLOBIN A1C: Hgb A1c MFr Bld: 6.5 % (ref 4.6–6.5)

## 2023-01-30 NOTE — Progress Notes (Signed)
Subjective:     Patient ID: Cassidy Harrell, female    DOB: 13-Aug-1950, 72 y.o.   MRN: [id number]  Chief Complaint  Patient presents with   Follow-up    HPI  Discussed the use of AI scribe software for clinical note transcription with the patient, who gave verbal consent to proceed.  History of Present Illness   Cassidy Harrell presents for a routine medical follow-up. She underwent low back surgery in June and has had several follow-ups since then with her neurosurgeon. She also reports significant improvement in her nerve pain following a recent cervical ESI injection in her neck, to the point where she no longer requires gabapentin. However, she has started to experience a new sensation on the other side of her neck, which she plans to discuss with her neurosurgeon at her next appointment. Cassidy Harrell also mentions a spot from the surgery that still causes discomfort, but overall, she feels she is recovering well from the surgery.  Cassidy Harrell also has diabetes, which is controlled with metformin. Her most recent A1c was 6.8, down from 7.0. She recently had a colonoscopy and endoscopy, and reports that her iron levels are good, indicating her anemia is under control.  Cassidy Harrell has been diagnosed with lactose intolerance and has made changes to her diet, including switching to almond milk and plant-based butter. Despite these changes, she still experiences stomach upset and is taking pantoprazole for reflux and odanestron for her stomach. Cassidy Harrell also mentions a fibroid that needs to be monitored.         Health Maintenance Due  Topic Date Due   COVID-19 Vaccine (7 - 2023-24 season) 12/22/2022    Past Medical History:  Diagnosis Date   Acute perforated appendicitis 04/02/2019   Anemia    Arthritis    Bilateral ovarian cysts    Cataract    Depression    Diabetes mellitus without complication (HCC)    Dysrhythmia    Over 10 years ago   Fatty liver    GERD (gastroesophageal reflux disease)     Gum disease    Heart murmur    History of syphilis    Hypertension    Irregular heart beat    Personal history of colonic polyps    Phlebitis    Pre-diabetes     Past Surgical History:  Procedure Laterality Date   BACK SURGERY  08/09/2022   spine   CYSTECTOMY     between breasts   IR RADIOLOGIST EVAL & MGMT  04/15/2019   IR RADIOLOGIST EVAL & MGMT  04/29/2019   IR RADIOLOGIST EVAL & MGMT  05/08/2019   LAPAROSCOPIC APPENDECTOMY N/A 06/30/2019   Procedure: APPENDECTOMY LAPAROSCOPIC;  Surgeon: Axel Filler, MD;  Location: [facility] OR;  Service: General;  Laterality: N/A;   SHOULDER ARTHROSCOPY Right 12/2015   SPINAL FUSION  2014   SPINE SURGERY  08/20/2015   bulging / herniated disc in C-spine   THERAPEUTIC ABORTION  1970. 1974, 1978    Family History  Problem Relation Age of Onset   Diabetes Mother    Pancreatic cancer Mother    Cancer Father        lung cancer   Kidney disease Father    Seizures Father    Colon cancer Brother    Heart attack Brother    Stroke Brother    Stomach cancer Neg Hx    Esophageal cancer Neg Hx    Rectal cancer Neg Hx     Social History   Socioeconomic  History   Marital status: Divorced    Spouse name: Not on file   Number of children: 1   Years of education: Not on file   Highest education level: Associate degree: academic program  Occupational History   Not on file  Tobacco Use   Smoking status: Former    Current packs/day: 0.00    Average packs/day: 1 pack/day for 40.0 years (40.0 ttl pk-yrs)    Types: Cigarettes    Start date: 61    Quit date: 2011    Years since quitting: 13.9   Smokeless tobacco: Never  Vaping Use   Vaping status: Never Used  Substance and Sexual Activity   Alcohol use: No   Drug use: No   Sexual activity: Not on file  Other Topics Concern   Not on file  Social History Narrative   Regular exercise:  No   Caffeine Use:  No   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    Divorced   Son Age 31- alive and well   2 grand daughters- age 81 and an adopted grandaughter age 55   Loves reading, walking spending time with family, 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            Social Determinants of Health   Financial Resource Strain: Low Risk  (01/28/2023)   Overall Financial Resource Strain (CARDIA)    Difficulty of Paying Living Expenses: Not very hard  Food Insecurity: No Food Insecurity (01/28/2023)   Hunger Vital Sign    Worried About Running Out of Food in the Last Year: Never true    Ran Out of Food in the Last Year: Never true  Transportation Needs: No Transportation Needs (01/28/2023)   PRAPARE - Administrator, Civil Service (Medical): No    Lack of Transportation (Non-Medical): No  Physical Activity: Inactive (01/28/2023)   Exercise Vital Sign    Days of Exercise per Week: 0 days    Minutes of Exercise per Session: 0 min  Stress: No Stress Concern Present (01/28/2023)   [employer] of Occupational Health - Occupational Stress Questionnaire    Feeling of Stress : Only a little  Recent Concern: Stress - Stress Concern Present (12/06/2022)   [employer] of Occupational Health - Occupational Stress Questionnaire    Feeling of Stress : Very much  Social Connections: Moderately Integrated (01/28/2023)   Social Connection and Isolation Panel [NHANES]    Frequency of Communication with Friends and Family: More than three times a week    Frequency of Social Gatherings with Friends and Family: Twice a week    Attends Religious Services: More than 4 times per year    Active Member of [employer] or Organizations: Yes    Attends Engineer, structural: More than 4 times per year    Marital Status: Divorced  Intimate Partner Violence: Not At Risk (12/06/2022)   Humiliation, Afraid, Rape, and Kick questionnaire    Fear of Current or Ex-Partner: No    Emotionally Abused: No    Physically Abused:  No    Sexually Abused: No    Outpatient Medications Prior to Visit  Medication Sig Dispense Refill   acetaminophen (TYLENOL) 650 MG CR tablet Take 650 mg by mouth in the morning and at bedtime.     aspirin EC 81 MG tablet Take 81 mg by mouth daily with lunch.     baclofen (LIORESAL) 10 MG tablet Take  10 mg by mouth 3 (three) times daily as needed.     blood glucose meter kit and supplies KIT Dispense based on patient and insurance preference. Use up to four times daily as directed. (FOR ICD-9 250.00, 250.01). 1 each 0   calcium carbonate (OSCAL) 1500 (600 Ca) MG TABS tablet Take 1,200 mg by mouth daily with lunch. 800 units vitamin D     carvedilol (COREG) 25 MG tablet Take 1 tablet (25 mg total) by mouth 2 (two) times daily with a meal. 180 tablet 1   glucose blood (ONETOUCH VERIO) test strip Check blood sugars daily 100 each 12   hydrALAZINE (APRESOLINE) 25 MG tablet Take 1 tablet (25 mg total) by mouth 3 (three) times daily. 270 tablet 3   hydrochlorothiazide (HYDRODIURIL) 25 MG tablet TAKE 1 TABLET BY MOUTH DAILY 100 tablet 1   Iron, Ferrous Sulfate, 325 (65 Fe) MG TABS Take 325 mg by mouth every other day.     LUTEIN PO Take 1 tablet by mouth every morning.     metFORMIN (GLUCOPHAGE) 500 MG tablet TAKE 1 TABLET BY MOUTH TWICE  DAILY WITH MEALS 200 tablet 2   Multiple Vitamins-Minerals (MULTIVITAMIN WITH MINERALS) tablet Take 1 tablet by mouth daily.     ondansetron (ZOFRAN) 4 MG tablet TAKE 1 TABLET BY MOUTH TWICE  DAILY AS NEEDED 180 tablet 0   pantoprazole (PROTONIX) 40 MG tablet TAKE 1 TABLET BY MOUTH DAILY 90 tablet 3   polyethylene glycol (MIRALAX / GLYCOLAX) 17 g packet Take 17 g by mouth daily as needed.     gabapentin (NEURONTIN) 300 MG capsule Take 1 capsule (300 mg total) by mouth 3 (three) times daily. 90 capsule 3   methylPREDNISolone (MEDROL DOSEPAK) 4 MG TBPK tablet Take per package instructions 21 tablet 0   No facility-administered medications prior to visit.     Allergies  Allergen Reactions   Aldactone [Spironolactone] Other (See Comments)    itching   Hydrocodone-Acetaminophen Itching   Lisinopril     Throat itching / cough    ROS See HPI    Objective:    Physical Exam Constitutional:      General: She is not in acute distress.    Appearance: Normal appearance. She is well-developed.  HENT:     Head: Normocephalic and atraumatic.     Right Ear: External ear normal.     Left Ear: External ear normal.  Eyes:     General: No scleral icterus. Neck:     Thyroid: No thyromegaly.  Cardiovascular:     Rate and Rhythm: Normal rate and regular rhythm.     Heart sounds: Normal heart sounds. No murmur heard. Pulmonary:     Effort: Pulmonary effort is normal. No respiratory distress.     Breath sounds: Normal breath sounds. No wheezing.  Musculoskeletal:     Cervical back: Neck supple.  Skin:    General: Skin is warm and dry.  Neurological:     Mental Status: She is alert and oriented to person, place, and time.  Psychiatric:        Mood and Affect: Mood normal.        Behavior: Behavior normal.        Thought Content: Thought content normal.        Judgment: Judgment normal.      BP 126/78   Pulse 83   Temp (!) 97.5 F (36.4 C) (Oral)   Resp 16   Ht 5\' 2"  (1.575 m)  Wt 181 lb (82.1 kg)   SpO2 98%   BMI 33.11 kg/m  Wt Readings from Last 3 Encounters:  01/30/23 181 lb (82.1 kg)  01/17/23 183 lb (83 kg)  12/08/22 183 lb 2 oz (83.1 kg)       Assessment & Plan:   Problem List Items Addressed This Visit       Unprioritized   HTN (hypertension)    BP Readings from Last 3 Encounters:  01/30/23 126/78  01/17/23 (!) 155/80  12/08/22 128/76   Initial bp was elevated. Repeat BP is improved. Continue hydrochlorothiazide and hydralazine.      GERD (gastroesophageal reflux disease)     Chronic stomach upset despite Pantoprazole. Possible lactose intolerance contributing to symptoms.  -Continue Pantoprazole.   -Continue dietary modifications to manage lactose intolerance.        Diabetes type 2, controlled (HCC) - Primary    Lab Results  Component Value Date   HGBA1C 6.8 (H) 10/13/2022   HGBA1C 7.0 (H) 07/07/2022   HGBA1C 6.8 (H) 03/14/2022   Lab Results  Component Value Date   MICROALBUR 1.7 11/21/2022   LDLCALC 11 05/13/2021   CREATININE 0.82 12/08/2022   Last A1c was 6.8 on 10/13/2022. Anticipate possible transient increase due to recent steroid injection. -Check A1c today. -Continue Metformin 500mg  twice daily.        Relevant Orders   HgB A1c   Adnexal cyst     Missed previous gynecology appointment for evaluation of ovarian cyst. -Reschedule gynecology appointment for evaluation.       Relevant Orders   Ambulatory referral to Obstetrics / Gynecology    I have discontinued Cassidy Harrell's methylPREDNISolone and gabapentin. I am also having her maintain her acetaminophen, blood glucose meter kit and supplies, aspirin EC, calcium carbonate, multivitamin with minerals, polyethylene glycol, OneTouch Verio, pantoprazole, hydrALAZINE, ondansetron, Iron (Ferrous Sulfate), baclofen, carvedilol, hydrochlorothiazide, LUTEIN PO, and metFORMIN.  No orders of the defined types were placed in this encounter.

## 2023-01-30 NOTE — Assessment & Plan Note (Signed)
  Chronic stomach upset despite Pantoprazole. Possible lactose intolerance contributing to symptoms.  -Continue Pantoprazole.  -Continue dietary modifications to manage lactose intolerance.

## 2023-01-30 NOTE — Assessment & Plan Note (Addendum)
BP Readings from Last 3 Encounters:  01/30/23 126/78  01/17/23 (!) 155/80  12/08/22 128/76   Initial bp was elevated. Repeat BP is improved. Continue hydrochlorothiazide and hydralazine.

## 2023-01-30 NOTE — Assessment & Plan Note (Addendum)
Lab Results  Component Value Date   HGBA1C 6.8 (H) 10/13/2022   HGBA1C 7.0 (H) 07/07/2022   HGBA1C 6.8 (H) 03/14/2022   Lab Results  Component Value Date   MICROALBUR 1.7 11/21/2022   LDLCALC 11 05/13/2021   CREATININE 0.82 12/08/2022   Last A1c was 6.8 on 10/13/2022. Anticipate possible transient increase due to recent steroid injection. -Check A1c today. -Continue Metformin 500mg  twice daily.

## 2023-01-30 NOTE — Patient Instructions (Signed)
VISIT SUMMARY:  Cassidy Harrell, you had a follow-up appointment today to discuss your recovery from neurosurgery, diabetes management, and other health concerns. You reported significant improvement in your nerve pain but mentioned a new sensation on the other side of your neck. We also reviewed your recent colonoscopy and endoscopy results, which were good, and discussed your ongoing issues with lactose intolerance and stomach upset.  YOUR PLAN:  -POST-OPERATIVE NEUROSURGERY: You are recovering well from your surgery in June, with significant improvement in your symptoms. However, you have noticed a new sensation on the left side of your neck. Please continue with your current management and follow up with your neurosurgeon on 02/13/2023 to discuss this new symptom.  -GASTROESOPHAGEAL REFLUX DISEASE (GERD): GERD is a condition where stomach acid frequently flows back into the tube connecting your mouth and stomach, causing discomfort. Despite taking Pantoprazole, you are still experiencing stomach upset, possibly due to lactose intolerance. Continue taking Pantoprazole and consider further dietary modifications to manage your lactose intolerance.  -TYPE 2 DIABETES MELLITUS: Type 2 diabetes is a condition that affects the way your body processes blood sugar. Your last A1c was 6.8, which is an improvement. We will check your A1c again today. Continue taking Metformin 500mg  twice daily.  -OVARIAN CYST: An ovarian cyst is a fluid-filled sac within the ovary. You missed your previous gynecology appointment for evaluation of the cyst. Please reschedule your gynecology appointment for further evaluation.  -GENERAL HEALTH MAINTENANCE: All your immunizations are up to date. We will repeat your labs in 4 months and have a follow-up appointment in 4 months to review your overall health.  INSTRUCTIONS:  1. Follow up with your neurosurgeon on 02/13/2023 to discuss the new sensation on the left side of your neck. 2.  Reschedule your gynecology appointment for evaluation of your ovarian cyst. 3. Continue taking Metformin 500mg  twice daily and Pantoprazole as prescribed. 4. Consider further dietary modifications to manage your lactose intolerance. 5. Repeat labs in 4 months and schedule a follow-up appointment in 4 months to review your overall health.

## 2023-01-30 NOTE — Assessment & Plan Note (Signed)
  Missed previous gynecology appointment for evaluation of ovarian cyst. -Reschedule gynecology appointment for evaluation.

## 2023-02-02 DIAGNOSIS — M48062 Spinal stenosis, lumbar region with neurogenic claudication: Secondary | ICD-10-CM | POA: Diagnosis not present

## 2023-02-02 DIAGNOSIS — M5412 Radiculopathy, cervical region: Secondary | ICD-10-CM | POA: Diagnosis not present

## 2023-02-05 ENCOUNTER — Other Ambulatory Visit (HOSPITAL_BASED_OUTPATIENT_CLINIC_OR_DEPARTMENT_OTHER): Payer: Self-pay

## 2023-02-05 ENCOUNTER — Other Ambulatory Visit: Payer: Self-pay | Admitting: Family

## 2023-02-05 ENCOUNTER — Encounter: Payer: Self-pay | Admitting: Family

## 2023-02-06 ENCOUNTER — Other Ambulatory Visit (HOSPITAL_BASED_OUTPATIENT_CLINIC_OR_DEPARTMENT_OTHER): Payer: Self-pay

## 2023-02-06 MED ORDER — GABAPENTIN 300 MG PO CAPS: 300.0000 mg | ORAL_CAPSULE | Freq: Three times a day (TID) | ORAL | 3 refills | Status: DC | PRN

## 2023-02-16 ENCOUNTER — Ambulatory Visit: Payer: Medicare Other | Admitting: Family

## 2023-02-19 ENCOUNTER — Encounter: Payer: Self-pay | Admitting: Pharmacist

## 2023-02-19 NOTE — Progress Notes (Signed)
Pharmacy Quality Measure Review  This patient is appearing on report for being at risk of failing the measure for Statin Use in Persons with Diabetes (SUPD) medications this calendar year.   Prior trials of: none.  Patient is history of low LDL - last checked 05/13/2021 was 11, though direct LDL measurement might have shown a little higher since triglycerides can affect calculation of LDL with traditional panel.   Lab Results  Component Value Date   CHOL 86 05/13/2021   HDL 49 (L) 05/13/2021   LDLCALC 11 05/13/2021   TRIG 191 (H) 05/13/2021   CHOLHDL 1.8 05/13/2021   A1c was been at goal of < 7.0%  No statin recommended at this time but should consider checking direct LDL and maybe Lpa in future to assess CVD risk.   Henrene Pastor, PharmD Clinical Pharmacist [facility]  Population Health [phone]

## 2023-03-06 ENCOUNTER — Encounter: Payer: Self-pay | Admitting: Family

## 2023-03-06 MED ORDER — CARVEDILOL 25 MG PO TABS: 25.0000 mg | ORAL_TABLET | Freq: Two times a day (BID) | ORAL | 1 refills | Status: DC

## 2023-03-06 NOTE — Addendum Note (Signed)
 Addended by: Conrad Lorton D on: 03/06/2023 12:13 PM   Modules accepted: Orders

## 2023-03-21 NOTE — Progress Notes (Unsigned)
NEW GYNECOLOGY PATIENT Patient name: Cassidy Harrell MRN [id number]  Date of birth: Mar 15, 1950 Chief Complaint:   Ovarian Cyst     History:  Cassidy Harrell is a 73 y.o. G2P1011 being seen today for incidental ovarian cyst.    Ovarian cysts were found while getting aabck imaging. Has been ahving back pain and now sur eif she ishaivng pain from surgery or pinched nerves. Has pain in lower back and right groin and feels it ws due to surgery. Every now and then may have apain in the lower abdomen but not often. No avginal bleeding or abnormal vaginal discharge. At some point 6 motnhs ago nted some bleeding and primary stated it can happen. A day or so of bleeidng - light red spotting, no clots passed, may have used aliner and it was just a day and noticed it when she went to the bathroom and didn't see anything the following.   Has been many years since LMP at least 10-15 years. Does not believe she has ever had any abnormal pap smears. Occasional early satiety. May have had some bloating - has GERD (sour stomach). Occasional diarrhea with recent lactose intolerance diagnosis, dietary changes. No hematuria.   FH: mom may have had something but passed in 90s, may have had some diagnosis in 31s  Brother had colon cancer, has been up to date with colonoscopy      Gynecologic History No LMP recorded. Patient is postmenopausal. Contraception: post menopausal status Last Pap:     Component Value Date/Time   DIAGPAP  12/04/2018 1514    - Negative for intraepithelial lesion or malignancy (NILM)   HPVHIGH Negative 12/04/2018 1514   ADEQPAP  12/04/2018 1514    Satisfactory for evaluation. The presence or absence of an   ADEQPAP  12/04/2018 1514    endocervical/transformation zone component cannot be determined because   ADEQPAP of atrophy. 12/04/2018 1514    High Risk HPV: Positive  Adequacy:  Satisfactory for evaluation, transformation zone component PRESENT  Diagnosis:  Atypical  squamous cells of undetermined significance (ASC-US)  Last Mammogram:  10/2022 BIRADS 1 Last Colonoscopy:  12/2022  Obstetric History OB History  Gravida Para Term Preterm AB Living  2 1 1  1 1   SAB IAB Ectopic Multiple Live Births   1   1    # Outcome Date GA Lbr Len/2nd Weight Sex Type Anes PTL Lv  2 Term 30 [redacted]w[redacted]d   M Vag-Spont  N LIV  1 IAB 1970            Past Medical History:  Diagnosis Date   Acute perforated appendicitis 04/02/2019   Anemia    Arthritis    Bilateral ovarian cysts    Cataract    Depression    Diabetes mellitus without complication (HCC)    Dysrhythmia    Over 10 years ago   Fatty liver    GERD (gastroesophageal reflux disease)    Gum disease    Heart murmur    History of syphilis    Hypertension    Irregular heart beat    Personal history of colonic polyps    Phlebitis    Pre-diabetes     Past Surgical History:  Procedure Laterality Date   BACK SURGERY  08/09/2022   spine   CYSTECTOMY     between breasts   IR RADIOLOGIST EVAL & MGMT  04/15/2019   IR RADIOLOGIST EVAL & MGMT  04/29/2019   IR RADIOLOGIST EVAL & MGMT  05/08/2019   LAPAROSCOPIC APPENDECTOMY N/A 06/30/2019   Procedure: APPENDECTOMY LAPAROSCOPIC;  Surgeon: Axel Filler, MD;  Location: [facility] OR;  Service: General;  Laterality: N/A;   SHOULDER ARTHROSCOPY Right 12/2015   SPINAL FUSION  2014   SPINE SURGERY  08/20/2015   bulging / herniated disc in C-spine   THERAPEUTIC ABORTION  1970. 1974, 1978    Current Outpatient Medications on File Prior to Visit  Medication Sig Dispense Refill   acetaminophen (TYLENOL) 650 MG CR tablet Take 650 mg by mouth in the morning and at bedtime.     aspirin EC 81 MG tablet Take 81 mg by mouth daily with lunch.     baclofen (LIORESAL) 10 MG tablet Take 10 mg by mouth 3 (three) times daily as needed.     blood glucose meter kit and supplies KIT Dispense based on patient and insurance preference. Use up to four times daily as directed. (FOR  ICD-9 250.00, 250.01). 1 each 0   calcium carbonate (OSCAL) 1500 (600 Ca) MG TABS tablet Take 1,200 mg by mouth daily with lunch. 800 units vitamin D     carvedilol (COREG) 25 MG tablet Take 1 tablet (25 mg total) by mouth 2 (two) times daily with a meal. 180 tablet 1   gabapentin (NEURONTIN) 300 MG capsule Take 1 capsule (300 mg total) by mouth 3 (three) times daily as needed. 90 capsule 3   glucose blood (ONETOUCH VERIO) test strip Check blood sugars daily 100 each 12   hydrALAZINE (APRESOLINE) 25 MG tablet Take 1 tablet (25 mg total) by mouth 3 (three) times daily. 270 tablet 3   hydrochlorothiazide (HYDRODIURIL) 25 MG tablet TAKE 1 TABLET BY MOUTH DAILY 100 tablet 1   Iron, Ferrous Sulfate, 325 (65 Fe) MG TABS Take 325 mg by mouth every other day.     LUTEIN PO Take 1 tablet by mouth every morning.     metFORMIN (GLUCOPHAGE) 500 MG tablet TAKE 1 TABLET BY MOUTH TWICE  DAILY WITH MEALS 200 tablet 2   Multiple Vitamins-Minerals (MULTIVITAMIN WITH MINERALS) tablet Take 1 tablet by mouth daily.     ondansetron (ZOFRAN) 4 MG tablet TAKE 1 TABLET BY MOUTH TWICE  DAILY AS NEEDED 180 tablet 0   pantoprazole (PROTONIX) 40 MG tablet TAKE 1 TABLET BY MOUTH DAILY 90 tablet 3   polyethylene glycol (MIRALAX / GLYCOLAX) 17 g packet Take 17 g by mouth daily as needed.     No current facility-administered medications on file prior to visit.    Allergies  Allergen Reactions   Aldactone [Spironolactone] Other (See Comments)    itching   Hydrocodone-Acetaminophen Itching   Lisinopril     Throat itching / cough    Social History:  reports that she quit smoking about 14 years ago. Her smoking use included cigarettes. She started smoking about 54 years ago. She has a 40 pack-year smoking history. She has never used smokeless tobacco. She reports that she does not drink alcohol and does not use drugs.  Family History  Problem Relation Age of Onset   Diabetes Mother    Pancreatic cancer Mother    Cancer  Father        lung cancer   Kidney disease Father    Seizures Father    Colon cancer Brother    Heart attack Brother    Stroke Brother    Stomach cancer Neg Hx    Esophageal cancer Neg Hx    Rectal cancer Neg Hx  The following portions of the patient's history were reviewed and updated as appropriate: allergies, current medications, past family history, past medical history, past social history, past surgical history and problem list.  Review of Systems Pertinent items noted in HPI and remainder of comprehensive ROS otherwise negative.  Physical Exam:  BP (!) 137/55 (BP Location: Right Arm, Patient Position: Sitting, Cuff Size: Large)   Pulse 76   Wt 186 lb (84.4 kg)   BMI 34.02 kg/m  Physical Exam Vitals and nursing note reviewed.  Constitutional:      Appearance: Normal appearance.  Cardiovascular:     Rate and Rhythm: Normal rate.  Pulmonary:     Effort: Pulmonary effort is normal.     Breath sounds: Normal breath sounds.  Neurological:     General: No focal deficit present.     Mental Status: She is alert and oriented to person, place, and time.  Psychiatric:        Mood and Affect: Mood normal.        Behavior: Behavior normal.        Thought Content: Thought content normal.        Judgment: Judgment normal.        Assessment and Plan:   1. Bilateral ovarian cysts (Primary) Noted to have had incidentally found ovarian/adnexal cysts and does not appear to be symptomatic. Will order ROMA for risk stratification and if normal, will plan for ultrasound surveillance as it seems they may have gotten smaller between imaging studies. If elevated, will refer to gyn onc.  - Ovarian Malignancy Risk-ROMA; Future - Ovarian Malignancy Risk-ROMA    Routine preventative health maintenance measures emphasized. Please refer to After Visit Summary for other counseling recommendations.   Follow-up: No follow-ups on file.      Lorriane Shire, MD Obstetrician &  Gynecologist, [facility] [employer], [facility]

## 2023-03-22 ENCOUNTER — Other Ambulatory Visit: Payer: Self-pay | Admitting: Family

## 2023-03-23 ENCOUNTER — Ambulatory Visit: Payer: Medicare Other | Admitting: Obstetrics and Gynecology

## 2023-03-23 VITALS — BP 137/55 | HR 76 | Wt 186.0 lb

## 2023-03-23 DIAGNOSIS — N83201 Unspecified ovarian cyst, right side: Secondary | ICD-10-CM

## 2023-03-23 DIAGNOSIS — N83202 Unspecified ovarian cyst, left side: Secondary | ICD-10-CM | POA: Diagnosis not present

## 2023-03-24 LAB — OVARIAN MALIGNANCY RISK-ROMA
Cancer Antigen (CA) 125: 8.8 U/mL (ref 0.0–38.1)
HE4: 66.2 pmol/L (ref 0.0–96.9)
Postmenopausal ROMA: 1.05
Premenopausal ROMA: 1.32 — ABNORMAL HIGH

## 2023-03-24 LAB — PREMENOPAUSAL INTERP: HIGH

## 2023-03-24 LAB — POSTMENOPAUSAL INTERP: LOW

## 2023-03-27 ENCOUNTER — Other Ambulatory Visit: Payer: Self-pay

## 2023-03-27 ENCOUNTER — Encounter: Payer: Self-pay | Admitting: Obstetrics and Gynecology

## 2023-03-27 ENCOUNTER — Telehealth: Payer: Self-pay | Admitting: Obstetrics and Gynecology

## 2023-03-27 DIAGNOSIS — N83201 Unspecified ovarian cyst, right side: Secondary | ICD-10-CM

## 2023-03-27 NOTE — Progress Notes (Signed)
Patient aware of results.  Order placed for repeat pelvic US in March. Danna Hefty

## 2023-03-27 NOTE — Telephone Encounter (Signed)
Called patient and no answer, LVM.   [phone] [facility] [phone] [facility]

## 2023-05-01 ENCOUNTER — Ambulatory Visit (HOSPITAL_BASED_OUTPATIENT_CLINIC_OR_DEPARTMENT_OTHER)
Admission: RE | Admit: 2023-05-01 | Discharge: 2023-05-01 | Disposition: A | Discharge status: Home / Self Care | Payer: Medicare Other | Source: Ambulatory Visit | Attending: Obstetrics and Gynecology | Admitting: Obstetrics and Gynecology

## 2023-05-01 ENCOUNTER — Ambulatory Visit (INDEPENDENT_AMBULATORY_CARE_PROVIDER_SITE_OTHER): Payer: Medicare Other | Admitting: Family

## 2023-05-01 ENCOUNTER — Encounter: Payer: Self-pay | Admitting: Family

## 2023-05-01 VITALS — BP 148/73 | HR 74 | Temp 97.9°F | Resp 16 | Ht 62.0 in | Wt 180.0 lb

## 2023-05-01 DIAGNOSIS — E119 Type 2 diabetes mellitus without complications: Secondary | ICD-10-CM | POA: Diagnosis not present

## 2023-05-01 DIAGNOSIS — Z8639 Personal history of other endocrine, nutritional and metabolic disease: Secondary | ICD-10-CM | POA: Diagnosis not present

## 2023-05-01 DIAGNOSIS — K529 Noninfective gastroenteritis and colitis, unspecified: Secondary | ICD-10-CM | POA: Diagnosis not present

## 2023-05-01 DIAGNOSIS — N83201 Unspecified ovarian cyst, right side: Secondary | ICD-10-CM | POA: Diagnosis present

## 2023-05-01 DIAGNOSIS — N83202 Unspecified ovarian cyst, left side: Secondary | ICD-10-CM | POA: Diagnosis present

## 2023-05-01 DIAGNOSIS — I10 Essential (primary) hypertension: Secondary | ICD-10-CM

## 2023-05-01 DIAGNOSIS — K219 Gastro-esophageal reflux disease without esophagitis: Secondary | ICD-10-CM | POA: Diagnosis not present

## 2023-05-01 DIAGNOSIS — Z7984 Long term (current) use of oral hypoglycemic drugs: Secondary | ICD-10-CM | POA: Diagnosis not present

## 2023-05-01 MED ORDER — HYDROCHLOROTHIAZIDE 25 MG PO TABS: 25.0000 mg | ORAL_TABLET | Freq: Every day | ORAL | 1 refills | Status: DC

## 2023-05-01 NOTE — Assessment & Plan Note (Signed)
 Lab Results  Component Value Date   HGBA1C 6.5 01/30/2023   HGBA1C 6.8 (H) 10/13/2022   HGBA1C 7.0 (H) 07/07/2022   Lab Results  Component Value Date   MICROALBUR 1.7 11/21/2022   LDLCALC 11 05/13/2021   CREATININE 0.82 12/08/2022   Stable on metformin, continue same, update A1C.

## 2023-05-01 NOTE — Assessment & Plan Note (Signed)
 Stable on daily pantoprazole, continue same.

## 2023-05-01 NOTE — Assessment & Plan Note (Signed)
 Initial bp quite elevated but pt had not taken am meds before coming to her appointment.  Repeat 149/73 later in the evening at home. Maintained on carvedilol 25mg  bid, hydrochlorothiazide 25mg . Will see if she can tolerate amlodipine 2.5 mg without swelling.

## 2023-05-01 NOTE — Assessment & Plan Note (Signed)
 Advised pt to follow up with her GI specialist.

## 2023-05-01 NOTE — Progress Notes (Signed)
 Subjective:     Patient ID: Cassidy Harrell, female    DOB: 04-Jan-1951, 73 y.o.   MRN: [id number]  Chief Complaint  Patient presents with  . Diabetes    Here for follow up  . Hypertension    Here for follow up    HPI  Discussed the use of AI scribe software for clinical note transcription with the patient, who gave verbal consent to proceed.  History of Present Illness Cassidy Harrell is a 73 year old female who presents for a regular follow-up visit.  She has been experiencing chronic diarrhea since her appendectomy five years ago. She describes the diarrhea as 'everything I eat just comes straight through me.' Despite dietary modifications, including using dairy-free products and avoiding milk and yogurt, her symptoms persist. She has not discussed these issues with her gastroenterologist since last year, although she underwent an endoscopy on November 20th of the previous year. She acknowledges that metformin, which she has been taking for a couple of years, can cause diarrhea, but notes that her symptoms have persisted longer than her use of the medication.  She typically takes pantoprazole daily and is on metformin. She uses a weekly pill box to manage her medication schedule. She has not taken her medications today due to fasting for an ultrasound earlier in the day.  She reports experiencing pinching sensations on both sides, which she associates with a previous surgical procedure. She is uncertain about the timing of her last injection related to this issue and plans to contact her surgeon's office for clarification.  She notes a significant weight loss, which she attributes to uncertainty about what to eat. She has not eaten today and plans to do so after the visit to take her medications.  No current issues with reflux or heartburn.  Wt Readings from Last 3 Encounters:  05/01/23 180 lb (81.6 kg)  03/23/23 186 lb (84.4 kg)  01/30/23 181 lb (82.1 kg)   Lab Results   Component Value Date   CHOL 86 05/13/2021   HDL 49 (L) 05/13/2021   LDLCALC 11 05/13/2021   TRIG 191 (H) 05/13/2021   CHOLHDL 1.8 05/13/2021        Health Maintenance Due  Topic Date Due  . COVID-19 Vaccine (7 - 2024-25 season) 12/22/2022  . DTaP/Tdap/Td (2 - Td or Tdap) 04/02/2023    Past Medical History:  Diagnosis Date  . Acute perforated appendicitis 04/02/2019  . Anemia   . Arthritis   . Bilateral ovarian cysts   . Cataract   . Depression   . Diabetes mellitus without complication (HCC)   . Dysrhythmia    Over 10 years ago  . Fatty liver   . GERD (gastroesophageal reflux disease)   . Gum disease   . Heart murmur   . History of syphilis   . Hypertension   . Irregular heart beat   . Personal history of colonic polyps   . Phlebitis   . Pre-diabetes     Past Surgical History:  Procedure Laterality Date  . BACK SURGERY  08/09/2022   spine  . CYSTECTOMY     between breasts  . IR RADIOLOGIST EVAL & MGMT  04/15/2019  . IR RADIOLOGIST EVAL & MGMT  04/29/2019  . IR RADIOLOGIST EVAL & MGMT  05/08/2019  . LAPAROSCOPIC APPENDECTOMY N/A 06/30/2019   Procedure: APPENDECTOMY LAPAROSCOPIC;  Surgeon: Axel Filler, MD;  Location: [facility] OR;  Service: General;  Laterality: N/A;  . SHOULDER ARTHROSCOPY Right 12/2015  .  SPINAL FUSION  2014  . SPINE SURGERY  08/20/2015   bulging / herniated disc in C-spine  . THERAPEUTIC ABORTION  1970. 1974, 1978    Family History  Problem Relation Age of Onset  . Diabetes Mother   . Pancreatic cancer Mother   . Cancer Father        lung cancer  . Kidney disease Father   . Seizures Father   . Colon cancer Brother   . Heart attack Brother   . Stroke Brother   . Stomach cancer Neg Hx   . Esophageal cancer Neg Hx   . Rectal cancer Neg Hx     Social History   Socioeconomic History  . Marital status: Divorced    Spouse name: Not on file  . Number of children: 1  . Years of education: Not on file  . Highest education  level: Associate degree: academic program  Occupational History  . Not on file  Tobacco Use  . Smoking status: Former    Current packs/day: 0.00    Average packs/day: 1 pack/day for 40.0 years (40.0 ttl pk-yrs)    Types: Cigarettes    Start date: 89    Quit date: 2011    Years since quitting: 14.1  . Smokeless tobacco: Never  Vaping Use  . Vaping status: Never Used  Substance and Sexual Activity  . Alcohol use: No  . Drug use: No  . Sexual activity: Not on file  Other Topics Concern  . 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 Difficulty of Paying Living Expenses: Not very hard  Food Insecurity: No Food Insecurity (04/25/2023)   Hunger Vital Sign   . Worried About Programme researcher, broadcasting/film/video in the Last Year: Never true   . Ran Out of Food in the Last Year: Never true  Transportation Needs: No Transportation Needs (04/25/2023)   PRAPARE - Transportation   . Lack of Transportation (Medical): No   . Lack of Transportation (Non-Medical): No  Physical Activity: Inactive (04/25/2023)   Exercise Vital Sign   . Days of Exercise per Week: 0 days   . Minutes of Exercise per Session: 0 min  Stress: No Stress Concern Present (04/25/2023)   [employer] of Occupational Health - Occupational Stress Questionnaire   . Feeling of Stress : Not at all  Social Connections: Moderately Integrated (04/25/2023)   Social Connection and Isolation Panel [NHANES]   . Frequency of Communication with Friends and Family: More  than three times a week   . Frequency of Social Gatherings with Friends and Family: Twice a week   . Attends Religious Services: More than 4 times per year   . Active Member of Clubs or Organizations: Yes   . Attends Banker Meetings: More than 4 times per year   . Marital Status: Divorced  Catering manager Violence: Not At Risk (12/06/2022)   Humiliation, Afraid, Rape,  and Kick questionnaire   . Fear of Current or Ex-Partner: No   . Emotionally Abused: No   . Physically Abused: No   . Sexually Abused: No    Outpatient Medications Prior to Visit  Medication Sig Dispense Refill  . acetaminophen (TYLENOL) 650 MG CR tablet Take 650 mg by mouth in the morning and at bedtime.    Marland Kitchen aspirin EC 81 MG tablet Take 81 mg by mouth daily with lunch.    . baclofen (LIORESAL) 10 MG tablet Take 10 mg by mouth 3 (three) times daily as needed.    . blood glucose meter kit and supplies KIT Dispense based on patient and insurance preference. Use up to four times daily as directed. (FOR ICD-9 250.00, 250.01). 1 each 0  . calcium carbonate (OSCAL) 1500 (600 Ca) MG TABS tablet Take 1,200 mg by mouth daily with lunch. 800 units vitamin D    . carvedilol (COREG) 25 MG tablet Take 1 tablet (25 mg total) by mouth 2 (two) times daily with a meal. 180 tablet 1  . gabapentin (NEURONTIN) 300 MG capsule Take 1 capsule (300 mg total) by mouth 3 (three) times daily as needed. 90 capsule 3  . glucose blood (ONETOUCH VERIO) test strip Check blood sugars daily 100 each 12  . hydrALAZINE (APRESOLINE) 25 MG tablet TAKE 1 TABLET BY MOUTH 3 TIMES  DAILY 300 tablet 1  . Iron, Ferrous Sulfate, 325 (65 Fe) MG TABS Take 325 mg by mouth every other day.    . LUTEIN PO Take 1 tablet by mouth every morning.    . metFORMIN (GLUCOPHAGE) 500 MG tablet TAKE 1 TABLET BY MOUTH TWICE  DAILY WITH MEALS 200 tablet 2  . Multiple Vitamins-Minerals (MULTIVITAMIN WITH MINERALS) tablet Take 1 tablet by mouth daily.    . ondansetron  (ZOFRAN) 4 MG tablet TAKE 1 TABLET BY MOUTH TWICE  DAILY AS NEEDED 180 tablet 0  . pantoprazole (PROTONIX) 40 MG tablet TAKE 1 TABLET BY MOUTH DAILY 100 tablet 1  . polyethylene glycol (MIRALAX / GLYCOLAX) 17 g packet Take 17 g by mouth daily as needed.    . hydrochlorothiazide (HYDRODIURIL) 25 MG tablet TAKE 1 TABLET BY MOUTH DAILY 100 tablet 1   No facility-administered medications prior to visit.    Allergies  Allergen Reactions  . Aldactone [Spironolactone] Other (See Comments)    itching  . Hydrocodone-Acetaminophen Itching  . Lisinopril     Throat itching / cough    ROS See HPI    Objective:    Physical Exam Constitutional:      General: She is not in acute distress.    Appearance: Normal appearance. She is well-developed.  HENT:     Head: Normocephalic and atraumatic.     Right Ear: External ear normal.     Left Ear: External ear normal.  Eyes:     General: No scleral icterus. Neck:     Thyroid: No thyromegaly.  Cardiovascular:     Rate and Rhythm: Normal rate and regular rhythm.     Heart sounds: Normal heart sounds. No murmur heard. Pulmonary:     Effort: Pulmonary effort is normal. No respiratory distress.     Breath sounds: Normal breath sounds. No wheezing.  Musculoskeletal:     Cervical back: Neck supple.  Skin:    General: Skin is warm and dry.  Neurological:     Mental Status: She is alert and oriented to person, place, and time.  Psychiatric:  Mood and Affect: Mood normal.        Behavior: Behavior normal.        Thought Content: Thought content normal.        Judgment: Judgment normal.     BP (!) 148/73 Comment: take at home evening 3/4  Pulse 74   Temp 97.9 F (36.6 C) (Oral)   Resp 16   Ht 5\' 2"  (1.575 m)   Wt 180 lb (81.6 kg)   SpO2 99%   BMI 32.92 kg/m  Wt Readings from Last 3 Encounters:  05/01/23 180 lb (81.6 kg)  03/23/23 186 lb (84.4 kg)  01/30/23 181 lb (82.1 kg)       Assessment & Plan:   Problem List Items  Addressed This Visit       Unprioritized   HTN (hypertension)   Initial bp quite elevated but pt had not taken am meds before coming to her appointment.  Repeat 149/73 later in the evening at home. Maintained on carvedilol 25mg  bid, hydrochlorothiazide 25mg . Will see if she can tolerate amlodipine 2.5 mg without swelling.       Relevant Medications   hydrochlorothiazide (HYDRODIURIL) 25 MG tablet   amLODipine (NORVASC) 2.5 MG tablet   History of hypothyroidism - Primary   Lab Results  Component Value Date   TSH 3.08 02/08/2021         GERD (gastroesophageal reflux disease)   Stable on daily pantoprazole, continue same.       Diabetes type 2, controlled (HCC)   Lab Results  Component Value Date   HGBA1C 6.5 01/30/2023   HGBA1C 6.8 (H) 10/13/2022   HGBA1C 7.0 (H) 07/07/2022   Lab Results  Component Value Date   MICROALBUR 1.7 11/21/2022   LDLCALC 11 05/13/2021   CREATININE 0.82 12/08/2022   Stable on metformin, continue same, update A1C.       Relevant Orders   HgB A1c (Completed)   Basic Metabolic Panel (BMET) (Completed)   Chronic diarrhea   Advised pt to follow up with her GI specialist.        I have changed Daveena D. Spinnato's hydrochlorothiazide. I am also having her start on amLODipine. Additionally, I am having her maintain her acetaminophen, blood glucose meter kit and supplies, aspirin EC, calcium carbonate, multivitamin with minerals, polyethylene glycol, OneTouch Verio, Iron (Ferrous Sulfate), baclofen, LUTEIN PO, metFORMIN, ondansetron, gabapentin, carvedilol, pantoprazole, and hydrALAZINE.  Meds ordered this encounter  Medications  . hydrochlorothiazide (HYDRODIURIL) 25 MG tablet    Sig: Take 1 tablet (25 mg total) by mouth daily.    Dispense:  100 tablet    Refill:  1    Please send a replace/new response with 100-Day Supply if appropriate to maximize member benefit. Requesting 1 year supply.    Supervising Provider:   Danise Edge A [[id number]]  .  amLODipine (NORVASC) 2.5 MG tablet    Sig: Take 1 tablet (2.5 mg total) by mouth daily.    Dispense:  30 tablet    Refill:  0    Supervising Provider:   Danise Edge A [[id number]]

## 2023-05-01 NOTE — Assessment & Plan Note (Signed)
 Lab Results  Component Value Date   TSH 3.08 02/08/2021

## 2023-05-01 NOTE — Patient Instructions (Signed)
 VISIT SUMMARY:  Today, we discussed several health concerns, including your chronic diarrhea, hypertension, hyperlipidemia, type 2 diabetes, medication management, and an orthopedic issue. We also reviewed your general health maintenance and follow-up plans.  YOUR PLAN:  -CHRONIC DIARRHEA: Chronic diarrhea means having loose or watery stools that persist for a long time. This has been ongoing since your appendectomy five years ago. We suspect it might be due to lactose intolerance or your medication, Metformin. You will be referred to Dr. Chales Abrahams, a gastroenterologist, for further evaluation and management.  -HYPERTENSION: Hypertension is high blood pressure. Your blood pressure was elevated today, possibly because you missed your morning medications. Please continue your current antihypertensive regimen and check your blood pressure at home, then report the readings to [place].  -HYPERLIPIDEMIA: Hyperlipidemia means having high levels of fats (lipids) in your blood. Your cholesterol levels are well-controlled. Please continue with your current management plan.  -TYPE 2 DIABETES MELLITUS: Type 2 diabetes is a condition where your body does not use insulin properly, leading to high blood sugar levels. Your last A1C was 6.5, which is good. Continue taking Metformin, and we will check your A1C and kidney function today.  -MEDICATION MANAGEMENT: You have had difficulty remembering to take your medications. Using a weekly pill box can help ensure you take your medications consistently. We will also refill your Hydrochlorothiazide prescription and send it to [facility].  -ORTHOPEDIC CONCERN: You are experiencing a pinching sensation on both sides, which may be related to a previous surgical procedure. Please contact your orthopedic surgeon's office for further evaluation.  -GASTROESOPHAGEAL REFLUX DISEASE: Gastroesophageal reflux disease (GERD) is a condition where stomach acid frequently flows back into the tube  connecting your mouth and stomach. You have no current symptoms, so please continue taking Pantoprazole daily.  INSTRUCTIONS:  Please check your blood pressure at home and report the readings. We will check your A1C and kidney function today. Follow up with Dr. Chales Abrahams, the gastroenterologist, for your chronic diarrhea. Also, contact your orthopedic surgeon's office regarding the pinching sensation you are experiencing.

## 2023-05-02 LAB — BASIC METABOLIC PANEL
BUN: 11 mg/dL (ref 6–23)
CO2: 27 meq/L (ref 19–32)
Calcium: 10 mg/dL (ref 8.4–10.5)
Chloride: 98 meq/L (ref 96–112)
Creatinine, Ser: 0.84 mg/dL (ref 0.40–1.20)
GFR: 69.54 mL/min (ref >60.00)
Glucose, Bld: 93 mg/dL (ref 70–99)
Potassium: 3.3 meq/L — ABNORMAL LOW (ref 3.5–5.1)
Sodium: 139 meq/L (ref 135–145)

## 2023-05-02 LAB — HEMOGLOBIN A1C: Hgb A1c MFr Bld: 6.2 % (ref 4.6–6.5)

## 2023-05-02 MED ORDER — AMLODIPINE BESYLATE 2.5 MG PO TABS: 2.5000 mg | ORAL_TABLET | Freq: Every day | ORAL | 0 refills | Status: DC

## 2023-05-02 NOTE — Addendum Note (Signed)
 Addended by: Sandford Craze on: 05/02/2023 12:51 PM   Modules accepted: Orders

## 2023-05-03 ENCOUNTER — Telehealth: Payer: Self-pay | Admitting: Family

## 2023-05-03 DIAGNOSIS — E876 Hypokalemia: Secondary | ICD-10-CM

## 2023-05-03 MED ORDER — POTASSIUM CHLORIDE CRYS ER 20 MEQ PO TBCR: 20.0000 meq | EXTENDED_RELEASE_TABLET | Freq: Every day | ORAL | 0 refills | Status: DC

## 2023-05-03 NOTE — Telephone Encounter (Signed)
 Patient notified of results and scheduled for bmet 05/11/23

## 2023-05-03 NOTE — Telephone Encounter (Signed)
 Potassium is low.  Please add kdur 20 mEQ once daily and repeat bmet in 1 week. A1C looks great at 6.2.

## 2023-05-04 ENCOUNTER — Ambulatory Visit: Payer: Medicare Other | Admitting: Family

## 2023-05-11 ENCOUNTER — Other Ambulatory Visit (INDEPENDENT_AMBULATORY_CARE_PROVIDER_SITE_OTHER)

## 2023-05-11 DIAGNOSIS — E876 Hypokalemia: Secondary | ICD-10-CM

## 2023-05-11 LAB — BASIC METABOLIC PANEL
BUN: 16 mg/dL (ref 6–23)
CO2: 28 meq/L (ref 19–32)
Calcium: 9.6 mg/dL (ref 8.4–10.5)
Chloride: 100 meq/L (ref 96–112)
Creatinine, Ser: 0.89 mg/dL (ref 0.40–1.20)
GFR: 64.87 mL/min (ref >60.00)
Glucose, Bld: 118 mg/dL — ABNORMAL HIGH (ref 70–99)
Potassium: 3.9 meq/L (ref 3.5–5.1)
Sodium: 139 meq/L (ref 135–145)

## 2023-05-13 ENCOUNTER — Encounter: Payer: Self-pay | Admitting: Family

## 2023-05-14 ENCOUNTER — Other Ambulatory Visit: Payer: Self-pay | Admitting: Family

## 2023-05-14 MED ORDER — ONDANSETRON HCL 4 MG PO TABS: 4.0000 mg | ORAL_TABLET | Freq: Two times a day (BID) | ORAL | 1 refills | Status: DC | PRN

## 2023-05-22 ENCOUNTER — Encounter: Payer: Self-pay | Admitting: Obstetrics and Gynecology

## 2023-05-24 ENCOUNTER — Other Ambulatory Visit: Payer: Self-pay | Admitting: Family

## 2023-05-30 ENCOUNTER — Other Ambulatory Visit: Payer: Self-pay | Admitting: Family

## 2023-05-30 DIAGNOSIS — I10 Essential (primary) hypertension: Secondary | ICD-10-CM

## 2023-05-31 ENCOUNTER — Encounter: Payer: Self-pay | Admitting: Family

## 2023-06-18 ENCOUNTER — Other Ambulatory Visit: Payer: Self-pay | Admitting: Family

## 2023-06-22 ENCOUNTER — Encounter: Payer: Self-pay | Admitting: Family

## 2023-06-22 ENCOUNTER — Other Ambulatory Visit: Payer: Self-pay

## 2023-06-22 MED ORDER — ONDANSETRON HCL 4 MG PO TABS: 4.0000 mg | ORAL_TABLET | Freq: Two times a day (BID) | ORAL | 1 refills | Status: DC | PRN

## 2023-06-22 NOTE — Telephone Encounter (Signed)
 Prescription was sent to local pharmacy at patient's request

## 2023-06-26 DIAGNOSIS — M791 Myalgia, unspecified site: Secondary | ICD-10-CM | POA: Diagnosis not present

## 2023-06-26 DIAGNOSIS — M47896 Other spondylosis, lumbar region: Secondary | ICD-10-CM | POA: Diagnosis not present

## 2023-06-26 DIAGNOSIS — M48062 Spinal stenosis, lumbar region with neurogenic claudication: Secondary | ICD-10-CM | POA: Diagnosis not present

## 2023-06-26 DIAGNOSIS — M5412 Radiculopathy, cervical region: Secondary | ICD-10-CM | POA: Diagnosis not present

## 2023-07-05 ENCOUNTER — Other Ambulatory Visit: Payer: Self-pay | Admitting: Family

## 2023-07-05 DIAGNOSIS — I10 Essential (primary) hypertension: Secondary | ICD-10-CM

## 2023-07-09 ENCOUNTER — Other Ambulatory Visit: Payer: Self-pay | Admitting: Family

## 2023-07-09 MED ORDER — CARVEDILOL 25 MG PO TABS: 25.0000 mg | ORAL_TABLET | Freq: Two times a day (BID) | ORAL | 0 refills | Status: DC

## 2023-07-12 ENCOUNTER — Other Ambulatory Visit: Payer: Self-pay | Admitting: Family

## 2023-07-29 ENCOUNTER — Other Ambulatory Visit: Payer: Self-pay | Admitting: Family

## 2023-08-03 ENCOUNTER — Other Ambulatory Visit: Payer: Self-pay | Admitting: Family

## 2023-09-08 ENCOUNTER — Other Ambulatory Visit: Payer: Self-pay | Admitting: Family

## 2023-10-03 ENCOUNTER — Other Ambulatory Visit: Payer: Self-pay | Admitting: Family

## 2023-10-03 DIAGNOSIS — I10 Essential (primary) hypertension: Secondary | ICD-10-CM

## 2023-10-18 ENCOUNTER — Other Ambulatory Visit: Payer: Self-pay | Admitting: Family

## 2023-10-23 ENCOUNTER — Other Ambulatory Visit: Payer: Self-pay | Admitting: Family

## 2023-10-23 ENCOUNTER — Other Ambulatory Visit (HOSPITAL_BASED_OUTPATIENT_CLINIC_OR_DEPARTMENT_OTHER): Payer: Self-pay | Admitting: Family

## 2023-10-23 DIAGNOSIS — I10 Essential (primary) hypertension: Secondary | ICD-10-CM

## 2023-10-23 DIAGNOSIS — Z1231 Encounter for screening mammogram for malignant neoplasm of breast: Secondary | ICD-10-CM

## 2023-10-24 NOTE — Telephone Encounter (Signed)
Please call patient to schedule follow up

## 2023-11-02 DIAGNOSIS — M4322 Fusion of spine, cervical region: Secondary | ICD-10-CM | POA: Diagnosis not present

## 2023-11-02 DIAGNOSIS — M963 Postlaminectomy kyphosis: Secondary | ICD-10-CM | POA: Diagnosis not present

## 2023-11-02 DIAGNOSIS — M5431 Sciatica, right side: Secondary | ICD-10-CM | POA: Diagnosis not present

## 2023-11-02 DIAGNOSIS — M5116 Intervertebral disc disorders with radiculopathy, lumbar region: Secondary | ICD-10-CM | POA: Diagnosis not present

## 2023-11-02 DIAGNOSIS — M4316 Spondylolisthesis, lumbar region: Secondary | ICD-10-CM | POA: Diagnosis not present

## 2023-11-06 ENCOUNTER — Ambulatory Visit (HOSPITAL_BASED_OUTPATIENT_CLINIC_OR_DEPARTMENT_OTHER)

## 2023-11-14 DIAGNOSIS — R932 Abnormal findings on diagnostic imaging of liver and biliary tract: Secondary | ICD-10-CM | POA: Diagnosis not present

## 2023-11-14 DIAGNOSIS — M4805 Spinal stenosis, thoracolumbar region: Secondary | ICD-10-CM | POA: Diagnosis not present

## 2023-11-14 DIAGNOSIS — M4807 Spinal stenosis, lumbosacral region: Secondary | ICD-10-CM | POA: Diagnosis not present

## 2023-11-14 DIAGNOSIS — M4725 Other spondylosis with radiculopathy, thoracolumbar region: Secondary | ICD-10-CM | POA: Diagnosis not present

## 2023-11-14 DIAGNOSIS — M4726 Other spondylosis with radiculopathy, lumbar region: Secondary | ICD-10-CM | POA: Diagnosis not present

## 2023-11-14 DIAGNOSIS — M4727 Other spondylosis with radiculopathy, lumbosacral region: Secondary | ICD-10-CM | POA: Diagnosis not present

## 2023-11-14 DIAGNOSIS — M48061 Spinal stenosis, lumbar region without neurogenic claudication: Secondary | ICD-10-CM | POA: Diagnosis not present

## 2023-11-14 DIAGNOSIS — R600 Localized edema: Secondary | ICD-10-CM | POA: Diagnosis not present

## 2023-11-19 ENCOUNTER — Other Ambulatory Visit: Payer: Self-pay | Admitting: Family

## 2023-11-20 ENCOUNTER — Encounter (HOSPITAL_BASED_OUTPATIENT_CLINIC_OR_DEPARTMENT_OTHER): Payer: Self-pay

## 2023-11-20 ENCOUNTER — Ambulatory Visit (HOSPITAL_BASED_OUTPATIENT_CLINIC_OR_DEPARTMENT_OTHER)
Admission: RE | Admit: 2023-11-20 | Discharge: 2023-11-20 | Disposition: A | Discharge status: Home / Self Care | Source: Ambulatory Visit | Attending: Family | Admitting: Family

## 2023-11-20 DIAGNOSIS — Z1231 Encounter for screening mammogram for malignant neoplasm of breast: Secondary | ICD-10-CM | POA: Insufficient documentation

## 2023-11-27 DIAGNOSIS — E119 Type 2 diabetes mellitus without complications: Secondary | ICD-10-CM | POA: Diagnosis not present

## 2023-11-27 DIAGNOSIS — H524 Presbyopia: Secondary | ICD-10-CM | POA: Diagnosis not present

## 2023-11-27 DIAGNOSIS — H43823 Vitreomacular adhesion, bilateral: Secondary | ICD-10-CM | POA: Diagnosis not present

## 2023-11-27 DIAGNOSIS — H52223 Regular astigmatism, bilateral: Secondary | ICD-10-CM | POA: Diagnosis not present

## 2023-11-27 DIAGNOSIS — H2513 Age-related nuclear cataract, bilateral: Secondary | ICD-10-CM | POA: Diagnosis not present

## 2023-11-30 DIAGNOSIS — M963 Postlaminectomy kyphosis: Secondary | ICD-10-CM | POA: Diagnosis not present

## 2023-11-30 DIAGNOSIS — M5116 Intervertebral disc disorders with radiculopathy, lumbar region: Secondary | ICD-10-CM | POA: Diagnosis not present

## 2023-11-30 DIAGNOSIS — M961 Postlaminectomy syndrome, not elsewhere classified: Secondary | ICD-10-CM | POA: Diagnosis not present

## 2023-11-30 DIAGNOSIS — M4316 Spondylolisthesis, lumbar region: Secondary | ICD-10-CM | POA: Diagnosis not present

## 2023-11-30 DIAGNOSIS — M48062 Spinal stenosis, lumbar region with neurogenic claudication: Secondary | ICD-10-CM | POA: Diagnosis not present

## 2023-11-30 DIAGNOSIS — M4726 Other spondylosis with radiculopathy, lumbar region: Secondary | ICD-10-CM | POA: Diagnosis not present

## 2023-12-12 ENCOUNTER — Other Ambulatory Visit (HOSPITAL_COMMUNITY)
Admission: RE | Admit: 2023-12-12 | Discharge: 2023-12-12 | Disposition: A | Discharge status: Home / Self Care | Source: Ambulatory Visit | Attending: Family | Admitting: Family

## 2023-12-12 ENCOUNTER — Ambulatory Visit (INDEPENDENT_AMBULATORY_CARE_PROVIDER_SITE_OTHER): Admitting: Family

## 2023-12-12 ENCOUNTER — Telehealth: Payer: Self-pay | Admitting: Family

## 2023-12-12 VITALS — BP 125/59 | HR 82 | Temp 97.4°F | Resp 16 | Ht 62.0 in | Wt 180.0 lb

## 2023-12-12 DIAGNOSIS — F32A Depression, unspecified: Secondary | ICD-10-CM

## 2023-12-12 DIAGNOSIS — Z23 Encounter for immunization: Secondary | ICD-10-CM | POA: Diagnosis not present

## 2023-12-12 DIAGNOSIS — I10 Essential (primary) hypertension: Secondary | ICD-10-CM

## 2023-12-12 DIAGNOSIS — G8929 Other chronic pain: Secondary | ICD-10-CM | POA: Diagnosis not present

## 2023-12-12 DIAGNOSIS — N898 Other specified noninflammatory disorders of vagina: Secondary | ICD-10-CM | POA: Insufficient documentation

## 2023-12-12 DIAGNOSIS — K7689 Other specified diseases of liver: Secondary | ICD-10-CM | POA: Diagnosis not present

## 2023-12-12 DIAGNOSIS — E119 Type 2 diabetes mellitus without complications: Secondary | ICD-10-CM | POA: Diagnosis not present

## 2023-12-12 DIAGNOSIS — M5441 Lumbago with sciatica, right side: Secondary | ICD-10-CM

## 2023-12-12 DIAGNOSIS — E1142 Type 2 diabetes mellitus with diabetic polyneuropathy: Secondary | ICD-10-CM

## 2023-12-12 DIAGNOSIS — Z8639 Personal history of other endocrine, nutritional and metabolic disease: Secondary | ICD-10-CM | POA: Diagnosis not present

## 2023-12-12 DIAGNOSIS — R8290 Unspecified abnormal findings in urine: Secondary | ICD-10-CM

## 2023-12-12 DIAGNOSIS — E876 Hypokalemia: Secondary | ICD-10-CM

## 2023-12-12 DIAGNOSIS — Z87891 Personal history of nicotine dependence: Secondary | ICD-10-CM

## 2023-12-12 DIAGNOSIS — K219 Gastro-esophageal reflux disease without esophagitis: Secondary | ICD-10-CM

## 2023-12-12 DIAGNOSIS — N83201 Unspecified ovarian cyst, right side: Secondary | ICD-10-CM

## 2023-12-12 MED ORDER — METFORMIN HCL 500 MG PO TABS: 500.0000 mg | ORAL_TABLET | Freq: Two times a day (BID) | ORAL | 1 refills | Status: DC

## 2023-12-12 MED ORDER — AMLODIPINE BESYLATE 2.5 MG PO TABS: 2.5000 mg | ORAL_TABLET | Freq: Every day | ORAL | 1 refills | Status: DC

## 2023-12-12 MED ORDER — HYDRALAZINE HCL 25 MG PO TABS: 25.0000 mg | ORAL_TABLET | Freq: Three times a day (TID) | ORAL | 1 refills | Status: AC

## 2023-12-12 MED ORDER — PANTOPRAZOLE SODIUM 40 MG PO TBEC: 40.0000 mg | DELAYED_RELEASE_TABLET | Freq: Every day | ORAL | 1 refills | Status: AC

## 2023-12-12 MED ORDER — POTASSIUM CHLORIDE CRYS ER 20 MEQ PO TBCR: 20.0000 meq | EXTENDED_RELEASE_TABLET | Freq: Every day | ORAL | 1 refills | Status: AC

## 2023-12-12 MED ORDER — HYDROCHLOROTHIAZIDE 25 MG PO TABS: 25.0000 mg | ORAL_TABLET | Freq: Every day | ORAL | 1 refills | Status: AC

## 2023-12-12 MED ORDER — GABAPENTIN 300 MG PO CAPS: 300.0000 mg | ORAL_CAPSULE | Freq: Three times a day (TID) | ORAL | 1 refills | Status: AC

## 2023-12-12 NOTE — Patient Instructions (Addendum)
 VISIT SUMMARY:  During your visit, we discussed your ongoing back pain, diabetes management, hypertension, GERD, adnexal cysts, and unintentional weight loss. We have scheduled surgery to address your back pain and provided recommendations for managing your other conditions.  YOUR PLAN:  LUMBAR SPINAL STENOSIS WITH RADICULOPATHY AND BONE SPUR: You have chronic severe right leg pain due to spinal stenosis and a bone spur compressing a nerve. -Proceed with scheduled surgery on November 17th to remove the bone spur.  TYPE 2 DIABETES MELLITUS WITH DIABETIC PERIPHERAL NEUROPATHY: Your diabetes is well-controlled, but you have some nerve issues affecting your feet. -Continue taking metformin . -Monitor your A1c levels to keep them below 7.5. -Perform regular foot exams.  HYPERTENSION: Your blood pressure is well-controlled with your current medications. -Continue taking your current antihypertensive medications.  GASTROESOPHAGEAL REFLUX DISEASE AND CHRONIC GASTRITIS: You have ongoing reflux and stomach issues, and you reported a strange sour smell possibly related to urination. -Continue taking pantoprazole  and ondansetron . -We will order a urine culture to check for infections. -Perform a self-administered vaginal swab to check for bacterial vaginosis.  ADNEXAL CYSTS, BILATERAL: You have persistent cysts on both sides that need follow-up imaging. -Plan for follow-up imaging for the cysts in 2027  WEIGHT LOSS:  -Monitor your weight and dietary intake. -We will reassess if the weight loss continues.  GENERAL HEALTH MAINTENANCE: You are due for some routine health screenings and vaccinations. -Order an annual lung cancer screening CT due to your smoking history. -Administer the flu vaccine. -Encourage getting a COVID booster after your event.

## 2023-12-12 NOTE — Assessment & Plan Note (Signed)
 BP Readings from Last 3 Encounters:  12/12/23 (!) 125/59  05/02/23 (!) 148/73  03/23/23 (!) 137/55   Stable on hydralazine , carvedilol , hydrochlorothiazide . Continue same.

## 2023-12-12 NOTE — Assessment & Plan Note (Signed)
 Stable on pantoprazole .

## 2023-12-12 NOTE — Assessment & Plan Note (Signed)
 Lab Results  Component Value Date   HGBA1C 6.2 05/01/2023   HGBA1C 6.5 01/30/2023   HGBA1C 6.8 (H) 10/13/2022   Lab Results  Component Value Date   LDLCALC 11 05/13/2021   CREATININE 0.89 05/11/2023   Continue metformin .

## 2023-12-12 NOTE — Assessment & Plan Note (Signed)
Mood is stable without medication.

## 2023-12-12 NOTE — Assessment & Plan Note (Signed)
 Lab Results  Component Value Date   TSH 3.08 02/08/2021   Not on medication.  Update TSH.

## 2023-12-12 NOTE — Telephone Encounter (Signed)
 Electronic request made

## 2023-12-12 NOTE — Assessment & Plan Note (Signed)
 Pain is uncontrolled. She is working with Spine/Scoliosis specialists at [facility] with plan for surgery in November- (right sided facetectomy to remove the large ascending osteophyte).

## 2023-12-12 NOTE — Telephone Encounter (Signed)
 Please request DM eye exam from [facility]

## 2023-12-12 NOTE — Assessment & Plan Note (Signed)
 Will need follow up imaging in 2027.

## 2023-12-12 NOTE — Progress Notes (Signed)
 Subjective:     Patient ID: Cassidy Harrell, female    DOB: 04-09-1950, 73 y.o.   MRN: [id number]  Chief Complaint  Patient presents with   Cyst    Here for follow up on liver cystic lesion found on MRI    HPI  Discussed the use of AI scribe software for clinical note transcription with the patient, who gave verbal consent to proceed.  History of Present Illness  Cassidy Harrell is a 73 year old female with spinal scoliosis and persistent adnexal cysts who presents for follow-up on her back pain and cyst management.  She experiences persistent back pain since spinal surgery in June of last year. The pain is constant with severe episodes that impair mobility. It is located in the same spot as before surgery and has not improved with previous treatments, including prednisone. Severe right leg pain with radiation is also present.  She has persistent adnexal cysts, measuring 2.9 cm on the right and 3.0 cm on the left. Follow-up imaging was recommended but not completed due to her surgery.  She states has lost over 20 pounds unintentionally over the past two years, currently weighing 177 pounds. She attributes this to dietary changes, including reduced meat and bread consumption. A review of her medical record shows that her weight 9/24 was 184. She will continue to monitor her weight and let us  know if she sees further unintentional weight loss.   She experiences ongoing reflux managed with pantoprazole  and occasional nausea. Her stomach feels 'unsteady' and certain foods exacerbate symptoms. She notices a strange sour smell associated with either urination or possibly a vaginal odor but denies dysuria.  Her medical history includes well-controlled hypertension and type 2 diabetes, managed with amlodipine , hydralazine , hydrochlorothiazide , and metformin . She quit smoking in 2011. She manages a family history of depression by staying active with nonprofit work and family  activities.     Health Maintenance Due  Topic Date Due   Diabetic kidney evaluation - Urine ACR  Never done   DTaP/Tdap/Td (2 - Td or Tdap) 04/02/2023   OPHTHALMOLOGY EXAM  07/14/2023   COVID-19 Vaccine (7 - 2025-26 season) 10/29/2023   HEMOGLOBIN A1C  11/01/2023   Lung Cancer Screening  11/16/2023   Medicare Annual Wellness (AWV)  12/06/2023    Past Medical History:  Diagnosis Date   Acute perforated appendicitis 04/02/2019   Anemia    Arthritis    Bilateral ovarian cysts    Cataract    Depression    Diabetes mellitus without complication (HCC)    Dysrhythmia    Over 10 years ago   Fatty liver    GERD (gastroesophageal reflux disease)    Gum disease    Heart murmur    History of syphilis    Hypertension    Irregular heart beat    Personal history of colonic polyps    Phlebitis    Pre-diabetes     Past Surgical History:  Procedure Laterality Date   BACK SURGERY  08/09/2022   spine   CYSTECTOMY     between breasts   IR RADIOLOGIST EVAL & MGMT  04/15/2019   IR RADIOLOGIST EVAL & MGMT  04/29/2019   IR RADIOLOGIST EVAL & MGMT  05/08/2019   LAPAROSCOPIC APPENDECTOMY N/A 06/30/2019   Procedure: APPENDECTOMY LAPAROSCOPIC;  Surgeon: Rubin Calamity, MD;  Location: [facility] OR;  Service: General;  Laterality: N/A;   SHOULDER ARTHROSCOPY Right 12/2015   SPINAL FUSION  2014   SPINE SURGERY  08/20/2015   bulging / herniated disc in C-spine   THERAPEUTIC ABORTION  1970. 1974, 1978    Family History  Problem Relation Age of Onset   Diabetes Mother    Pancreatic cancer Mother    Cancer Father        lung cancer   Kidney disease Father    Seizures Father    Colon cancer Brother    Heart attack Brother    Stroke Brother    Stomach cancer Neg Hx    Esophageal cancer Neg Hx    Rectal cancer Neg Hx     Social History   Socioeconomic History   Marital status: Divorced    Spouse name: Not on file   Number of children: 1   Years of education: Not on file   Highest  education level: Associate degree: academic program  Occupational History   Not on file  Tobacco Use   Smoking status: Former    Current packs/day: 0.00    Average packs/day: 1 pack/day for 40.0 years (40.0 ttl pk-yrs)    Types: Cigarettes    Start date: 15    Quit date: 2011    Years since quitting: 14.7   Smokeless tobacco: Never  Vaping Use   Vaping status: Never Used  Substance and Sexual Activity   Alcohol use: No   Drug use: No   Sexual activity: Not on file  Other Topics Concern   Not on file  Social History Narrative   Regular exercise:  No   Caffeine Use:  No   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    Divorced   Son Age 42- alive and well   2 grand daughters- age 67 and an adopted grandaughter age 65   Loves reading, walking spending time with family, 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            Social Drivers of Health   Financial Resource Strain: Medium Risk (12/05/2023)   Overall Financial Resource Strain (CARDIA)    Difficulty of Paying Living Expenses: Somewhat hard  Food Insecurity: Food Insecurity Present (12/05/2023)   Hunger Vital Sign    Worried About Running Out of Food in the Last Year: Sometimes true    Ran Out of Food in the Last Year: Sometimes true  Transportation Needs: No Transportation Needs (12/05/2023)   PRAPARE - Administrator, Civil Service (Medical): No    Lack of Transportation (Non-Medical): No  Physical Activity: Inactive (12/05/2023)   Exercise Vital Sign    Days of Exercise per Week: 0 days    Minutes of Exercise per Session: Not on file  Stress: No Stress Concern Present (12/05/2023)   [employer] of Occupational Health - Occupational Stress Questionnaire    Feeling of Stress: Not at all  Social Connections: Moderately Integrated (12/05/2023)   Social Connection and Isolation Panel    Frequency of Communication with Friends and Family:  More than three times a week    Frequency of Social Gatherings with Friends and Family: More than three times a week    Attends Religious Services: More than 4 times per year    Active Member of [employer] or Organizations: Yes    Attends Banker Meetings: More than 4 times per year    Marital Status: Divorced  Intimate Partner Violence: Not At Risk (12/06/2022)   Humiliation, Afraid, Rape, and Kick questionnaire    Fear of Current  or Ex-Partner: No    Emotionally Abused: No    Physically Abused: No    Sexually Abused: No    Outpatient Medications Prior to Visit  Medication Sig Dispense Refill   acetaminophen  (TYLENOL ) 650 MG CR tablet Take 650 mg by mouth in the morning and at bedtime.     aspirin EC 81 MG tablet Take 81 mg by mouth daily with lunch.     baclofen (LIORESAL) 10 MG tablet Take 10 mg by mouth 3 (three) times daily as needed.     blood glucose meter kit and supplies KIT Dispense based on patient and insurance preference. Use up to four times daily as directed. (FOR ICD-9 250.00, 250.01). 1 each 0   calcium carbonate (OSCAL) 1500 (600 Ca) MG TABS tablet Take 1,200 mg by mouth daily with lunch. 800 units vitamin D     carvedilol  (COREG ) 25 MG tablet TAKE 1 TABLET BY MOUTH TWICE  DAILY WITH A MEAL 180 tablet 1   glucose blood (ONETOUCH VERIO) test strip Check blood sugars daily 100 each 12   Iron , Ferrous Sulfate , 325 (65 Fe) MG TABS Take 325 mg by mouth every other day.     LUTEIN PO Take 1 tablet by mouth every morning.     Multiple Vitamins-Minerals (MULTIVITAMIN WITH MINERALS) tablet Take 1 tablet by mouth daily.     ondansetron  (ZOFRAN ) 4 MG tablet TAKE 1 TABLET BY MOUTH TWICE  DAILY AS NEEDED 60 tablet 0   polyethylene glycol (MIRALAX / GLYCOLAX) 17 g packet Take 17 g by mouth daily as needed.     amLODipine  (NORVASC ) 2.5 MG tablet TAKE 1 TABLET BY MOUTH EVERY DAY 90 tablet 0   gabapentin  (NEURONTIN ) 300 MG capsule TAKE 1 CAPSULE BY MOUTH 3 TIMES DAILY AS NEEDED.  90 capsule 3   hydrALAZINE  (APRESOLINE ) 25 MG tablet TAKE 1 TABLET BY MOUTH 3 TIMES  DAILY 300 tablet 0   hydrochlorothiazide  (HYDRODIURIL ) 25 MG tablet TAKE 1 TABLET BY MOUTH DAILY 100 tablet 0   metFORMIN  (GLUCOPHAGE ) 500 MG tablet Take 1 tablet (500 mg total) by mouth 2 (two) times daily with a meal. 180 tablet 0   pantoprazole  (PROTONIX ) 40 MG tablet TAKE 1 TABLET BY MOUTH DAILY 100 tablet 0   potassium chloride  SA (KLOR-CON  M) 20 MEQ tablet TAKE 1 TABLET BY MOUTH EVERY DAY 90 tablet 0   No facility-administered medications prior to visit.    Allergies  Allergen Reactions   Aldactone  [Spironolactone ] Other (See Comments)    itching   Hydrocodone-Acetaminophen  Itching   Lisinopril      Throat itching / cough    ROS See HPI    Objective:    Physical Exam Constitutional:      General: She is not in acute distress.    Appearance: Normal appearance. She is well-developed.  HENT:     Head: Normocephalic and atraumatic.     Right Ear: External ear normal.     Left Ear: External ear normal.  Eyes:     General: No scleral icterus. Neck:     Thyroid : No thyromegaly.  Cardiovascular:     Rate and Rhythm: Normal rate and regular rhythm.     Heart sounds: Normal heart sounds. No murmur heard. Pulmonary:     Effort: Pulmonary effort is normal. No respiratory distress.     Breath sounds: Normal breath sounds. No wheezing.  Musculoskeletal:     Cervical back: Neck supple.  Skin:    General: Skin is warm  and dry.  Neurological:     Mental Status: She is alert and oriented to person, place, and time.  Psychiatric:        Mood and Affect: Mood normal.        Behavior: Behavior normal.        Thought Content: Thought content normal.        Judgment: Judgment normal.    Diabetic Foot Exam - Simple   Simple Foot Form Diabetic Foot exam was performed with the following findings: Yes   Visual Inspection No deformities, no ulcerations, no other skin breakdown bilaterally:  Yes Sensation Testing See comments: Yes Pulse Check Posterior Tibialis and Dorsalis pulse intact bilaterally: Yes Comments Diminished sensation to monofilament bilaterally       BP (!) 125/59 (BP Location: Left Wrist, Patient Position: Sitting, Cuff Size: Large)   Pulse 82   Temp (!) 97.4 F (36.3 C) (Oral)   Resp 16   Ht 5' 2 (1.575 m)   Wt 180 lb (81.6 kg)   SpO2 99%   BMI 32.92 kg/m  Wt Readings from Last 3 Encounters:  12/12/23 180 lb (81.6 kg)  05/01/23 180 lb (81.6 kg)  03/23/23 186 lb (84.4 kg)       Assessment & Plan:   Problem List Items Addressed This Visit       Unprioritized   Hypokalemia   Relevant Medications   potassium chloride  SA (KLOR-CON  M) 20 MEQ tablet   HTN (hypertension)   BP Readings from Last 3 Encounters:  12/12/23 (!) 125/59  05/02/23 (!) 148/73  03/23/23 (!) 137/55   Stable on hydralazine , carvedilol , hydrochlorothiazide . Continue same.       Relevant Medications   hydrochlorothiazide  (HYDRODIURIL ) 25 MG tablet   amLODipine  (NORVASC ) 2.5 MG tablet   hydrALAZINE  (APRESOLINE ) 25 MG tablet   History of hypothyroidism   Lab Results  Component Value Date   TSH 3.08 02/08/2021   Not on medication.  Update TSH.      Relevant Orders   TSH   GERD (gastroesophageal reflux disease)   Stable on pantoprazole .        Relevant Medications   pantoprazole  (PROTONIX ) 40 MG tablet   Diabetes type 2, controlled (HCC)   Lab Results  Component Value Date   HGBA1C 6.2 05/01/2023   HGBA1C 6.5 01/30/2023   HGBA1C 6.8 (H) 10/13/2022   Lab Results  Component Value Date   LDLCALC 11 05/13/2021   CREATININE 0.89 05/11/2023   Continue metformin .       Relevant Medications   metFORMIN  (GLUCOPHAGE ) 500 MG tablet   Other Relevant Orders   HgB A1c   Urine Microalbumin w/creat. ratio   Depression   Mood is stable without medication.       Chronic right-sided low back pain with right-sided sciatica   Pain is uncontrolled. She is  working with Spine/Scoliosis specialists at [facility] with plan for surgery in November- (right sided facetectomy to remove the large ascending osteophyte).      Relevant Medications   gabapentin  (NEURONTIN ) 300 MG capsule   Bilateral ovarian cysts   Will need follow up imaging in 2027.      Other Visit Diagnoses       Liver cyst    -  Primary   Relevant Orders   US  Abdomen Limited RUQ (LIVER/GB)     Abnormal urine odor       Relevant Orders   Urine Culture     History of tobacco abuse  Relevant Orders   CT CHEST LUNG CA SCREEN LOW DOSE W/O CM     Diabetic peripheral neuropathy (HCC)       Relevant Medications   metFORMIN  (GLUCOPHAGE ) 500 MG tablet   gabapentin  (NEURONTIN ) 300 MG capsule     Vaginal odor       Relevant Orders   Cervicovaginal ancillary only( [place])     Needs flu shot       Relevant Orders   Flu vaccine HIGH DOSE PF(Fluzone Trivalent) (Completed)       I have changed Marceline D. Witty's hydrochlorothiazide , amLODipine , potassium chloride  SA, pantoprazole , hydrALAZINE , and gabapentin . I am also having her maintain her acetaminophen , blood glucose meter kit and supplies, aspirin EC, calcium carbonate, multivitamin with minerals, polyethylene glycol, OneTouch Verio, Iron  (Ferrous Sulfate ), baclofen, LUTEIN PO, carvedilol , ondansetron , and metFORMIN .  Meds ordered this encounter  Medications   hydrochlorothiazide  (HYDRODIURIL ) 25 MG tablet    Sig: Take 1 tablet (25 mg total) by mouth daily.    Dispense:  100 tablet    Refill:  1    Please send a replace/new response with 100-Day Supply if appropriate to maximize member benefit. Requesting 1 year supply.    Supervising Provider:   DOMENICA BLACKBIRD A [[id number]]   amLODipine  (NORVASC ) 2.5 MG tablet    Sig: Take 1 tablet (2.5 mg total) by mouth daily.    Dispense:  100 tablet    Refill:  1    Supervising Provider:   DOMENICA BLACKBIRD A [[id number]]   potassium chloride  SA (KLOR-CON  M) 20 MEQ tablet    Sig: Take 1  tablet (20 mEq total) by mouth daily.    Dispense:  100 tablet    Refill:  1    Supervising Provider:   DOMENICA BLACKBIRD A [[id number]]   pantoprazole  (PROTONIX ) 40 MG tablet    Sig: Take 1 tablet (40 mg total) by mouth daily.    Dispense:  100 tablet    Refill:  1    Please send a replace/new response with 100-Day Supply if appropriate to maximize member benefit. Requesting 1 year supply.    Supervising Provider:   DOMENICA BLACKBIRD A [[id number]]   metFORMIN  (GLUCOPHAGE ) 500 MG tablet    Sig: Take 1 tablet (500 mg total) by mouth 2 (two) times daily with a meal.    Dispense:  200 tablet    Refill:  1    Supervising Provider:   DOMENICA BLACKBIRD A [[id number]]   hydrALAZINE  (APRESOLINE ) 25 MG tablet    Sig: Take 1 tablet (25 mg total) by mouth 3 (three) times daily.    Dispense:  300 tablet    Refill:  1    Please send a replace/new response with 100-Day Supply if appropriate to maximize member benefit. Requesting 1 year supply.    Supervising Provider:   DOMENICA BLACKBIRD A [[id number]]   gabapentin  (NEURONTIN ) 300 MG capsule    Sig: Take 1 capsule (300 mg total) by mouth 3 (three) times daily.    Dispense:  300 capsule    Refill:  1    Supervising Provider:   DOMENICA BLACKBIRD A [[id number]]

## 2023-12-13 LAB — URINE CULTURE
MICRO NUMBER:: 17102766
Result:: NO GROWTH
SPECIMEN QUALITY:: ADEQUATE

## 2023-12-13 LAB — CERVICOVAGINAL ANCILLARY ONLY
Bacterial Vaginitis (gardnerella): NEGATIVE
Candida Glabrata: NEGATIVE
Candida Vaginitis: NEGATIVE
Comment: NEGATIVE
Comment: NEGATIVE
Comment: NEGATIVE

## 2023-12-13 LAB — HEMOGLOBIN A1C: Hgb A1c MFr Bld: 6.4 % (ref 4.6–6.5)

## 2023-12-13 LAB — TSH: TSH: 3 u[IU]/mL (ref 0.35–5.50)

## 2023-12-13 LAB — MICROALBUMIN / CREATININE URINE RATIO
Creatinine,U: 176 mg/dL
Microalb Creat Ratio: 12.2 mg/g (ref 0.0–30.0)
Microalb, Ur: 2.2 mg/dL — ABNORMAL HIGH (ref 0.0–1.9)

## 2023-12-14 ENCOUNTER — Ambulatory Visit: Payer: Self-pay | Admitting: Family

## 2023-12-14 DIAGNOSIS — E119 Type 2 diabetes mellitus without complications: Secondary | ICD-10-CM

## 2023-12-14 DIAGNOSIS — R809 Proteinuria, unspecified: Secondary | ICD-10-CM

## 2023-12-14 NOTE — Telephone Encounter (Signed)
 There is some protein in her urine from the diabetes.  I would like to have her add farxiga once daily to help protect her kidneys. It is also helpful for her sugar.   A1C is at goal.   Urine culture and vaginal swab are both negative.

## 2023-12-14 NOTE — Progress Notes (Signed)
 Patient notified of results new medication and provider's comments

## 2023-12-16 ENCOUNTER — Ambulatory Visit (HOSPITAL_BASED_OUTPATIENT_CLINIC_OR_DEPARTMENT_OTHER)
Admission: RE | Admit: 2023-12-16 | Discharge: 2023-12-16 | Disposition: A | Discharge status: Home / Self Care | Source: Ambulatory Visit | Attending: Family | Admitting: Family

## 2023-12-16 DIAGNOSIS — Z87891 Personal history of nicotine dependence: Secondary | ICD-10-CM | POA: Insufficient documentation

## 2023-12-16 DIAGNOSIS — K7689 Other specified diseases of liver: Secondary | ICD-10-CM

## 2023-12-17 ENCOUNTER — Encounter: Payer: Self-pay | Admitting: Family

## 2023-12-17 ENCOUNTER — Other Ambulatory Visit: Payer: Self-pay | Admitting: Family

## 2023-12-19 MED ORDER — DAPAGLIFLOZIN PROPANEDIOL 10 MG PO TABS: 10.0000 mg | ORAL_TABLET | Freq: Every day | ORAL | 0 refills | Status: DC

## 2023-12-22 ENCOUNTER — Other Ambulatory Visit: Payer: Self-pay | Admitting: Family

## 2023-12-24 ENCOUNTER — Telehealth: Payer: Self-pay

## 2023-12-24 DIAGNOSIS — E119 Type 2 diabetes mellitus without complications: Secondary | ICD-10-CM

## 2023-12-24 DIAGNOSIS — R809 Proteinuria, unspecified: Secondary | ICD-10-CM

## 2023-12-24 NOTE — Telephone Encounter (Signed)
 Copied from CRM [id number]. Topic: Clinical - Medication Question >> Dec 24, 2023 10:33 AM Tinnie BROCKS wrote: Reason for CRM: Call is coming from Mira with the [facility]. Pt is wanting to relay message to Melissa: She tried to pick up the Farxiga last week at the pharmacy and it was too expensive for her. Needs something else. Please call pt back on this.  FYI- Also, they spoke with the pt about the benefits of being on a statin for diabetics and the pt is interested in having a consultation for a statin at her upcoming appointment in February. [employer] is faxing some information today on this.

## 2023-12-26 MED ORDER — EMPAGLIFLOZIN 10 MG PO TABS: 10.0000 mg | ORAL_TABLET | Freq: Every day | ORAL | 3 refills | Status: AC

## 2023-12-26 NOTE — Telephone Encounter (Signed)
 Let's see if Cassidy Harrell is more affordable instead of farxiga.  Both of these drugs help protect her kidneys and can help blood sugar.

## 2023-12-26 NOTE — Telephone Encounter (Signed)
 Patient notified of new prescription.

## 2023-12-26 NOTE — Addendum Note (Signed)
 Addended by: DARYL SETTER on: 12/26/2023 12:45 PM   Modules accepted: Orders

## 2023-12-30 ENCOUNTER — Other Ambulatory Visit: Payer: Self-pay | Admitting: Family

## 2023-12-30 DIAGNOSIS — E119 Type 2 diabetes mellitus without complications: Secondary | ICD-10-CM

## 2023-12-30 DIAGNOSIS — I10 Essential (primary) hypertension: Secondary | ICD-10-CM

## 2024-01-14 ENCOUNTER — Telehealth: Payer: Self-pay | Admitting: Family

## 2024-01-14 NOTE — Telephone Encounter (Signed)
 Copied from CRM #[id number]. Topic: Medicare AWV >> Jan 14, 2024  2:39 PM Nathanel DEL wrote: Called LVM 01/14/2024 to sched AWV. Please schedule in office or virtual visit.   Nathanel Paschal; Care Guide Ambulatory Clinical Support [place] [facility] Direct Dial: [phone]

## 2024-01-15 ENCOUNTER — Encounter: Payer: Self-pay | Admitting: *Deleted

## 2024-01-15 NOTE — Progress Notes (Signed)
 Cassidy Harrell                                          MRN: [id number]   01/15/2024   The [facility]BCI Quality Team Specialist reviewed this patient medical record for the purposes of chart review for care gap closure. The following were reviewed: abstraction for care gap closure-kidney health evaluation for diabetes:eGFR  and uACR.    VBCI Quality Team

## 2024-01-22 ENCOUNTER — Encounter: Payer: Self-pay | Admitting: Family

## 2024-02-12 ENCOUNTER — Encounter (INDEPENDENT_AMBULATORY_CARE_PROVIDER_SITE_OTHER): Payer: Self-pay | Admitting: Family

## 2024-02-12 ENCOUNTER — Ambulatory Visit: Payer: Self-pay

## 2024-02-12 DIAGNOSIS — N3001 Acute cystitis with hematuria: Secondary | ICD-10-CM | POA: Insufficient documentation

## 2024-02-12 MED ORDER — CEPHALEXIN 500 MG PO CAPS: 500.0000 mg | ORAL_CAPSULE | Freq: Two times a day (BID) | ORAL | 0 refills | Status: AC

## 2024-02-12 NOTE — Telephone Encounter (Signed)

## 2024-02-12 NOTE — Telephone Encounter (Signed)
 FYI Only or Action Required?: FYI only for provider: patient on triage line during same time provider messaging was received.  Plan reviewed with patient, plans to pick up antibiotic, declines appointment, no triage necessary at this time .  Patient was last seen in primary care on 12/12/2023 by Daryl Setter, NP.  Called Nurse Triage reporting Dysuria.  Triage Disposition: Duplicate Contact Calls  Patient/caregiver understands and will follow disposition?: Yes     Copied from CRM [id number]. Topic: Clinical - Red Word Triage >> Feb 12, 2024 12:48 PM Harlene ORN wrote: Red Word that prompted transfer to Nurse Triage: sent PCP an email a couple hours ago had surgery on her back on 11/14, still recovering believes she has a UTI that is very painful   Reason for Disposition  Caller has already spoken with the doctor (or NP/PA, pharmacist) and has no further questions.  Answer Assessment - Initial Assessment Questions Upon acceptance of call, patient reports she has been messaging provider via MyChart and situation has been addressed.  Provider has sent over a prescription to [facility] to treat UTI.  Patient declines scheduling with provider at this time, states plan to see Surgeon on Thursday for follow-up and if not better will then also see PCP, Dr. Daryl.  Advised to call back with any changes or worsening of symptoms.  Answer Assessment - Initial Assessment Questions 1. REASON FOR CALL: What is the main reason for your call? or How can I best help you?     Upon acceptance of call, patient reports she has been messaging provider via MyChart and situation has been addressed.  Provider has sent over a prescription to [facility] to treat UTI.  Patient declines scheduling with provider at this time, states plan to see Surgeon on Thursday for follow-up and if not better will then also see PCP, Dr. Daryl.  Advised to call back with any changes or worsening of symptoms.  2. SYMPTOMS : Do  you have any symptoms?      Urinary symptoms- addressed by provider.   3. OTHER QUESTIONS: Do you have any other questions?     Denies  Answer Assessment - Initial Assessment Questions Upon acceptance of call, patient reports she has been messaging provider via MyChart and situation has been addressed.  Provider has sent over a prescription to [facility] to treat UTI.  Patient declines scheduling with provider at this time, states plan to see Surgeon on Thursday for follow-up and if not better will then also see PCP, Dr. Daryl.  Advised to call back with any changes or worsening of symptoms.  Protocols used: Urination Pain - Female-A-AH, Information Only Call - No Triage-A-AH, No Contact or Duplicate Contact Call-A-AH

## 2024-03-26 ENCOUNTER — Ambulatory Visit

## 2024-03-26 VITALS — BP 125/60 | Ht 62.0 in | Wt 178.0 lb

## 2024-03-26 DIAGNOSIS — Z0000 Encounter for general adult medical examination without abnormal findings: Secondary | ICD-10-CM | POA: Diagnosis not present

## 2024-03-26 NOTE — Patient Instructions (Signed)
 Cassidy Harrell,  Thank you for taking the time for your Medicare Wellness Visit. I appreciate your continued commitment to your health goals. Please review the care plan we discussed, and feel free to reach out if I can assist you further.  Please note that Annual Wellness Visits do not include a physical exam. Some assessments may be limited, especially if the visit was conducted virtually. If needed, we may recommend an in-person follow-up with your provider.  Ongoing Care Seeing your primary care provider every 3 to 6 months helps us  monitor your health and provide consistent, personalized care.   Referrals If a referral was made during today's visit and you haven't received any updates within two weeks, please contact the referred provider directly to check on the status.  Recommended Screenings:  Health Maintenance  Topic Date Due   DTaP/Tdap/Td vaccine (2 - Td or Tdap) 04/02/2023   COVID-19 Vaccine (7 - 2025-26 season) 10/29/2023   Medicare Annual Wellness Visit  12/06/2023   Yearly kidney function blood test for diabetes  05/10/2024   Kidney health urinalysis for diabetes  06/11/2024   Hemoglobin A1C  06/11/2024   Breast Cancer Screening  11/19/2024   Eye exam for diabetics  11/26/2024   Complete foot exam   12/11/2024   Colon Cancer Screening  01/16/2026   Pneumococcal Vaccine for age over 65  Completed   Flu Shot  Completed   Osteoporosis screening with Bone Density Scan  Completed   Hepatitis C Screening  Completed   Zoster (Shingles) Vaccine  Completed   Meningitis B Vaccine  Aged Out   Screening for Lung Cancer  Discontinued       12/06/2022    3:54 PM  Advanced Directives  Does Patient Have a Medical Advance Directive? Yes  Type of Advance Directive Living will  Does patient want to make changes to medical advance directive? No - Patient declined    Vision: Annual vision screenings are recommended for early detection of glaucoma, cataracts, and diabetic  retinopathy. These exams can also reveal signs of chronic conditions such as diabetes and high blood pressure.  Dental: Annual dental screenings help detect early signs of oral cancer, gum disease, and other conditions linked to overall health, including heart disease and diabetes.  Please see the attached documents for additional preventive care recommendations.

## 2024-03-26 NOTE — Progress Notes (Signed)
 " I connected with  Cassidy Harrell on 03/26/24 by a audio enabled telemedicine application and verified that I am speaking with the correct person using two identifiers.  Patient Location: Home  Provider Location: Home Office  Persons Participating in Visit: Patient.  I discussed the limitations of evaluation and management by telemedicine. The patient expressed understanding and agreed to proceed.  Vital Signs: Because this visit was a virtual/telehealth visit, some criteria may be missing or patient reported. Any vitals not documented were not able to be obtained and vitals that have been documented are patient reported.  Chief Complaint  Patient presents with   Medicare Wellness     Subjective:   Cassidy Harrell is a 74 y.o. female who presents for a Medicare Annual Wellness Visit.  Visit info / Clinical Intake: Medicare Wellness Visit Type:: Subsequent Annual Wellness Visit Persons participating in visit and providing information:: patient Medicare Wellness Visit Mode:: Telephone If telephone:: video declined If Telephone or Video please confirm:: I connected with patient using audio/video enable telemedicine. I verified patient identity with two identifiers, discussed telehealth limitations, and patient agreed to proceed. Patient Location:: home Provider Location:: home office Interpreter Needed?: No Pre-visit prep was completed: yes AWV questionnaire completed by patient prior to visit?: yes Date:: 03/24/24 Living arrangements:: (!) lives alone Patient's Overall Health Status Rating: very good Typical amount of pain: some Does pain affect daily life?: no Are you currently prescribed opioids?: no  Dietary Habits and Nutritional Risks How many meals a day?: 2 Eats fruit and vegetables daily?: yes Most meals are obtained by: preparing own meals In the last 2 weeks, have you had any of the following?: none Diabetic:: no  Functional Status Activities of Daily Living  (to include ambulation/medication): Independent Ambulation: Independent with device- listed below Home Assistive Devices/Equipment: Cane Medication Administration: Independent Home Management (perform basic housework or laundry): Independent Manage your own finances?: yes Primary transportation is: driving Concerns about vision?: no *vision screening is required for WTM* Concerns about hearing?: no  Fall Screening Falls in the past year?: 0 Number of falls in past year: 0 Was there an injury with Fall?: 0 Fall Risk Category Calculator: 0 Patient Fall Risk Level: Low Fall Risk  Fall Risk Patient at Risk for Falls Due to: Impaired mobility; Impaired balance/gait Fall risk Follow up: Falls evaluation completed  Home and Transportation Safety: All rugs have non-skid backing?: N/A, no rugs All stairs or steps have railings?: N/A, no stairs Grab bars in the bathtub or shower?: yes Have non-skid surface in bathtub or shower?: yes Good home lighting?: yes Regular seat belt use?: yes Hospital stays in the last year:: (!) yes How many hospital stays:: 1 Reason: surgery  Cognitive Assessment Difficulty concentrating, remembering, or making decisions? : no Will 6CIT or Mini Cog be Completed: yes What year is it?: 0 points What month is it?: 0 points Give patient an address phrase to remember (5 components): remember words apple , table , penny About what time is it?: 0 points Count backwards from 20 to 1: 0 points Say the months of the year in reverse: 0 points Repeat the address phrase from earlier: 0 points 6 CIT Score: 0 points  Advance Directives (For Healthcare) Does Patient Have a Medical Advance Directive?: Yes Does patient want to make changes to medical advance directive?: No - Patient declined Type of Advance Directive: Healthcare Power of [place]; Living will; Out of facility DNR (pink MOST or yellow form) Copy of Healthcare Power of  Attorney in Chart?: No - copy  requested Copy of Living Will in Chart?: No - copy requested Out of facility DNR (pink MOST or yellow form) in Chart? (Ambulatory ONLY): No - copy requested  Reviewed/Updated  Reviewed/Updated: Reviewed All (Medical, Surgical, Family, Medications, Allergies, Care Teams, Patient Goals)    Allergies (verified) Aldactone  [spironolactone ], Hydrocodone-acetaminophen , and Lisinopril    Current Medications (verified) Outpatient Encounter Medications as of 03/26/2024  Medication Sig   acetaminophen  (TYLENOL ) 650 MG CR tablet Take 650 mg by mouth in the morning and at bedtime.   amLODipine  (NORVASC ) 2.5 MG tablet TAKE 1 TABLET BY MOUTH EVERY DAY   aspirin EC 81 MG tablet Take 81 mg by mouth daily with lunch.   blood glucose meter kit and supplies KIT Dispense based on patient and insurance preference. Use up to four times daily as directed. (FOR ICD-9 250.00, 250.01).   calcium carbonate (OSCAL) 1500 (600 Ca) MG TABS tablet Take 1,200 mg by mouth daily with lunch. 800 units vitamin D   carvedilol  (COREG ) 25 MG tablet TAKE 1 TABLET BY MOUTH TWICE  DAILY WITH A MEAL   gabapentin  (NEURONTIN ) 300 MG capsule Take 1 capsule (300 mg total) by mouth 3 (three) times daily.   glucose blood (ONETOUCH VERIO) test strip Check blood sugars daily   hydrALAZINE  (APRESOLINE ) 25 MG tablet Take 1 tablet (25 mg total) by mouth 3 (three) times daily.   hydrochlorothiazide  (HYDRODIURIL ) 25 MG tablet Take 1 tablet (25 mg total) by mouth daily.   Iron , Ferrous Sulfate , 325 (65 Fe) MG TABS Take 325 mg by mouth every other day.   LUTEIN PO Take 1 tablet by mouth every morning.   metFORMIN  (GLUCOPHAGE ) 500 MG tablet TAKE 1 TABLET BY MOUTH 2 TIMES DAILY WITH A MEAL.   Multiple Vitamins-Minerals (MULTIVITAMIN WITH MINERALS) tablet Take 1 tablet by mouth daily.   ondansetron  (ZOFRAN ) 4 MG tablet TAKE 1 TABLET BY MOUTH TWICE  DAILY AS NEEDED   pantoprazole  (PROTONIX ) 40 MG tablet Take 1 tablet (40 mg total) by mouth daily.    polyethylene glycol (MIRALAX / GLYCOLAX) 17 g packet Take 17 g by mouth daily as needed.   potassium chloride  SA (KLOR-CON  M) 20 MEQ tablet Take 1 tablet (20 mEq total) by mouth daily.   baclofen (LIORESAL) 10 MG tablet Take 10 mg by mouth 3 (three) times daily as needed.   empagliflozin  (JARDIANCE ) 10 MG TABS tablet Take 1 tablet (10 mg total) by mouth daily.   No facility-administered encounter medications on file as of 03/26/2024.    History: Past Medical History:  Diagnosis Date   Acute perforated appendicitis 04/02/2019   Allergy    Anemia    Arthritis    Bilateral ovarian cysts    Cataract    Depression    Diabetes mellitus without complication (HCC)    Dysrhythmia    Over 10 years ago   Fatty liver    Fatty liver    GERD (gastroesophageal reflux disease)    Gum disease    Heart murmur    History of syphilis    Hypertension    Irregular heart beat    Personal history of colonic polyps    Phlebitis    Pre-diabetes    Past Surgical History:  Procedure Laterality Date   APPENDECTOMY     BACK SURGERY  08/09/2022   spine   CYSTECTOMY     between breasts   IR RADIOLOGIST EVAL & MGMT  04/15/2019   IR RADIOLOGIST EVAL &  MGMT  04/29/2019   IR RADIOLOGIST EVAL & MGMT  05/08/2019   LAPAROSCOPIC APPENDECTOMY N/A 06/30/2019   Procedure: APPENDECTOMY LAPAROSCOPIC;  Surgeon: Rubin Calamity, MD;  Location: [facility] OR;  Service: General;  Laterality: N/A;   LUMBAR LAMINECTOMY  01/14/2024   L3-L5 Lumbar laminectomy with fusion/instrument and allograft, Dr. Donnajean Eddy   SHOULDER ARTHROSCOPY Right 12/2015   SPINAL FUSION  2014   SPINE SURGERY  08/20/2015   bulging / herniated disc in C-spine   THERAPEUTIC ABORTION  1970. 1974, 1978   TUBAL LIGATION     Family History  Problem Relation Age of Onset   Diabetes Mother    Pancreatic cancer Mother    Hypertension Mother    Asthma Mother    Cancer Father        lung cancer   Kidney disease Father    Seizures Father     Colon cancer Brother    Hypertension Brother    Heart attack Brother    Stroke Brother    Heart failure Brother    Asthma Sister    Hypertension Sister    Hypertension Sister    Stomach cancer Neg Hx    Esophageal cancer Neg Hx    Rectal cancer Neg Hx    Social History   Occupational History   Not on file  Tobacco Use   Smoking status: Former    Current packs/day: 0.00    Average packs/day: 1 pack/day for 40.0 years (40.0 ttl pk-yrs)    Types: Cigarettes    Start date: 21    Quit date: 11/02/2009    Years since quitting: 14.4   Smokeless tobacco: Never  Vaping Use   Vaping status: Never Used  Substance and Sexual Activity   Alcohol use: Not Currently    Comment: haven't had a drink since 2006   Drug use: Never   Sexual activity: Not Currently    Birth control/protection: Abstinence    Comment: no   Tobacco Counseling Counseling given: Not Answered  SDOH Screenings   Food Insecurity: No Food Insecurity (03/24/2024)  Housing: Low Risk (03/24/2024)  Transportation Needs: No Transportation Needs (03/24/2024)  Utilities: Not At Risk (03/26/2024)  Alcohol Screen: Low Risk (12/06/2022)  Depression (PHQ2-9): Low Risk (03/26/2024)  Financial Resource Strain: Medium Risk (03/24/2024)  Physical Activity: Patient Declined (03/24/2024)  Social Connections: Moderately Integrated (03/24/2024)  Stress: No Stress Concern Present (03/24/2024)  Tobacco Use: Medium Risk (03/26/2024)  Health Literacy: Adequate Health Literacy (03/26/2024)   See flowsheets for full screening details  Depression Screen PHQ 2 & 9 Depression Scale- Over the past 2 weeks, how often have you been bothered by any of the following problems? Little interest or pleasure in doing things: 0 Feeling down, depressed, or hopeless (PHQ Adolescent also includes...irritable): 0 PHQ-2 Total Score: 0 Trouble falling or staying asleep, or sleeping too much: 0 Feeling tired or having little energy: 0 Poor appetite or  overeating (PHQ Adolescent also includes...weight loss): 0 Feeling bad about yourself - or that you are a failure or have let yourself or your family down: 0 Trouble concentrating on things, such as reading the newspaper or watching television (PHQ Adolescent also includes...like school work): 0 Moving or speaking so slowly that other people could have noticed. Or the opposite - being so fidgety or restless that you have been moving around a lot more than usual: 0 Thoughts that you would be better off dead, or of hurting yourself in some way: 0 PHQ-9 Total Score: 0  If you checked off any problems, how difficult have these problems made it for you to do your work, take care of things at home, or get along with other people?: Not difficult at all  Depression Treatment Depression Interventions/Treatment : [id number] Score <4 Follow-up Not Indicated     Goals Addressed             This Visit's Progress    Patient Stated   On track    Eat healthier & lose some weight             Objective:    Today's Vitals   03/26/24 1535  BP: 125/60  Weight: 178 lb (80.7 kg)  Height: 5' 2 (1.575 m)   Body mass index is 32.56 kg/m.  Hearing/Vision screen Hearing Screening - Comments:: No difficulties Vision Screening - Comments:: Wears glasses. Sees Dr. Neptra  Immunizations and Health Maintenance Health Maintenance  Topic Date Due   DTaP/Tdap/Td (2 - Td or Tdap) 04/02/2023   COVID-19 Vaccine (7 - 2025-26 season) 10/29/2023   Medicare Annual Wellness (AWV)  12/06/2023   Diabetic kidney evaluation - eGFR measurement  05/10/2024   Diabetic kidney evaluation - Urine ACR  06/11/2024   HEMOGLOBIN A1C  06/11/2024   Mammogram  11/19/2024   OPHTHALMOLOGY EXAM  11/26/2024   FOOT EXAM  12/11/2024   Lung Cancer Screening  12/15/2024   Colonoscopy  01/16/2026   Pneumococcal Vaccine: 50+ Years  Completed   Influenza Vaccine  Completed   Bone Density Scan  Completed   Hepatitis C Screening   Completed   Zoster Vaccines- Shingrix   Completed   Meningococcal B Vaccine  Aged Out        Assessment/Plan:  This is a routine wellness examination for Cassidy Harrell.  Patient Care Team: Daryl Setter, NP as PCP - General (Internal Medicine) Carles Gear, MD as Consulting Physician (Neurosurgery)  I have personally reviewed and noted the following in the patients chart:   Medical and social history Use of alcohol, tobacco or illicit drugs  Current medications and supplements including opioid prescriptions. Functional ability and status Nutritional status Physical activity Advanced directives List of other physicians Hospitalizations, surgeries, and ER visits in previous 12 months Vitals Screenings to include cognitive, depression, and falls Referrals and appointments  No orders of the defined types were placed in this encounter.  In addition, I have reviewed and discussed with patient certain preventive protocols, quality metrics, and best practice recommendations. A written personalized care plan for preventive services as well as general preventive health recommendations were provided to patient.   Cassidy Harrell Right, [place]   03/26/2024   No follow-ups on file.  After Visit Summary: (MyChart) Due to this being a telephonic visit, the after visit summary with patients personalized plan was offered to patient via MyChart   Nurse Notes: no voiced concerns, declined vaccinations "

## 2024-04-16 ENCOUNTER — Ambulatory Visit: Admitting: Family
